# Patient Record
Sex: Female | Born: 1940
Health system: Southern US, Community
[De-identification: ages and names within clinical notes are randomized; demographics above are authoritative.]

## PROBLEM LIST (undated history)

## (undated) DIAGNOSIS — K219 Gastro-esophageal reflux disease without esophagitis: Secondary | ICD-10-CM

## (undated) DIAGNOSIS — E785 Hyperlipidemia, unspecified: Secondary | ICD-10-CM

## (undated) DIAGNOSIS — F32A Depression, unspecified: Secondary | ICD-10-CM

## (undated) DIAGNOSIS — D649 Anemia, unspecified: Secondary | ICD-10-CM

## (undated) DIAGNOSIS — M810 Age-related osteoporosis without current pathological fracture: Secondary | ICD-10-CM

## (undated) DIAGNOSIS — I10 Essential (primary) hypertension: Secondary | ICD-10-CM

## (undated) DIAGNOSIS — D689 Coagulation defect, unspecified: Secondary | ICD-10-CM

## (undated) DIAGNOSIS — R195 Other fecal abnormalities: Secondary | ICD-10-CM

## (undated) DIAGNOSIS — H269 Unspecified cataract: Secondary | ICD-10-CM

## (undated) DIAGNOSIS — I739 Peripheral vascular disease, unspecified: Secondary | ICD-10-CM

## (undated) DIAGNOSIS — F17200 Nicotine dependence, unspecified, uncomplicated: Secondary | ICD-10-CM

## (undated) DIAGNOSIS — F329 Major depressive disorder, single episode, unspecified: Secondary | ICD-10-CM

## (undated) HISTORY — DX: Other fecal abnormalities: R19.5

## (undated) HISTORY — DX: Depression, unspecified: F32.A

## (undated) HISTORY — DX: Anemia, unspecified: D64.9

## (undated) HISTORY — DX: Nicotine dependence, unspecified, uncomplicated: F17.200

## (undated) HISTORY — DX: Major depressive disorder, single episode, unspecified: F32.9

## (undated) HISTORY — DX: Coagulation defect, unspecified: D68.9

## (undated) HISTORY — DX: Gastro-esophageal reflux disease without esophagitis: K21.9

## (undated) HISTORY — DX: Peripheral vascular disease, unspecified: I73.9

## (undated) HISTORY — DX: Essential (primary) hypertension: I10

## (undated) HISTORY — DX: Age-related osteoporosis without current pathological fracture: M81.0

## (undated) HISTORY — DX: Hyperlipidemia, unspecified: E78.5

## (undated) HISTORY — DX: Unspecified cataract: H26.9

---
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
## 1977-04-24 HISTORY — PX: TUBAL LIGATION: SHX77

## 2009-04-04 ENCOUNTER — Emergency Department (HOSPITAL_COMMUNITY): Admission: EM | Admit: 2009-04-04 | Discharge: 2009-04-04 | Payer: Self-pay | Admitting: Emergency Medicine

## 2009-04-04 LAB — CONVERTED CEMR LAB
BUN: 12 mg/dL
Chloride: 96 meq/L
Glucose, Bld: 107 mg/dL
Potassium: 3.1 meq/L
Sodium: 133 meq/L

## 2009-04-14 ENCOUNTER — Ambulatory Visit: Payer: Self-pay | Admitting: Internal Medicine

## 2009-04-14 DIAGNOSIS — Z9189 Other specified personal risk factors, not elsewhere classified: Secondary | ICD-10-CM | POA: Insufficient documentation

## 2009-04-14 DIAGNOSIS — I10 Essential (primary) hypertension: Secondary | ICD-10-CM | POA: Insufficient documentation

## 2009-04-14 DIAGNOSIS — D649 Anemia, unspecified: Secondary | ICD-10-CM | POA: Insufficient documentation

## 2009-04-14 DIAGNOSIS — F17200 Nicotine dependence, unspecified, uncomplicated: Secondary | ICD-10-CM

## 2009-05-03 ENCOUNTER — Ambulatory Visit: Payer: Self-pay | Admitting: Internal Medicine

## 2009-05-03 DIAGNOSIS — E785 Hyperlipidemia, unspecified: Secondary | ICD-10-CM | POA: Insufficient documentation

## 2009-05-03 LAB — CONVERTED CEMR LAB
BUN: 8 mg/dL (ref 6–23)
CO2: 30 meq/L (ref 19–32)
Calcium: 9.1 mg/dL (ref 8.4–10.5)
Creatinine, Ser: 0.7 mg/dL (ref 0.4–1.2)
Direct LDL: 170.8 mg/dL
GFR calc non Af Amer: 87.75 mL/min (ref >60)
Glucose, Bld: 104 mg/dL — ABNORMAL HIGH (ref 70–99)
HDL: 53.9 mg/dL (ref >39.00)
Sodium: 136 meq/L (ref 135–145)
TSH: 1.43 microintl units/mL (ref 0.35–5.50)

## 2009-08-09 ENCOUNTER — Ambulatory Visit: Payer: Self-pay | Admitting: Internal Medicine

## 2009-08-09 LAB — CONVERTED CEMR LAB
ALT: 15 units/L (ref 0–35)
Albumin: 3.9 g/dL (ref 3.5–5.2)
Basophils Relative: 0.8 % (ref 0.0–3.0)
Bilirubin, Direct: 0.1 mg/dL (ref 0.0–0.3)
Cholesterol: 326 mg/dL — ABNORMAL HIGH (ref 0–200)
Eosinophils Absolute: 0.1 10*3/uL (ref 0.0–0.7)
HDL: 62.4 mg/dL (ref >39.00)
Hemoglobin: 12.9 g/dL (ref 12.0–15.0)
Lymphocytes Relative: 26.2 % (ref 12.0–46.0)
MCHC: 34.7 g/dL (ref 30.0–36.0)
Monocytes Relative: 6.2 % (ref 3.0–12.0)
Neutro Abs: 6.2 10*3/uL (ref 1.4–7.7)
Neutrophils Relative %: 66 % (ref 43.0–77.0)
RBC: 4.29 M/uL (ref 3.87–5.11)
Total Protein: 8.6 g/dL — ABNORMAL HIGH (ref 6.0–8.3)
Triglycerides: 107 mg/dL (ref 0.0–149.0)
VLDL: 21.4 mg/dL (ref 0.0–40.0)
WBC: 9.4 10*3/uL (ref 4.5–10.5)

## 2009-08-16 ENCOUNTER — Telehealth: Payer: Self-pay | Admitting: Internal Medicine

## 2009-11-08 ENCOUNTER — Ambulatory Visit: Payer: Self-pay | Admitting: Internal Medicine

## 2009-11-08 LAB — CONVERTED CEMR LAB
AST: 22 units/L (ref 0–37)
Albumin: 4 g/dL (ref 3.5–5.2)
Alkaline Phosphatase: 48 units/L (ref 39–117)
Cholesterol: 174 mg/dL (ref 0–200)
HDL: 59.6 mg/dL (ref >39.00)
LDL Cholesterol: 100 mg/dL — ABNORMAL HIGH (ref 0–99)
Total Protein: 7.7 g/dL (ref 6.0–8.3)
Triglycerides: 72 mg/dL (ref 0.0–149.0)

## 2009-12-13 ENCOUNTER — Telehealth: Payer: Self-pay | Admitting: Internal Medicine

## 2010-02-07 ENCOUNTER — Ambulatory Visit: Payer: Self-pay | Admitting: Internal Medicine

## 2010-02-07 LAB — CONVERTED CEMR LAB
Cholesterol: 189 mg/dL (ref 0–200)
Creatinine, Ser: 0.9 mg/dL (ref 0.4–1.2)
HDL: 73.7 mg/dL (ref >39.00)

## 2010-02-11 ENCOUNTER — Encounter: Payer: Self-pay | Admitting: Internal Medicine

## 2010-02-11 ENCOUNTER — Telehealth: Payer: Self-pay | Admitting: Internal Medicine

## 2010-03-01 ENCOUNTER — Ambulatory Visit: Payer: Self-pay

## 2010-03-01 ENCOUNTER — Encounter: Payer: Self-pay | Admitting: Internal Medicine

## 2010-03-08 ENCOUNTER — Ambulatory Visit: Payer: Self-pay | Admitting: Cardiovascular Disease

## 2010-03-08 ENCOUNTER — Encounter: Payer: Self-pay | Admitting: Cardiovascular Disease

## 2010-05-10 ENCOUNTER — Ambulatory Visit
Admission: RE | Admit: 2010-05-10 | Discharge: 2010-05-10 | Payer: Self-pay | Source: Home / Self Care | Attending: Internal Medicine | Admitting: Internal Medicine

## 2010-05-24 NOTE — Assessment & Plan Note (Signed)
Summary: pvd/pt had lea on 03-01-10/sl   Visit Type:  new pt visit Primary [facility] Will:  Newt Lukes MD   History of Present Illness: 70 yo WF with history of tobacco abuse, HTN and hyperlipidemia who is here today for evaluation of PAD. She was seen by Dr. Felicity Coyer and c/o pain in both legs with walking. The pain is described as a cramping in the calf muscles and goes away quickly with rest. She can walk for several blocks before her legs begin to ache. She has no rest pain or  ulcerations on her feet.   Current Medications (verified): 1)  Amlodipine Besylate 5 Mg Tabs (Amlodipine Besylate) .Marland Kitchen.. 1 By Mouth Once Daily 2)  Aspirin 81 Mg Tabs (Aspirin) .Marland Kitchen.. 1 By Mouth Once Daily 3)  Lipitor 20 Mg Tabs (Atorvastatin Calcium) .... Take 1 By Mouth Once Daily  Allergies (verified): No Known Drug Allergies  Past History:  Past Medical History: Reviewed history from 02/07/2010 and no changes required. 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         Bilateral leg pain with ambulation.   Vital Signs:  Patient profile:   70 year old female Height:      66 inches Weight:      96.12 pounds BMI:     15.57 Pulse rate:   93 / minute Pulse rhythm:   irregular BP sitting:   154 / 80  (left arm) Cuff size:   large  Vitals Entered By: Danielle Rankin, CMA (March 08, 2010 3:49 PM)  Physical Exam  General:  General: Well developed, well nourished, NAD HEENT: OP clear, mucus membranes moist SKIN: warm, dry Neuro: No focal deficits Musculoskeletal: Muscle strength 5/5 all ext Psychiatric: Mood and affect normal Neck: No JVD, no carotid bruits, no thyromegaly, no lymphadenopathy. Lungs:Clear bilaterally, no wheezes, rhonci, crackles CV: RRR no murmurs, gallops rubs Abdomen: soft, NT, ND, BS present Extremities: No edema, pulses trace right DP/PT, non-palpable left DP/PT.    EKG  Procedure date:  03/08/2010  Findings:      NSR, rate 90 bpm. Non-specific ST changes.   Arterial Doppler  Procedure date:  03/01/2010  Findings:      Right ABI 0.76. Right CFA, SFA and popliteal waveforms brisk and biphasic. There is moderate disease in the right mid SFA. PTA and peroneal  waveforms brisk and biphasic.   Left ABI 0.60. Left SFA occluded near ostium and reconstitutes distally via collaterals. Left PTA and peroneal waveforms damped and monophasic.   Impression & Recommendations:  Problem # 1:  PVD (ICD-443.9) Her non-invasive studies suggest chronic total occlusion of the left SFA with a long segment of total  occlusion, reconstitution above the knee. There is moderate disease in the right SFA. Her ABI are moderately reduced. I have discussed medical management with the patient and have offered a diagnostic angiogram. She will consder this. I have explained to the patient the options including PTA with stents versus atherectomy vs conservative  therapy. Will start Pletal 50 mg by mouth two times a day and continue ASA. She will call us after the new year to discuss arranging the procedure.   Other Orders: EKG w/ Interpretation (93000)  Patient Instructions: 1)  Your physician recommends that you schedule a follow-up appointment in: 6 months 2)  Your physician has recommended you make the following change in your medication: START PLETAL 50mg  by mouth two times a day. Prescriptions: PLETAL 50 MG TABS (CILOSTAZOL) take one tablet by mouth two times a day.  #60 x 8   Entered by:   Whitney Maeola Sarah RN   Authorized by:   Verne Carrow, MD   Signed by:   Ellender Hose RN on 03/08/2010   Method used:   Electronically to        [facility]. [phone]* (retail)       [address]       [place], [place]  [address]       Ph: [phone]       Fax: [phone]   RxID:   [id number]

## 2010-05-24 NOTE — Assessment & Plan Note (Signed)
Summary: 3 MOS F/U // CD   Vital Signs:  Patient profile:   70 year old female Height:      66 inches (167.64 cm) Weight:      96.12 pounds (43.69 kg) O2 Sat:      97 % on Room air Temp:     97.6 degrees F (36.44 degrees C) oral Pulse rate:   68 / minute BP sitting:   152 / 98  (left arm) Cuff size:   regular  Vitals Entered By: Orlan Leavens (August 09, 2009 11:01 AM)  O2 Flow:  Room air CC: 3 month follow-up Is Patient Diabetic? No Pain Assessment Patient in pain? no        Primary Care Provider:  Newt Lukes MD  CC:  3 month follow-up.  History of Present Illness: here for 3 month followup -  1) HTN - reports compliance with ongoing medical treatment and no changes in medication dose or frequency. denies adverse side effects related to current therapy.   2) dyslipidemia - never been told of this dx prior to this year - feels diet is already low fat, heart healthy- taking fish oil as rec.   3) smoking- understands need to quit and is working on same. does not feel meds or self-help classese would help, attributes cost of smoking as motivating factor in desire to quit    Clinical Review Panels:  Prevention   Last Mammogram:  normal (04/24/1978)   Last Pap Smear:  normal (04/24/1989)  Lipid Management   Cholesterol:  246 (05/03/2009)   HDL (good cholesterol):  53.90 (05/03/2009)  Complete Metabolic Panel   Glucose:  104 (05/03/2009)   Sodium:  136 (05/03/2009)   Potassium:  3.5 (05/03/2009)   Chloride:  95 (05/03/2009)   CO2:  30 (05/03/2009)   BUN:  8 (05/03/2009)   Creatinine:  0.7 (05/03/2009)   Calcium:  9.1 (05/03/2009)   Current Medications (verified): 1)  Lisinopril 20 Mg Tabs (Lisinopril) .Marland Kitchen.. 1 By Mouth Once Daily 2)  Amlodipine Besylate 5 Mg Tabs (Amlodipine Besylate) .Marland Kitchen.. 1 By Mouth Once Daily 3)  Toprol Xl 50 Mg Xr24h-Tab (Metoprolol Succinate) .Marland Kitchen.. 1 By Mouth Once Daily 4)  Fish Oil 1000 Mg Caps (Omega-3 Fatty Acids) .Marland Kitchen.. 1 By Mouth  Two Times A Day 5)  Aspirin 81 Mg Tabs (Aspirin) .Marland Kitchen.. 1 By Mouth Once Daily  Allergies (verified): No Known Drug Allergies  Past History:  Past Medical History: Anemia-NOS Hypertension dyslipidemia  Review of Systems  The patient denies weight loss, chest pain, syncope, dyspnea on exertion, peripheral edema, headaches, hemoptysis, and abdominal pain.    Physical Exam  General:  thin, alert, well-developed, well-nourished, and cooperative to examination.    Lungs:  normal respiratory effort, no intercostal retractions or use of accessory muscles; normal breath sounds bilaterally - no crackles and no wheezes.    Heart:  normal rate, regular rhythm, no murmur, and no rub. BLE without edema. Neurologic:  alert & oriented X3 and cranial nerves II-XII symetrically intact.  strength normal in all extremities, sensation intact to light touch, and gait normal. speech fluent without dysarthria or aphasia; follows commands with good comprehension.  Psych:  Oriented X3, memory intact for recent and remote, normally interactive, good eye contact, not anxious appearing, not depressed appearing, and not agitated.      Impression & Recommendations:  Problem # 1:  HYPERTENSION (ICD-401.9)  subop control - even home BP reviewed: SBP140-160s add diuretic to current regimen - new  e-rx for combo pill with ACEI Her updated medication list for this problem includes:    Lisinopril-hydrochlorothiazide 20-25 Mg Tabs (Lisinopril-hydrochlorothiazide) .Marland Kitchen... 1 by mouth once daily    Amlodipine Besylate 5 Mg Tabs (Amlodipine besylate) .Marland Kitchen... 1 by mouth once daily    Toprol Xl 50 Mg Xr24h-tab (Metoprolol succinate) .Marland Kitchen... 1 by mouth once daily  BP today: 152/98 Prior BP: 158/98 (05/03/2009)  Labs Reviewed: K+: 3.5 (05/03/2009) Creat: : 0.7 (05/03/2009)   Chol: 246 (05/03/2009)   HDL: 53.90 (05/03/2009)   TG: 85.0 (05/03/2009)  Orders: Prescription Created Electronically [phone]) Tobacco use cessation  intermediate 3-10 minutes ([address])  Problem # 2:  DYSLIPIDEMIA (ICD-272.4) labs reviewed -  given multitude of risk factos, feel pt would likely benefit from statin tx - check again now to compare to "pre omega 3" levels and add statin of still lev - same explained to pt who understands and agrees Orders: TLB-Lipid Panel (80061-LIPID) TLB-Hepatic/Liver Function Pnl (80076-HEPATIC) Tobacco use cessation intermediate 3-10 minutes (09811)    HDL:53.90 (05/03/2009)  Chol:246 (05/03/2009)  Trig:85.0 (05/03/2009)  Problem # 3:  SMOKER (ICD-305.1) 5 minutes today spent on patient education regarding the unhealthy effects of continued tobacco abuse and encouragment of cessation including medical options available to help patient to quit smoking.  Orders: TLB-Hepatic/Liver Function Pnl (80076-HEPATIC) Tobacco use cessation intermediate 3-10 minutes (91478)  Problem # 4:  ANEMIA-NOS (ICD-285.9) hx same - check CBC now, esp with smoking Orders: TLB-CBC Platelet - w/Differential (85025-CBCD) Tobacco use cessation intermediate 3-10 minutes (29562)  TSH: 1.43 (05/03/2009)  Complete Medication List: 1)  Lisinopril-hydrochlorothiazide 20-25 Mg Tabs (Lisinopril-hydrochlorothiazide) .Marland Kitchen.. 1 by mouth once daily 2)  Amlodipine Besylate 5 Mg Tabs (Amlodipine besylate) .Marland Kitchen.. 1 by mouth once daily 3)  Toprol Xl 50 Mg Xr24h-tab (Metoprolol succinate) .Marland Kitchen.. 1 by mouth once daily 4)  Fish Oil 1000 Mg Caps (Omega-3 fatty acids) .Marland Kitchen.. 1 by mouth two times a day 5)  Aspirin 81 Mg Tabs (Aspirin) .Marland Kitchen.. 1 by mouth once daily  Patient Instructions: 1)  it was good to see you today.  2)  test(s) ordered today - your results will be posted on the phone tree for review in 48-72 hours from the time of test completion; call [phone] and enter your 9 digit MRN (listed above on this page, just below your name); if any changes need to be made or there are abnormal results, you will be contacted directly.  3)  If  cholesterol still high, will consider adding cholesterol medication -  4)  will add diuretic to your blood pressure regimen for improved blood control - your prescriptions have been electronically submitted to your pharmacy. Please take as directed. Contact our office if you believe you're having problems with the medication(s).  5)  Please schedule a follow-up appointment in 3-4 months to recheck labs and blood pressure, sooner if problems.  6)  keep working on giving up cigarettes like you are doing Prescriptions: TOPROL XL 50 MG XR24H-TAB (METOPROLOL SUCCINATE) 1 by mouth once daily  #30 x 3   Entered and Authorized by:   Newt Lukes MD   Signed by:   Newt Lukes MD on 08/09/2009   Method used:   Electronically to        [facility]. [phone]* (retail)       [address]       [place], [place]  [address]  Ph: [phone]       Fax: [phone]   RxID:   [id number] AMLODIPINE BESYLATE 5 MG TABS (AMLODIPINE BESYLATE) 1 by mouth once daily  #30 x 3   Entered and Authorized by:   Newt Lukes MD   Signed by:   Newt Lukes MD on 08/09/2009   Method used:   Electronically to        [facility]. [phone]* (retail)       [address]       [place], [place]  [address]       Ph: [phone]       Fax: [phone]   RxID:   [id number] LISINOPRIL-HYDROCHLOROTHIAZIDE 20-25 MG TABS (LISINOPRIL-HYDROCHLOROTHIAZIDE) 1 by mouth once daily  #30 x 3   Entered and Authorized by:   Newt Lukes MD   Signed by:   Newt Lukes MD on 08/09/2009   Method used:   Electronically to        [facility]. [phone]* (retail)       [address]       [place], [place]  [address]       Ph: [phone]       Fax: [phone]   RxID:   [id number]

## 2010-05-24 NOTE — Assessment & Plan Note (Signed)
Summary: 3-4 MTH FU  STC   Vital Signs:  Patient profile:   70 year old female Height:      66 inches (167.64 cm) Weight:      98.4 pounds (44.73 kg) BMI:     15.94 O2 Sat:      98 % on Room air Temp:     97.8 degrees F (36.56 degrees C) oral Pulse rate:   118 / minute BP sitting:   132 / 90  (left arm) Cuff size:   regular  Vitals Entered By: Orlan Leavens RMA (February 07, 2010 10:47 AM)  O2 Flow:  Room air CC: 3 month follow-up Is Patient Diabetic? No Pain Assessment Patient in pain? no      Comments pt need clarification on meds. she states only been taking the lipitor & lisinopril. (Per phone note 12/13/09 was told to hold metorolo, & lisonpril)   Primary Care Provider:  Newt Lukes MD  CC:  3 month follow-up.  History of Present Illness: here for 3 month followup -  1) HTN - reports compliance with ongoing medical treatment and no changes in medication dose or frequency since last phone call (see EMR - holding lisiniproil/hct and toprol). denies adverse side effects related to current therapy.  no cp, no ha, no edema  2) dyslipidemia - reports compliance with ongoing medical treatment - statin began 07/2009 and no changes in medication dose or frequency. denies adverse side effects related to current therapy. - feels diet is already low fat, heart healthy- taking fish oil as rec.   3) smoking- understands need to quit and is working on same. does not feel meds or self-help classese would help, attributes cost of smoking as motivating factor in desire to quit  c/o claudication in both legs, "only when i walk fast" pain is described as "ache" - relieved with cessation of walking/rest ?attributes to lipitor    Clinical Review Panels:  Lipid Management   Cholesterol:  174 (11/08/2009)   LDL (bad choesterol):  100 (11/08/2009)   HDL (good cholesterol):  59.60 (11/08/2009)  CBC   WBC:  9.4 (08/09/2009)   RBC:  4.29 (08/09/2009)   Hgb:  12.9 (08/09/2009)  Hct:  37.1 (08/09/2009)   Platelets:  331.0 (08/09/2009)   MCV  86.4 (08/09/2009)   MCHC  34.7 (08/09/2009)   RDW  16.3 (08/09/2009)   PMN:  66.0 (08/09/2009)   Lymphs:  26.2 (08/09/2009)   Monos:  6.2 (08/09/2009)   Eosinophils:  0.8 (08/09/2009)   Basophil:  0.8 (08/09/2009)  Complete Metabolic Panel   Glucose:  104 (05/03/2009)   Sodium:  136 (05/03/2009)   Potassium:  3.5 (05/03/2009)   Chloride:  95 (05/03/2009)   CO2:  30 (05/03/2009)   BUN:  8 (05/03/2009)   Creatinine:  0.7 (05/03/2009)   Albumin:  4.0 (11/08/2009)   Total Protein:  7.7 (11/08/2009)   Calcium:  9.1 (05/03/2009)   Total Bili:  0.2 (11/08/2009)   Alk Phos:  48 (11/08/2009)   SGPT (ALT):  13 (11/08/2009)   SGOT (AST):  22 (11/08/2009)   Current Medications (verified): 1)  Lisinopril-Hydrochlorothiazide 20-25 Mg Tabs (Lisinopril-Hydrochlorothiazide) .Marland Kitchen.. 1 By Mouth Once Daily 2)  Amlodipine Besylate 5 Mg Tabs (Amlodipine Besylate) .Marland Kitchen.. 1 By Mouth Once Daily 3)  Toprol Xl 50 Mg Xr24h-Tab (Metoprolol Succinate) .Marland Kitchen.. 1 By Mouth Once Daily 4)  Fish Oil 1000 Mg Caps (Omega-3 Fatty Acids) .Marland Kitchen.. 1 By Mouth Two Times A Day 5)  Aspirin 81  Mg Tabs (Aspirin) .Marland Kitchen.. 1 By Mouth Once Daily 6)  Lipitor 20 Mg Tabs (Atorvastatin Calcium) .... 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         c/o claudication with exertion, bilateral legs  Physical Exam  General:  thin, alert, well-developed, well-nourished, and cooperative to examination.    Lungs:  normal respiratory effort, no intercostal retractions or use of accessory muscles; normal breath sounds bilaterally - no  crackles and no wheezes.    Heart:  normal rate, regular rhythm, no murmur, and no rub. BLE without edema.   Impression & Recommendations:  Problem # 1:  CLAUDICATION (ICD-443.9) multiple risks factors for PAD - check ABI now encouraged to cont BP and chol med and cont smoking cessation efforts! Orders: LE Arterial Doppler/ABI (Le arterial doppler) Tobacco use cessation intermediate 3-10 minutes ([address])  Problem # 2:  HYPERTENSION (ICD-401.9)  intol of Bbloc - fatigue and feels BP too low on combo tx - cont amlodipine and monitor The following medications were removed from the medication list:    Lisinopril-hydrochlorothiazide 20-25 Mg Tabs (Lisinopril-hydrochlorothiazide) .Marland Kitchen... 1 by mouth once daily    Toprol Xl 50 Mg Xr24h-tab (Metoprolol succinate) .Marland Kitchen... 1 by mouth once daily Her updated medication list for this problem includes:    Amlodipine Besylate 5 Mg Tabs (Amlodipine besylate) .Marland Kitchen... 1 by mouth once daily  Orders: LE Arterial Doppler/ABI (Le arterial doppler) TLB-Creatinine, Blood (82565-CREA)  BP today: 132/90 Prior BP: 150/82 (11/08/2009)  Labs Reviewed: K+: 3.5 (05/03/2009) Creat: : 0.7 (05/03/2009)   Chol: 174 (11/08/2009)   HDL: 59.60 (11/08/2009)   LDL: 100 (11/08/2009)   TG: 72.0 (11/08/2009)  Problem # 3:  DYSLIPIDEMIA (ICD-272.4)  Her updated medication list for this problem includes:    Lipitor 20 Mg Tabs (Atorvastatin calcium) .Marland Kitchen... Take 1 by mouth once daily  Orders: LE Arterial Doppler/ABI (Le arterial doppler) TLB-Lipid Panel (80061-LIPID)  begun on meds 08/16/09 - tol lipitor 20mg  once daily - recheck labs now - adjust if needed -   Labs Reviewed: SGOT: 21 (08/09/2009)   SGPT: 15 (08/09/2009)   HDL:62.40 (08/09/2009), 53.90 (05/03/2009)  Chol:326 (08/09/2009), 246 (05/03/2009)  Trig:107.0 (08/09/2009), 85.0 (05/03/2009)  Problem # 4:  SMOKER (ICD-305.1)  Orders: LE Arterial Doppler/ABI (Le arterial doppler) Tobacco use cessation  intermediate 3-10 minutes ([address]406)  5 minutes today spent on patient education regarding the unhealthy effects of continued tobacco abuse and encouragment of cessation including medical options available to help patient to quit smoking.   Complete Medication List: 1)  Amlodipine Besylate 5 Mg Tabs (Amlodipine besylate) .Marland Kitchen.. 1 by mouth once daily 2)  Fish Oil 1000 Mg Caps (Omega-3 fatty acids) .Marland Kitchen.. 1 by mouth two times a day 3)  Aspirin 81 Mg Tabs (Aspirin) .Marland Kitchen.. 1 by mouth once daily 4)  Lipitor 20 Mg Tabs (Atorvastatin calcium) .... Take 1 by mouth once daily  Patient Instructions: 1)  it was good to see you today.  2)  test(s) ordered today - your results will be called to you in 48-72 hours re: any changes need to be made or there are abnormal results, you will be contacted directly.  3)  we'll make referral  for ABI to look at blood flow in your legs. Our office will contact you regarding this appointment once made.  4)  Please schedule a follow-up appointment in 3 months to recheck blood pressure, call sooner if problems.  5)  keep working on giving up cigarettes like you are doing! Prescriptions: AMLODIPINE BESYLATE 5 MG TABS (AMLODIPINE BESYLATE) 1 by mouth once daily  #30 x 6   Entered and Authorized by:   Newt Lukes MD   Signed by:   Newt Lukes MD on 02/07/2010   Method used:   Electronically to        [facility]. [phone]* (retail)       [address]       [place], [place]  [address]       Ph: [phone]       Fax: [phone]   RxID:   [id number]    Orders Added: 1)  LE Arterial Doppler/ABI [Le arterial doppler] 2)  Est. Patient Level IV [44010] 3)  TLB-Lipid Panel [80061-LIPID] 4)  TLB-Creatinine, Blood [82565-CREA] 5)  Tobacco use cessation intermediate 3-10 minutes [99406]

## 2010-05-24 NOTE — Progress Notes (Signed)
Summary: LE study ?  Phone Note Call from Patient Call back at [facility] Phone [phone]   Summary of Call: pt request call back re: appt on tues.  She states she had oral surgery on Wednesday. She states she is in pain and on pain meds but she feels real spaced out and is unsure if she can keep the appt on Tues for  LE study.  She wants to know if Dr. Felicity Coyer thinks it can wait? Initial call taken by: Lanier Prude, [facility]),  February 11, 2010 2:46 PM  Follow-up for Phone Call        we can reschedule ABI for following week if needed - just coordinate with LeB vasc lab on this - thanks Follow-up by: Newt Lukes MD,  February 11, 2010 5:17 PM  Additional Follow-up for Phone Call Additional follow up Details #1::        pt informed and I gave her number to Cardiology to R/S appt. Additional Follow-up by: Lanier Prude, [facility]),  February 11, 2010 5:37 PM

## 2010-05-24 NOTE — Progress Notes (Signed)
Summary: Med Se?  Phone Note Call from Patient Call back at Home Phone [phone]   Caller: Patient Summary of Call: pt called stating that she has been experiencing lightheadedness since starting new medications. please advise Initial call taken by: Margaret Pyle, CMA,  August 16, 2009 1:20 PM  Follow-up for Phone Call        lipitor does not usually cause this SE, but pt may cut pill in half for 1 week then resume whole pill to see if this helps her symptoms - thanks Follow-up by: Newt Lukes MD,  August 16, 2009 2:08 PM  Additional Follow-up for Phone Call Additional follow up Details #1::        pt informed, will call back in one week Additional Follow-up by: Margaret Pyle, CMA,  August 16, 2009 3:56 PM

## 2010-05-24 NOTE — Assessment & Plan Note (Signed)
Summary: 2 - 3 wk f/u #/cd   Vital Signs:  Patient profile:   70 year old female Height:      66 inches (167.64 cm) Weight:      92.0 pounds (41.82 kg) O2 Sat:      97 % on Room air Temp:     97.0 degrees F (36.11 degrees C) oral Pulse rate:   80 / minute BP sitting:   158 / 98  (left arm) Cuff size:   regular  Vitals Entered By: Orlan Leavens (May 03, 2009 10:58 AM)  O2 Flow:  Room air CC: 3 week f/u Is Patient Diabetic? No Pain Assessment Patient in pain? no        Primary Care Provider:  Newt Lukes MD  CC:  3 week f/u.  History of Present Illness: HTN f/u - reports compliance with ongoing medical treatment and no changes in medication dose or frequency. denies adverse side effects related to current therapy.   Current Medications (verified): 1)  Lisinopril 10 Mg Tabs (Lisinopril) .... Take 1 By Mouth Qd 2)  Amlodipine Besylate 5 Mg Tabs (Amlodipine Besylate) .Marland Kitchen.. 1 By Mouth Once Daily 3)  Toprol Xl 50 Mg Xr24h-Tab (Metoprolol Succinate) .Marland Kitchen.. 1 By Mouth Once Daily  Allergies (verified): No Known Drug Allergies  Past History:  Past Medical History: Last updated: 04/14/2009 Anemia-NOS Hypertension  Physical Exam  General:  thin, alert, well-developed, well-nourished, and cooperative to examination.    Lungs:  normal respiratory effort, no intercostal retractions or use of accessory muscles; normal breath sounds bilaterally - no crackles and no wheezes.    Heart:  normal rate, regular rhythm, no murmur, and no rub. BLE without edema.   Impression & Recommendations:  Problem # 1:  HYPERTENSION (ICD-401.9) improved but not yet at goal - inc ACEI dose (keep other same d/t risk edema or fatigue) check labs - recheck 3mos Her updated medication list for this problem includes:    Lisinopril 20 Mg Tabs (Lisinopril) .Marland Kitchen... 1 by mouth once daily    Amlodipine Besylate 5 Mg Tabs (Amlodipine besylate) .Marland Kitchen... 1 by mouth once daily    Toprol Xl 50 Mg  Xr24h-tab (Metoprolol succinate) .Marland Kitchen... 1 by mouth once daily  Orders: TLB-BMP (Basic Metabolic Panel-BMET) (80048-METABOL) TLB-Lipid Panel (80061-LIPID) TLB-TSH (Thyroid Stimulating Hormone) (16109-UEA) Prescription Created Electronically [phone])  BP today: 158/98 Prior BP: 190/130 (04/14/2009)  Labs Reviewed: K+: 3.1 (04/04/2009) Creat: : 0.77 (04/04/2009)     Complete Medication List: 1)  Lisinopril 20 Mg Tabs (Lisinopril) .Marland Kitchen.. 1 by mouth once daily 2)  Amlodipine Besylate 5 Mg Tabs (Amlodipine besylate) .Marland Kitchen.. 1 by mouth once daily 3)  Toprol Xl 50 Mg Xr24h-tab (Metoprolol succinate) .Marland Kitchen.. 1 by mouth once daily  Patient Instructions: 1)  blood pressure is improved but not yet at goal - 2)  will increase your lisinopril dose to 20mg  once daily - other medications to remain the same - 3)  your new prescription has been electronically submitted to your pharmacy. Please take as directed. Contact our office if you believe you're having problems with the medication(s).  4)  test(s) ordered today - your results will be posted on the phone tree for review in 48-72 hours from the time of test completion; call [phone] and enter your 9 digit MRN (listed above on this page, just below your name); if any changes need to be made or there are abnormal results, you will be contacted directly.  5)  Please schedule a follow-up appointment  in 3months, sooner if problems.  Prescriptions: LISINOPRIL 20 MG TABS (LISINOPRIL) 1 by mouth once daily  #30 x 3   Entered and Authorized by:   Newt Lukes MD   Signed by:   Newt Lukes MD on 05/03/2009   Method used:   Electronically to        [facility]. [phone]* (retail)       [address]       [place], [place]  [address]       Ph: [phone]       Fax: [phone]   RxID:   [id number]

## 2010-05-24 NOTE — Miscellaneous (Signed)
Summary: Orders Update  Clinical Lists Changes  Orders: Added new Test order of Arterial Duplex Lower Extremity (Arterial Duplex Low) - Signed 

## 2010-05-24 NOTE — Assessment & Plan Note (Signed)
Summary: 3 mos f/u // # / cd   Vital Signs:  Patient profile:   70 year old female Height:      66 inches (167.64 cm) Weight:      96.0 pounds (43.64 kg) O2 Sat:      92 % on Room air Temp:     98.6 degrees F (37.00 degrees C) oral Pulse rate:   82 / minute BP sitting:   150 / 82  (left arm) Cuff size:   regular  Vitals Entered By: Orlan Leavens (November 08, 2009 10:54 AM)  O2 Flow:  Room air CC: 3 month follow-up Is Patient Diabetic? No Pain Assessment Patient in pain? no      Comments Need refills on meds. also pt states she only takes 1/2 lipitor   Primary Care Provider:  Newt Lukes MD  CC:  3 month follow-up.  History of Present Illness: here for 3 month followup -  1) HTN - reports compliance with ongoing medical treatment and no changes in medication dose or frequency. denies adverse side effects related to current therapy.  no cp, no ha, no edema  2) dyslipidemia - reports compliance with ongoing medical treatment began 07/2009 and no changes in medication dose or frequency. denies adverse side effects related to current therapy. - feels diet is already low fat, heart healthy- taking fish oil as rec.   3) smoking- understands need to quit and is working on same. does not feel meds or self-help classese would help, attributes cost of smoking as motivating factor in desire to quit    Allergies: No Known Drug Allergies  Past History:  Past Medical History: Anemia-NOS Hypertension dyslipidemia  Review of Systems  The patient denies fever, weight loss, muscle weakness, and difficulty walking.    Physical Exam  General:  thin, alert, well-developed, well-nourished, and cooperative to examination.    Lungs:  normal respiratory effort, no intercostal retractions or use of accessory muscles; normal breath sounds bilaterally - no crackles and no wheezes.    Heart:  normal rate, regular rhythm, no murmur, and no rub. BLE without edema. Msk:  No deformity or  scoliosis noted of thoracic or lumbar spine.     Impression & Recommendations:  Problem # 1:  DYSLIPIDEMIA (ICD-272.4)  begun on meds last OV 3 months ago 08/16/09 - tol lipitor 20mg  once daily - recheck labs now - adjust if needed - ?inc dose vs change to crestor -- await lab results Her updated medication list for this problem includes:    Lipitor 40 Mg Tabs (Atorvastatin calcium) .Marland Kitchen... 1/2 by mouth at bedtime  Orders: TLB-Lipid Panel (80061-LIPID)  Labs Reviewed: SGOT: 21 (08/09/2009)   SGPT: 15 (08/09/2009)   HDL:62.40 (08/09/2009), 53.90 (05/03/2009)  Chol:326 (08/09/2009), 246 (05/03/2009)  Trig:107.0 (08/09/2009), 85.0 (05/03/2009)  Problem # 2:  HYPERTENSION (ICD-401.9) Assessment: Unchanged  Her updated medication list for this problem includes:    Lisinopril-hydrochlorothiazide 20-25 Mg Tabs (Lisinopril-hydrochlorothiazide) .Marland Kitchen... 1 by mouth once daily    Amlodipine Besylate 5 Mg Tabs (Amlodipine besylate) .Marland Kitchen... 1 by mouth once daily    Toprol Xl 50 Mg Xr24h-tab (Metoprolol succinate) .Marland Kitchen... 1 by mouth once daily  BP today: 150/82 Prior BP: 152/98 (08/09/2009)  Labs Reviewed: K+: 3.5 (05/03/2009) Creat: : 0.7 (05/03/2009)   Chol: 326 (08/09/2009)   HDL: 62.40 (08/09/2009)   TG: 107.0 (08/09/2009)  Problem # 3:  SMOKER (ICD-305.1)  Complete Medication List: 1)  Lisinopril-hydrochlorothiazide 20-25 Mg Tabs (Lisinopril-hydrochlorothiazide) .Marland Kitchen.. 1 by mouth  once daily 2)  Amlodipine Besylate 5 Mg Tabs (Amlodipine besylate) .Marland Kitchen.. 1 by mouth once daily 3)  Toprol Xl 50 Mg Xr24h-tab (Metoprolol succinate) .Marland Kitchen.. 1 by mouth once daily 4)  Fish Oil 1000 Mg Caps (Omega-3 fatty acids) .Marland Kitchen.. 1 by mouth two times a day 5)  Aspirin 81 Mg Tabs (Aspirin) .Marland Kitchen.. 1 by mouth once daily 6)  Lipitor 40 Mg Tabs (Atorvastatin calcium) .... 1/2 by mouth at bedtime  Other Orders: TLB-Hepatic/Liver Function Pnl (80076-HEPATIC)  Patient Instructions: 1)  it was good to see you today.  2)   test(s) ordered today - your results will be called to you in 48-72 hours re: any changes need to be made or there are abnormal results, you will be contacted directly.  3)  If cholesterol still high, will consider changing or increasing dose of cholesterol medication -   4)  Please schedule a follow-up appointment in 3-4 months to recheck labs and blood pressure, sooner if problems.  5)  keep working on giving up cigarettes like you are doing

## 2010-05-24 NOTE — Progress Notes (Signed)
Summary: BP?  Phone Note Call from Patient Call back at Home Phone [phone]   Caller: Patient Summary of Call: Pt called stating that she experienced low BP Friday evening 12/10/09 (80/40) that caused her to feel dizzy. Pt states that she stopped taking Lisinopril, Metoprolol and Amlodipine. Pt says that since she stopped taking medications she feels much better, dizziness has resolved but her BP this morning was elevated at 145/90. Pt is requesting MD advisement on which medicine may have caused symptoms and which one should she re-start, please advise. Initial call taken by: Margaret Pyle, CMA,  December 13, 2009 9:08 AM  Follow-up for Phone Call        rec resume amlodipine, hold metoprolol as Bbloc more likely to cause her symptoms; also hold lisinopril for now - call if SBP>130s - thanks Follow-up by: Newt Lukes MD,  December 13, 2009 12:05 PM  Additional Follow-up for Phone Call Additional follow up Details #1::        Pt informed Additional Follow-up by: Margaret Pyle, CMA,  December 13, 2009 1:34 PM

## 2010-05-26 NOTE — Assessment & Plan Note (Signed)
Summary: 3 MONTH FOLLOW UP-LB   Vital Signs:  Patient profile:   70 year old female Height:      66 inches (167.64 cm) Weight:      96.8 pounds (44.00 kg) O2 Sat:      98 % on Room air Temp:     97.3 degrees F (36.28 degrees C) oral Pulse rate:   107 / minute BP sitting:   148 / 92  (left arm) Cuff size:   regular  Vitals Entered By: Orlan Leavens RMA (May 10, 2010 11:00 AM)  O2 Flow:  Room air CC: 3 month follow-up Is Patient Diabetic? No Pain Assessment Patient in pain? no        Primary Care Provider:  Newt Lukes MD  CC:  3 month follow-up.  History of Present Illness: here for 3 month followup -  1) HTN - reports compliance with ongoing medical treatment and no changes in medication dose or frequency since last phone call (see EMR - holding lisiniproil/hct and toprol). denies adverse side effects related to current therapy.  no cp, no ha, no edema  2) dyslipidemia - reports compliance with ongoing medical treatment - statin began 07/2009 and no changes in medication dose or frequency. denies adverse side effects related to current therapy. - feels diet is already low fat, heart healthy- taking fish oil as rec.   3) smoking- understands need to quit and is working on same. does not feel meds or self-help classese would help, attributes cost of smoking as motivating factor in desire to quit - down to 4 cig/d  4) PAD/PVD - abn ABI 02/2010 - seen by periph cards who rec intervention but pt wanted to try med tx 1st - on pletal; much improved but continued claudication in both legs "only when i walk fast" -pain relieved with cessation of walking/rest  Clinical Review Panels:  Lipid Management   Cholesterol:  189 (02/07/2010)   LDL (bad choesterol):  97 (02/07/2010)   HDL (good cholesterol):  73.70 (02/07/2010)  CBC   WBC:  9.4 (08/09/2009)   RBC:  4.29 (08/09/2009)   Hgb:  12.9 (08/09/2009)   Hct:  37.1 (08/09/2009)   Platelets:  331.0 (08/09/2009)   MCV   86.4 (08/09/2009)   MCHC  34.7 (08/09/2009)   RDW  16.3 (08/09/2009)   PMN:  66.0 (08/09/2009)   Lymphs:  26.2 (08/09/2009)   Monos:  6.2 (08/09/2009)   Eosinophils:  0.8 (08/09/2009)   Basophil:  0.8 (08/09/2009)  Complete Metabolic Panel   Glucose:  104 (05/03/2009)   Sodium:  136 (05/03/2009)   Potassium:  3.5 (05/03/2009)   Chloride:  95 (05/03/2009)   CO2:  30 (05/03/2009)   BUN:  8 (05/03/2009)   Creatinine:  0.9 (02/07/2010)   Albumin:  4.0 (11/08/2009)   Total Protein:  7.7 (11/08/2009)   Calcium:  9.1 (05/03/2009)   Total Bili:  0.2 (11/08/2009)   Alk Phos:  48 (11/08/2009)   SGPT (ALT):  13 (11/08/2009)   SGOT (AST):  22 (11/08/2009)   Current Medications (verified): 1)  Amlodipine Besylate 5 Mg Tabs (Amlodipine Besylate) .Marland Kitchen.. 1 By Mouth Once Daily 2)  Aspirin 81 Mg Tabs (Aspirin) .Marland Kitchen.. 1 By Mouth Once Daily 3)  Lipitor 20 Mg Tabs (Atorvastatin Calcium) .... Take 1 By Mouth Once Daily 4)  Pletal 50 Mg Tabs (Cilostazol) .... Take One Tablet By Mouth Two Times A Day.  Allergies (verified): No Known Drug Allergies  Past History:  Past Medical  History: Anemia-NOS   Hypertension dyslipidemia PVD/PAD - ABI 02/2010: R 0.76, L 0.60  MD roster: card - mcalhany  Review of Systems  The patient denies weight loss, chest pain, headaches, and abdominal pain.    Physical Exam  General:  thin, alert, well-developed, well-nourished, and cooperative to examination.    Lungs:  normal respiratory effort, no intercostal retractions or use of accessory muscles; normal breath sounds bilaterally - no crackles and no wheezes.    Heart:  normal rate, regular rhythm, no murmur, and no rub. BLE without edema. Psych:  Oriented X3, memory intact for recent and remote, normally interactive, good eye contact, not anxious appearing, not depressed appearing, and not agitated.      Impression & Recommendations:  Problem # 1:  PVD (ICD-443.9) seen by mcalhany 02/2010 for same - ABI  03/01/10 reviewed - considering intervention if needed but improved pain symptoms on pletal  cont same and recheck ABI in 08/2009, f/u cards as planned work on risk factor modification  Problem # 2:  DYSLIPIDEMIA (ICD-272.4) intol of 40mg  due to myalgia in past cont 20mg  lipitor for now as tol well Her updated medication list for this problem includes:    Lipitor 20 Mg Tabs (Atorvastatin calcium) .Marland Kitchen... Take 1 by mouth once daily  Labs Reviewed: SGOT: 22 (11/08/2009)   SGPT: 13 (11/08/2009)   HDL:73.70 (02/07/2010), 59.60 (11/08/2009)  LDL:97 (02/07/2010), 100 (16/01/9603)  Chol:189 (02/07/2010), 174 (11/08/2009)  Trig:91.0 (02/07/2010), 72.0 (11/08/2009)  Problem # 3:  HYPERTENSION (ICD-401.9)  add ARB to current meds - prior Bbloc caused fatigue Her updated medication list for this problem includes:    Amlodipine Besylate 5 Mg Tabs (Amlodipine besylate) .Marland Kitchen... 1 by mouth once daily    Losartan Potassium 50 Mg Tabs (Losartan potassium) .Marland Kitchen... 1 by mouth once daily  BP today: 148/92 Prior BP: 154/80 (03/08/2010)  Labs Reviewed: K+: 3.5 (05/03/2009) Creat: : 0.9 (02/07/2010)   Chol: 189 (02/07/2010)   HDL: 73.70 (02/07/2010)   LDL: 97 (02/07/2010)   TG: 91.0 (02/07/2010)  Orders: Prescription Created Electronically [phone])  Problem # 4:  SMOKER (ICD-305.1) 5 minutes today spent on patient education regarding the unhealthy effects of continued tobacco abuse and encouragment of cessation including medical options available to help patient to quit smoking.   Complete Medication List: 1)  Amlodipine Besylate 5 Mg Tabs (Amlodipine besylate) .Marland Kitchen.. 1 by mouth once daily 2)  Aspirin 81 Mg Tabs (Aspirin) .Marland Kitchen.. 1 by mouth once daily 3)  Lipitor 20 Mg Tabs (Atorvastatin calcium) .... Take 1 by mouth once daily 4)  Pletal 50 Mg Tabs (Cilostazol) .... Take one tablet by mouth two times a day. 5)  Losartan Potassium 50 Mg Tabs (Losartan potassium) .Marland Kitchen.. 1 by mouth once daily  Patient  Instructions: 1)  it was good to see you today.  2)  start losartan in additon to amlodipine for blood pressure - your prescription has been electronically submitted to your pharmacy. Please take as directed. Contact our office if you believe you're having problems with the medication(s).  3)  Plan followup ABI in 6 months to look at blood flow in your legs. Schedule with Dr. Clifton James as discussed 4)  Please schedule a follow-up appointment in 3 months to recheck blood pressure, call sooner if problems. will check cholesterol next visit 5)  keep working on giving up cigarettes like you are doing! Good job Prescriptions: LOSARTAN POTASSIUM 50 MG TABS (LOSARTAN POTASSIUM) 1 by mouth once daily  #30 x 6  Entered and Authorized by:   Newt Lukes MD   Signed by:   Newt Lukes MD on 05/10/2010   Method used:   Electronically to        [facility]. [phone]* (retail)       [address]       [place], [place]  [address]       Ph: [phone]       Fax: [phone]   RxID:   [id number]    Orders Added: 1)  Prescription Created Electronically [[id number]] 2)  Est. Patient Level IV [44010]

## 2010-07-13 ENCOUNTER — Encounter: Payer: Self-pay | Admitting: *Deleted

## 2010-07-26 LAB — BASIC METABOLIC PANEL
Calcium: 9.1 mg/dL (ref 8.4–10.5)
GFR calc Af Amer: >60 mL/min (ref >60)
GFR calc non Af Amer: >60 mL/min (ref >60)
Glucose, Bld: 107 mg/dL — ABNORMAL HIGH (ref 70–99)
Potassium: 3.1 mEq/L — ABNORMAL LOW (ref 3.5–5.1)
Sodium: 133 mEq/L — ABNORMAL LOW (ref 135–145)

## 2010-08-09 ENCOUNTER — Ambulatory Visit: Payer: Self-pay | Admitting: Internal Medicine

## 2010-08-15 ENCOUNTER — Telehealth: Payer: Self-pay

## 2010-08-15 ENCOUNTER — Ambulatory Visit (INDEPENDENT_AMBULATORY_CARE_PROVIDER_SITE_OTHER): Payer: Medicare Other | Admitting: Internal Medicine

## 2010-08-15 ENCOUNTER — Encounter: Payer: Self-pay | Admitting: Internal Medicine

## 2010-08-15 ENCOUNTER — Other Ambulatory Visit (INDEPENDENT_AMBULATORY_CARE_PROVIDER_SITE_OTHER): Payer: Medicare Other

## 2010-08-15 DIAGNOSIS — R000 Tachycardia, unspecified: Secondary | ICD-10-CM

## 2010-08-15 DIAGNOSIS — I10 Essential (primary) hypertension: Secondary | ICD-10-CM

## 2010-08-15 DIAGNOSIS — E785 Hyperlipidemia, unspecified: Secondary | ICD-10-CM

## 2010-08-15 DIAGNOSIS — I739 Peripheral vascular disease, unspecified: Secondary | ICD-10-CM

## 2010-08-15 LAB — CBC WITH DIFFERENTIAL/PLATELET
Basophils Absolute: 0.1 10*3/uL (ref 0.0–0.1)
Basophils Relative: 1.2 % (ref 0.0–3.0)
Eosinophils Absolute: 0.1 10*3/uL (ref 0.0–0.7)
Eosinophils Relative: 0.7 % (ref 0.0–5.0)
HCT: 36.7 % (ref 36.0–46.0)
Hemoglobin: 12.5 g/dL (ref 12.0–15.0)
MCV: 89 fl (ref 78.0–100.0)
Monocytes Relative: 7.7 % (ref 3.0–12.0)
Platelets: 300 10*3/uL (ref 150.0–400.0)
RBC: 4.12 Mil/uL (ref 3.87–5.11)
WBC: 8.3 10*3/uL (ref 4.5–10.5)

## 2010-08-15 LAB — TSH: TSH: 1.84 u[IU]/mL (ref 0.35–5.50)

## 2010-08-15 MED ORDER — HYDROCHLOROTHIAZIDE 12.5 MG PO TABS: 12.5000 mg | ORAL_TABLET | Freq: Every day | ORAL | Status: DC

## 2010-08-15 NOTE — Assessment & Plan Note (Signed)
Seen by mcalhany 02/2010 for same -  ABI 03/01/10 reviewed - pt considering intervention if needed but improved pain symptoms on pletal  cont same and recheck ABI in 08/2009 and cards as planned Continue to work on risk factor modification

## 2010-08-15 NOTE — Assessment & Plan Note (Signed)
On statin since 07/2009 - The current medical regimen is effective;  continue present plan and medications.

## 2010-08-15 NOTE — Assessment & Plan Note (Signed)
Home log reviewed - largely uncontrolled - Intolerant of ACEI due to cough, intolerant of lopressor due to fatigue Add HCTZ to current medications and titrate as needed

## 2010-08-15 NOTE — Telephone Encounter (Signed)
Please call patient - normal lab results. No medication changes recommended. Thanks.   Lab Results  Component Value Date   WBC 8.3 08/15/2010   HGB 12.5 08/15/2010   HCT 36.7 08/15/2010   MCV 89.0 08/15/2010   PLT 300.0 08/15/2010   Lab Results  Component Value Date   TSH 1.84 08/15/2010   Also agree with rx'd HCTZ as tab so pt could take 1/2 tab if needed - to return drug to pharmacy as instructed by triage - thanks

## 2010-08-15 NOTE — Progress Notes (Signed)
  Subjective:    Patient ID: Jennifer [name], female    DOB: 1940/08/06, 70 y.o.   MRN: [id number]  HPI Here for follow up - reviewed chronic medical issues:  HTN - reports compliance with ongoing medical treatment and no changes in medication dose or frequency since last phone call (see EMR - holding lisinipril/hct and toprol). denies adverse side effects related to current therapy.  no cp, no ha, no edema  dyslipidemia - reports compliance with ongoing medical treatment - statin began 07/2009 and no changes in medication dose or frequency. denies adverse side effects related to current therapy. - feels diet is already low fat, heart healthy- taking fish oil as rec.   smoking- understands need to quit and is working on same. does not feel meds or self-help classese would help, attributes cost of smoking as motivating factor in desire to quit - down to 4 cig/d  PAD/PVD - abn ABI 02/2010 - seen by periph cards who rec intervention but pt wanted to try med tx 1st - on pletal; much improved but continued claudication in both legs "only when i walk fast" -pain relieved with cessation of walking/rest  Past Medical History  Diagnosis Date  . SMOKER   . Peripheral vascular disease, unspecified   . ANEMIA-NOS   . DYSLIPIDEMIA   . HYPERTENSION    Review of Systems  Constitutional: Negative for unexpected weight change.  Respiratory: Negative for shortness of breath.   Cardiovascular: Negative for chest pain.  Neurological: Negative for headaches.       Objective:   Physical Exam  Constitutional: She is oriented to person, place, and time. She appears well-developed and well-nourished. No distress.       Thin  Neck: Normal range of motion. Neck supple. No thyromegaly present.  Cardiovascular: Normal rate, regular rhythm and normal heart sounds.   Pulmonary/Chest: Effort normal and breath sounds normal. No respiratory distress. She has no wheezes.  Neurological: She is alert and oriented to  person, place, and time. No cranial nerve deficit.  Psychiatric: She has a normal mood and affect. Her behavior is normal.      Lab Results  Component Value Date   WBC 9.4 08/09/2009   HGB 12.9 08/09/2009   HCT 37.1 08/09/2009   PLT 331.0 08/09/2009   CHOL 189 02/07/2010   TRIG 91.0 02/07/2010   HDL 73.70 02/07/2010   LDLDIRECT 232.1 08/09/2009   ALT 13 11/08/2009   AST 22 11/08/2009   NA 136 05/03/2009   K 3.5 05/03/2009   CL 95* 05/03/2009   CREATININE 0.9 02/07/2010   BUN 8 05/03/2009   CO2 30 05/03/2009   TSH 1.43 05/03/2009     Assessment & Plan:  See problem list. Medications and labs reviewed today.

## 2010-08-15 NOTE — Telephone Encounter (Signed)
Pt called stating that she was Rx'd 1/2 tab of

## 2010-08-15 NOTE — Telephone Encounter (Signed)
Notified pt with results.Marland KitchenMarland Kitchen4/23/12@6 :36pm/LMB

## 2010-08-15 NOTE — Patient Instructions (Signed)
It was good to see you today. Medications reviewed, will make the following changes at this time. Add HCTZ for blood pressure as directed to current medications Your prescription(s) have been submitted to your pharmacy. Please take as directed and contact our office if you believe you are having problem(s) with the medication(s). Test(s) ordered today. Your results will be called to you after review (48-72hours after test completion). If any changes need to be made, you will be notified at that time. Please schedule followup in 3-4 months for blood pressure and medication review, call sooner if problems. Continue to work on giving up those last cigarettes!!!

## 2010-08-15 NOTE — Assessment & Plan Note (Signed)
Check labs now rule out anemia or thyroid issues - intolerant of Bbloc due to fatigue

## 2010-08-29 ENCOUNTER — Other Ambulatory Visit: Payer: Self-pay | Admitting: Internal Medicine

## 2010-09-04 ENCOUNTER — Other Ambulatory Visit: Payer: Self-pay | Admitting: Internal Medicine

## 2010-09-16 ENCOUNTER — Other Ambulatory Visit: Payer: Self-pay | Admitting: Internal Medicine

## 2010-09-16 DIAGNOSIS — I739 Peripheral vascular disease, unspecified: Secondary | ICD-10-CM

## 2010-09-20 ENCOUNTER — Encounter (INDEPENDENT_AMBULATORY_CARE_PROVIDER_SITE_OTHER): Payer: Medicare Other | Admitting: *Deleted

## 2010-09-20 ENCOUNTER — Other Ambulatory Visit: Payer: Self-pay | Admitting: *Deleted

## 2010-09-20 DIAGNOSIS — I739 Peripheral vascular disease, unspecified: Secondary | ICD-10-CM

## 2010-09-23 ENCOUNTER — Encounter: Payer: Self-pay | Admitting: Internal Medicine

## 2010-10-07 ENCOUNTER — Encounter: Payer: Self-pay | Admitting: Cardiovascular Disease

## 2010-10-07 ENCOUNTER — Ambulatory Visit (INDEPENDENT_AMBULATORY_CARE_PROVIDER_SITE_OTHER): Payer: Medicare Other | Admitting: Cardiovascular Disease

## 2010-10-07 VITALS — BP 155/90 | HR 93 | Ht 66.0 in | Wt 100.0 lb

## 2010-10-07 DIAGNOSIS — I739 Peripheral vascular disease, unspecified: Secondary | ICD-10-CM

## 2010-10-07 DIAGNOSIS — F17200 Nicotine dependence, unspecified, uncomplicated: Secondary | ICD-10-CM

## 2010-10-07 NOTE — Assessment & Plan Note (Signed)
Stable. Continue ASA and Pletal. Continue walking. Complete smoking cessation encouraged. She wishes to stop.

## 2010-10-07 NOTE — Assessment & Plan Note (Signed)
Pt wishes to stop smoking.

## 2010-10-07 NOTE — Progress Notes (Signed)
History of Present Illness :70 yo WF with history of PAD,  tobacco abuse, HTN and hyperlipidemia who is here today for PV follow up. I saw her as a new pt in November 2011 for evaluation of PAD. She had c/o pain in both legs with walking at that time. The pain was described as a cramping in the calf muscles and goes away quickly with rest. She had no rest pain or  ulcerations on her feet. Non-invasive studies showed chronic total occlusion of the left SFA with a long segment of total occlusion, reconstitution above the knee. There is moderate disease in the right SFA. Her ABI are moderately reduced at 0.76 on the right and 0.60 on the left. I recommended that we pursue a diagnostic angiogram if she wished but she refused. Most recent non-invasive studies May 29,2012 with right ABI 0.89 and left ABI 0.65. She has been feeling great. Her legs are feeling much better. She has pain in both legs when she walks a long ways. No rest pain or ulcerations. No chest pain or SOB.    Past Medical History  Diagnosis Date  . SMOKER   . Peripheral vascular disease, unspecified   . ANEMIA-NOS   . DYSLIPIDEMIA   . HYPERTENSION     Past Surgical History  Procedure Date  . Tubal ligation 1979  . Tonsillectomy 1946    Current Outpatient Prescriptions  Medication Sig Dispense Refill  . amLODipine (NORVASC) 5 MG tablet TAKE 1 TABLET BY MOUTH EVERY DAY  30 tablet  5  . aspirin 81 MG tablet Take 81 mg by mouth daily.        Marland Kitchen atorvastatin (LIPITOR) 20 MG tablet TAKE 1 TABLET BY MOUTH DAILY  30 tablet  4  . cilostazol (PLETAL) 50 MG tablet Take 50 mg by mouth 2 (two) times daily.        . hydrochlorothiazide (HYDRODIURIL) 12.5 MG tablet Take 1 tablet (12.5 mg total) by mouth daily.  30 tablet  11  . losartan (COZAAR) 50 MG tablet Take 50 mg by mouth daily.          No Known Allergies  History   Social History  . Marital Status: Single    Spouse Name: N/A    Number of Children: N/A  . Years of Education:  N/A   Occupational History  . Not on file.   Social History Main Topics  . Smoking status: Current Some Day Smoker  . Smokeless tobacco: Not on file   Comment: Single, lives alone. Retired Pensions consultant. Enjoys Agricultural consultant work.  . Alcohol Use: Yes     Social  . Drug Use: No  . Sexually Active: Not on file   Other Topics Concern  . Not on file   Social History Narrative  . No narrative on file    Family History  Problem Relation Age of Onset  . Breast cancer Mother   . Hypertension Brother   . Lung cancer Other   . Hypertension Other     Review of Systems:  As stated in the HPI and otherwise negative.   BP 155/90  Pulse 93  Ht 5\' 6"  (1.676 m)  Wt 100 lb (45.36 kg)  BMI 16.14 kg/m2  Physical Examination: General: Well developed, well nourished, NAD HEENT: OP clear, mucus membranes moist SKIN: warm, dry. No rashes. Neuro: No focal deficits Musculoskeletal: Muscle strength 5/5 all ext Psychiatric: Mood and affect normal Neck: No JVD, no carotid bruits, no thyromegaly, no lymphadenopathy.  Lungs:Clear bilaterally, no wheezes, rhonci, crackles Cardiovascular: Regular rate and rhythm. No murmurs, gallops or rubs. Abdomen:Soft. Bowel sounds present. Non-tender.  Extremities: No lower extremity edema. Pulses are 1 + in the  Right  DP/PT and non-palpable in the left DP/PT.   ABI 09/20/10: Right ABI 0.89, Left ABI 0.65.

## 2010-11-16 ENCOUNTER — Encounter: Payer: Self-pay | Admitting: Internal Medicine

## 2010-11-16 ENCOUNTER — Ambulatory Visit (INDEPENDENT_AMBULATORY_CARE_PROVIDER_SITE_OTHER): Payer: Medicare Other | Admitting: Internal Medicine

## 2010-11-16 DIAGNOSIS — I739 Peripheral vascular disease, unspecified: Secondary | ICD-10-CM

## 2010-11-16 DIAGNOSIS — E785 Hyperlipidemia, unspecified: Secondary | ICD-10-CM

## 2010-11-16 DIAGNOSIS — I10 Essential (primary) hypertension: Secondary | ICD-10-CM

## 2010-11-16 MED ORDER — LOSARTAN POTASSIUM-HCTZ 50-12.5 MG PO TABS: 1.0000 | ORAL_TABLET | Freq: Every day | ORAL | Status: DC

## 2010-11-16 NOTE — Assessment & Plan Note (Signed)
Home log reviewed - largely uncontrolled - Intolerant of ACEI due to cough, intolerant of lopressor due to fatigue Added HCTZ 07/2010 to other medications - will continue titratation as needed - also change to ARB-diuretic combo  BP Readings from Last 3 Encounters:  11/16/10 172/98  10/07/10 155/90  08/15/10 162/100

## 2010-11-16 NOTE — Assessment & Plan Note (Signed)
On statin since 07/2009 -  The current medical regimen is effective;  continue present plan and medications.

## 2010-11-16 NOTE — Assessment & Plan Note (Signed)
Seen by mcalhany 02/2010 for same -  ABI 03/01/10 reviewed - improved on 08/2009 ABI on pletal, less smoking and statin - improved pain symptoms on same Continue to work on risk factor modification and follow up cards as planned

## 2010-11-16 NOTE — Patient Instructions (Addendum)
It was good to see you today. Medications reviewed, will make the following changes at this time. Combine HCTZ with losartan for blood pressure as directed (slight increase in diuretic dose) Your prescription(s) have been submitted to your pharmacy. Please take as directed and contact our office if you believe you are having problem(s) with the medication(s). Please schedule followup in 4 months for blood pressure and medication review, call sooner if problems. Continue to work on giving up those last cigarettes!!!

## 2010-11-17 ENCOUNTER — Encounter: Payer: Self-pay | Admitting: Internal Medicine

## 2010-11-17 NOTE — Progress Notes (Signed)
  Subjective:    Patient ID: Jennifer [name], female    DOB: 12-28-1940, 70 y.o.   MRN: [id number]  HPI  Here for follow up - reviewed chronic medical issues:  HTN - intol of Bblc due to fatigue and ACEI due to cough; reports compliance with ongoing medical treatment and no changes in medication dose or frequency. denies adverse side effects related to current therapy.  no chest pain, no headache, no edema  dyslipidemia - statin began 07/2009 - reports compliance with ongoing medical treatment - no changes in medication dose or frequency. denies adverse side effects related to current therapy. - feels diet is already low fat, heart healthy- taking fish oil as rec.   smoking- understands need to quit and is working on same. does not feel meds or self-help classes would help, attributes cost of smoking as motivating factor in desire to quit - down to 4 cig/d  PAD/PVD - abn ABI 02/2010, improved on f/u ABI 08/2010 - seen by periph cards who initially rec intervention but bega med tx 1st at pt pref - on pletal + ASA; symptoms much improved, less claudication in legs: "only hurts if i walk fast" -pain relieved with cessation of walking/rest  Past Medical History  Diagnosis Date  . SMOKER   . Peripheral vascular disease, unspecified dx 02/2010 ABI  . ANEMIA-NOS   . DYSLIPIDEMIA   . HYPERTENSION    Review of Systems  Constitutional: Negative for unexpected weight change.  Respiratory: Negative for shortness of breath.   Cardiovascular: Negative for chest pain.  Neurological: Negative for headaches.       Objective:   Physical Exam  Constitutional: She is oriented to person, place, and time. She appears well-developed and well-nourished. No distress.       Thin  Neck: Normal range of motion. Neck supple. No thyromegaly present.  Cardiovascular: Normal rate, regular rhythm and normal heart sounds.   Pulmonary/Chest: Effort normal and breath sounds normal. No respiratory distress. She has no  wheezes.  Neurological: She is alert and oriented to person, place, and time. No cranial nerve deficit.  Psychiatric: She has a normal mood and affect. Her behavior is normal.      Lab Results  Component Value Date   WBC 8.3 08/15/2010   HGB 12.5 08/15/2010   HCT 36.7 08/15/2010   PLT 300.0 08/15/2010   CHOL 189 02/07/2010   TRIG 91.0 02/07/2010   HDL 73.70 02/07/2010   LDLDIRECT 232.1 08/09/2009   ALT 13 11/08/2009   AST 22 11/08/2009   NA 136 05/03/2009   K 3.5 05/03/2009   CL 95* 05/03/2009   CREATININE 0.9 02/07/2010   BUN 8 05/03/2009   CO2 30 05/03/2009   TSH 1.84 08/15/2010     Assessment & Plan:  See problem list. Medications and labs reviewed today.

## 2010-12-05 ENCOUNTER — Other Ambulatory Visit: Payer: Self-pay | Admitting: Cardiovascular Disease

## 2010-12-07 MED ORDER — CILOSTAZOL 50 MG PO TABS: 50.0000 mg | ORAL_TABLET | Freq: Two times a day (BID) | ORAL | Status: DC

## 2011-01-27 ENCOUNTER — Other Ambulatory Visit: Payer: Self-pay | Admitting: Internal Medicine

## 2011-03-08 ENCOUNTER — Other Ambulatory Visit: Payer: Self-pay | Admitting: Internal Medicine

## 2011-03-08 ENCOUNTER — Encounter: Payer: Self-pay | Admitting: Internal Medicine

## 2011-03-08 ENCOUNTER — Ambulatory Visit (INDEPENDENT_AMBULATORY_CARE_PROVIDER_SITE_OTHER): Payer: Medicare Other | Admitting: Internal Medicine

## 2011-03-08 ENCOUNTER — Other Ambulatory Visit (INDEPENDENT_AMBULATORY_CARE_PROVIDER_SITE_OTHER): Payer: Medicare Other

## 2011-03-08 VITALS — BP 142/80 | HR 95 | Temp 98.6°F | Ht 63.0 in | Wt 100.4 lb

## 2011-03-08 DIAGNOSIS — E785 Hyperlipidemia, unspecified: Secondary | ICD-10-CM

## 2011-03-08 DIAGNOSIS — I10 Essential (primary) hypertension: Secondary | ICD-10-CM

## 2011-03-08 DIAGNOSIS — I739 Peripheral vascular disease, unspecified: Secondary | ICD-10-CM

## 2011-03-08 DIAGNOSIS — Z13820 Encounter for screening for osteoporosis: Secondary | ICD-10-CM

## 2011-03-08 DIAGNOSIS — F17200 Nicotine dependence, unspecified, uncomplicated: Secondary | ICD-10-CM

## 2011-03-08 DIAGNOSIS — Z23 Encounter for immunization: Secondary | ICD-10-CM

## 2011-03-08 LAB — LIPID PANEL
Cholesterol: 188 mg/dL (ref 0–200)
HDL: 75.8 mg/dL (ref >39.00)
LDL Cholesterol: 99 mg/dL (ref 0–99)
Total CHOL/HDL Ratio: 2
Triglycerides: 68 mg/dL (ref 0.0–149.0)
VLDL: 13.6 mg/dL (ref 0.0–40.0)

## 2011-03-08 NOTE — Assessment & Plan Note (Signed)
Home log reviewed - improved - Intolerant of ACEI due to cough, intolerant of lopressor due to fatigue Added HCTZ 07/2010 to other medications - continue titratation as needed - also change to ARB-diuretic combo  BP Readings from Last 3 Encounters:  03/08/11 142/80  11/16/10 172/98  10/07/10 155/90

## 2011-03-08 NOTE — Assessment & Plan Note (Signed)
On statin since 07/2009 - The current medical regimen is effective;  continue present plan and medications.  

## 2011-03-08 NOTE — Assessment & Plan Note (Signed)
Seen by mcalhany 02/2010 and 6/2055for same -  ABIs reviewed - improved on 08/2009 ABI test on pletal + ASA, less smoking and statin - improved pain symptoms on same Continue to work on risk factor modification and follow up cards as planned

## 2011-03-08 NOTE — Progress Notes (Signed)
  Subjective:    Patient ID: Jennifer [name], female    DOB: [birth year], 70 y.o.   MRN: [id number]  HPI  Here for follow up - reviewed chronic medical issues:  HTN - intolerant of beta-blocker due to fatigue and ACEI due to cough; reports compliance with ongoing medical treatment and no changes in medication dose or frequency. denies adverse side effects related to current therapy.  no chest pain, no headache, no edema  dyslipidemia - statin began 07/2009 - reports compliance with ongoing medical treatment - no changes in medication dose or frequency. denies adverse side effects related to current therapy.   smoking- understands need to quit and is working on same. does not feel meds or self-help classes would help, attributes cost of smoking as motivating factor in desire to quit - down to 4 cig/d  PAD/PVD - abn ABI 02/2010, improved on f/u ABI 08/2010 - follows with periph cards who initially recommended intervention but pt opted for medical management only - on pletal + ASA; symptoms much improved, less claudication in legs: "only hurts if i walk fast" -pain relieved with cessation of walking/rest  Past Medical History  Diagnosis Date  . SMOKER   . Peripheral vascular disease, unspecified dx 02/2010 ABI  . ANEMIA-NOS   . DYSLIPIDEMIA   . HYPERTENSION    Review of Systems  Constitutional: Negative for unexpected weight change.  Respiratory: Negative for shortness of breath.   Cardiovascular: Negative for chest pain.  Neurological: Negative for headaches.       Objective:   Physical Exam  BP 142/80  Pulse 95  Temp(Src) 98.6 F (37 C) (Oral)  Ht 5\' 3"  (1.6 m)  Wt 100 lb 6.4 oz (45.541 kg)  BMI 17.79 kg/m2  SpO2 95% Wt Readings from Last 3 Encounters:  03/08/11 100 lb 6.4 oz (45.541 kg)  11/16/10 101 lb 12.8 oz (46.176 kg)  10/07/10 100 lb (45.36 kg)   Constitutional: She is thin, smells heavily of tobacco - but appears well-developed and well-nourished. No distress.  Neck:  Normal range of motion. Neck supple. No JVD present. No thyromegaly present.  Cardiovascular: Normal rate, regular rhythm and normal heart sounds.  No murmur heard. No BLE edema. Pulmonary/Chest: Effort normal and breath sounds normal. No respiratory distress. She has no wheezes. Psychiatric: She has a normal mood and affect. Her behavior is normal. Judgment and thought content normal.       Lab Results  Component Value Date   WBC 8.3 08/15/2010   HGB 12.5 08/15/2010   HCT 36.7 08/15/2010   PLT 300.0 08/15/2010   CHOL 189 02/07/2010   TRIG 91.0 02/07/2010   HDL 73.70 02/07/2010   LDLDIRECT 232.1 08/09/2009   ALT 13 11/08/2009   AST 22 11/08/2009   NA 136 05/03/2009   K 3.5 05/03/2009   CL 95* 05/03/2009   CREATININE 0.9 02/07/2010   BUN 8 05/03/2009   CO2 30 05/03/2009   TSH 1.84 08/15/2010     Assessment & Plan:  See problem list. Medications and labs reviewed today.

## 2011-03-08 NOTE — Assessment & Plan Note (Signed)
Patient again reminded of importance of cessation and encouragement to consider same

## 2011-03-08 NOTE — Patient Instructions (Addendum)
It was good to see you today. Medications reviewed, no changes at this time. Test(s) ordered today. Your results will be called to you after review (48-72hours after test completion). If any changes need to be made, you will be notified at that time. Flu shot done today we'll make referral for bone density screening . Our office will contact you regarding appointment(s) once made. Please schedule followup in 6 months for blood pressure and medication review, call sooner if problems. Continue to work on giving up those last cigarettes!!!

## 2011-03-21 ENCOUNTER — Other Ambulatory Visit: Payer: Self-pay

## 2011-03-21 MED ORDER — HYDROCORTISONE ACE-PRAMOXINE 1-1 % RE FOAM: 1.0000 | Freq: Two times a day (BID) | RECTAL | Status: DC

## 2011-03-21 NOTE — Telephone Encounter (Signed)
Okay 

## 2011-03-21 NOTE — Telephone Encounter (Signed)
Pt called c/o hemorrhoid flare. Pt is requesting Rx to treat, please advise.

## 2011-03-21 NOTE — Telephone Encounter (Signed)
Pt advised of Rx/pharmacy 

## 2011-03-22 ENCOUNTER — Other Ambulatory Visit: Payer: Self-pay

## 2011-03-22 MED ORDER — LIDOCAINE-HYDROCORTISONE ACE 3-0.5 % RE CREA: 1.0000 | TOPICAL_CREAM | Freq: Two times a day (BID) | RECTAL | Status: DC

## 2011-03-22 NOTE — Telephone Encounter (Signed)
Pt advised or Rx/pharmacy 

## 2011-03-22 NOTE — Telephone Encounter (Signed)
Change Proctofoam to [facility] which has lidocaine pain relief in addition to steroid for healing -erx done

## 2011-03-22 NOTE — Telephone Encounter (Signed)
Pt called stating she is improved with medication for Hemorrhoids but she is still experiencing pain. Pt is requesting Rx for hemorrhoidal pain, please advise.

## 2011-03-23 ENCOUNTER — Other Ambulatory Visit: Payer: Self-pay | Admitting: *Deleted

## 2011-03-23 MED ORDER — LIDOCAINE HCL 2 % EX GEL: CUTANEOUS | Status: DC

## 2011-03-23 NOTE — Telephone Encounter (Signed)
Notified pt md change to lidocaine jelly,,,,03/23/11@1 :56pm/LMB

## 2011-03-23 NOTE — Telephone Encounter (Signed)
Cream that was rx insurance will not cover cost $ 298, pt wanting md to change to something else....03/23/11@11 :02am/LMB

## 2011-03-23 NOTE — Telephone Encounter (Signed)
Lidocaine jelly -use in addition to first prescription for hemorrhoid foam

## 2011-03-24 ENCOUNTER — Telehealth: Payer: Self-pay

## 2011-03-24 MED ORDER — LIDOCAINE-HYDROCORTISONE ACE 3-0.5 % RE CREA: 1.0000 | TOPICAL_CREAM | Freq: Two times a day (BID) | RECTAL | Status: DC

## 2011-03-24 MED ORDER — TRAMADOL HCL 50 MG PO TABS: 50.0000 mg | ORAL_TABLET | Freq: Four times a day (QID) | ORAL | Status: DC | PRN

## 2011-03-24 NOTE — Telephone Encounter (Signed)
Pt advised of Rxs

## 2011-03-24 NOTE — Telephone Encounter (Signed)
Ok to d/c lidocaine, infavor of anamantle HC, and limited ultram prn, still needs OV however

## 2011-03-24 NOTE — Telephone Encounter (Signed)
Pt called stating she is still experiencing an increased amount of pain in reference to hemorrhoids (most recent Rx is not helping). Pt is requesting advisement from MD, OV necessary?

## 2011-03-24 NOTE — Telephone Encounter (Signed)
Pt advised and OV schedule but pt is requesting something for pain. She has tried OTC medications with no alleviation in pain, please advise.

## 2011-03-24 NOTE — Telephone Encounter (Signed)
Yes - OV please - may have fissure or other problem - thanks

## 2011-03-27 ENCOUNTER — Ambulatory Visit (INDEPENDENT_AMBULATORY_CARE_PROVIDER_SITE_OTHER): Payer: Medicare Other | Admitting: Internal Medicine

## 2011-03-27 ENCOUNTER — Ambulatory Visit (INDEPENDENT_AMBULATORY_CARE_PROVIDER_SITE_OTHER): Payer: Medicare Other | Admitting: General Surgery

## 2011-03-27 ENCOUNTER — Encounter (INDEPENDENT_AMBULATORY_CARE_PROVIDER_SITE_OTHER): Payer: Self-pay | Admitting: General Surgery

## 2011-03-27 ENCOUNTER — Encounter: Payer: Self-pay | Admitting: Internal Medicine

## 2011-03-27 VITALS — BP 144/90 | HR 70 | Temp 97.4°F | Resp 16 | Ht 64.0 in | Wt 97.8 lb

## 2011-03-27 DIAGNOSIS — K6289 Other specified diseases of anus and rectum: Secondary | ICD-10-CM

## 2011-03-27 DIAGNOSIS — K649 Unspecified hemorrhoids: Secondary | ICD-10-CM

## 2011-03-27 DIAGNOSIS — K648 Other hemorrhoids: Secondary | ICD-10-CM

## 2011-03-27 DIAGNOSIS — K645 Perianal venous thrombosis: Secondary | ICD-10-CM

## 2011-03-27 DIAGNOSIS — K623 Rectal prolapse: Secondary | ICD-10-CM

## 2011-03-27 NOTE — Progress Notes (Signed)
  Subjective:    Patient ID: Jennifer [name], female    DOB: 01-17-1941, 70 y.o.   MRN: [id number]  HPI  Here for rectal pain, question hemorrhoids vs other Has been treated with topical pramoxine, lidocaine and hydrocortisone> some improvement Onset of symptoms 2 weeks ago Precipitated by spell of diarrhea> bowel movement followed by "something falling out" of bottom Has been unable to reduce swelling or mass with self manipulation History of prior hemorrhoids but has always been able to self reduce Denies bleeding but continued severe pain despite topical treatments  Past Medical History  Diagnosis Date  . SMOKER   . Peripheral vascular disease, unspecified dx 02/2010 ABI  . ANEMIA-NOS   . DYSLIPIDEMIA   . HYPERTENSION     Review of Systems  Gastrointestinal: Positive for blood in stool and rectal pain. Negative for abdominal pain and constipation.  Genitourinary: Negative for urgency, hematuria, vaginal bleeding and pelvic pain.       Objective:   Physical Exam BP 142/62  Pulse 82  Temp(Src) 98.5 F (36.9 C) (Oral)  SpO2 96% Gen: NAD -sister at side Abdomen: Soft, no mass, positive bowel sounds Rectal: Cauliflowered circumferential prolapsed rectal mass without bleeding. Rectal exam with extreme pain but no appreciable fissures or internal hemorrhoids. Unable to reduce and motion caused pain with manipulation       Assessment & Plan:  Rectal pain with question prolapse rectum versus hemorrhoids - need for urgent evaluation reviewed given duration greater than 2 weeks and extreme pain. I spoke with GI provider [place] PA who recommended general surgery consultation. Then spoke with general surgeon Dr. Derrell Lolling regarding same. Patient to be seen today by on call office provider Dr. Carolynne Edouard - arrangements and referral made for same today. Patient to continue topical creams for pain control until further evaluation and diagnosis is clarified by general surgery  Time spent with  pt/family today 30 minutes, greater than 50% time spent counseling patient on rectal pain, possible hemorrhoids versus prolapse and medication review.

## 2011-03-27 NOTE — Patient Instructions (Signed)
It was good to see you today. We're working on arrangements to be seen by general surgery in the next 24 hours - you will be called with this information after it is arranged Until that time, continue pain control and cream treatment as discussed

## 2011-03-27 NOTE — Patient Instructions (Signed)
Bedrest Liquid diet Alternate tucks pads and ice packs Colace stool softener twice a day Pain meds if needed Warm tub soaks couple times a day

## 2011-03-30 ENCOUNTER — Telehealth: Payer: Self-pay | Admitting: *Deleted

## 2011-03-30 MED ORDER — TRAMADOL HCL 50 MG PO TABS: 50.0000 mg | ORAL_TABLET | Freq: Four times a day (QID) | ORAL | Status: AC | PRN

## 2011-03-30 MED ORDER — LIDOCAINE-HYDROCORTISONE ACE 3-0.5 % RE CREA: 1.0000 | TOPICAL_CREAM | Freq: Two times a day (BID) | RECTAL | Status: DC

## 2011-03-30 NOTE — Telephone Encounter (Signed)
Notified pt refills sent to pharmacy...03/30/11@2 :36pm/LMB

## 2011-03-30 NOTE — Telephone Encounter (Signed)
Yes. Okay to refill as requested. Thanks

## 2011-03-30 NOTE — Telephone Encounter (Signed)
Pt is requesting refill of Tramadol and Lidocaine-Hydrocortisone cream-please advise if ok to refill.

## 2011-03-31 NOTE — Progress Notes (Signed)
Subjective:     Patient ID: Jennifer [name], female   DOB: Jul 06, 1940, 70 y.o.   MRN: [id number]  HPI We are asked to see the patient in consultation by Dr. Felicity Coyer to evaluate her for hemorrhoids. The patient is a 70 year old white female who recently had a stomach virus. She had a lot of nausea vomiting and diarrhea associated with it. At that time she developed a large hemorrhoid that was stuck out. She could not reduce it. She has had a lot of pain associated with it. She denies any significant amount of bleeding with it.  Review of Systems  Constitutional: Negative.   HENT: Negative.   Eyes: Negative.   Respiratory: Negative.   Cardiovascular: Negative.   Gastrointestinal: Positive for rectal pain.  Genitourinary: Negative.   Musculoskeletal: Negative.   Skin: Negative.   Neurological: Negative.   Hematological: Negative.   Psychiatric/Behavioral: Negative.        Objective:   Physical Exam  Constitutional: She is oriented to person, place, and time. She appears well-developed and well-nourished.  HENT:  Head: Normocephalic and atraumatic.  Eyes: Conjunctivae and EOM are normal. Pupils are equal, round, and reactive to light.  Neck: Normal range of motion. Neck supple.  Cardiovascular: Normal rate, regular rhythm and normal heart sounds.   Pulmonary/Chest: Effort normal and breath sounds normal.  Abdominal: Soft. Bowel sounds are normal.  Genitourinary:       The patient has a large internal and external severely inflamed hemorrhoids circumferentially. There does not appear to be any tissue necrosis.  Musculoskeletal: Normal range of motion.  Neurological: She is alert and oriented to person, place, and time.  Skin: Skin is warm and dry.  Psychiatric: She has a normal mood and affect. Her behavior is normal.       Assessment:     Large severely inflamed hemorrhoid tissue circumferentially around the rectum    Plan:     At this point my recommendation would be for  bowel rest and alternating with ice packs and Tucks pads to the affected area. I think trying to excise any portion of this will only increase the amount of inflammation and swelling. We will plan to see her back in a week to check her progress.

## 2011-04-03 ENCOUNTER — Encounter (INDEPENDENT_AMBULATORY_CARE_PROVIDER_SITE_OTHER): Payer: Self-pay | Admitting: General Surgery

## 2011-04-03 ENCOUNTER — Ambulatory Visit (INDEPENDENT_AMBULATORY_CARE_PROVIDER_SITE_OTHER): Payer: Medicare Other | Admitting: General Surgery

## 2011-04-03 VITALS — BP 160/96 | HR 60 | Temp 97.8°F | Resp 16 | Ht 64.0 in | Wt 95.0 lb

## 2011-04-03 DIAGNOSIS — K645 Perianal venous thrombosis: Secondary | ICD-10-CM

## 2011-04-03 MED ORDER — HYDROCORTISONE ACE-PRAMOXINE 2.5-1 % RE CREA: TOPICAL_CREAM | Freq: Four times a day (QID) | RECTAL | Status: AC

## 2011-04-03 NOTE — Patient Instructions (Signed)
Try the analpram to hemorrhoid 4 times daily Warm tub soaks Bedrest Alternate ice packs and tucks pads

## 2011-04-04 NOTE — Progress Notes (Signed)
Subjective:     Patient ID: Jennifer [name], female   DOB: Feb 10, 1941, 70 y.o.   MRN: [id number]  HPI The patient is a 70 year old white female who we saw last week with severe inflammation of hemorrhoid tissue circumferentially around the rectum. We have had her on bedrest and ice packs and Tucks pads and she has improved. She is having less pain. Review of Systems     Objective:   Physical Exam On exam she still has circumferential large hemorrhoidal disease but the skin is appearing softer and more normal. Some of the inflammation is definitely coming out of the hemorrhoids. No areas of necrosis.    Assessment:     Severe inflammation of hemorrhoids circumferentially    Plan:     At this point I would like her to continue with bed rest and ice packs and Tucks peds as much as she came in for another few days. I think these hemorrhoids will continue to shrink down. My guess is that in a couple weeks will be able to see what she is truly left with and at that point we can decide about a possible formal hemorrhoidectomy to remove some of the problematic tissue. we will plan to see her back in about 2 weeks

## 2011-04-28 ENCOUNTER — Ambulatory Visit (INDEPENDENT_AMBULATORY_CARE_PROVIDER_SITE_OTHER): Payer: Medicare Other | Admitting: General Surgery

## 2011-04-28 ENCOUNTER — Encounter (INDEPENDENT_AMBULATORY_CARE_PROVIDER_SITE_OTHER): Payer: Self-pay | Admitting: General Surgery

## 2011-04-28 DIAGNOSIS — K645 Perianal venous thrombosis: Secondary | ICD-10-CM

## 2011-04-28 NOTE — Progress Notes (Signed)
Subjective:     Patient ID: Jennifer [name], female   DOB: 27-Aug-1940, 71 y.o.   MRN: [id number]  HPI The patient is a 71 year old white female who has had large internal and external hemorrhoids. She has been managing this conservatively at home and has been improving. She is having significantly less discomfort in her rectum. She is now having a bleeding anymore.  Review of Systems  Constitutional: Positive for unexpected weight change.  HENT: Negative.   Eyes: Negative.   Respiratory: Negative.   Cardiovascular: Negative.   Gastrointestinal: Negative.   Genitourinary: Negative.   Musculoskeletal: Negative.   Skin: Negative.   Neurological: Negative.   Hematological: Negative.   Psychiatric/Behavioral: Negative.        Objective:   Physical Exam  Constitutional: She is oriented to person, place, and time. She appears well-developed and well-nourished.  HENT:  Head: Normocephalic and atraumatic.  Eyes: Conjunctivae and EOM are normal. Pupils are equal, round, and reactive to light.  Neck: Normal range of motion. Neck supple.  Cardiovascular: Normal rate, regular rhythm and normal heart sounds.   Pulmonary/Chest: Effort normal and breath sounds normal.  Abdominal: Soft. Bowel sounds are normal.  Genitourinary:       Large circumferential internal and external hemorrhoids that are present but significantly smaller than they were just a couple weeks ago  Musculoskeletal: Normal range of motion.  Neurological: She is alert and oriented to person, place, and time.  Skin: Skin is warm and dry.  Psychiatric: She has a normal mood and affect. Her behavior is normal.       Assessment:     Large internal and external hemorrhoids    Plan:     At this point she has had some shrinkage of the tissue and the tissue looks a lot healthier. I think she would be a good candidate at this point for an old-fashioned hemorrhoidectomy. I have discussed with her in detail the risks and  benefits the operation the digits as well as some of the technical aspects and she understands and wishes to proceed.

## 2011-05-01 ENCOUNTER — Telehealth: Payer: Self-pay | Admitting: Cardiovascular Disease

## 2011-05-01 ENCOUNTER — Other Ambulatory Visit (INDEPENDENT_AMBULATORY_CARE_PROVIDER_SITE_OTHER): Payer: Self-pay | Admitting: General Surgery

## 2011-05-01 NOTE — Telephone Encounter (Signed)
Spoke with pt. She is scheduled for hemorrhoidectomy on May 18, 2011. She is asking if OK to stop ASA and Cilostazol for week prior to procedure.  Will forward to Dr. Clifton James for review.

## 2011-05-01 NOTE — Telephone Encounter (Signed)
Ok to hold asa and Pletal for her procedure. Thanks,chris

## 2011-05-01 NOTE — Telephone Encounter (Incomplete)
.  Accutane is discussed fully with the patient. It is a very effective drug to treat acne vulgaris but has many significant side effects. Chief among these are teratogenesis, hepatic injury, dyslipidemia and severe drying of the mucous membranes. All of these issues have been discussed in details. Monthly blood tests to monitor lipids and liver functions will be necessary. Expect painful dryness and/or fissuring around the lips, eyes, and other moist areas of the body. Balms may be protective. Contact lens may be too painful to wear temporarily while on this drug. Episodes of significant depression have been reported, including suicidal ideation and attempts in rare cases. It may also cause pseudotumor cerebri and hyperostosis. The patient will report any such changes in mood, depressive symptoms or suicidal thoughts, headaches, joint or bone pains.  Female patients MUST use two simultaneous methods of family planning. Accutane is Category X for pregnancy, meaning it will cause fetal teratogenic malformations, and pregnancy MUST be avoided while on this drug.  The dose is 0.5-1 mg per kg in two divided doses for 15-20 weeks.  After discussion of these important issues, she indicates complete understanding of all of the above, and {is/are:315283::"does"} wish to proceed with accutane therapy.

## 2011-05-01 NOTE — Telephone Encounter (Signed)
New Msg: pt calling wanting to discuss with MD if pt medication cilostazol is a blood thinner. Pt is having a procedure and needs to know if pt takes blood thinner. If so pt needs to discuss with MD about pt not taking medication for one week prior to surgery. Please return pt call to discuss further.

## 2011-05-01 NOTE — Telephone Encounter (Signed)
STAFF MESSAGE RECEIVED RE MS Santino AND CALL IN FOR TRAMADOL/ IT WAS NOT RECEIVED THROUGH [facility]/ I CALLED [facility] [address] PER ORDER OF DR. Karie Schwalbe. CORNETT FOR TRAMADOL #30 1 Q 6HRS PRN PAIN. [phone]/ PT AWARE/GY

## 2011-05-01 NOTE — Telephone Encounter (Signed)
Pt notified. She is aware to resume after surgery when OK with surgeon.

## 2011-05-01 NOTE — Telephone Encounter (Signed)
RX FOR TRAMADOL 50MG  #30 CALLED TO [facility] [address] PER V.O. DR. T. CORNETT/GY/PT AWARE

## 2011-05-05 ENCOUNTER — Encounter (HOSPITAL_COMMUNITY): Payer: Self-pay

## 2011-05-09 ENCOUNTER — Ambulatory Visit (INDEPENDENT_AMBULATORY_CARE_PROVIDER_SITE_OTHER)
Admission: RE | Admit: 2011-05-09 | Discharge: 2011-05-09 | Disposition: A | Discharge status: Home / Self Care | Payer: Medicare Other | Source: Ambulatory Visit | Attending: Internal Medicine | Admitting: Internal Medicine

## 2011-05-09 DIAGNOSIS — Z13820 Encounter for screening for osteoporosis: Secondary | ICD-10-CM

## 2011-05-11 ENCOUNTER — Encounter (HOSPITAL_COMMUNITY)
Admission: RE | Admit: 2011-05-11 | Discharge: 2011-05-11 | Disposition: A | Discharge status: Home / Self Care | Payer: Medicare Other | Source: Ambulatory Visit | Attending: General Surgery | Admitting: General Surgery

## 2011-05-11 ENCOUNTER — Ambulatory Visit (HOSPITAL_COMMUNITY)
Admission: RE | Admit: 2011-05-11 | Discharge: 2011-05-11 | Disposition: A | Discharge status: Home / Self Care | Payer: Medicare Other | Source: Ambulatory Visit | Attending: General Surgery | Admitting: General Surgery

## 2011-05-11 ENCOUNTER — Other Ambulatory Visit: Payer: Self-pay

## 2011-05-11 ENCOUNTER — Encounter (HOSPITAL_COMMUNITY): Payer: Self-pay

## 2011-05-11 DIAGNOSIS — R9431 Abnormal electrocardiogram [ECG] [EKG]: Secondary | ICD-10-CM | POA: Insufficient documentation

## 2011-05-11 DIAGNOSIS — I10 Essential (primary) hypertension: Secondary | ICD-10-CM | POA: Insufficient documentation

## 2011-05-11 DIAGNOSIS — Z01810 Encounter for preprocedural cardiovascular examination: Secondary | ICD-10-CM | POA: Insufficient documentation

## 2011-05-11 DIAGNOSIS — M4120 Other idiopathic scoliosis, site unspecified: Secondary | ICD-10-CM | POA: Insufficient documentation

## 2011-05-11 DIAGNOSIS — Z01818 Encounter for other preprocedural examination: Secondary | ICD-10-CM | POA: Insufficient documentation

## 2011-05-11 DIAGNOSIS — Z01812 Encounter for preprocedural laboratory examination: Secondary | ICD-10-CM | POA: Insufficient documentation

## 2011-05-11 LAB — BASIC METABOLIC PANEL
CO2: 28 mEq/L (ref 19–32)
Calcium: 9.1 mg/dL (ref 8.4–10.5)
GFR calc non Af Amer: 86 mL/min — ABNORMAL LOW (ref >90)
Glucose, Bld: 86 mg/dL (ref 70–99)
Potassium: 2.5 mEq/L — CL (ref 3.5–5.1)
Sodium: 136 mEq/L (ref 135–145)

## 2011-05-11 LAB — CBC
Hemoglobin: 12.1 g/dL (ref 12.0–15.0)
MCH: 29.2 pg (ref 26.0–34.0)
Platelets: 357 10*3/uL (ref 150–400)
RBC: 4.15 MIL/uL (ref 3.87–5.11)

## 2011-05-11 LAB — SURGICAL PCR SCREEN
MRSA, PCR: NEGATIVE
Staphylococcus aureus: NEGATIVE

## 2011-05-11 IMAGING — CR DG CHEST 2V
3 series · 3 of 3 positions shown · non-contrast
Comparison: None.

CLINICAL DATA: Hypertension.  Preoperative films.

CHEST - 2 VIEW

[w chest pa]
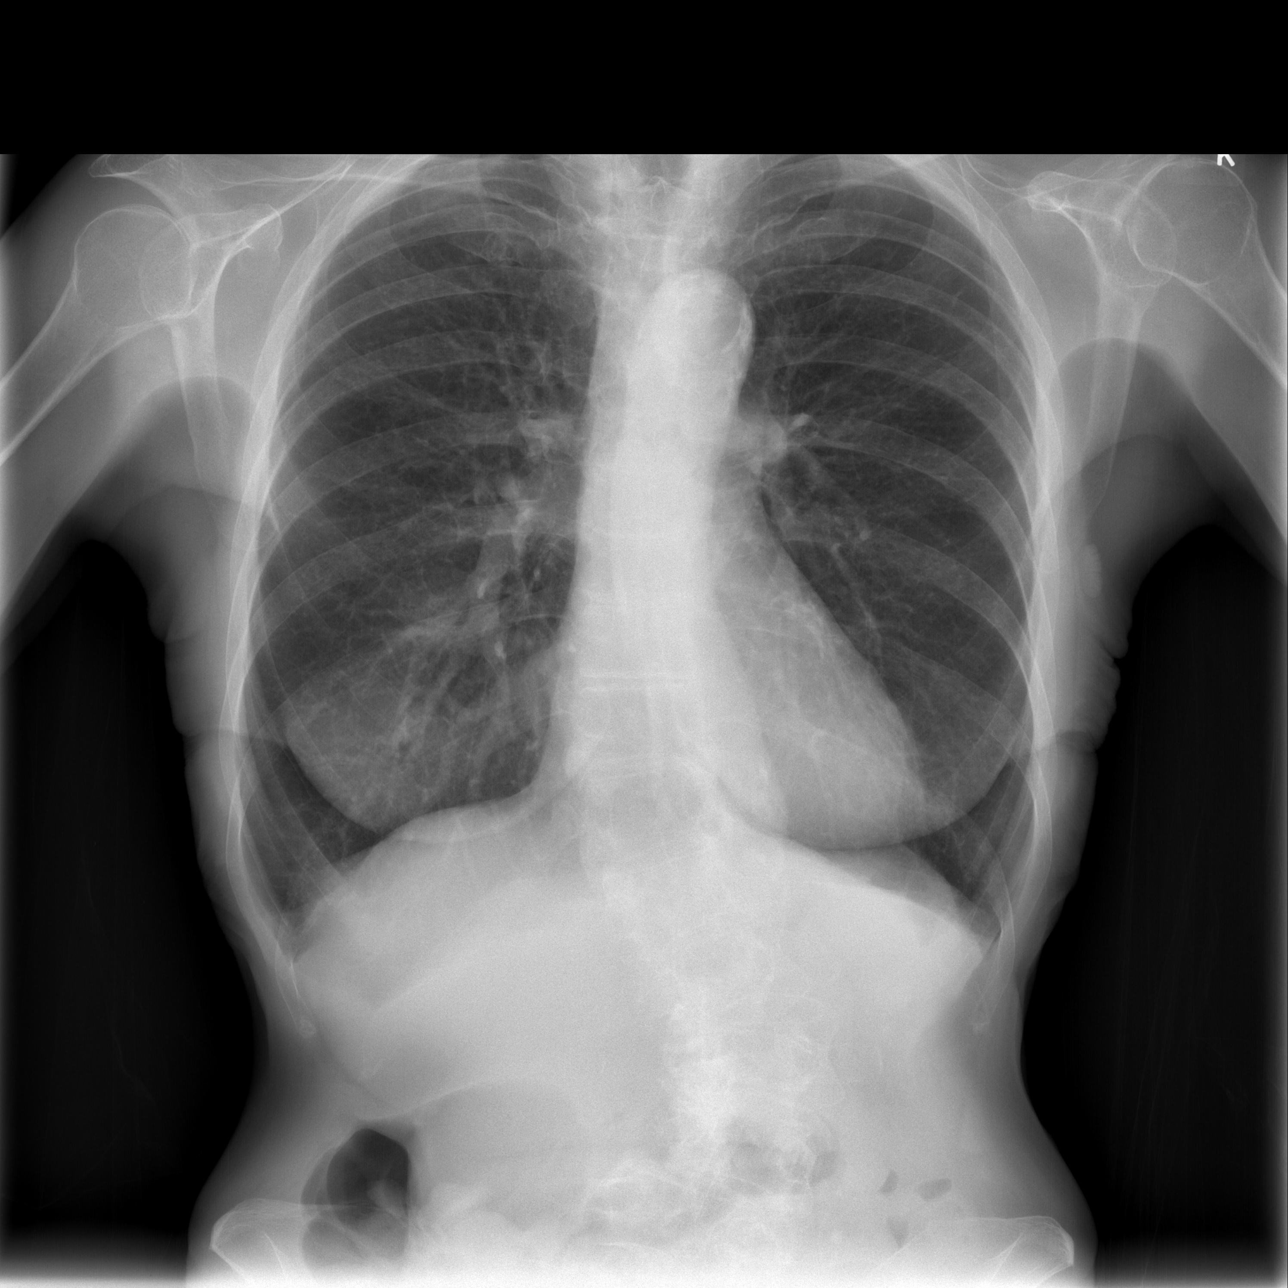

[w chest lat (1 of 2)]
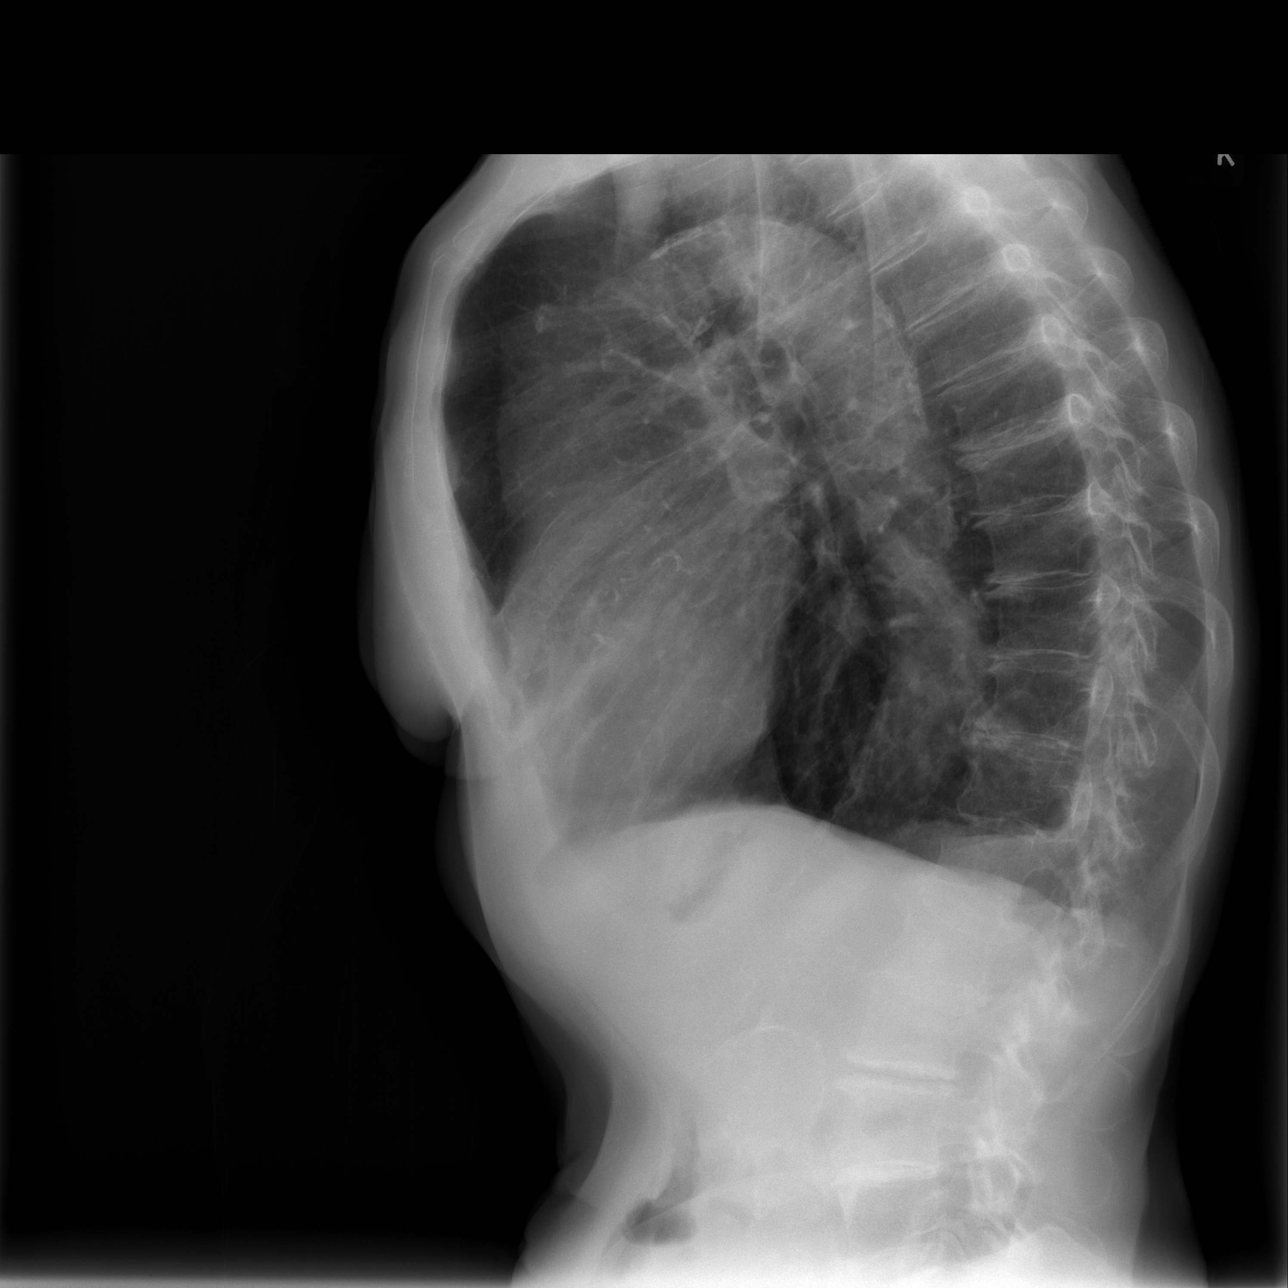

[w chest lat (2 of 2)]
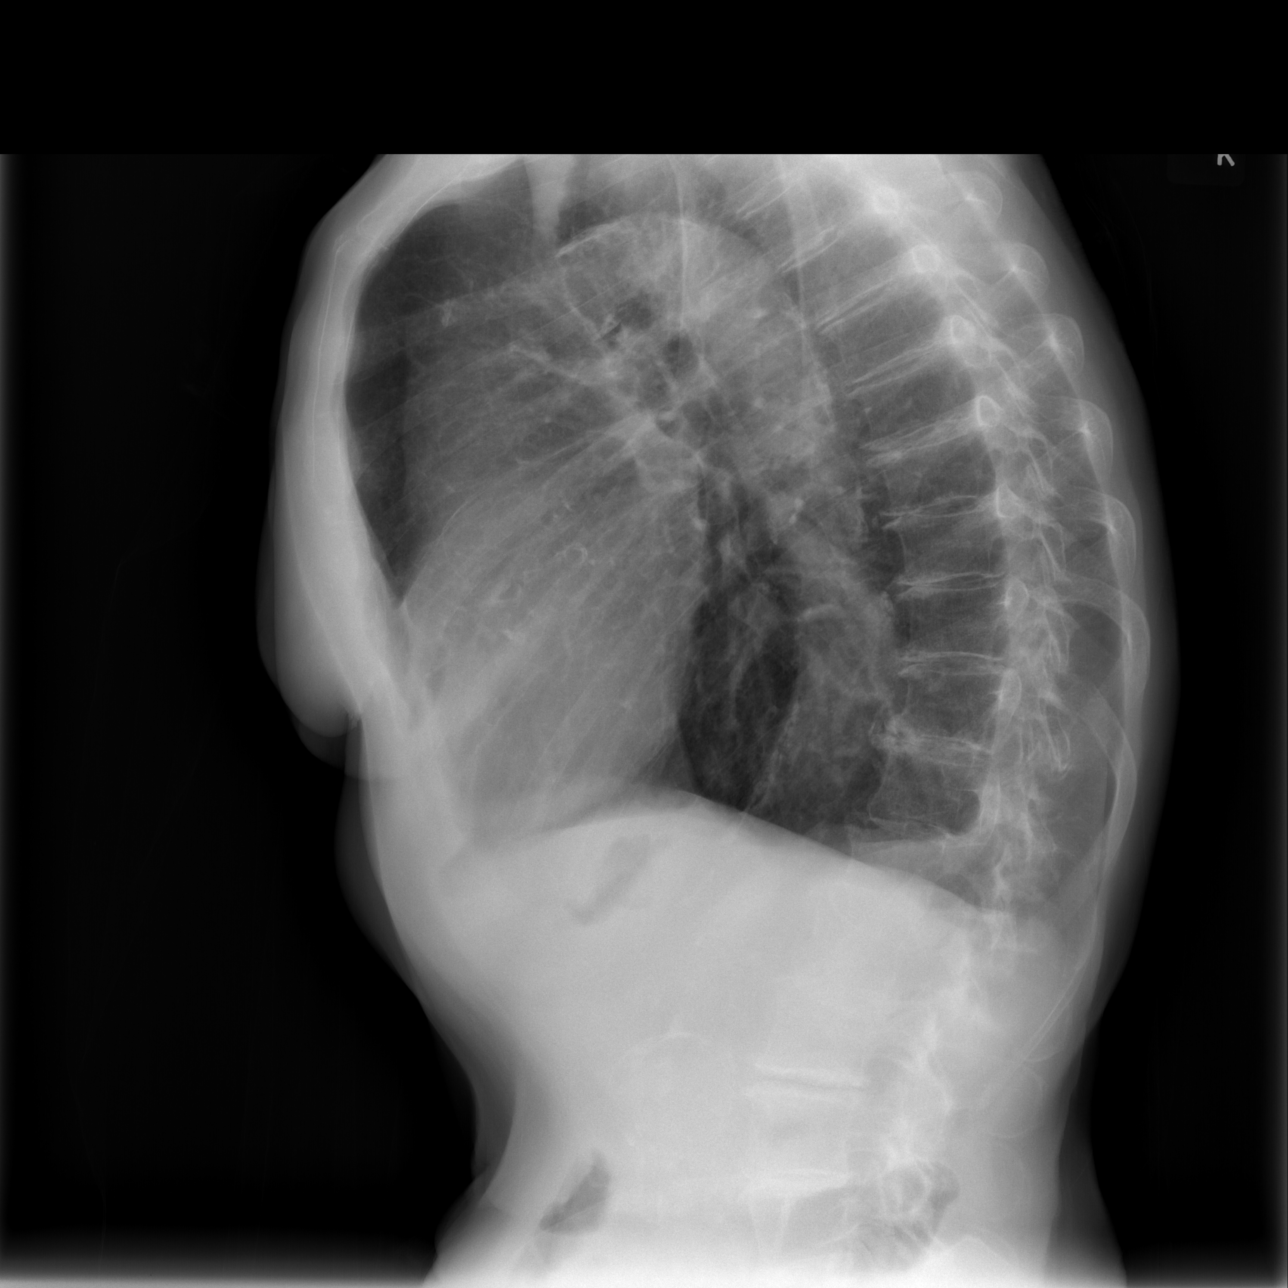

[3 of 3 positions shown; findings below may reference images not displayed]

FINDINGS: The chest is markedly hyperexpanded.  Lungs are clear.
No pneumothorax or effusion.  Severe convex left lumbar scoliosis
is noted.
IMPRESSION: 1.  Marked pulmonary hyperexpansion consistent with emphysema.  No
acute finding.
2.  Severe convex left lumbar scoliosis.

## 2011-05-11 NOTE — Pre-Procedure Instructions (Signed)
[phone]- paged Dr Gerrit Friends- MD ON CALL re critical lab result- potassium- notified by phone at 1720 per L Rusell Meneely RN

## 2011-05-11 NOTE — Patient Instructions (Addendum)
20 Jennifer [name]  05/11/2011   Your procedure is scheduled on:  Thursday 05/18/2011 at 1030 am  Report to [facility] at 0800 AM.  Call this number if you have problems the morning of surgery: [phone]   Remember:FOLLOW RECTAL PREP ORDERS FROM DR. TOTH -CLEAR LIQUID DIET AFTER 500PM 05/17/2011                                STOP ANY ASPIRIN, MOTRIN, ADVIL, IBUPROFEN OR ANY OTHER MEDICATIONS THAT DR. Billey Chang OFFICE INSTRUCTED YOU TO STOP 5 DAYS BEFORE SURGERY!   Do not eat food:After Midnight.  May have clear liquids:until Midnight .  Clear liquids include soda, tea, black coffee, apple or grape juice, broth.  Take these medicines the morning of surgery with A SIP OF WATER: NONE   Do not wear jewelry, make-up or nail polish.  Do not wear lotions, powders, or perfumes.   Do not shave 48 hours prior to surgery.(WOMEN ONLY-SHAVING LEGS)  Do not bring valuables to the hospital.  Contacts, dentures or bridgework may not be worn into surgery.  Leave suitcase in the car. After surgery it may be brought to your room.  For patients admitted to the hospital, checkout time is 11:00 AM the day of discharge.   Patients discharged the day of surgery will not be allowed to drive home.  Name and phone number of your driver: Norval Morton, [phone]  Special Instructions: CHG Shower Use Special Wash: 1/2 bottle night before surgery and 1/2 bottle morning of surgery.   Please read over the following fact sheets that you were given: MRSA Information

## 2011-05-12 NOTE — Pre-Procedure Instructions (Signed)
05-11-11- Dr. Gerrit Friends called  to say review of Potassium 2.5, he is aware per Lennie Odor.-noted by W. Gerard Bonus,RN 05-12-11

## 2011-05-15 ENCOUNTER — Telehealth: Payer: Self-pay

## 2011-05-15 MED ORDER — FLEET ENEMA 7-19 GM/118ML RE ENEM
1.0000 | ENEMA | Freq: Once | RECTAL | Status: DC
  Filled 2011-05-15: qty 1

## 2011-05-15 NOTE — Telephone Encounter (Signed)
Pt called requesting a call back from MD to discuss upcoming surgery.

## 2011-05-17 ENCOUNTER — Encounter: Payer: Self-pay | Admitting: Internal Medicine

## 2011-05-17 ENCOUNTER — Other Ambulatory Visit: Payer: Self-pay | Admitting: Internal Medicine

## 2011-05-17 DIAGNOSIS — M810 Age-related osteoporosis without current pathological fracture: Secondary | ICD-10-CM | POA: Insufficient documentation

## 2011-05-17 MED ORDER — CALCIUM CARBONATE-VITAMIN D 500-200 MG-UNIT PO TABS: 2.0000 | ORAL_TABLET | Freq: Two times a day (BID) | ORAL | Status: DC

## 2011-05-17 MED ORDER — ALENDRONATE SODIUM 70 MG PO TABS: 70.0000 mg | ORAL_TABLET | ORAL | Status: DC

## 2011-05-17 NOTE — Telephone Encounter (Signed)
Offer OV please so i may spend adequate time with her on any concerns, may also need preop clearance depending on type of surgery? - thanks

## 2011-05-17 NOTE — Telephone Encounter (Signed)
Pt is concerned about her potassium levels, which were "extremely low" per Dr Larae Grooms. Pt wanted to ask VAL is she thought they may cancel her surgery because of this. Pt advised to discuss impact of labs with surgeon. Pt agreed.

## 2011-05-18 ENCOUNTER — Encounter (HOSPITAL_COMMUNITY)
Admission: RE | Disposition: A | Discharge status: Home / Self Care | Payer: Self-pay | Source: Ambulatory Visit | Attending: General Surgery

## 2011-05-18 ENCOUNTER — Encounter (HOSPITAL_COMMUNITY): Payer: Self-pay | Admitting: Anesthesiology

## 2011-05-18 ENCOUNTER — Ambulatory Visit (HOSPITAL_COMMUNITY): Payer: Medicare Other | Admitting: Anesthesiology

## 2011-05-18 ENCOUNTER — Encounter (HOSPITAL_COMMUNITY): Payer: Self-pay | Admitting: *Deleted

## 2011-05-18 ENCOUNTER — Ambulatory Visit (HOSPITAL_COMMUNITY)
Admission: RE | Admit: 2011-05-18 | Discharge: 2011-05-18 | Disposition: A | Discharge status: Home / Self Care | Payer: Medicare Other | Source: Ambulatory Visit | Attending: General Surgery | Admitting: General Surgery

## 2011-05-18 ENCOUNTER — Other Ambulatory Visit (INDEPENDENT_AMBULATORY_CARE_PROVIDER_SITE_OTHER): Payer: Self-pay | Admitting: General Surgery

## 2011-05-18 DIAGNOSIS — R9431 Abnormal electrocardiogram [ECG] [EKG]: Secondary | ICD-10-CM | POA: Insufficient documentation

## 2011-05-18 DIAGNOSIS — K644 Residual hemorrhoidal skin tags: Secondary | ICD-10-CM | POA: Insufficient documentation

## 2011-05-18 DIAGNOSIS — K645 Perianal venous thrombosis: Secondary | ICD-10-CM

## 2011-05-18 DIAGNOSIS — K648 Other hemorrhoids: Secondary | ICD-10-CM | POA: Insufficient documentation

## 2011-05-18 HISTORY — PX: HEMORRHOID SURGERY: SHX153

## 2011-05-18 LAB — POTASSIUM: Potassium: 3.9 mEq/L (ref 3.5–5.1)

## 2011-05-18 SURGERY — HEMORRHOIDECTOMY
Anesthesia: General | Wound class: Contaminated

## 2011-05-18 MED ORDER — ONDANSETRON HCL 4 MG/2ML IJ SOLN
INTRAMUSCULAR | Status: DC | PRN
  Administered 2011-05-18: 4 mg via INTRAVENOUS

## 2011-05-18 MED ORDER — DIBUCAINE 1 % EX OINT: TOPICAL_OINTMENT | Freq: Three times a day (TID) | CUTANEOUS | Status: AC | PRN

## 2011-05-18 MED ORDER — DIBUCAINE 1 % RE OINT
TOPICAL_OINTMENT | RECTAL | Status: AC
  Filled 2011-05-18: qty 28

## 2011-05-18 MED ORDER — DEXTROSE 5 % IV SOLN
1.0000 g | INTRAVENOUS | Status: AC
  Administered 2011-05-18: 1 g via INTRAVENOUS

## 2011-05-18 MED ORDER — PROPOFOL 10 MG/ML IV BOLUS
INTRAVENOUS | Status: DC | PRN
  Administered 2011-05-18: 160 mg via INTRAVENOUS

## 2011-05-18 MED ORDER — BUPIVACAINE-EPINEPHRINE 0.25% -1:200000 IJ SOLN
INTRAMUSCULAR | Status: AC
  Filled 2011-05-18: qty 1

## 2011-05-18 MED ORDER — GLYCOPYRROLATE 0.2 MG/ML IJ SOLN
INTRAMUSCULAR | Status: DC | PRN
  Administered 2011-05-18: .3 mg via INTRAVENOUS

## 2011-05-18 MED ORDER — ACETAMINOPHEN 10 MG/ML IV SOLN
INTRAVENOUS | Status: AC
  Filled 2011-05-18: qty 100

## 2011-05-18 MED ORDER — BUPIVACAINE-EPINEPHRINE PF 0.25-1:200000 % IJ SOLN
INTRAMUSCULAR | Status: AC
  Filled 2011-05-18: qty 30

## 2011-05-18 MED ORDER — BUPIVACAINE-EPINEPHRINE 0.25% -1:200000 IJ SOLN
INTRAMUSCULAR | Status: DC | PRN
  Administered 2011-05-18: 9 mL

## 2011-05-18 MED ORDER — LACTATED RINGERS IV SOLN
INTRAVENOUS | Status: DC
  Administered 2011-05-18: 1000 mL via INTRAVENOUS

## 2011-05-18 MED ORDER — HYALURONIDASE OVINE 200 UNIT/ML IJ SOLN
INTRAMUSCULAR | Status: DC | PRN
  Administered 2011-05-18: 200 [IU] via SUBCUTANEOUS

## 2011-05-18 MED ORDER — BUPIVACAINE-EPINEPHRINE 0.5% -1:200000 IJ SOLN
INTRAMUSCULAR | Status: AC
  Filled 2011-05-18: qty 1

## 2011-05-18 MED ORDER — NEOSTIGMINE METHYLSULFATE 1 MG/ML IJ SOLN
INTRAMUSCULAR | Status: DC | PRN
  Administered 2011-05-18: 2.5 mg via INTRAVENOUS

## 2011-05-18 MED ORDER — ACETAMINOPHEN 10 MG/ML IV SOLN
INTRAVENOUS | Status: DC | PRN
  Administered 2011-05-18: 1000 mg via INTRAVENOUS

## 2011-05-18 MED ORDER — CEFOXITIN SODIUM-DEXTROSE 1-4 GM-% IV SOLR (PREMIX)
INTRAVENOUS | Status: AC
  Filled 2011-05-18: qty 50

## 2011-05-18 MED ORDER — SUCCINYLCHOLINE CHLORIDE 20 MG/ML IJ SOLN
INTRAMUSCULAR | Status: DC | PRN
  Administered 2011-05-18: 100 mg via INTRAVENOUS

## 2011-05-18 MED ORDER — DIBUCAINE 1 % RE OINT
TOPICAL_OINTMENT | RECTAL | Status: DC | PRN
  Administered 2011-05-18: 1 via RECTAL

## 2011-05-18 MED ORDER — ROCURONIUM BROMIDE 100 MG/10ML IV SOLN
INTRAVENOUS | Status: DC | PRN
  Administered 2011-05-18: 20 mg via INTRAVENOUS

## 2011-05-18 MED ORDER — MIDAZOLAM HCL 2 MG/2ML IJ SOLN
INTRAMUSCULAR | Status: AC
  Filled 2011-05-18: qty 2

## 2011-05-18 MED ORDER — HYDROCODONE-ACETAMINOPHEN 10-325 MG PO TABS: 1.0000 | ORAL_TABLET | Freq: Four times a day (QID) | ORAL | Status: DC | PRN

## 2011-05-18 MED ORDER — DOCUSATE SODIUM 100 MG PO CAPS: 100.0000 mg | ORAL_CAPSULE | Freq: Two times a day (BID) | ORAL | Status: AC

## 2011-05-18 MED ORDER — HYALURONIDASE OVINE 200 UNIT/ML IJ SOLN
INTRAMUSCULAR | Status: AC
  Filled 2011-05-18: qty 1.2

## 2011-05-18 MED ORDER — FENTANYL CITRATE 0.05 MG/ML IJ SOLN
INTRAMUSCULAR | Status: AC
  Filled 2011-05-18: qty 2

## 2011-05-18 MED ORDER — 0.9 % SODIUM CHLORIDE (POUR BTL) OPTIME
TOPICAL | Status: DC | PRN
  Administered 2011-05-18: 1000 mL

## 2011-05-18 MED ORDER — BUPIVACAINE LIPOSOME 1.3 % IJ SUSP
20.0000 mL | Freq: Once | INTRAMUSCULAR | Status: AC
  Administered 2011-05-18: 10 mL
  Filled 2011-05-18: qty 20

## 2011-05-18 MED ORDER — LIDOCAINE HCL (CARDIAC) 20 MG/ML IV SOLN
INTRAVENOUS | Status: DC | PRN
  Administered 2011-05-18: 50 mg via INTRAVENOUS

## 2011-05-18 MED ORDER — FENTANYL CITRATE 0.05 MG/ML IJ SOLN
INTRAMUSCULAR | Status: DC | PRN
  Administered 2011-05-18 (×2): 50 ug via INTRAVENOUS

## 2011-05-18 SURGICAL SUPPLY — 35 items
BLADE HEX COATED 2.75 (ELECTRODE) ×2 IMPLANT
BLADE SURG 15 STRL LF DISP TIS (BLADE) ×1 IMPLANT
BLADE SURG 15 STRL SS (BLADE) ×1
CANISTER SUCTION 2500CC (MISCELLANEOUS) ×2 IMPLANT
CLOTH BEACON ORANGE TIMEOUT ST (SAFETY) ×2 IMPLANT
COVER SURGICAL LIGHT HANDLE (MISCELLANEOUS) ×2 IMPLANT
DECANTER SPIKE VIAL GLASS SM (MISCELLANEOUS) ×2 IMPLANT
DRAPE LAPAROTOMY T 102X78X121 (DRAPES) ×2 IMPLANT
DRAPE LG THREE QUARTER DISP (DRAPES) IMPLANT
DRSG PAD ABDOMINAL 8X10 ST (GAUZE/BANDAGES/DRESSINGS) IMPLANT
ELECT REM PT RETURN 9FT ADLT (ELECTROSURGICAL) ×2
ELECTRODE REM PT RTRN 9FT ADLT (ELECTROSURGICAL) ×1 IMPLANT
GAUZE SPONGE 4X4 16PLY XRAY LF (GAUZE/BANDAGES/DRESSINGS) ×2 IMPLANT
GAUZE VASELINE 3X9 (GAUZE/BANDAGES/DRESSINGS) IMPLANT
GLOVE BIO SURGEON STRL SZ7.5 (GLOVE) ×2 IMPLANT
GLOVE BIOGEL PI IND STRL 7.0 (GLOVE) IMPLANT
GLOVE BIOGEL PI INDICATOR 7.0 (GLOVE)
GOWN PREVENTION PLUS XLARGE (GOWN DISPOSABLE) ×2 IMPLANT
GOWN STRL NON-REIN LRG LVL3 (GOWN DISPOSABLE) ×2 IMPLANT
GOWN STRL REIN XL XLG (GOWN DISPOSABLE) ×2 IMPLANT
KIT BASIN OR (CUSTOM PROCEDURE TRAY) ×2 IMPLANT
LUBRICANT JELLY K Y 4OZ (MISCELLANEOUS) ×2 IMPLANT
NDL SAFETY ECLIPSE 18X1.5 (NEEDLE) IMPLANT
NEEDLE HYPO 18GX1.5 SHARP (NEEDLE)
NEEDLE HYPO 25X1 1.5 SAFETY (NEEDLE) ×2 IMPLANT
NS IRRIG 1000ML POUR BTL (IV SOLUTION) ×2 IMPLANT
PACK LITHOTOMY IV (CUSTOM PROCEDURE TRAY) IMPLANT
PENCIL BUTTON HOLSTER BLD 10FT (ELECTRODE) ×2 IMPLANT
SPONGE GAUZE 4X4 12PLY (GAUZE/BANDAGES/DRESSINGS) IMPLANT
SPONGE SURGIFOAM ABS GEL 12-7 (HEMOSTASIS) ×2 IMPLANT
SUT CHROMIC 2 0 SH (SUTURE) ×4 IMPLANT
SUT CHROMIC 3 0 SH 27 (SUTURE) IMPLANT
SYR CONTROL 10ML LL (SYRINGE) ×2 IMPLANT
TOWEL OR 17X26 10 PK STRL BLUE (TOWEL DISPOSABLE) ×2 IMPLANT
YANKAUER SUCT BULB TIP 10FT TU (MISCELLANEOUS) ×2 IMPLANT

## 2011-05-18 NOTE — Op Note (Signed)
05/18/2011  1:10 PM  PATIENT:  Jennifer [name]  71 y.o. female  PRE-OPERATIVE DIAGNOSIS:  hemorrhoids  POST-OPERATIVE DIAGNOSIS:  hemorrhoids  PROCEDURE:  Procedure(s): HEMORRHOIDECTOMY  SURGEON:  Surgeon(s): Caleen Essex III, MD  PHYSICIAN ASSISTANT:   ASSISTANTS: none   ANESTHESIA:   general  EBL:  Total I/O In: 1000 [I.V.:1000] Out: -   BLOOD ADMINISTERED:none  DRAINS: none   LOCAL MEDICATIONS USED:  MARCAINE 10CC  SPECIMEN:  Excision  DISPOSITION OF SPECIMEN:  PATHOLOGY  COUNTS:  YES  TOURNIQUET:  * No tourniquets in log *  DICTATION: .Dragon Dictation After informed consent was obtained the patient was brought to the operating room and left in the supine position on the stretcher. After adequate induction of general anesthesia the patient was then moved into a prone position on the operating room table mull pressure points were padded. She was placed in a jackknife type position. Her buttocks were retracted laterally with tape. Her perirectal area was then prepped with Betadine and draped in usual sterile manner. The patient had very large circumferential internal and external hemorrhoids. The hemorrhoids were injected with 1 cc of Wydase and 9 cc of Marcaine with epinephrine. The tissue was massaged gently for several minutes. Next a bullet-type retractor was placed in the rectum the rectum was examined circumferentially. We chose the 3 largest columns to remove. Each of these hemorrhoid columns were elevated and scored near their base with a 15 blade knife. A hemostat was placed across the base of the hemorrhoid. The hemorrhoid was then excised sharply with Metzenbaum scissors on top of the hemostat. Care was taken to stay away from the sphincter muscles. The hemorrhoids that were removed were labeled as right posterior, right anterior, and left lateral. Each incision was then closed with a running 2-0 chromic stitch. The last few millimeters of each incision were left  open for drainage purposes. Hemostasis was achieved using the Bovie electrocautery at the open areas. Each incision was reinforced with one or 2 interrupted 2-0 chromic stitches. Once this was accomplished the rectum appeared much improved. The patient still had some hemorrhoid tissue left behind but she understood that this would be the case. At this point, 2 small pieces of Gelfoam coated with dibucaine ointment and placed in the rectum. The outside of the rectum was also coated with dibucaine ointmentand sterile dressings were applied. The patient tolerated the procedure well. At the end of the case on needle sponge and instrument counts are correct. The patient was then awakened and taken to recovery in stable condition.   PLAN OF CARE: Discharge to home after PACU  PATIENT DISPOSITION:  PACU - hemodynamically stable.   Delay start of Pharmacological VTE agent (>24hrs) due to surgical blood loss or risk of bleeding:  {YES/NO/NOT APPLICABLE:20182

## 2011-05-18 NOTE — Transfer of Care (Signed)
Immediate Anesthesia Transfer of Care Note  Patient: Jennifer [name]  Procedure(s) Performed:  HEMORRHOIDECTOMY - hemorrhoidectomy, three columns, internal and external  Patient Location: PACU  Anesthesia Type: General  Level of Consciousness: awake, alert , oriented and patient cooperative  Airway & Oxygen Therapy: Patient Spontanous Breathing and Patient connected to face mask oxygen  Post-op Assessment: Report given to PACU RN, Post -op Vital signs reviewed and stable and Patient moving all extremities X 4  Post vital signs: Reviewed and stable  Complications: No apparent anesthesia complications

## 2011-05-18 NOTE — Preoperative (Signed)
Beta Blockers   Reason not to administer Beta Blockers:Not Applicable 

## 2011-05-18 NOTE — H&P (View-Only) (Signed)
Subjective:     Patient ID: Jennifer [name], female   DOB: 04/01/1941, 71 y.o.   MRN: [id number]  HPI The patient is a 71-year-old white female who has had large internal and external hemorrhoids. She has been managing this conservatively at home and has been improving. She is having significantly less discomfort in her rectum. She is now having a bleeding anymore.  Review of Systems  Constitutional: Positive for unexpected weight change.  HENT: Negative.   Eyes: Negative.   Respiratory: Negative.   Cardiovascular: Negative.   Gastrointestinal: Negative.   Genitourinary: Negative.   Musculoskeletal: Negative.   Skin: Negative.   Neurological: Negative.   Hematological: Negative.   Psychiatric/Behavioral: Negative.        Objective:   Physical Exam  Constitutional: She is oriented to person, place, and time. She appears well-developed and well-nourished.  HENT:  Head: Normocephalic and atraumatic.  Eyes: Conjunctivae and EOM are normal. Pupils are equal, round, and reactive to light.  Neck: Normal range of motion. Neck supple.  Cardiovascular: Normal rate, regular rhythm and normal heart sounds.   Pulmonary/Chest: Effort normal and breath sounds normal.  Abdominal: Soft. Bowel sounds are normal.  Genitourinary:       Large circumferential internal and external hemorrhoids that are present but significantly smaller than they were just a couple weeks ago  Musculoskeletal: Normal range of motion.  Neurological: She is alert and oriented to person, place, and time.  Skin: Skin is warm and dry.  Psychiatric: She has a normal mood and affect. Her behavior is normal.       Assessment:     Large internal and external hemorrhoids    Plan:     At this point she has had some shrinkage of the tissue and the tissue looks a lot healthier. I think she would be a good candidate at this point for an old-fashioned hemorrhoidectomy. I have discussed with her in detail the risks and  benefits the operation the digits as well as some of the technical aspects and she understands and wishes to proceed.      

## 2011-05-18 NOTE — Anesthesia Postprocedure Evaluation (Signed)
Anesthesia Post Note  Patient: Jennifer [name]  Procedure(s) Performed:  HEMORRHOIDECTOMY - hemorrhoidectomy, three columns, internal and external  Anesthesia type: General  Patient location: PACU  Post pain: Pain level controlled  Post assessment: Post-op Vital signs reviewed  Last Vitals:  Filed Vitals:   05/18/11 1315  BP: 159/88  Pulse: 78  Temp:   Resp: 18    Post vital signs: Reviewed  Level of consciousness: sedated  Complications: No apparent anesthesia complications

## 2011-05-18 NOTE — Interval H&P Note (Signed)
History and Physical Interval Note:  05/18/2011 10:22 AM  Jennifer [name]  has presented today for surgery, with the diagnosis of hemorrhoids  The various methods of treatment have been discussed with the patient and family. After consideration of risks, benefits and other options for treatment, the patient has consented to  Procedure(s): HEMORRHOIDECTOMY as a surgical intervention .  The patients' history has been reviewed, patient examined, no change in status, stable for surgery.  I have reviewed the patients' chart and labs.  Questions were answered to the patient's satisfaction.     TOTH III,Jeffie Widdowson S

## 2011-05-18 NOTE — Anesthesia Preprocedure Evaluation (Signed)
Anesthesia Evaluation  Patient identified by MRN, date of birth, ID band Patient awake    Reviewed: Allergy & Precautions, H&P , NPO status , Patient's Chart, lab work & pertinent test results  History of Anesthesia Complications Negative for: history of anesthetic complications  Airway Mallampati: I  Neck ROM: Full    Dental  (+) Edentulous Upper, Edentulous Lower and Dental Advisory Given   Pulmonary neg pulmonary ROS, COPDCurrent Smoker (50plus pak years),  clear to auscultation  Pulmonary exam normal       Cardiovascular hypertension, neg cardio ROS Regular Normal Peripheral vascular disease with poor circulation per patient   Neuro/Psych Negative Neurological ROS  Negative Psych ROS   GI/Hepatic negative GI ROS, Neg liver ROS,   Endo/Other  Negative Endocrine ROS  Renal/GU negative Renal ROS  Genitourinary negative   Musculoskeletal negative musculoskeletal ROS (+)   Abdominal   Peds negative pediatric ROS (+)  Hematology negative hematology ROS (+)   Anesthesia Other Findings   Reproductive/Obstetrics negative OB ROS                           Anesthesia Physical Anesthesia Plan  ASA: III  Anesthesia Plan: General   Post-op Pain Management:    Induction: Intravenous  Airway Management Planned: Oral ETT  Additional Equipment:   Intra-op Plan:   Post-operative Plan: Extubation in OR  Informed Consent: I have reviewed the patients History and Physical, chart, labs and discussed the procedure including the risks, benefits and alternatives for the proposed anesthesia with the patient or authorized representative who has indicated his/her understanding and acceptance.   Dental advisory given  Plan Discussed with: CRNA  Anesthesia Plan Comments:         Anesthesia Quick Evaluation

## 2011-05-19 ENCOUNTER — Telehealth (INDEPENDENT_AMBULATORY_CARE_PROVIDER_SITE_OTHER): Payer: Self-pay

## 2011-05-19 NOTE — Telephone Encounter (Signed)
Pt called wanting to know when she should start her pletal and aspirin back. Pt states she has very little bleeding and feels well. I reviewed this with Dr Carolynne Edouard via phone. Per his order pt advised she can start her meds again today. Pt to call if she has any concerns.

## 2011-05-22 ENCOUNTER — Telehealth (INDEPENDENT_AMBULATORY_CARE_PROVIDER_SITE_OTHER): Payer: Self-pay | Admitting: General Surgery

## 2011-05-22 ENCOUNTER — Encounter (HOSPITAL_COMMUNITY): Payer: Self-pay | Admitting: General Surgery

## 2011-05-22 NOTE — Telephone Encounter (Signed)
APPOINTMENT MADE WITH DR. TOTH ON 06-12-11/GY

## 2011-05-25 ENCOUNTER — Telehealth (INDEPENDENT_AMBULATORY_CARE_PROVIDER_SITE_OTHER): Payer: Self-pay

## 2011-05-25 NOTE — Telephone Encounter (Signed)
Pt one week po hemorrhoidectomy.  She is passing gas, but has not had a bowel movement.  I recommended she try Miralax bid with stool softeners, drink plenty of fluids, but not to push to try and have a BM.  She understood and will comply.

## 2011-06-08 ENCOUNTER — Encounter (INDEPENDENT_AMBULATORY_CARE_PROVIDER_SITE_OTHER): Payer: Medicare Other | Admitting: General Surgery

## 2011-06-12 ENCOUNTER — Ambulatory Visit (INDEPENDENT_AMBULATORY_CARE_PROVIDER_SITE_OTHER): Payer: Medicare Other | Admitting: General Surgery

## 2011-06-12 VITALS — BP 150/90 | HR 80 | Temp 98.4°F | Resp 14 | Ht 64.0 in | Wt 94.0 lb

## 2011-06-12 DIAGNOSIS — K645 Perianal venous thrombosis: Secondary | ICD-10-CM

## 2011-06-13 ENCOUNTER — Encounter (INDEPENDENT_AMBULATORY_CARE_PROVIDER_SITE_OTHER): Payer: Self-pay | Admitting: General Surgery

## 2011-06-13 NOTE — Progress Notes (Signed)
Subjective:     Patient ID: VANIYAH LANSKY, female   DOB: [birth year], 71 y.o.   MRN: [id number]  HPI The patient is a 71 year old white female who is almost a month out from a 3 column hemorrhoidectomy. She is going to the point now where she is able to have bowel movements without much discomfort. She has not noticed any blood in her stool. Her bowels are soft and moving regularly. She is feeling much better now.  Review of Systems     Objective:   Physical Exam On exam her perirectal skin is healthy. She still has moderate to large size circumferential hemorrhoid tissue but it is much smaller than it was originally. Her wounds appear to heal nicely. She has good rectal tone.    Assessment:     One month status post hemorrhoidectomy.    Plan:     She will continue to keep the area clean and dry. She will try to avoid periods of constipation. We will plan to see her back in another month or 2 to check her progress

## 2011-07-04 ENCOUNTER — Other Ambulatory Visit: Payer: Self-pay | Admitting: Internal Medicine

## 2011-07-10 ENCOUNTER — Ambulatory Visit (INDEPENDENT_AMBULATORY_CARE_PROVIDER_SITE_OTHER): Payer: Medicare Other | Admitting: General Surgery

## 2011-07-10 ENCOUNTER — Encounter (INDEPENDENT_AMBULATORY_CARE_PROVIDER_SITE_OTHER): Payer: Self-pay | Admitting: General Surgery

## 2011-07-10 ENCOUNTER — Other Ambulatory Visit: Payer: Self-pay | Admitting: *Deleted

## 2011-07-10 ENCOUNTER — Encounter (INDEPENDENT_AMBULATORY_CARE_PROVIDER_SITE_OTHER): Payer: Medicare Other | Admitting: General Surgery

## 2011-07-10 VITALS — BP 152/96 | HR 70 | Temp 97.8°F | Resp 18 | Ht 64.0 in | Wt 93.4 lb

## 2011-07-10 DIAGNOSIS — K645 Perianal venous thrombosis: Secondary | ICD-10-CM

## 2011-07-10 MED ORDER — CILOSTAZOL 50 MG PO TABS: 50.0000 mg | ORAL_TABLET | Freq: Two times a day (BID) | ORAL | Status: DC

## 2011-07-10 NOTE — Progress Notes (Signed)
Subjective:     Patient ID: Jennifer [name], female   DOB: September 30, 1940, 71 y.o.   MRN: [id number]  HPI The patient is a 71 year old white female who is about 2 months out from a 3 column hemorrhoidectomy. Since her last visit she has had a few episodes of crampy lower abdominal pain. This has resolved after passing gas and having some loose stools. She thinks she may be taking too much MiraLax and stool softener. Otherwise though her rectum feels good and is making slow improvement. She denies any bleeding with her bowel movements.  Review of Systems     Objective:   Physical Exam On exam her perirectal skin looks good. She still has circumferential external and probably some internal hemorrhoidal disease but it is significantly smaller than it was originally. She has no rectal pain and good rectal tone.    Assessment:     2 months status post 3 column hemorrhoidectomy    Plan:     Overall she seems very pleased with the results of her surgery. She still has some residual hemorrhoidal disease but she is now pretty much asymptomatic. She will continue to titrate her MiraLax so that her stools are soft but not too loose. She will plan to call us and she has any further problems with her rectum

## 2011-07-10 NOTE — Patient Instructions (Signed)
Titrate miralax to desired effect

## 2011-08-02 ENCOUNTER — Telehealth: Payer: Self-pay | Admitting: Cardiovascular Disease

## 2011-08-02 DIAGNOSIS — I739 Peripheral vascular disease, unspecified: Secondary | ICD-10-CM

## 2011-08-02 NOTE — Telephone Encounter (Signed)
Spoke with pt. She has pain in right leg that started several days ago. Pain starts in lower back and goes down back of right leg. Pain gets worse as day goes on and is extremely bad at night and is keeping her from sleeping.  Pain does not worsen when walking and seems different from pain she was having when she saw Dr. Clifton James. Pletal has helped that pain.  She had last ABI's in May 2012 and is due for year follow up with Dr. Clifton James in June.  Had hemorrhoidectomy recently and had slow recovery but states she is doing better now. Recently started doing stretching exercises and she thinks pain started after that.  She has been taking Advil with little relief and is asking if she needs something stronger to help with leg pain.  Will review with Dr. Clifton James

## 2011-08-02 NOTE — Telephone Encounter (Signed)
Spoke with pt and gave her information from Dr. Clifton James. She will come in for Lower ext. Arterial dopplers on August 07, 2011 at 3:00

## 2011-08-02 NOTE — Telephone Encounter (Signed)
Please return call to patient at [phone]  Patient c/o of pain in right leg over the last several days, starts in the morning and gets better through the day.  Pleatal meds given by Dr. Clifton James seem to help this problem. Please return call to patient to discuss.

## 2011-08-02 NOTE — Telephone Encounter (Signed)
Can we arrange for her to have LE dopplers this week or next? Continue Tylenol for pain. THis may be referred neuro pain from back. I will review dopplers and call her. Thanks, cdm

## 2011-08-07 ENCOUNTER — Encounter (INDEPENDENT_AMBULATORY_CARE_PROVIDER_SITE_OTHER): Payer: Medicare Other

## 2011-08-07 DIAGNOSIS — I739 Peripheral vascular disease, unspecified: Secondary | ICD-10-CM

## 2011-09-11 ENCOUNTER — Ambulatory Visit (INDEPENDENT_AMBULATORY_CARE_PROVIDER_SITE_OTHER): Payer: Medicare Other | Admitting: Internal Medicine

## 2011-09-11 ENCOUNTER — Encounter: Payer: Self-pay | Admitting: Internal Medicine

## 2011-09-11 VITALS — BP 152/100 | HR 87 | Temp 98.0°F | Ht 64.0 in | Wt 95.0 lb

## 2011-09-11 DIAGNOSIS — M810 Age-related osteoporosis without current pathological fracture: Secondary | ICD-10-CM

## 2011-09-11 DIAGNOSIS — I10 Essential (primary) hypertension: Secondary | ICD-10-CM

## 2011-09-11 DIAGNOSIS — E785 Hyperlipidemia, unspecified: Secondary | ICD-10-CM

## 2011-09-11 MED ORDER — LOSARTAN POTASSIUM-HCTZ 50-12.5 MG PO TABS: 1.0000 | ORAL_TABLET | Freq: Two times a day (BID) | ORAL | Status: DC

## 2011-09-11 NOTE — Patient Instructions (Signed)
It was good to see you today. Medications reviewed, increase losartan-hctz to 1tab 2x/day - continue other medications as ongoing Ok to try fosamax again - if you are unable to tolerate this, we will consider alternate therapies such as Prolia Please schedule followup in 3 months for blood pressure and medication review, call sooner if problems. Continue to work on giving up those last cigarettes!!!

## 2011-09-11 NOTE — Assessment & Plan Note (Signed)
On statin since 07/2009 - The current medical regimen is effective;  continue present plan and medications.  

## 2011-09-11 NOTE — Progress Notes (Signed)
  Subjective:    Patient ID: Jennifer [name], female    DOB: 1941/01/11, 71 y.o.   MRN: [id number]  HPI  Here for follow up - reviewed chronic medical issues:  hypertension - intolerant of beta-blocker due to fatigue and ACEI due to cough; reports compliance with ongoing medical treatment and no changes in medication dose or frequency. denies adverse side effects related to current therapy.  no chest pain, no headache, no edema  dyslipidemia - statin began 07/2009 - reports compliance with ongoing medical treatment - no changes in medication dose or frequency. denies adverse side effects related to current therapy.   smoking- understands need to quit and is working on same. does not feel meds or self-help classes would help, attributes cost of smoking as motivating factor in desire to quit -  PAD/PVD - abn ABI 02/2010, improved on f/u ABI 08/2010 - follows with periph cards who initially recommended intervention but pt opted for medical management only - on pletal + ASA; symptoms much improved, less claudication in legs: "only hurts if i walk fast" -pain relieved with cessation of walking/rest  Past Medical History  Diagnosis Date  . SMOKER   . Peripheral vascular disease, unspecified dx 02/2010 ABI  . ANEMIA-NOS   . DYSLIPIDEMIA   . HYPERTENSION   . Hemorrhoids   . Osteoporosis, senile    Review of Systems  Constitutional: Negative for fatigue and unexpected weight change.  Respiratory: Negative for cough and shortness of breath.   Cardiovascular: Negative for chest pain and leg swelling.  Neurological: Negative for headaches.       Objective:   Physical Exam BP 152/100  Pulse 87  Temp(Src) 98 F (36.7 C) (Oral)  Ht 5\' 4"  (1.626 m)  Wt 95 lb (43.092 kg)  BMI 16.31 kg/m2  SpO2 95% Wt Readings from Last 3 Encounters:  09/11/11 95 lb (43.092 kg)  07/10/11 93 lb 6.4 oz (42.366 kg)  06/12/11 94 lb (42.638 kg)   Constitutional: She is thin, smells heavily of tobacco - but  appears well-developed and well-nourished. No distress.  Neck: Normal range of motion. Neck supple. No JVD present. No thyromegaly present.  Cardiovascular: Normal rate, regular rhythm and normal heart sounds.  No murmur heard. No BLE edema. Pulmonary/Chest: Effort normal and breath sounds normal. No respiratory distress. She has no wheezes. Psychiatric: She has a normal mood and affect. Her behavior is normal. Judgment and thought content normal.   Lab Results  Component Value Date   WBC 8.9 05/11/2011   HGB 12.1 05/11/2011   HCT 35.8* 05/11/2011   PLT 357 05/11/2011   GLUCOSE 86 05/11/2011   CHOL 188 03/08/2011   TRIG 68.0 03/08/2011   HDL 75.80 03/08/2011   LDLDIRECT 232.1 08/09/2009   LDLCALC 99 03/08/2011   ALT 13 11/08/2009   AST 22 11/08/2009   NA 136 05/11/2011   K 3.9 05/18/2011   CL 97 05/11/2011   CREATININE 0.67 05/11/2011   BUN 11 05/11/2011   CO2 28 05/11/2011   TSH 1.84 08/15/2010        Assessment & Plan:  See problem list. Medications and labs reviewed today.

## 2011-09-11 NOTE — Assessment & Plan Note (Signed)
Dx 04/2011 - trial fosamax caused nausea so pt self d/c'd same Will consider alternative therapy such as Prolia - education on same provided today Again encouraged tobacco cessation and WB execises

## 2011-09-11 NOTE — Assessment & Plan Note (Signed)
Home log reviewed - improved - Intolerant of ACEI due to cough, intolerant of lopressor due to fatigue Added HCTZ 07/2010 to other medications - continue titratation as needed - also changed to ARB-diuretic combo 02/2011 Titrate to max dose now  BP Readings from Last 3 Encounters:  09/11/11 152/100  07/10/11 152/96  06/12/11 150/90

## 2011-09-21 ENCOUNTER — Telehealth: Payer: Self-pay | Admitting: Internal Medicine

## 2011-09-21 DIAGNOSIS — I10 Essential (primary) hypertension: Secondary | ICD-10-CM

## 2011-09-21 NOTE — Telephone Encounter (Signed)
Pt confirmed Losartan 50-12.5 1 PO BID. Pt advised to decrease to 1 PO QD, continue monitoring BP and call back PRN.

## 2011-09-21 NOTE — Telephone Encounter (Signed)
Please clarify with pt: she is rx'd losartan hct (50/12.5) 1 tab BID -- not 2 tab BID!  If taking only 1 tab BID as rx'd AND having low BP as per CAN report - pt should resume 1 tab daily as before -   thanks

## 2011-09-21 NOTE — Telephone Encounter (Signed)
Caller: Jennifer [name]/Patient; PCP: Rene Paci; Call regarding Dose of Losartin HCTZ 50mg /12mg  Increased From 1. Tab PO QD To 2 Tabs PO BID. on 09/12/11 and she has been feeling some weakness since 09/15/11. Today BP 97/71 this morning and 106/79 @ 1300-09/21/11. SHE NORMALLY RUNS 120/80. NO OTHER CHANGES IN MEDS.  No other sx. Triage and Care advice per Weakness Protocool and advised to see Provider within 24 hours for Sx beginning after starting or changing dose of RX drug. SHE IS REQUESTING CALL BACK AFTER CHECKING WITH DR. Felicity Coyer TO SEE IF MORE TIME NEEDED TO ADJUST TO MED CHANGE OR IF DOSE NEEDS TO DECREASED.  CB#: [phone]

## 2011-09-21 NOTE — Telephone Encounter (Signed)
Addended by: Anselm Jungling on: 09/21/2011 05:13 PM   Modules accepted: Orders

## 2011-10-10 ENCOUNTER — Ambulatory Visit (INDEPENDENT_AMBULATORY_CARE_PROVIDER_SITE_OTHER): Payer: Medicare Other | Admitting: Cardiovascular Disease

## 2011-10-10 ENCOUNTER — Encounter: Payer: Self-pay | Admitting: Cardiovascular Disease

## 2011-10-10 VITALS — BP 170/90 | HR 76 | Ht 64.0 in | Wt 97.0 lb

## 2011-10-10 DIAGNOSIS — F17200 Nicotine dependence, unspecified, uncomplicated: Secondary | ICD-10-CM

## 2011-10-10 DIAGNOSIS — R0989 Other specified symptoms and signs involving the circulatory and respiratory systems: Secondary | ICD-10-CM | POA: Insufficient documentation

## 2011-10-10 DIAGNOSIS — I739 Peripheral vascular disease, unspecified: Secondary | ICD-10-CM

## 2011-10-10 MED ORDER — BUPROPION HCL ER (SR) 150 MG PO TB12: 150.0000 mg | ORAL_TABLET | Freq: Two times a day (BID) | ORAL | Status: DC

## 2011-10-10 NOTE — Assessment & Plan Note (Signed)
Stable. No rest pain or ulcerations. She does get claudication at 300 yards. Will continue ASA and Pletal. Repeat ABI one year.

## 2011-10-10 NOTE — Patient Instructions (Addendum)
Your physician wants you to follow-up in: 12 months.  You will receive a reminder letter in the mail two months in advance. If you don't receive a letter, please call our office to schedule the follow-up appointment.  Your physician has requested that you have a carotid duplex. This test is an ultrasound of the carotid arteries in your neck. It looks at blood flow through these arteries that supply the brain with blood. Allow one hour for this exam. There are no restrictions or special instructions.   Your physician has requested that you have an ankle brachial index (ABI). During this test an ultrasound and blood pressure cuff are used to evaluate the arteries that supply the arms and legs with blood. Allow thirty minutes for this exam. There are no restrictions or special instructions. To be done in 12 months.   Your physician has recommended you make the following change in your medication: Start Welbutrin SR 150 mg. Take once daily for 3 days and then increase to one tablet twice daily

## 2011-10-10 NOTE — Assessment & Plan Note (Signed)
Will try Wellbutrin for smoking cessation. She does not wish to try Chantix.

## 2011-10-10 NOTE — Assessment & Plan Note (Signed)
Will check carotid artery dopplers.  

## 2011-10-10 NOTE — Progress Notes (Signed)
History of Present Illness: 71 yo WF with history of PAD, tobacco abuse, HTN and hyperlipidemia who is here today for PV follow up. I saw her as a new pt in November 2011 for evaluation of PAD and most recently in June 2012 for follow up. Initial non-invasive studies showed chronic total occlusion of the left SFA with a long segment of total occlusion, reconstitution above the knee. There is moderate disease in the right SFA. Her ABI are moderately reduced at 0.76 on the right and 0.60 on the left. I recommended that we pursue a diagnostic angiogram if she wished but she refused. Most recent non-invasive studies April 2013 with right ABI 0.75 and left ABI 0.53.   She has been feeling great. She has pain in both legs when she walks a long ways. No rest pain or ulcerations. No chest pain or SOB. She is still smoking.    Primary Care Physician: Felicity Coyer  Past Medical History  Diagnosis Date  . SMOKER   . Peripheral vascular disease, unspecified dx 02/2010 ABI  . ANEMIA-NOS   . DYSLIPIDEMIA   . HYPERTENSION   . Hemorrhoids   . Osteoporosis, senile     Past Surgical History  Procedure Date  . Tubal ligation 1979  . Tonsillectomy 1946  . Hemorrhoid surgery 05/18/2011    Procedure: HEMORRHOIDECTOMY;  Surgeon: Robyne Askew, MD;  Location: [facility];  Service: General;  Laterality: N/A;  hemorrhoidectomy, three columns, internal and external    Current Outpatient Prescriptions  Medication Sig Dispense Refill  . amLODipine (NORVASC) 5 MG tablet Take 5 mg by mouth at bedtime.       Marland Kitchen aspirin 81 MG tablet Take 81 mg by mouth daily before breakfast.       . atorvastatin (LIPITOR) 20 MG tablet TAKE 1 TABLET BY MOUTH DAILY  30 tablet  5  . calcium-vitamin D (OSCAL WITH D) 500-200 MG-UNIT per tablet Take 2 tablets by mouth daily.      . cilostazol (PLETAL) 50 MG tablet Take 1 tablet (50 mg total) by mouth 2 (two) times daily.  60 tablet  5  . HYDROcodone-acetaminophen (NORCO) 10-325 MG per tablet  Take 1 tablet by mouth every 6 (six) hours as needed for pain.  40 tablet  2  . losartan-hydrochlorothiazide (HYZAAR) 50-12.5 MG per tablet Take 1 tablet by mouth daily.  30 tablet  5   No current facility-administered medications for this visit.   Facility-Administered Medications Ordered in Other Visits  Medication Dose Route Frequency Provider Last Rate Last Dose  . sodium phosphate (FLEET) 7-19 GM/118ML enema 1 enema  1 enema Rectal Once Caleen Essex III, MD        No Known Allergies  History   Social History  . Marital Status: Single    Spouse Name: N/A    Number of Children: N/A  . Years of Education: N/A   Occupational History  . Not on file.   Social History Main Topics  . Smoking status: Current Some Day Smoker -- 0.5 packs/day for 50 years    Types: Cigarettes  . Smokeless tobacco: Never Used   Comment: Single, lives alone. Retired Pensions consultant. Enjoys Agricultural consultant work.  . Alcohol Use: Yes     Social  . Drug Use: No  . Sexually Active: Not on file   Other Topics Concern  . Not on file   Social History Narrative  . No narrative on file    Family History  Problem Relation Age of Onset  . Breast cancer Mother   . Cancer Mother     breast  . Hypertension Brother   . Lung cancer Other   . Hypertension Other   . Cancer Father     lung    Review of Systems:  As stated in the HPI and otherwise negative.   BP 170/90  Pulse 76  Ht 5\' 4"  (1.626 m)  Wt 97 lb (43.999 kg)  BMI 16.65 kg/m2  Physical Examination: General: Well developed, well nourished, NAD HEENT: OP clear, mucus membranes moist SKIN: warm, dry. No rashes. Neuro: No focal deficits Musculoskeletal: Muscle strength 5/5 all ext Psychiatric: Mood and affect normal Neck: No JVD, right carotid bruit, no thyromegaly, no lymphadenopathy. Lungs:Clear bilaterally, no wheezes, rhonci, crackles Cardiovascular: Regular rate and rhythm. No murmurs, gallops or rubs. Abdomen:Soft. Bowel sounds  present. Non-tender.  Extremities: No lower extremity edema. Pulses are 1+ in the right and non-palpalbe in the left DP/PT.

## 2011-12-01 ENCOUNTER — Encounter (INDEPENDENT_AMBULATORY_CARE_PROVIDER_SITE_OTHER): Payer: Medicare Other

## 2011-12-01 DIAGNOSIS — R0989 Other specified symptoms and signs involving the circulatory and respiratory systems: Secondary | ICD-10-CM

## 2011-12-01 DIAGNOSIS — I6529 Occlusion and stenosis of unspecified carotid artery: Secondary | ICD-10-CM

## 2011-12-12 ENCOUNTER — Telehealth: Payer: Self-pay | Admitting: *Deleted

## 2011-12-12 NOTE — Telephone Encounter (Signed)
Vita Erm, MD       Sent: Caleen Essex December 08, 2011  5:19 PM      Message     Bilateral carotid plaque. Can we let her know. 6 month f/u. cdm     Above copied from message from Dr. Clifton James.  I called pt and gave her this information.

## 2011-12-18 ENCOUNTER — Ambulatory Visit: Payer: Medicare Other | Admitting: Internal Medicine

## 2012-01-01 ENCOUNTER — Encounter: Payer: Self-pay | Admitting: Internal Medicine

## 2012-01-01 ENCOUNTER — Ambulatory Visit (INDEPENDENT_AMBULATORY_CARE_PROVIDER_SITE_OTHER): Payer: Medicare Other | Admitting: Internal Medicine

## 2012-01-01 ENCOUNTER — Other Ambulatory Visit (INDEPENDENT_AMBULATORY_CARE_PROVIDER_SITE_OTHER): Payer: Medicare Other

## 2012-01-01 VITALS — BP 130/80 | HR 106 | Temp 98.0°F | Ht 64.0 in | Wt 96.8 lb

## 2012-01-01 DIAGNOSIS — I739 Peripheral vascular disease, unspecified: Secondary | ICD-10-CM

## 2012-01-01 DIAGNOSIS — E785 Hyperlipidemia, unspecified: Secondary | ICD-10-CM

## 2012-01-01 DIAGNOSIS — Z79899 Other long term (current) drug therapy: Secondary | ICD-10-CM

## 2012-01-01 DIAGNOSIS — Z23 Encounter for immunization: Secondary | ICD-10-CM

## 2012-01-01 DIAGNOSIS — I10 Essential (primary) hypertension: Secondary | ICD-10-CM

## 2012-01-01 LAB — LIPID PANEL
HDL: 85 mg/dL (ref >39.00)
Triglycerides: 58 mg/dL (ref 0.0–149.0)
VLDL: 11.6 mg/dL (ref 0.0–40.0)

## 2012-01-01 LAB — HEPATIC FUNCTION PANEL
ALT: 20 U/L (ref 0–35)
AST: 27 U/L (ref 0–37)
Albumin: 4.2 g/dL (ref 3.5–5.2)
Total Bilirubin: 0.3 mg/dL (ref 0.3–1.2)
Total Protein: 7.6 g/dL (ref 6.0–8.3)

## 2012-01-01 MED ORDER — ATORVASTATIN CALCIUM 20 MG PO TABS: 20.0000 mg | ORAL_TABLET | Freq: Every day | ORAL | Status: DC

## 2012-01-01 NOTE — Assessment & Plan Note (Signed)
On statin since 07/2009 - The current medical regimen is effective;  continue present plan and medications.  

## 2012-01-01 NOTE — Patient Instructions (Addendum)
It was good to see you today. We have reviewed your prior records including labs and tests today Test(s) ordered today. Your results will be called to you after review (48-72hours after test completion). If any changes need to be made, you will be notified at that time. Medications reviewed, no changes at this time. Please schedule followup in 6 months, call sooner if problems. Smoking Cessation This document explains the best ways for you to quit smoking and new treatments to help. It lists new medicines that can double or triple your chances of quitting and quitting for good. It also considers ways to avoid relapses and concerns you may have about quitting, including weight gain. NICOTINE: A POWERFUL ADDICTION If you have tried to quit smoking, you know how hard it can be. It is hard because nicotine is a very addictive drug. For some people, it can be as addictive as heroin or cocaine. Usually, people make 2 or 3 tries, or more, before finally being able to quit. Each time you try to quit, you can learn about what helps and what hurts. Quitting takes hard work and a lot of effort, but you can quit smoking. QUITTING SMOKING IS ONE OF THE MOST IMPORTANT THINGS YOU WILL EVER DO.  You will live longer, feel better, and live better.   The impact on your body of quitting smoking is felt almost immediately:   Within 20 minutes, blood pressure decreases. Pulse returns to its normal level.   After 8 hours, carbon monoxide levels in the blood return to normal. Oxygen level increases.   After 24 hours, chance of heart attack starts to decrease. Breath, hair, and body stop smelling like smoke.   After 48 hours, damaged nerve endings begin to recover. Sense of taste and smell improve.   After 72 hours, the body is virtually free of nicotine. Bronchial tubes relax and breathing becomes easier.   After 2 to 12 weeks, lungs can hold more air. Exercise becomes easier and circulation improves.   Quitting  will reduce your risk of having a heart attack, stroke, cancer, or lung disease:   After 1 year, the risk of coronary heart disease is cut in half.   After 5 years, the risk of stroke falls to the same as a nonsmoker.   After 10 years, the risk of lung cancer is cut in half and the risk of other cancers decreases significantly.   After 15 years, the risk of coronary heart disease drops, usually to the level of a nonsmoker.   If you are pregnant, quitting smoking will improve your chances of having a healthy baby.   The people you live with, especially your children, will be healthier.   You will have extra money to spend on things other than cigarettes.  FIVE KEYS TO QUITTING Studies have shown that these 5 steps will help you quit smoking and quit for good. You have the best chances of quitting if you use them together: 1. Get ready.  2. Get support and encouragement.  3. Learn new skills and behaviors.  4. Get medicine to reduce your nicotine addiction and use it correctly.  5. Be prepared for relapse or difficult situations. Be determined to continue trying to quit, even if you do not succeed at first.  1. GET READY  Set a quit date.   Change your environment.   Get rid of ALL cigarettes, ashtrays, matches, and lighters in your home, car, and place of work.   Do not  let people smoke in your home.   Review your past attempts to quit. Think about what worked and what did not.   Once you quit, do not smoke. NOT EVEN A PUFF!  2. GET SUPPORT AND ENCOURAGEMENT Studies have shown that you have a better chance of being successful if you have help. You can get support in many ways.  Tell your family, friends, and coworkers that you are going to quit and need their support. Ask them not to smoke around you.   Talk to your caregivers (doctor, dentist, nurse, pharmacist, psychologist, and/or smoking counselor).   Get individual, group, or telephone counseling and support. The more  counseling you have, the better your chances are of quitting. Programs are available at [employer] and health centers. Call your local health department for information about programs in your area.   Spiritual beliefs and practices may help some smokers quit.   Quit meters are Photographer that keep track of quit statistics, such as amount of "quit-time," cigarettes not smoked, and money saved.   Many smokers find one or more of the many self-help books available useful in helping them quit and stay off tobacco.  3. LEARN NEW SKILLS AND BEHAVIORS  Try to distract yourself from urges to smoke. Talk to someone, go for a walk, or occupy your time with a task.   When you first try to quit, change your routine. Take a different route to work. Drink tea instead of coffee. Eat breakfast in a different place.   Do something to reduce your stress. Take a hot bath, exercise, or read a book.   Plan something enjoyable to do every day. Reward yourself for not smoking.   Explore interactive web-based programs that specialize in helping you quit.  4. GET MEDICINE AND USE IT CORRECTLY Medicines can help you stop smoking and decrease the urge to smoke. Combining medicine with the above behavioral methods and support can quadruple your chances of successfully quitting smoking. The [place] Food and Drug Administration (FDA) has approved 7 medicines to help you quit smoking. These medicines fall into 3 categories.  Nicotine replacement therapy (delivers nicotine to your body without the negative effects and risks of smoking):   Nicotine gum: Available over-the-counter.   Nicotine lozenges: Available over-the-counter.   Nicotine inhaler: Available by prescription.   Nicotine nasal spray: Available by prescription.   Nicotine skin patches (transdermal): Available by prescription and over-the-counter.   Antidepressant medicine (helps people abstain from smoking, but  how this works is unknown):   Bupropion sustained-release (SR) tablets: Available by prescription.   Nicotinic receptor partial agonist (simulates the effect of nicotine in your brain):   Varenicline tartrate tablets: Available by prescription.   Ask your caregiver for advice about which medicines to use and how to use them. Carefully read the information on the package.   Everyone who is trying to quit may benefit from using a medicine. If you are pregnant or trying to become pregnant, nursing an infant, you are under age 90, or you smoke fewer than 10 cigarettes per day, talk to your caregiver before taking any nicotine replacement medicines.   You should stop using a nicotine replacement product and call your caregiver if you experience nausea, dizziness, weakness, vomiting, fast or irregular heartbeat, mouth problems with the lozenge or gum, or redness or swelling of the skin around the patch that does not go away.   Do not use any other product containing nicotine  while using a nicotine replacement product.   Talk to your caregiver before using these products if you have diabetes, heart disease, asthma, stomach ulcers, you had a recent heart attack, you have high blood pressure that is not controlled with medicine, a history of irregular heartbeat, or you have been prescribed medicine to help you quit smoking.  5. BE PREPARED FOR RELAPSE OR DIFFICULT SITUATIONS  Most relapses occur within the first 3 months after quitting. Do not be discouraged if you start smoking again. Remember, most people try several times before they finally quit.   You may have symptoms of withdrawal because your body is used to nicotine. You may crave cigarettes, be irritable, feel very hungry, cough often, get headaches, or have difficulty concentrating.   The withdrawal symptoms are only temporary. They are strongest when you first quit, but they will go away within 10 to 14 days.  Here are some difficult  situations to watch for:  Alcohol. Avoid drinking alcohol. Drinking lowers your chances of successfully quitting.   Caffeine. Try to reduce the amount of caffeine you consume. It also lowers your chances of successfully quitting.   Other smokers. Being around smoking can make you want to smoke. Avoid smokers.   Weight gain. Many smokers will gain weight when they quit, usually less than 10 pounds. Eat a healthy diet and stay active. Do not let weight gain distract you from your main goal, quitting smoking. Some medicines that help you quit smoking may also help delay weight gain. You can always lose the weight gained after you quit.   Bad mood or depression. There are a lot of ways to improve your mood other than smoking.  If you are having problems with any of these situations, talk to your caregiver. SPECIAL SITUATIONS AND CONDITIONS Studies suggest that everyone can quit smoking. Your situation or condition can give you a special reason to quit.  Pregnant women/new mothers: By quitting, you protect your baby's health and your own.   Hospitalized patients: By quitting, you reduce health problems and help healing.   Heart attack patients: By quitting, you reduce your risk of a second heart attack.   Lung, head, and neck cancer patients: By quitting, you reduce your chance of a second cancer.   Parents of children and adolescents: By quitting, you protect your children from illnesses caused by secondhand smoke.  QUESTIONS TO THINK ABOUT Think about the following questions before you try to stop smoking. You may want to talk about your answers with your caregiver.  Why do you want to quit?   If you tried to quit in the past, what helped and what did not?   What will be the most difficult situations for you after you quit? How will you plan to handle them?   Who can help you through the tough times? Your family? Friends? Caregiver?   What pleasures do you get from smoking? What ways  can you still get pleasure if you quit?  Here are some questions to ask your caregiver:  How can you help me to be successful at quitting?   What medicine do you think would be best for me and how should I take it?   What should I do if I need more help?   What is smoking withdrawal like? How can I get information on withdrawal?  Quitting takes hard work and a lot of effort, but you can quit smoking. FOR MORE INFORMATION   Smokefree.gov (http://www.davis-sullivan.com/) provides free,  accurate, evidence-based information and professional assistance to help support the immediate and long-term needs of people trying to quit smoking. Document Released: 04/04/2001 Document Revised: 03/30/2011 Document Reviewed: 01/25/2009 [facility] Patient Information 2012 [place], [place].

## 2012-01-01 NOTE — Progress Notes (Signed)
  Subjective:    Patient ID: Jennifer [name], female    DOB: 13-Sep-1940, 71 y.o.   MRN: [id number]  HPI  Here for follow up - reviewed chronic medical issues:  hypertension - intolerant of beta-blocker due to fatigue and ACEI due to cough; reports compliance with ongoing medical treatment and no changes in medication dose or frequency. denies adverse side effects related to current therapy.  no chest pain, no headache, no edema  dyslipidemia - statin began 07/2009 - reports compliance with ongoing medical treatment - no changes in medication dose or frequency. denies adverse side effects related to current therapy.   smoking- understands need to quit and is working on same. does not feel meds or self-help classes would help, attributes cost of smoking as motivating factor in desire to quit - tried wellbutrin 09/2011 ineffective  PAD/PVD - abn ABI 02/2010, improved on f/u ABI 08/2010 - follows with periph cards who initially recommended intervention but pt opted for medical management only - on pletal + ASA; symptoms much improved, less claudication in legs: "only hurts if i walk fast" -pain relieved with cessation of walking/rest  Past Medical History  Diagnosis Date  . SMOKER   . Peripheral vascular disease, unspecified dx 02/2010 ABI  . ANEMIA-NOS   . DYSLIPIDEMIA   . HYPERTENSION   . Hemorrhoids   . Osteoporosis, senile    Review of Systems  Constitutional: Negative for fatigue and unexpected weight change.  Respiratory: Negative for cough and shortness of breath.   Cardiovascular: Negative for chest pain and leg swelling.  Neurological: Negative for headaches.       Objective:   Physical Exam BP 130/80  Pulse 106  Temp 98 F (36.7 C) (Oral)  Ht 5\' 4"  (1.626 m)  Wt 96 lb 12.8 oz (43.908 kg)  BMI 16.62 kg/m2  SpO2 98% Wt Readings from Last 3 Encounters:  01/01/12 96 lb 12.8 oz (43.908 kg)  10/10/11 97 lb (43.999 kg)  09/11/11 95 lb (43.092 kg)   Constitutional: She is  thin, smells heavily of tobacco - but appears well-developed and well-nourished. No distress.  Neck: Normal range of motion. Neck supple. No JVD present. No thyromegaly present.  Cardiovascular: Normal rate, regular rhythm and normal heart sounds.  No murmur heard. No BLE edema. Pulmonary/Chest: Effort normal and breath sounds normal. No respiratory distress. She has no wheezes. Psychiatric: She has a normal mood and affect. Her behavior is normal. Judgment and thought content normal.   Lab Results  Component Value Date   WBC 8.9 05/11/2011   HGB 12.1 05/11/2011   HCT 35.8* 05/11/2011   PLT 357 05/11/2011   GLUCOSE 86 05/11/2011   CHOL 188 03/08/2011   TRIG 68.0 03/08/2011   HDL 75.80 03/08/2011   LDLDIRECT 232.1 08/09/2009   LDLCALC 99 03/08/2011   ALT 13 11/08/2009   AST 22 11/08/2009   NA 136 05/11/2011   K 3.9 05/18/2011   CL 97 05/11/2011   CREATININE 0.67 05/11/2011   BUN 11 05/11/2011   CO2 28 05/11/2011   TSH 1.84 08/15/2010        Assessment & Plan:  See problem list. Medications and labs reviewed today.

## 2012-01-01 NOTE — Assessment & Plan Note (Signed)
Home log reviewed - improved - Intolerant of ACEI due to cough, intolerant of lopressor due to fatigue Added HCTZ 07/2010 to other medications - changed to ARB-diuretic combo 02/2011 and titrated to max 10/2011  BP Readings from Last 3 Encounters:  01/01/12 130/80  10/10/11 170/90  09/11/11 152/100

## 2012-01-01 NOTE — Assessment & Plan Note (Signed)
Seen by mcalhany 02/2010 for same -  ABIs reviewed - improved on 08/2010 ABI test - stable 07/2011 ABI Carotid ultrasound with 60-79% ICAS on left on pletal + ASA, less smoking and statin - improved pain symptoms on same Continue to work on risk factor modification and follow up cards as planned

## 2012-01-03 ENCOUNTER — Other Ambulatory Visit: Payer: Self-pay

## 2012-01-03 MED ORDER — CILOSTAZOL 50 MG PO TABS: 50.0000 mg | ORAL_TABLET | Freq: Two times a day (BID) | ORAL | Status: DC

## 2012-02-07 ENCOUNTER — Other Ambulatory Visit: Payer: Self-pay

## 2012-02-07 MED ORDER — AMLODIPINE BESYLATE 5 MG PO TABS: 5.0000 mg | ORAL_TABLET | Freq: Every day | ORAL | Status: DC

## 2012-05-08 ENCOUNTER — Other Ambulatory Visit: Payer: Self-pay | Admitting: *Deleted

## 2012-05-08 MED ORDER — AMLODIPINE BESYLATE 5 MG PO TABS: 5.0000 mg | ORAL_TABLET | Freq: Every day | ORAL | Status: DC

## 2012-06-05 ENCOUNTER — Other Ambulatory Visit: Payer: Self-pay | Admitting: *Deleted

## 2012-06-05 MED ORDER — ATORVASTATIN CALCIUM 20 MG PO TABS: 20.0000 mg | ORAL_TABLET | Freq: Every day | ORAL | Status: DC

## 2012-06-30 ENCOUNTER — Other Ambulatory Visit: Payer: Self-pay | Admitting: Internal Medicine

## 2012-07-01 ENCOUNTER — Encounter: Payer: Self-pay | Admitting: Internal Medicine

## 2012-07-01 ENCOUNTER — Ambulatory Visit (INDEPENDENT_AMBULATORY_CARE_PROVIDER_SITE_OTHER): Payer: Medicare Other | Admitting: Internal Medicine

## 2012-07-01 VITALS — BP 148/90 | HR 108 | Temp 98.0°F | Wt 95.1 lb

## 2012-07-01 DIAGNOSIS — I10 Essential (primary) hypertension: Secondary | ICD-10-CM

## 2012-07-01 DIAGNOSIS — I739 Peripheral vascular disease, unspecified: Secondary | ICD-10-CM

## 2012-07-01 DIAGNOSIS — E785 Hyperlipidemia, unspecified: Secondary | ICD-10-CM

## 2012-07-01 DIAGNOSIS — F411 Generalized anxiety disorder: Secondary | ICD-10-CM | POA: Insufficient documentation

## 2012-07-01 MED ORDER — PAROXETINE HCL 10 MG PO TABS: 10.0000 mg | ORAL_TABLET | ORAL | Status: DC

## 2012-07-01 MED ORDER — ALPRAZOLAM 0.25 MG PO TABS: 0.2500 mg | ORAL_TABLET | Freq: Three times a day (TID) | ORAL | Status: DC | PRN

## 2012-07-01 NOTE — Assessment & Plan Note (Signed)
Home log reviewed - improved - Intolerant of ACEI due to cough, intolerant of lopressor due to fatigue Added HCTZ 07/2010 to other medications - changed to ARB-diuretic combo 02/2011 and titrated to max 10/2011  BP Readings from Last 3 Encounters:  07/01/12 148/90  01/01/12 130/80  10/10/11 170/90

## 2012-07-01 NOTE — Patient Instructions (Signed)
It was good to see you today. We have reviewed your prior records including labs and tests today Low-dose generic Paxil once daily and Xanax as needed for anxiety symptoms -Your prescription(s) have been submitted to your pharmacy. Please take as directed and contact our office if you believe you are having problem(s) with the medication(s). Other medications reviewed and updated, no other changes Followup with Dr. Sanjuana Kava this summer as planned Please schedule followup in 6 months, call sooner if problems. Let me know if you're interested in Chantix to help you quit smoking  Smoking Cessation This document explains the best ways for you to quit smoking and new treatments to help. It lists new medicines that can double or triple your chances of quitting and quitting for good. It also considers ways to avoid relapses and concerns you may have about quitting, including weight gain. NICOTINE: A POWERFUL ADDICTION If you have tried to quit smoking, you know how hard it can be. It is hard because nicotine is a very addictive drug. For some people, it can be as addictive as heroin or cocaine. Usually, people make 2 or 3 tries, or more, before finally being able to quit. Each time you try to quit, you can learn about what helps and what hurts. Quitting takes hard work and a lot of effort, but you can quit smoking. QUITTING SMOKING IS ONE OF THE MOST IMPORTANT THINGS YOU WILL EVER DO.  You will live longer, feel better, and live better.   The impact on your body of quitting smoking is felt almost immediately:   Within 20 minutes, blood pressure decreases. Pulse returns to its normal level.   After 8 hours, carbon monoxide levels in the blood return to normal. Oxygen level increases.   After 24 hours, chance of heart attack starts to decrease. Breath, hair, and body stop smelling like smoke.   After 48 hours, damaged nerve endings begin to recover. Sense of taste and smell improve.   After 72  hours, the body is virtually free of nicotine. Bronchial tubes relax and breathing becomes easier.   After 2 to 12 weeks, lungs can hold more air. Exercise becomes easier and circulation improves.   Quitting will reduce your risk of having a heart attack, stroke, cancer, or lung disease:   After 1 year, the risk of coronary heart disease is cut in half.   After 5 years, the risk of stroke falls to the same as a nonsmoker.   After 10 years, the risk of lung cancer is cut in half and the risk of other cancers decreases significantly.   After 15 years, the risk of coronary heart disease drops, usually to the level of a nonsmoker.   If you are pregnant, quitting smoking will improve your chances of having a healthy baby.   The people you live with, especially your children, will be healthier.   You will have extra money to spend on things other than cigarettes.  FIVE KEYS TO QUITTING Studies have shown that these 5 steps will help you quit smoking and quit for good. You have the best chances of quitting if you use them together: 1. Get ready.  2. Get support and encouragement.  3. Learn new skills and behaviors.  4. Get medicine to reduce your nicotine addiction and use it correctly.  5. Be prepared for relapse or difficult situations. Be determined to continue trying to quit, even if you do not succeed at first.  1. GET READY  Set  a quit date.   Change your environment.   Get rid of ALL cigarettes, ashtrays, matches, and lighters in your home, car, and place of work.   Do not let people smoke in your home.   Review your past attempts to quit. Think about what worked and what did not.   Once you quit, do not smoke. NOT EVEN A PUFF!  2. GET SUPPORT AND ENCOURAGEMENT Studies have shown that you have a better chance of being successful if you have help. You can get support in many ways.  Tell your family, friends, and coworkers that you are going to quit and need their support. Ask  them not to smoke around you.   Talk to your caregivers (doctor, dentist, nurse, pharmacist, psychologist, and/or smoking counselor).   Get individual, group, or telephone counseling and support. The more counseling you have, the better your chances are of quitting. Programs are available at [employer] and health centers. Call your local health department for information about programs in your area.   Spiritual beliefs and practices may help some smokers quit.   Quit meters are Photographer that keep track of quit statistics, such as amount of "quit-time," cigarettes not smoked, and money saved.   Many smokers find one or more of the many self-help books available useful in helping them quit and stay off tobacco.  3. LEARN NEW SKILLS AND BEHAVIORS  Try to distract yourself from urges to smoke. Talk to someone, go for a walk, or occupy your time with a task.   When you first try to quit, change your routine. Take a different route to work. Drink tea instead of coffee. Eat breakfast in a different place.   Do something to reduce your stress. Take a hot bath, exercise, or read a book.   Plan something enjoyable to do every day. Reward yourself for not smoking.   Explore interactive web-based programs that specialize in helping you quit.  4. GET MEDICINE AND USE IT CORRECTLY Medicines can help you stop smoking and decrease the urge to smoke. Combining medicine with the above behavioral methods and support can quadruple your chances of successfully quitting smoking. The U.[place]. Food and Drug Administration (FDA) has approved 7 medicines to help you quit smoking. These medicines fall into 3 categories.  Nicotine replacement therapy (delivers nicotine to your body without the negative effects and risks of smoking):   Nicotine gum: Available over-the-counter.   Nicotine lozenges: Available over-the-counter.   Nicotine inhaler: Available by prescription.    Nicotine nasal spray: Available by prescription.   Nicotine skin patches (transdermal): Available by prescription and over-the-counter.   Antidepressant medicine (helps people abstain from smoking, but how this works is unknown):   Bupropion sustained-release (SR) tablets: Available by prescription.   Nicotinic receptor partial agonist (simulates the effect of nicotine in your brain):   Varenicline tartrate tablets: Available by prescription.   Ask your caregiver for advice about which medicines to use and how to use them. Carefully read the information on the package.   Everyone who is trying to quit may benefit from using a medicine. If you are pregnant or trying to become pregnant, nursing an infant, you are under age 81, or you smoke fewer than 10 cigarettes per day, talk to your caregiver before taking any nicotine replacement medicines.   You should stop using a nicotine replacement product and call your caregiver if you experience nausea, dizziness, weakness, vomiting, fast or irregular heartbeat, mouth  problems with the lozenge or gum, or redness or swelling of the skin around the patch that does not go away.   Do not use any other product containing nicotine while using a nicotine replacement product.   Talk to your caregiver before using these products if you have diabetes, heart disease, asthma, stomach ulcers, you had a recent heart attack, you have high blood pressure that is not controlled with medicine, a history of irregular heartbeat, or you have been prescribed medicine to help you quit smoking.  5. BE PREPARED FOR RELAPSE OR DIFFICULT SITUATIONS  Most relapses occur within the first 3 months after quitting. Do not be discouraged if you start smoking again. Remember, most people try several times before they finally quit.   You may have symptoms of withdrawal because your body is used to nicotine. You may crave cigarettes, be irritable, feel very hungry, cough often,  get headaches, or have difficulty concentrating.   The withdrawal symptoms are only temporary. They are strongest when you first quit, but they will go away within 10 to 14 days.  Here are some difficult situations to watch for:  Alcohol. Avoid drinking alcohol. Drinking lowers your chances of successfully quitting.   Caffeine. Try to reduce the amount of caffeine you consume. It also lowers your chances of successfully quitting.   Other smokers. Being around smoking can make you want to smoke. Avoid smokers.   Weight gain. Many smokers will gain weight when they quit, usually less than 10 pounds. Eat a healthy diet and stay active. Do not let weight gain distract you from your main goal, quitting smoking. Some medicines that help you quit smoking may also help delay weight gain. You can always lose the weight gained after you quit.   Bad mood or depression. There are a lot of ways to improve your mood other than smoking.  If you are having problems with any of these situations, talk to your caregiver. SPECIAL SITUATIONS AND CONDITIONS Studies suggest that everyone can quit smoking. Your situation or condition can give you a special reason to quit.  Pregnant women/new mothers: By quitting, you protect your baby's health and your own.   Hospitalized patients: By quitting, you reduce health problems and help healing.   Heart attack patients: By quitting, you reduce your risk of a second heart attack.   Lung, head, and neck cancer patients: By quitting, you reduce your chance of a second cancer.   Parents of children and adolescents: By quitting, you protect your children from illnesses caused by secondhand smoke.  QUESTIONS TO THINK ABOUT Think about the following questions before you try to stop smoking. You may want to talk about your answers with your caregiver.  Why do you want to quit?   If you tried to quit in the past, what helped and what did not?   What will be the most  difficult situations for you after you quit? How will you plan to handle them?   Who can help you through the tough times? Your family? Friends? Caregiver?   What pleasures do you get from smoking? What ways can you still get pleasure if you quit?  Here are some questions to ask your caregiver:  How can you help me to be successful at quitting?   What medicine do you think would be best for me and how should I take it?   What should I do if I need more help?   What is smoking withdrawal like?  How can I get information on withdrawal?  Quitting takes hard work and a lot of effort, but you can quit smoking. FOR MORE INFORMATION   Smokefree.gov (http://www.davis-sullivan.com/) provides free, accurate, evidence-based information and professional assistance to help support the immediate and long-term needs of people trying to quit smoking. Document Released: 04/04/2001 Document Revised: 03/30/2011 Document Reviewed: 01/25/2009 [facility] Patient Information 2012 [place], [place].

## 2012-07-01 NOTE — Progress Notes (Signed)
  Subjective:    Patient ID: Jennifer [name], female    DOB: [birth year], 72 y.o.   MRN: [id number]  HPI  Here for follow up - reviewed chronic medical issues:  hypertension - intolerant of beta-blocker due to fatigue and ACEI due to cough; reports compliance with ongoing medical treatment and no changes in medication dose or frequency. denies adverse side effects related to current therapy.  no chest pain, no headache, no edema  dyslipidemia - statin began 07/2009 - reports compliance with ongoing medical treatment - no changes in medication dose or frequency. denies adverse side effects related to current therapy.   smoking- understands need to quit and is working on same. does not feel meds or self-help classes would help, attributes cost of smoking as motivating factor in desire to quit - intermittently using wellbutrin 09/2011 - not interested in Chantix  PAD/PVD - abn ABI 02/2010, improved on f/u ABI 08/2010 - follows with periph cards who initially recommended intervention but pt opted for medical management only - on pletal + ASA; symptoms much improved, less claudication in legs: "only hurts if i walk fast" -pain relieved with cessation of walking/rest  Past Medical History  Diagnosis Date  . SMOKER   . Peripheral vascular disease, unspecified dx 02/2010 ABI  . ANEMIA-NOS   . DYSLIPIDEMIA   . HYPERTENSION   . Hemorrhoids   . Osteoporosis, senile    Review of Systems  Constitutional: Negative for fatigue and unexpected weight change.  Respiratory: Negative for cough and shortness of breath.   Cardiovascular: Negative for chest pain and leg swelling.  Neurological: Negative for headaches.  Psychiatric/Behavioral: Positive for sleep disturbance and dysphoric mood. Negative for suicidal ideas. The patient is nervous/anxious.        Objective:   Physical Exam BP 148/90  Pulse 108  Temp(Src) 98 F (36.7 C) (Oral)  Wt 95 lb 1.9 oz (43.146 kg)  BMI 16.32 kg/m2  SpO2 97% Wt  Readings from Last 3 Encounters:  07/01/12 95 lb 1.9 oz (43.146 kg)  01/01/12 96 lb 12.8 oz (43.908 kg)  10/10/11 97 lb (43.999 kg)   Constitutional: She is thin, smells heavily of tobacco - but appears well-developed and well-nourished. No distress.  Neck: Normal range of motion. Neck supple. No JVD present. No thyromegaly present.  Cardiovascular: Normal rate, regular rhythm and normal heart sounds.  No murmur heard. No BLE edema. Pulmonary/Chest: Effort normal and breath sounds normal. No respiratory distress. She has no wheezes. Psychiatric: She has a normal mood and affect. Her behavior is normal. Judgment and thought content normal.   Lab Results  Component Value Date   WBC 8.9 05/11/2011   HGB 12.1 05/11/2011   HCT 35.8* 05/11/2011   PLT 357 05/11/2011   GLUCOSE 86 05/11/2011   CHOL 186 01/01/2012   TRIG 58.0 01/01/2012   HDL 85.00 01/01/2012   LDLDIRECT 232.1 08/09/2009   LDLCALC 89 01/01/2012   ALT 20 01/01/2012   AST 27 01/01/2012   NA 136 05/11/2011   K 3.9 05/18/2011   CL 97 05/11/2011   CREATININE 0.67 05/11/2011   BUN 11 05/11/2011   CO2 28 05/11/2011   TSH 1.84 08/15/2010        Assessment & Plan:  See problem list. Medications and labs reviewed today.

## 2012-07-01 NOTE — Assessment & Plan Note (Signed)
On statin since 07/2009 - unable to tolerate higher dose atorva due to myalgias The current medical regimen is effective;  continue present plan and medications. 

## 2012-07-01 NOTE — Assessment & Plan Note (Signed)
Seen by mcalhany 02/2010 for same -  ABIs reviewed - improved on 08/2010 ABI, - stable 07/2011 ABI Due for ABI recheck 09/2012 Carotid ultrasound with 60-79% ICAS on left on pletal + ASA, less smoking and statin - improved pain symptoms on same Continue to work on risk factor modification and follow up cards as planned

## 2012-07-01 NOTE — Assessment & Plan Note (Signed)
Chronic symptoms, exacerbated by family illness February 25, 2012 and death of her pet 26-Apr-2012 Will stop bupropion as ineffective for smoking cessation at this time Start low-dose paroxetine and use when necessary alprazolam for anxiety symptoms - erx done we reviewed potential risk/benefit and possible side effects - pt understands and agrees to same  Verified no SI/HI

## 2012-07-09 ENCOUNTER — Other Ambulatory Visit: Payer: Self-pay | Admitting: *Deleted

## 2012-07-09 MED ORDER — CILOSTAZOL 50 MG PO TABS: 50.0000 mg | ORAL_TABLET | Freq: Two times a day (BID) | ORAL | Status: DC

## 2012-07-10 ENCOUNTER — Other Ambulatory Visit: Payer: Self-pay | Admitting: *Deleted

## 2012-07-10 DIAGNOSIS — I10 Essential (primary) hypertension: Secondary | ICD-10-CM

## 2012-07-10 MED ORDER — LOSARTAN POTASSIUM-HCTZ 50-12.5 MG PO TABS: 1.0000 | ORAL_TABLET | Freq: Every day | ORAL | Status: DC

## 2012-07-11 ENCOUNTER — Encounter (INDEPENDENT_AMBULATORY_CARE_PROVIDER_SITE_OTHER): Payer: Medicare Other

## 2012-07-11 DIAGNOSIS — I6529 Occlusion and stenosis of unspecified carotid artery: Secondary | ICD-10-CM

## 2012-07-18 ENCOUNTER — Telehealth: Payer: Self-pay | Admitting: Cardiovascular Disease

## 2012-07-18 NOTE — Telephone Encounter (Signed)
Spoke with pt and reviewed carotid doppler results with her. She is aware they will need to be repeated in 6 months.

## 2012-07-18 NOTE — Telephone Encounter (Signed)
New Problem:    Patient called in returning your call.  Please will call back later.

## 2012-07-26 ENCOUNTER — Telehealth: Payer: Self-pay | Admitting: Internal Medicine

## 2012-07-26 NOTE — Telephone Encounter (Signed)
Patient Information:  Caller Name: Maureena  Phone: [phone]  Patient: Jennifer [name], Jennifer [name]  Gender: Female  DOB: Sep 03, 1940  Age: 72 Years  PCP: Rene Paci (Adults only)  Office Follow Up:  Does the office need to follow up with this patient?: No  Instructions For The Office: N/A   Symptoms  Reason For Call & Symptoms: pt states that she has poison ivy on arms between wrist and elbow  Reviewed Health History In EMR: Yes  Reviewed Medications In EMR: Yes  Reviewed Allergies In EMR: Yes  Reviewed Surgeries / Procedures: Yes  Date of Onset of Symptoms: 07/23/2012  Treatments Tried: calamine lotion  Treatments Tried Worked: No  Guideline(s) Used:  Poison Ivy - [facility] or [facility]  Disposition Per Guideline:   Home Care  Reason For Disposition Reached:   Poison [place], [place], or [place] with no complications  Advice Given:  Hydrocortisone Cream for Itching:   Apply 1% hydrocortisone cream 4 times a day to reduce itching. Use it for 5 days.  Apply Cold to the Area:  Soak the involved area in cool water for 20 minutes or massage it with an ice cube as often as necessary to reduce itching and oozing.  Oral Antihistamine Medication for Itching:   Antihistamines may cause sleepiness. Do not drink, drive, or operate dangerous machinery while taking antihistamines.  Call Back If:  It looks infected  You become worse.  Patient Will Follow Care Advice:  YES

## 2012-12-02 ENCOUNTER — Encounter: Payer: Self-pay | Admitting: Cardiovascular Disease

## 2012-12-02 ENCOUNTER — Ambulatory Visit (INDEPENDENT_AMBULATORY_CARE_PROVIDER_SITE_OTHER): Payer: Medicare Other | Admitting: Cardiovascular Disease

## 2012-12-02 VITALS — BP 160/91 | HR 81 | Ht 64.0 in | Wt 100.1 lb

## 2012-12-02 DIAGNOSIS — Z720 Tobacco use: Secondary | ICD-10-CM

## 2012-12-02 DIAGNOSIS — I739 Peripheral vascular disease, unspecified: Secondary | ICD-10-CM

## 2012-12-02 DIAGNOSIS — I779 Disorder of arteries and arterioles, unspecified: Secondary | ICD-10-CM

## 2012-12-02 DIAGNOSIS — F17200 Nicotine dependence, unspecified, uncomplicated: Secondary | ICD-10-CM

## 2012-12-02 NOTE — Patient Instructions (Addendum)
Your physician wants you to follow-up in:  12 months. You will receive a reminder letter in the mail two months in advance. If you don't receive a letter, please call our office to schedule the follow-up appointment.  Your physician has requested that you have an ankle brachial index (ABI). During this test an ultrasound and blood pressure cuff are used to evaluate the arteries that supply the arms and legs with blood. Allow thirty minutes for this exam. There are no restrictions or special instructions.   Your physician has requested that you have a carotid duplex. This test is an ultrasound of the carotid arteries in your neck. It looks at blood flow through these arteries that supply the brain with blood. Allow one hour for this exam. There are no restrictions or special instructions. To be done in September 2014

## 2012-12-02 NOTE — Progress Notes (Signed)
History of Present Illness: 72 yo WF with history of PAD, tobacco abuse, HTN and hyperlipidemia who is here today for PV follow up. I saw her as a new pt in November 2011 for evaluation of PAD and most recently in June 2013 for follow up. Initial non-invasive studies showed chronic total occlusion of the left SFA with a long segment of total occlusion, reconstitution above the knee. There is moderate disease in the right SFA. Her initial ABI in 2012 was moderately reduced at 0.76 on the right and 0.60 on the left. I recommended that we pursue a diagnostic angiogram if she wished but she refused. Most recent non-invasive studies April 2013 with right ABI 0.75 and left ABI 0.53. Carotid artery dopplers 60-79% LICA, 0-39% RICA stenosis March 2014.  She has been feeling great. She has pain in both legs when she walks a long ways. No rest pain or ulcerations. No chest pain or SOB. She is still smoking but cutting back.   Primary Care Physician: Felicity Coyer  Last Lipid Profile:Lipid Panel     Component Value Date/Time   CHOL 186 01/01/2012 1625   TRIG 58.0 01/01/2012 1625   HDL 85.00 01/01/2012 1625   CHOLHDL 2 01/01/2012 1625   VLDL 11.6 01/01/2012 1625   LDLCALC 89 01/01/2012 1625     Past Medical History  Diagnosis Date  . SMOKER   . Peripheral vascular disease, unspecified dx 02/2010 ABI  . ANEMIA-NOS   . DYSLIPIDEMIA   . HYPERTENSION   . Hemorrhoids   . Osteoporosis, senile     Past Surgical History  Procedure Laterality Date  . Tubal ligation  1979  . Tonsillectomy  1946  . Hemorrhoid surgery  05/18/2011    Procedure: HEMORRHOIDECTOMY;  Surgeon: Robyne Askew, MD;  Location: [facility];  Service: General;  Laterality: N/A;  hemorrhoidectomy, three columns, internal and external    Current Outpatient Prescriptions  Medication Sig Dispense Refill  . amLODipine (NORVASC) 5 MG tablet Take 1 tablet (5 mg total) by mouth at bedtime.  30 tablet  9  . aspirin 81 MG tablet Take 81 mg by mouth daily  before breakfast.       . atorvastatin (LIPITOR) 20 MG tablet TAKE 1 TABLET BY MOUTH DAILY  30 tablet  4  . calcium-vitamin D (OSCAL WITH D) 500-200 MG-UNIT per tablet Take 2 tablets by mouth daily.      . cilostazol (PLETAL) 50 MG tablet Take 1 tablet (50 mg total) by mouth 2 (two) times daily.  60 tablet  5  . losartan-hydrochlorothiazide (HYZAAR) 50-12.5 MG per tablet Take 1 tablet by mouth daily.  30 tablet  5  . PARoxetine (PAXIL) 10 MG tablet Take 1 tablet (10 mg total) by mouth every morning.  30 tablet  5   No current facility-administered medications for this visit.    No Known Allergies  History   Social History  . Marital Status: Single    Spouse Name: N/A    Number of Children: N/A  . Years of Education: N/A   Occupational History  . Not on file.   Social History Main Topics  . Smoking status: Current Some Day Smoker -- 0.50 packs/day for 50 years    Types: Cigarettes  . Smokeless tobacco: Never Used     Comment: Single, lives alone. Retired Pensions consultant. Enjoys Agricultural consultant work.  . Alcohol Use: Yes     Comment: Social  . Drug Use: No  . Sexually Active: Not  on file   Other Topics Concern  . Not on file   Social History Narrative  . No narrative on file    Family History  Problem Relation Age of Onset  . Breast cancer Mother   . Cancer Mother     breast  . Hypertension Brother   . Lung cancer Other   . Hypertension Other   . Cancer Father     lung    Review of Systems:  As stated in the HPI and otherwise negative.   BP 160/91  Pulse 81  Ht 5\' 4"  (1.626 m)  Wt 100 lb 1.9 oz (45.414 kg)  BMI 17.18 kg/m2  Physical Examination: General: Well developed, well nourished, NAD HEENT: OP clear, mucus membranes moist SKIN: warm, dry. No rashes. Neuro: No focal deficits Musculoskeletal: Muscle strength 5/5 all ext Psychiatric: Mood and affect normal Neck: No JVD, no carotid bruits, no thyromegaly, no lymphadenopathy. Lungs:Clear bilaterally, no  wheezes, rhonci, crackles Cardiovascular: Regular rate and rhythm. No murmurs, gallops or rubs. Abdomen:Soft. Bowel sounds present. Non-tender.  Extremities: No lower extremity edema. Pulses are 2 + in the right DP/PT and not palpable DP/PT.  EKG: NSR, rate 81 bpm. LVH. Non-specific ST and T wave abnormalities.   Assessment and Plan:   1. PAD:  Stable. No rest pain or ulcerations. She does get claudication at 300 yards. Will continue ASA and Pletal. Repeat ABI now.   2. Carotid artery disease: 60-79% LICA, 0-39% RICA stenosis by dopplers March 2014. Repeat September 2014.   3. Tobacco abuse: She is trying to stop.

## 2012-12-26 ENCOUNTER — Other Ambulatory Visit: Payer: Self-pay | Admitting: *Deleted

## 2012-12-26 MED ORDER — PAROXETINE HCL 10 MG PO TABS: 10.0000 mg | ORAL_TABLET | ORAL | Status: DC

## 2012-12-30 ENCOUNTER — Encounter (INDEPENDENT_AMBULATORY_CARE_PROVIDER_SITE_OTHER): Payer: Medicare Other

## 2012-12-30 DIAGNOSIS — I739 Peripheral vascular disease, unspecified: Secondary | ICD-10-CM

## 2013-01-01 ENCOUNTER — Ambulatory Visit (INDEPENDENT_AMBULATORY_CARE_PROVIDER_SITE_OTHER): Payer: Medicare Other | Admitting: Internal Medicine

## 2013-01-01 ENCOUNTER — Telehealth: Payer: Self-pay | Admitting: Cardiovascular Disease

## 2013-01-01 ENCOUNTER — Encounter: Payer: Self-pay | Admitting: Internal Medicine

## 2013-01-01 ENCOUNTER — Other Ambulatory Visit (INDEPENDENT_AMBULATORY_CARE_PROVIDER_SITE_OTHER): Payer: Medicare Other

## 2013-01-01 VITALS — BP 128/82 | HR 82 | Temp 98.0°F | Wt 99.2 lb

## 2013-01-01 DIAGNOSIS — E785 Hyperlipidemia, unspecified: Secondary | ICD-10-CM

## 2013-01-01 DIAGNOSIS — R5381 Other malaise: Secondary | ICD-10-CM

## 2013-01-01 DIAGNOSIS — Z23 Encounter for immunization: Secondary | ICD-10-CM

## 2013-01-01 DIAGNOSIS — F411 Generalized anxiety disorder: Secondary | ICD-10-CM

## 2013-01-01 DIAGNOSIS — I10 Essential (primary) hypertension: Secondary | ICD-10-CM

## 2013-01-01 LAB — CBC WITH DIFFERENTIAL/PLATELET
Eosinophils Absolute: 0.1 10*3/uL (ref 0.0–0.7)
HCT: 36.4 % (ref 36.0–46.0)
Lymphs Abs: 2.5 10*3/uL (ref 0.7–4.0)
MCHC: 34.4 g/dL (ref 30.0–36.0)
MCV: 88.3 fl (ref 78.0–100.0)
Monocytes Absolute: 0.8 10*3/uL (ref 0.1–1.0)
Neutrophils Relative %: 58.7 % (ref 43.0–77.0)
Platelets: 322 10*3/uL (ref 150.0–400.0)
RDW: 13.9 % (ref 11.5–14.6)

## 2013-01-01 LAB — HEPATIC FUNCTION PANEL
Alkaline Phosphatase: 44 U/L (ref 39–117)
Bilirubin, Direct: 0 mg/dL (ref 0.0–0.3)
Total Bilirubin: 0.4 mg/dL (ref 0.3–1.2)
Total Protein: 7.7 g/dL (ref 6.0–8.3)

## 2013-01-01 LAB — BASIC METABOLIC PANEL
BUN: 15 mg/dL (ref 6–23)
CO2: 30 mEq/L (ref 19–32)
Chloride: 92 mEq/L — ABNORMAL LOW (ref 96–112)
Creatinine, Ser: 0.6 mg/dL (ref 0.4–1.2)
Glucose, Bld: 88 mg/dL (ref 70–99)
Potassium: 4 mEq/L (ref 3.5–5.1)

## 2013-01-01 LAB — LIPID PANEL
Cholesterol: 196 mg/dL (ref 0–200)
LDL Cholesterol: 108 mg/dL — ABNORMAL HIGH (ref 0–99)

## 2013-01-01 MED ORDER — ALPRAZOLAM 0.25 MG PO TABS: 0.2500 mg | ORAL_TABLET | Freq: Every evening | ORAL | Status: DC | PRN

## 2013-01-01 NOTE — Progress Notes (Signed)
  Subjective:    Patient ID: Jennifer [name], female    DOB: 1941/02/20, 72 y.o.   MRN: [id number]  HPI Here for follow up - reviewed chronic medical issues:  hypertension - intolerant of beta-blocker due to fatigue and ACEI due to cough; reports compliance with ongoing medical treatment and no changes in medication dose or frequency. denies adverse side effects related to current therapy.  no chest pain, no headache, no edema  dyslipidemia - statin began 07/2009 - reports compliance with ongoing medical treatment - no changes in medication dose or frequency. denies adverse side effects related to current therapy.   smoking- understands need to quit and is working on same. does not feel meds or self-help classes would help, attributes cost of smoking as motivating factor in desire to quit - intermittently using wellbutrin 09/2011 - not interested in Chantix  PAD/PVD - abn ABI 02/2010, improved on f/u ABI 08/2010 - follows with periph cards who initially recommended intervention but pt opted for medical management only - on pletal + ASA; symptoms much improved, less claudication in legs: "only hurts if i walk fast" -pain relieved with cessation of walking/rest  Past Medical History  Diagnosis Date  . SMOKER   . Peripheral vascular disease, unspecified dx 02/2010 ABI  . ANEMIA-NOS   . DYSLIPIDEMIA   . HYPERTENSION   . Hemorrhoids   . Osteoporosis, senile    Review of Systems  Constitutional: Positive for fatigue. Negative for unexpected weight change.  Respiratory: Negative for cough and shortness of breath.   Cardiovascular: Negative for chest pain and leg swelling.  Neurological: Negative for headaches.  Psychiatric/Behavioral: Positive for sleep disturbance. Negative for suicidal ideas and dysphoric mood. The patient is nervous/anxious (occ).        Objective:   Physical ExamBP 128/82  Pulse 82  Temp(Src) 98 F (36.7 C) (Oral)  Wt 99 lb 3.2 oz (44.997 kg)  BMI 17.02 kg/m2  SpO2  96% Wt Readings from Last 3 Encounters:  01/01/13 99 lb 3.2 oz (44.997 kg)  12/02/12 100 lb 1.9 oz (45.414 kg)  07/01/12 95 lb 1.9 oz (43.146 kg)   Constitutional: She is thin, smells of tobacco - but appears well-developed and well-nourished. No distress.  Neck: Normal range of motion. Neck supple. No JVD present. No thyromegaly present.  Cardiovascular: Normal rate, regular rhythm and normal heart sounds.  No murmur heard. No BLE edema. Pulmonary/Chest: Effort normal and breath sounds normal. No respiratory distress. She has no wheezes. Psychiatric: She has a normal mood and affect. Her behavior is normal. Judgment and thought content normal.   Lab Results  Component Value Date   WBC 8.9 05/11/2011   HGB 12.1 05/11/2011   HCT 35.8* 05/11/2011   PLT 357 05/11/2011   GLUCOSE 86 05/11/2011   CHOL 186 01/01/2012   TRIG 58.0 01/01/2012   HDL 85.00 01/01/2012   LDLDIRECT 232.1 08/09/2009   LDLCALC 89 01/01/2012   ALT 20 01/01/2012   AST 27 01/01/2012   NA 136 05/11/2011   K 3.9 05/18/2011   CL 97 05/11/2011   CREATININE 0.67 05/11/2011   BUN 11 05/11/2011   CO2 28 05/11/2011   TSH 1.84 08/15/2010        Assessment & Plan:  See problem list. Medications and labs reviewed today.  Fatigue - nonspecific symptoms/exam - check screening labs

## 2013-01-01 NOTE — Assessment & Plan Note (Signed)
Chronic symptoms, exacerbated by family illness 02/18/2012 and death of her pet 04-19-2012 Started low-dose paroxetine 06/2012 - improved Ok for prn alprazolam for anxiety symptoms - The current medical regimen is effective;  continue present plan and medications.  Verified no SI/HI

## 2013-01-01 NOTE — Assessment & Plan Note (Signed)
On statin since 07/2009 - unable to tolerate higher dose atorva due to myalgias The current medical regimen is effective;  continue present plan and medications. 

## 2013-01-01 NOTE — Telephone Encounter (Signed)
Left message on pt's voicemail of stable results. Left message to call back if questions.

## 2013-01-01 NOTE — Patient Instructions (Signed)
It was good to see you today. We have reviewed your prior records including labs and tests today Your annual flu shot was given and/or updated today. Test(s) ordered today. Your results will be released to MyChart (or called to you) after review, usually within 72hours after test completion. If any changes need to be made, you will be notified at that same time. Medications reviewed and updated, no changes recommended at this time. Please schedule followup in 6 months, call sooner if problems.

## 2013-01-01 NOTE — Telephone Encounter (Signed)
Follow up   Pt returning a call believes it was results from imaging

## 2013-01-01 NOTE — Assessment & Plan Note (Signed)
Home log reviewed - improved - Intolerant of ACEI due to cough, intolerant of lopressor due to fatigue Added HCTZ 07/2010 to other medications - changed to ARB-diuretic combo 02/2011 and titrated to max 10/2011  BP Readings from Last 3 Encounters:  01/01/13 128/82  12/02/12 160/91  07/01/12 148/90

## 2013-01-06 ENCOUNTER — Encounter (INDEPENDENT_AMBULATORY_CARE_PROVIDER_SITE_OTHER): Payer: Medicare Other

## 2013-01-06 DIAGNOSIS — I6529 Occlusion and stenosis of unspecified carotid artery: Secondary | ICD-10-CM

## 2013-02-17 LAB — FECAL OCCULT BLOOD, GUAIAC: Fecal Occult Blood: NEGATIVE

## 2013-02-27 ENCOUNTER — Other Ambulatory Visit: Payer: Self-pay | Admitting: Internal Medicine

## 2013-03-11 ENCOUNTER — Encounter: Payer: Self-pay | Admitting: Internal Medicine

## 2013-03-27 ENCOUNTER — Other Ambulatory Visit: Payer: Self-pay | Admitting: Internal Medicine

## 2013-04-02 ENCOUNTER — Encounter: Payer: Self-pay | Admitting: Internal Medicine

## 2013-05-06 ENCOUNTER — Other Ambulatory Visit: Payer: Self-pay | Admitting: Internal Medicine

## 2013-05-26 ENCOUNTER — Other Ambulatory Visit: Payer: Self-pay | Admitting: Internal Medicine

## 2013-06-16 ENCOUNTER — Telehealth: Payer: Self-pay | Admitting: *Deleted

## 2013-06-16 NOTE — Telephone Encounter (Signed)
Patient phoned stating that over the weekend, (hx of htn), her bp dropped dramatically and she "passed out" for a few seconds.  Attempted to return patient's phone call, no answer, left voicemail message to call back.  CB# [phone]

## 2013-06-16 NOTE — Telephone Encounter (Signed)
Patient stated that after 2 glasses of wine, her syncope episode had a bp of 78/50, but 30 minutes later, 120 SBP; n/v.  States no further sxs since. States she's calling to let PCP know about this episode at PCP's recommendation.  Please advise if further instruction needed for patient.  She has an appt scheduled with PCP 07/02/13.  CB# [phone]

## 2013-06-17 NOTE — Telephone Encounter (Signed)
Event noted - agree with OV as scheduled - and advise no wine at this time

## 2013-06-17 NOTE — Telephone Encounter (Signed)
Phoned patient and relayed MD's recommendation.  She verbalized understanding & appreciation.

## 2013-06-19 ENCOUNTER — Other Ambulatory Visit: Payer: Self-pay | Admitting: Internal Medicine

## 2013-06-30 ENCOUNTER — Other Ambulatory Visit: Payer: Self-pay | Admitting: Cardiovascular Disease

## 2013-07-02 ENCOUNTER — Ambulatory Visit: Payer: Medicare Other | Admitting: Internal Medicine

## 2013-07-09 ENCOUNTER — Ambulatory Visit: Payer: Medicare Other | Admitting: Internal Medicine

## 2013-07-21 ENCOUNTER — Encounter: Payer: Self-pay | Admitting: Internal Medicine

## 2013-07-21 ENCOUNTER — Ambulatory Visit (INDEPENDENT_AMBULATORY_CARE_PROVIDER_SITE_OTHER): Payer: Medicare Other | Admitting: Internal Medicine

## 2013-07-21 VITALS — BP 142/82 | HR 84 | Temp 98.2°F | Wt 93.4 lb

## 2013-07-21 DIAGNOSIS — I10 Essential (primary) hypertension: Secondary | ICD-10-CM

## 2013-07-21 DIAGNOSIS — F411 Generalized anxiety disorder: Secondary | ICD-10-CM

## 2013-07-21 DIAGNOSIS — Z23 Encounter for immunization: Secondary | ICD-10-CM

## 2013-07-21 DIAGNOSIS — I739 Peripheral vascular disease, unspecified: Secondary | ICD-10-CM

## 2013-07-21 DIAGNOSIS — Z1211 Encounter for screening for malignant neoplasm of colon: Secondary | ICD-10-CM

## 2013-07-21 MED ORDER — ALPRAZOLAM 0.25 MG PO TABS: 0.2500 mg | ORAL_TABLET | Freq: Every evening | ORAL | Status: DC | PRN

## 2013-07-21 MED ORDER — PAROXETINE HCL 10 MG PO TABS: 10.0000 mg | ORAL_TABLET | Freq: Every day | ORAL | Status: DC

## 2013-07-21 NOTE — Assessment & Plan Note (Addendum)
Intolerant of ACEI due to cough intolerant of lopressor due to fatigue Added HCTZ 07/2010 to other medications - changed to ARB-diuretic combo 02/2011 and titrated to max 10/2011 Current regimen is effective.  Continue current plan.  BP Readings from Last 3 Encounters:  07/21/13 142/82  01/01/13 128/82  12/02/12 160/91

## 2013-07-21 NOTE — Assessment & Plan Note (Addendum)
Chronic symptoms, exacerbated by family illness 01/2012 and death of her pet 03/2012 - has new dog since late 2014 Started low-dose paroxetine 06/2012 - improved Ok for prn alprazolam for anxiety symptoms - Continue same - refills today The current medical regimen is effective;  continue present plan and medications.  Verified no SI/H 

## 2013-07-21 NOTE — Progress Notes (Signed)
Subjective:    Patient ID: Jennifer BackersSusan L Nessel, female    DOB: 02/09/1941, 73 y.o.   MRN: [id number]  HPI  Patient here today for follow up.  Chronic medical issues reviewed.   hypertension - intolerant of beta-blocker due to fatigue and ACEI due to cough; reports compliance with ongoing medical treatment and no changes in medication dose or frequency. denies adverse side effects related to current therapy. no chest pain, no headache, no edema   dyslipidemia - statin began 07/2009 - reports compliance with ongoing medical treatment - no changes in medication dose or frequency. denies adverse side effects related to current therapy.   smoking- understands need to quit and is working on same. does not feel meds or self-help classes would help, attributes cost of smoking as motivating factor in desire to quit - intermittently using wellbutrin 09/2011 - not interested in Chantix   PAD/PVD - abn ABI 02/2010, improved on f/u ABI 08/2010 - follows with periph cards who initially recommended intervention but pt opted for medical management only - on pletal + ASA; symptoms much improved, less claudication in legs: "only hurts if i walk fast" -pain relieved with cessation of walking/rest  Anxiety - chronic symptoms - on Paroxetine and Xanax prn  Syncope - one episode while at party end of February 2015 - was warm in room, had drank 2 glasses of wine without eating.  Prodrome of flushing, BP 70's immediately following episode with return to normal within 1 hour.  No further episodes or pre-syncope.   Past Medical History  Diagnosis Date  . SMOKER   . Peripheral vascular disease, unspecified dx 02/2010 ABI  . ANEMIA-NOS   . DYSLIPIDEMIA   . HYPERTENSION   . Hemorrhoids   . Osteoporosis, senile      Review of Systems  Constitutional: Negative for fever, chills, activity change and appetite change.  Respiratory: Negative for cough, chest tightness, shortness of breath and wheezing.   Cardiovascular:  Negative for chest pain, palpitations and leg swelling.  Gastrointestinal: Negative for nausea, vomiting, diarrhea and constipation.  Musculoskeletal: Negative for arthralgias and myalgias.  Skin: Negative for rash and wound.  Neurological: Negative for dizziness, weakness, light-headedness and headaches.  Psychiatric/Behavioral: Negative for suicidal ideas and sleep disturbance. The patient is not nervous/anxious.        Objective:   Physical Exam  Vitals reviewed. Constitutional: She is oriented to person, place, and time. She appears well-developed and well-nourished. No distress.  Neck: Normal range of motion. Neck supple. No thyromegaly present.  Cardiovascular: Normal rate, regular rhythm and normal heart sounds.   No murmur heard. Pulmonary/Chest: Effort normal and breath sounds normal. No respiratory distress. She has no wheezes. She has no rales.  Abdominal: Soft. Bowel sounds are normal. She exhibits no distension. There is no tenderness. There is no rebound.  Musculoskeletal: Normal range of motion. She exhibits no edema and no tenderness.  Lymphadenopathy:    She has no cervical adenopathy.  Neurological: She is alert and oriented to person, place, and time.  Skin: Skin is warm and dry. No rash noted. She is not diaphoretic.  Psychiatric: She has a normal mood and affect. Her behavior is normal. Judgment and thought content normal.    Wt Readings from Last 3 Encounters:  07/21/13 93 lb 6.4 oz (42.366 kg)  01/01/13 99 lb 3.2 oz (44.997 kg)  12/02/12 100 lb 1.9 oz (45.414 kg)   BP Readings from Last 3 Encounters:  07/21/13 142/82  01/01/13 128/82  12/02/12 160/91   Lab Results  Component Value Date   WBC 8.4 01/01/2013   HGB 12.5 01/01/2013   HCT 36.4 01/01/2013   PLT 322.0 01/01/2013   GLUCOSE 88 01/01/2013   CHOL 196 01/01/2013   TRIG 59.0 01/01/2013   HDL 76.20 01/01/2013   LDLDIRECT 232.1 08/09/2009   LDLCALC 108* 01/01/2013   ALT 20 01/01/2013   AST 26 01/01/2013     NA 129* 01/01/2013   K 4.0 01/01/2013   CL 92* 01/01/2013   CREATININE 0.6 01/01/2013   BUN 15 01/01/2013   CO2 30 01/01/2013   TSH 1.68 01/01/2013       Assessment & Plan:   Syncope - likely vasovagal.  Patient aware of triggers to avoid, has had no further episodes. Advised to call with recurrence   Problem List Items Addressed This Visit   Anxiety - Primary     Chronic symptoms, exacerbated by family illness 2012/02/25 and death of her pet April 26, 2012 - has new dog since late 2014 Started low-dose paroxetine 06/2012 - improved Ok for prn alprazolam for anxiety symptoms - Continue same - refills today The current medical regimen is effective;  continue present plan and medications.  Verified no SI/H    Relevant Medications      PARoxetine (PAXIL) tablet      ALPRAZolam (XANAX) tablet   HYPERTENSION      Intolerant of ACEI due to cough intolerant of lopressor due to fatigue Added HCTZ 07/2010 to other medications - changed to ARB-diuretic combo 02/2011 and titrated to max 10/2011 Current regimen is effective.  Continue current plan.  BP Readings from Last 3 Encounters:  07/21/13 142/82  01/01/13 128/82  12/02/12 160/91      PAD (peripheral artery disease)     Seen by Clifton James 02/2010 for same -  ABIs reviewed - improved on 08/2010 ABI, - stable 07/2011 and 09/2012 ABI - follow annually and prn per cards Carotid ultrasound with 60-79% ICAS on left on pletal + ASA, less smoking and statin - improved pain symptoms on same and with increased walking Continue to work on risk factor modification and follow up cards as planned     Other Visit Diagnoses   Special screening for malignant neoplasms, colon          Time spent with pt/ today 25 minutes, greater than 50% time spent counseling patient on anxiety/depression, PAD, HTN and medication review. Also review of prior records

## 2013-07-21 NOTE — Progress Notes (Signed)
Pre visit review using our clinic review tool, if applicable. No additional management support is needed unless otherwise documented below in the visit note. 

## 2013-07-21 NOTE — Patient Instructions (Signed)
It was good to see you today.  We have reviewed your prior records including labs and tests today  Updated tetanus and pneumonia vaccines today as discussed  Medications reviewed and updated, no changes recommended at this time. Refill on medication(s) as discussed today.  We'll have you screened for colon cancer with [facility] (stool DNA testing) - this packet will be sent to your home by the testing company. Complete instructions as in the box, and we will contact you with the results once available.  Please schedule followup in 6 months for annual exam and labs; call sooner if problems.

## 2013-07-21 NOTE — Assessment & Plan Note (Addendum)
Seen by mcalhany 02/2010 for same -  ABIs reviewed - improved on 08/2010 ABI, - stable 07/2011 and 09/2012 ABI - follow annually and prn per cards Carotid ultrasound with 60-79% ICAS on left on pletal + ASA, less smoking and statin - improved pain symptoms on same and with increased walking Continue to work on risk factor modification and follow up cards as planned

## 2013-07-22 ENCOUNTER — Telehealth: Payer: Self-pay | Admitting: Internal Medicine

## 2013-07-22 NOTE — Telephone Encounter (Signed)
Relevant patient education assigned to patient using Emmi. ° °

## 2013-07-28 ENCOUNTER — Other Ambulatory Visit (HOSPITAL_COMMUNITY): Payer: Self-pay | Admitting: Cardiology

## 2013-07-28 DIAGNOSIS — I6529 Occlusion and stenosis of unspecified carotid artery: Secondary | ICD-10-CM

## 2013-08-06 ENCOUNTER — Ambulatory Visit (HOSPITAL_COMMUNITY): Payer: Medicare Other | Attending: Internal Medicine | Admitting: Cardiology

## 2013-08-06 DIAGNOSIS — I10 Essential (primary) hypertension: Secondary | ICD-10-CM | POA: Insufficient documentation

## 2013-08-06 DIAGNOSIS — F17200 Nicotine dependence, unspecified, uncomplicated: Secondary | ICD-10-CM | POA: Insufficient documentation

## 2013-08-06 DIAGNOSIS — R0989 Other specified symptoms and signs involving the circulatory and respiratory systems: Secondary | ICD-10-CM | POA: Insufficient documentation

## 2013-08-06 DIAGNOSIS — I6529 Occlusion and stenosis of unspecified carotid artery: Secondary | ICD-10-CM | POA: Insufficient documentation

## 2013-08-06 DIAGNOSIS — E785 Hyperlipidemia, unspecified: Secondary | ICD-10-CM | POA: Insufficient documentation

## 2013-08-06 DIAGNOSIS — I658 Occlusion and stenosis of other precerebral arteries: Secondary | ICD-10-CM | POA: Insufficient documentation

## 2013-08-06 DIAGNOSIS — R55 Syncope and collapse: Secondary | ICD-10-CM

## 2013-08-06 DIAGNOSIS — I739 Peripheral vascular disease, unspecified: Secondary | ICD-10-CM | POA: Insufficient documentation

## 2013-08-06 NOTE — Progress Notes (Signed)
Carotid duplex complete 

## 2013-08-10 LAB — HM COLONOSCOPY: HM COLON: NEGATIVE

## 2013-08-19 ENCOUNTER — Encounter: Payer: Self-pay | Admitting: Internal Medicine

## 2013-08-20 ENCOUNTER — Telehealth: Payer: Self-pay | Admitting: *Deleted

## 2013-08-20 NOTE — Telephone Encounter (Signed)
MD received results back from cologuard. Results was negative. Notified pt with results. Already been abstracted...Raechel Chute/lmb

## 2013-09-26 ENCOUNTER — Other Ambulatory Visit: Payer: Self-pay | Admitting: Internal Medicine

## 2013-10-29 ENCOUNTER — Other Ambulatory Visit: Payer: Self-pay | Admitting: Cardiovascular Disease

## 2013-11-28 ENCOUNTER — Other Ambulatory Visit: Payer: Self-pay | Admitting: Cardiovascular Disease

## 2013-12-11 ENCOUNTER — Ambulatory Visit (INDEPENDENT_AMBULATORY_CARE_PROVIDER_SITE_OTHER): Payer: Medicare Other | Admitting: Cardiovascular Disease

## 2013-12-11 ENCOUNTER — Encounter: Payer: Self-pay | Admitting: Cardiovascular Disease

## 2013-12-11 VITALS — BP 126/78 | HR 76 | Ht 64.0 in | Wt 94.8 lb

## 2013-12-11 DIAGNOSIS — Z720 Tobacco use: Secondary | ICD-10-CM

## 2013-12-11 DIAGNOSIS — I739 Peripheral vascular disease, unspecified: Secondary | ICD-10-CM

## 2013-12-11 DIAGNOSIS — I779 Disorder of arteries and arterioles, unspecified: Secondary | ICD-10-CM

## 2013-12-11 DIAGNOSIS — F17200 Nicotine dependence, unspecified, uncomplicated: Secondary | ICD-10-CM

## 2013-12-11 NOTE — Progress Notes (Signed)
History of Present Illness: 73 yo WF with history of PAD, tobacco abuse, HTN and hyperlipidemia who is here today for PV follow up. I saw her as a new pt in November 2011 for evaluation of PAD. Initial non-invasive studies showed chronic total occlusion of the left SFA with a long segment of total occlusion, reconstitution above the knee. There is moderate disease in the right SFA. Her initial ABI in 2012 was moderately reduced at 0.76 on the right and 0.60 on the left. I recommended that we pursue a diagnostic angiogram if she wished but she refused. Most recent non-invasive studies September 2014 with right ABI 0.88 and left ABI 0.72. Carotid artery dopplers 2/13/084/15/15 60-79% LICA, 0-39% RICA stenosis.   She has been feeling great. No leg pain with walking. No rest pain or ulcerations. No chest pain or SOB. She is still smoking but has cut back to 7 cigarettes  per day.   Primary Care Physician: Felicity CoyerLeschber  Last Lipid Profile:Lipid Panel     Component Value Date/Time   CHOL 196 01/01/2013 1523   TRIG 59.0 01/01/2013 1523   HDL 76.20 01/01/2013 1523   CHOLHDL 3 01/01/2013 1523   VLDL 11.8 01/01/2013 1523   LDLCALC 108* 01/01/2013 1523     Past Medical History  Diagnosis Date  . SMOKER   . Peripheral vascular disease, unspecified dx 02/2010 ABI  . ANEMIA-NOS   . DYSLIPIDEMIA   . HYPERTENSION   . Hemorrhoids   . Osteoporosis, senile     Past Surgical History  Procedure Laterality Date  . Tubal ligation  1979  . Tonsillectomy  1946  . Hemorrhoid surgery  05/18/2011    Procedure: HEMORRHOIDECTOMY;  Surgeon: Robyne AskewPaul S Toth III, MD;  Location: [facility];  Service: General;  Laterality: N/A;  hemorrhoidectomy, three columns, internal and external    Current Outpatient Prescriptions  Medication Sig Dispense Refill  . ALPRAZolam (XANAX) 0.25 MG tablet Take 1 tablet (0.25 mg total) by mouth at bedtime as needed for anxiety.  30 tablet  5  . amLODipine (NORVASC) 5 MG tablet TAKE 1 TABLET BY MOUTH  EVERY NIGHT AT BEDTIME  90 tablet  1  . aspirin 81 MG tablet Take 81 mg by mouth daily before breakfast.       . atorvastatin (LIPITOR) 20 MG tablet TAKE 1 TABLET BY MOUTH EVERY DAY  30 tablet  11  . calcium-vitamin D (OSCAL WITH D) 500-200 MG-UNIT per tablet Take 2 tablets by mouth daily.      . cilostazol (PLETAL) 50 MG tablet TAKE 1 TABLET BY MOUTH TWICE DAILY  60 tablet  0  . losartan-hydrochlorothiazide (HYZAAR) 50-12.5 MG per tablet TAKE 1 TABLET BY MOUTH EVERY DAY  30 tablet  11  . PARoxetine (PAXIL) 10 MG tablet Take 1 tablet (10 mg total) by mouth daily.  90 tablet  3   No current facility-administered medications for this visit.    No Known Allergies  History   Social History  . Marital Status: Single    Spouse Name: N/A    Number of Children: N/A  . Years of Education: N/A   Occupational History  . Not on file.   Social History Main Topics  . Smoking status: Current Some Day Smoker -- 0.50 packs/day for 50 years    Types: Cigarettes  . Smokeless tobacco: Never Used     Comment: Single, lives alone. 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  . Alcohol Use: Yes  Comment: Social  . Drug Use: No  . Sexual Activity: Not on file   Other Topics Concern  . Not on file   Social History Narrative  . No narrative on file    Family History  Problem Relation Age of Onset  . Breast cancer Mother   . Cancer Mother     breast  . Hypertension Brother   . Lung cancer Other   . Hypertension Other   . Cancer Father     lung    Review of Systems:  As stated in the HPI and otherwise negative.   BP 126/78  Pulse 76  Ht 5\' 4"  (1.626 m)  Wt 94 lb 12.8 oz (43.001 kg)  BMI 16.26 kg/m2  Physical Examination: General: Well developed, well nourished, NAD HEENT: OP clear, mucus membranes moist SKIN: warm, dry. No rashes. Neuro: No focal deficits Musculoskeletal: Muscle strength 5/5 all ext Psychiatric: Mood and affect normal Neck: No JVD, no carotid bruits,  no thyromegaly, no lymphadenopathy. Lungs:Clear bilaterally, no wheezes, rhonci, crackles Cardiovascular: Regular rate and rhythm. No murmurs, gallops or rubs. Abdomen:Soft. Bowel sounds present. Non-tender.  Extremities: No lower extremity edema. Pulses are 1+ in the right DP/PT and not palpable DP/PT.  EKG: NSR, rate 76 bpm. LVH. Non-specific ST and T wave abnormalities.   Assessment and Plan:   1. PAD:  Stable. No rest pain or ulcerations. She does get claudication at 300 yards. Will continue ASA and Pletal. Repeat ABI September 2015.   2. Carotid artery disease: 60-79% LICA, 0-39% RICA stenosis by dopplers April 2015. Repeat October 2015.   3. Tobacco abuse: She is trying to stop.

## 2013-12-11 NOTE — Patient Instructions (Signed)
Your physician wants you to follow-up in:  12 months. You will receive a reminder letter in the mail two months in advance. If you don't receive a letter, please call our office to schedule the follow-up appointment.  Your physician has requested that you have an ankle brachial index (ABI). During this test an ultrasound and blood pressure cuff are used to evaluate the arteries that supply the arms and legs with blood. Allow thirty minutes for this exam. There are no restrictions or special instructions. To be done after February 05, 2014   Your physician has requested that you have a carotid duplex. This test is an ultrasound of the carotid arteries in your neck. It looks at blood flow through these arteries that supply the brain with blood. Allow one hour for this exam. There are no restrictions or special instructions. To be done after February 05, 2014

## 2013-12-31 ENCOUNTER — Other Ambulatory Visit: Payer: Self-pay

## 2013-12-31 MED ORDER — CILOSTAZOL 50 MG PO TABS: ORAL_TABLET | ORAL | Status: DC

## 2014-01-13 ENCOUNTER — Ambulatory Visit (INDEPENDENT_AMBULATORY_CARE_PROVIDER_SITE_OTHER): Payer: Medicare Other

## 2014-01-13 DIAGNOSIS — Z23 Encounter for immunization: Secondary | ICD-10-CM

## 2014-01-21 ENCOUNTER — Ambulatory Visit: Payer: Medicare Other | Admitting: Internal Medicine

## 2014-01-29 ENCOUNTER — Telehealth: Payer: Self-pay | Admitting: Internal Medicine

## 2014-01-29 NOTE — Telephone Encounter (Signed)
Patient would like to know the name of an opthamologist,  please advise

## 2014-01-30 ENCOUNTER — Other Ambulatory Visit: Payer: Self-pay | Admitting: Internal Medicine

## 2014-01-30 DIAGNOSIS — Z0100 Encounter for examination of eyes and vision without abnormal findings: Secondary | ICD-10-CM

## 2014-01-30 NOTE — Telephone Encounter (Signed)
[facility] to [facility] refer if needed

## 2014-02-09 ENCOUNTER — Ambulatory Visit (HOSPITAL_COMMUNITY): Payer: Medicare Other | Attending: Cardiology | Admitting: Cardiology

## 2014-02-09 ENCOUNTER — Ambulatory Visit (HOSPITAL_BASED_OUTPATIENT_CLINIC_OR_DEPARTMENT_OTHER): Payer: Medicare Other | Admitting: Cardiology

## 2014-02-09 DIAGNOSIS — I779 Disorder of arteries and arterioles, unspecified: Secondary | ICD-10-CM

## 2014-02-09 DIAGNOSIS — Z720 Tobacco use: Secondary | ICD-10-CM | POA: Diagnosis not present

## 2014-02-09 DIAGNOSIS — I10 Essential (primary) hypertension: Secondary | ICD-10-CM | POA: Insufficient documentation

## 2014-02-09 DIAGNOSIS — I739 Peripheral vascular disease, unspecified: Secondary | ICD-10-CM

## 2014-02-09 DIAGNOSIS — E785 Hyperlipidemia, unspecified: Secondary | ICD-10-CM | POA: Insufficient documentation

## 2014-02-09 DIAGNOSIS — I6523 Occlusion and stenosis of bilateral carotid arteries: Secondary | ICD-10-CM

## 2014-02-09 DIAGNOSIS — R0989 Other specified symptoms and signs involving the circulatory and respiratory systems: Secondary | ICD-10-CM

## 2014-02-09 NOTE — Progress Notes (Signed)
ABI performed  

## 2014-02-09 NOTE — Progress Notes (Signed)
Carotid duplex performed 

## 2014-02-12 ENCOUNTER — Telehealth: Payer: Self-pay | Admitting: Cardiovascular Disease

## 2014-02-12 NOTE — Telephone Encounter (Signed)
Spoke with pt and reviewed ABI and carotid doppler results with her.

## 2014-02-12 NOTE — Telephone Encounter (Signed)
New message ° ° ° ° °Want test results °

## 2014-02-23 ENCOUNTER — Other Ambulatory Visit: Payer: Self-pay | Admitting: Internal Medicine

## 2014-03-12 HISTORY — PX: CATARACT EXTRACTION W/ INTRAOCULAR LENS IMPLANT: SHX1309

## 2014-03-15 ENCOUNTER — Other Ambulatory Visit: Payer: Self-pay | Admitting: Internal Medicine

## 2014-03-23 ENCOUNTER — Other Ambulatory Visit: Payer: Self-pay | Admitting: Internal Medicine

## 2014-03-29 ENCOUNTER — Other Ambulatory Visit: Payer: Self-pay | Admitting: Cardiovascular Disease

## 2014-03-30 ENCOUNTER — Encounter: Payer: Self-pay | Admitting: Internal Medicine

## 2014-03-30 ENCOUNTER — Ambulatory Visit (INDEPENDENT_AMBULATORY_CARE_PROVIDER_SITE_OTHER): Payer: Medicare Other | Admitting: Internal Medicine

## 2014-03-30 ENCOUNTER — Other Ambulatory Visit (INDEPENDENT_AMBULATORY_CARE_PROVIDER_SITE_OTHER): Payer: Medicare Other

## 2014-03-30 VITALS — BP 122/62 | HR 79 | Temp 97.4°F | Ht 64.0 in | Wt 91.8 lb

## 2014-03-30 DIAGNOSIS — M8580 Other specified disorders of bone density and structure, unspecified site: Secondary | ICD-10-CM

## 2014-03-30 DIAGNOSIS — I10 Essential (primary) hypertension: Secondary | ICD-10-CM

## 2014-03-30 DIAGNOSIS — F411 Generalized anxiety disorder: Secondary | ICD-10-CM

## 2014-03-30 DIAGNOSIS — E785 Hyperlipidemia, unspecified: Secondary | ICD-10-CM

## 2014-03-30 DIAGNOSIS — Z23 Encounter for immunization: Secondary | ICD-10-CM

## 2014-03-30 DIAGNOSIS — Z0000 Encounter for general adult medical examination without abnormal findings: Secondary | ICD-10-CM

## 2014-03-30 LAB — URINALYSIS, ROUTINE W REFLEX MICROSCOPIC
BILIRUBIN URINE: NEGATIVE
KETONES UR: NEGATIVE
Nitrite: POSITIVE — AB
PH: 6.5 (ref 5.0–8.0)
Specific Gravity, Urine: 1.01 (ref 1.000–1.030)
Urine Glucose: NEGATIVE
Urobilinogen, UA: 0.2 (ref 0.0–1.0)

## 2014-03-30 LAB — BASIC METABOLIC PANEL
BUN: 20 mg/dL (ref 6–23)
CO2: 28 mEq/L (ref 19–32)
Calcium: 9.6 mg/dL (ref 8.4–10.5)
Chloride: 98 mEq/L (ref 96–112)
Creatinine, Ser: 0.8 mg/dL (ref 0.4–1.2)
GFR: 79.93 mL/min (ref >60.00)
GLUCOSE: 104 mg/dL — AB (ref 70–99)
Potassium: 4.1 mEq/L (ref 3.5–5.1)
Sodium: 136 mEq/L (ref 135–145)

## 2014-03-30 LAB — LIPID PANEL
CHOLESTEROL: 195 mg/dL (ref 0–200)
HDL: 83.7 mg/dL (ref >39.00)
LDL CALC: 97 mg/dL (ref 0–99)
NonHDL: 111.3
Total CHOL/HDL Ratio: 2
Triglycerides: 71 mg/dL (ref 0.0–149.0)
VLDL: 14.2 mg/dL (ref 0.0–40.0)

## 2014-03-30 LAB — CBC WITH DIFFERENTIAL/PLATELET
Basophils Absolute: 0.1 10*3/uL (ref 0.0–0.1)
Basophils Relative: 0.6 % (ref 0.0–3.0)
Eosinophils Absolute: 0.1 10*3/uL (ref 0.0–0.7)
Eosinophils Relative: 0.7 % (ref 0.0–5.0)
HCT: 41.1 % (ref 36.0–46.0)
Hemoglobin: 13.8 g/dL (ref 12.0–15.0)
LYMPHS PCT: 20.1 % (ref 12.0–46.0)
Lymphs Abs: 2.1 10*3/uL (ref 0.7–4.0)
MCHC: 33.6 g/dL (ref 30.0–36.0)
MCV: 90.3 fl (ref 78.0–100.0)
MONOS PCT: 7.8 % (ref 3.0–12.0)
Monocytes Absolute: 0.8 10*3/uL (ref 0.1–1.0)
NEUTROS PCT: 70.8 % (ref 43.0–77.0)
Neutro Abs: 7.5 10*3/uL (ref 1.4–7.7)
PLATELETS: 340 10*3/uL (ref 150.0–400.0)
RBC: 4.55 Mil/uL (ref 3.87–5.11)
RDW: 14.3 % (ref 11.5–15.5)
WBC: 10.6 10*3/uL — ABNORMAL HIGH (ref 4.0–10.5)

## 2014-03-30 LAB — TSH: TSH: 3.02 u[IU]/mL (ref 0.35–4.50)

## 2014-03-30 LAB — HEPATIC FUNCTION PANEL
ALK PHOS: 50 U/L (ref 39–117)
ALT: 22 U/L (ref 0–35)
AST: 31 U/L (ref 0–37)
Albumin: 4.3 g/dL (ref 3.5–5.2)
BILIRUBIN TOTAL: 0.6 mg/dL (ref 0.2–1.2)
Bilirubin, Direct: 0.1 mg/dL (ref 0.0–0.3)
Total Protein: 8.4 g/dL — ABNORMAL HIGH (ref 6.0–8.3)

## 2014-03-30 MED ORDER — CILOSTAZOL 50 MG PO TABS: 50.0000 mg | ORAL_TABLET | Freq: Two times a day (BID) | ORAL | Status: DC

## 2014-03-30 MED ORDER — ATORVASTATIN CALCIUM 20 MG PO TABS: 20.0000 mg | ORAL_TABLET | Freq: Every day | ORAL | Status: DC

## 2014-03-30 MED ORDER — AMLODIPINE BESYLATE 5 MG PO TABS: 5.0000 mg | ORAL_TABLET | Freq: Every day | ORAL | Status: DC

## 2014-03-30 MED ORDER — LOSARTAN POTASSIUM-HCTZ 50-12.5 MG PO TABS: 1.0000 | ORAL_TABLET | Freq: Every day | ORAL | Status: DC

## 2014-03-30 NOTE — Patient Instructions (Addendum)
It was good to see you today.  We have reviewed your prior records including labs and tests today  Health Maintenance reviewed - Prevnar 13 updated today and bone density scan scheduled. All other recommended immunizations and age-appropriate screenings are up-to-date.  Test(s) ordered today. Your results will be released to [place] (or called to you) after review, usually within 72hours after test completion. If any changes need to be made, you will be notified at that same time.  Medications reviewed and updated, no changes recommended at this time. Refill on medication(s) as discussed today.  Please schedule followup in 6 months for annual exam and labs, call sooner if problems.  Continue to think about giving up cigarettes! Use nicotine gum, nicotine patches or electronic cigarettes. If you're interested in medication to help you quit, please call  - let me know how I can help!  Health Maintenance Adopting a healthy lifestyle and getting preventive care can go a long way to promote health and wellness. Talk with your health care provider about what schedule of regular examinations is right for you. This is a good chance for you to check in with your provider about disease prevention and staying healthy. In between checkups, there are plenty of things you can do on your own. Experts have done a lot of research about which lifestyle changes and preventive measures are most likely to keep you healthy. Ask your health care provider for more information. WEIGHT AND DIET  Eat a healthy diet  Be sure to include plenty of vegetables, fruits, low-fat dairy products, and lean protein.  Do not eat a lot of foods high in solid fats, added sugars, or salt.  Get regular exercise. This is one of the most important things you can do for your health.  Most adults should exercise for at least 150 minutes each week. The exercise should increase your heart rate and make you sweat (moderate-intensity  exercise).  Most adults should also do strengthening exercises at least twice a week. This is in addition to the moderate-intensity exercise.  Maintain a healthy weight  Body mass index (BMI) is a measurement that can be used to identify possible weight problems. It estimates body fat based on height and weight. Your health care provider can help determine your BMI and help you achieve or maintain a healthy weight.  For females 23 years of age and older:   A BMI below 18.5 is considered underweight.  A BMI of 18.5 to 24.9 is normal.  A BMI of 25 to 29.9 is considered overweight.  A BMI of 30 and above is considered obese.  Watch levels of cholesterol and blood lipids  You should start having your blood tested for lipids and cholesterol at 75 years of age, then have this test every 5 years.  You may need to have your cholesterol levels checked more often if:  Your lipid or cholesterol levels are high.  You are older than 75 years of age.  You are at high risk for heart disease.  CANCER SCREENING   Lung Cancer  Lung cancer screening is recommended for adults 33-34 years old who are at high risk for lung cancer because of a history of smoking.  A yearly low-dose CT scan of the lungs is recommended for people who:  Currently smoke.  Have quit within the past 15 years.  Have at least a 30-pack-year history of smoking. A pack year is smoking an average of one pack of cigarettes a day for  1 year.  Yearly screening should continue until it has been 15 years since you quit.  Yearly screening should stop if you develop a health problem that would prevent you from having lung cancer treatment.  Breast Cancer  Practice breast self-awareness. This means understanding how your breasts normally appear and feel.  It also means doing regular breast self-exams. Let your health care provider know about any changes, no matter how small.  If you are in your 20s or 30s, you should  have a clinical breast exam (CBE) by a health care provider every 1-3 years as part of a regular health exam.  If you are 46 or older, have a CBE every year. Also consider having a breast X-ray (mammogram) every year.  If you have a family history of breast cancer, talk to your health care provider about genetic screening.  If you are at high risk for breast cancer, talk to your health care provider about having an MRI and a mammogram every year.  Breast cancer gene (BRCA) assessment is recommended for women who have family members with BRCA-related cancers. BRCA-related cancers include:  Breast.  Ovarian.  Tubal.  Peritoneal cancers.  Results of the assessment will determine the need for genetic counseling and BRCA1 and BRCA2 testing. Cervical Cancer Routine pelvic examinations to screen for cervical cancer are no longer recommended for nonpregnant women who are considered low risk for cancer of the pelvic organs (ovaries, uterus, and vagina) and who do not have symptoms. A pelvic examination may be necessary if you have symptoms including those associated with pelvic infections. Ask your health care provider if a screening pelvic exam is right for you.   The Pap test is the screening test for cervical cancer for women who are considered at risk.  If you had a hysterectomy for a problem that was not cancer or a condition that could lead to cancer, then you no longer need Pap tests.  If you are older than 65 years, and you have had normal Pap tests for the past 10 years, you no longer need to have Pap tests.  If you have had past treatment for cervical cancer or a condition that could lead to cancer, you need Pap tests and screening for cancer for at least 20 years after your treatment.  If you no longer get a Pap test, assess your risk factors if they change (such as having a new sexual partner). This can affect whether you should start being screened again.  Some women have medical  problems that increase their chance of getting cervical cancer. If this is the case for you, your health care provider may recommend more frequent screening and Pap tests.  The human papillomavirus (HPV) test is another test that may be used for cervical cancer screening. The HPV test looks for the virus that can cause cell changes in the cervix. The cells collected during the Pap test can be tested for HPV.  The HPV test can be used to screen women 40 years of age and older. Getting tested for HPV can extend the interval between normal Pap tests from three to five years.  An HPV test also should be used to screen women of any age who have unclear Pap test results.  After 75 years of age, women should have HPV testing as often as Pap tests.  Colorectal Cancer  This type of cancer can be detected and often prevented.  Routine colorectal cancer screening usually begins at 75 years of  age and continues through 75 years of age.  Your health care provider may recommend screening at an earlier age if you have risk factors for colon cancer.  Your health care provider may also recommend using home test kits to check for hidden blood in the stool.  A small camera at the end of a tube can be used to examine your colon directly (sigmoidoscopy or colonoscopy). This is done to check for the earliest forms of colorectal cancer.  Routine screening usually begins at age 6.  Direct examination of the colon should be repeated every 5-10 years through 75 years of age. However, you may need to be screened more often if early forms of precancerous polyps or small growths are found. Skin Cancer  Check your skin from head to toe regularly.  Tell your health care provider about any new moles or changes in moles, especially if there is a change in a mole's shape or color.  Also tell your health care provider if you have a mole that is larger than the size of a pencil eraser.  Always use sunscreen. Apply  sunscreen liberally and repeatedly throughout the day.  Protect yourself by wearing long sleeves, pants, a wide-brimmed hat, and sunglasses whenever you are outside. HEART DISEASE, DIABETES, AND HIGH BLOOD PRESSURE   Have your blood pressure checked at least every 1-2 years. High blood pressure causes heart disease and increases the risk of stroke.  If you are between 71 years and 68 years old, ask your health care provider if you should take aspirin to prevent strokes.  Have regular diabetes screenings. This involves taking a blood sample to check your fasting blood sugar level.  If you are at a normal weight and have a low risk for diabetes, have this test once every three years after 75 years of age.  If you are overweight and have a high risk for diabetes, consider being tested at a younger age or more often. PREVENTING INFECTION  Hepatitis B  If you have a higher risk for hepatitis B, you should be screened for this virus. You are considered at high risk for hepatitis B if:  You were born in a country where hepatitis B is common. Ask your health care provider which countries are considered high risk.  Your parents were born in a high-risk country, and you have not been immunized against hepatitis B (hepatitis B vaccine).  You have HIV or AIDS.  You use needles to inject street drugs.  You live with someone who has hepatitis B.  You have had sex with someone who has hepatitis B.  You get hemodialysis treatment.  You take certain medicines for conditions, including cancer, organ transplantation, and autoimmune conditions. Hepatitis C  Blood testing is recommended for:  Everyone born from [birth year] through 1967.  Anyone with known risk factors for hepatitis C. Sexually transmitted infections (STIs)  You should be screened for sexually transmitted infections (STIs) including gonorrhea and chlamydia if:  You are sexually active and are younger than 75 years of age.  You are  older than 75 years of age and your health care provider tells you that you are at risk for this type of infection.  Your sexual activity has changed since you were last screened and you are at an increased risk for chlamydia or gonorrhea. Ask your health care provider if you are at risk.  If you do not have HIV, but are at risk, it may be recommended that you take a  prescription medicine daily to prevent HIV infection. This is called pre-exposure prophylaxis (PrEP). You are considered at risk if:  You are sexually active and do not regularly use condoms or know the HIV status of your partner(s).  You take drugs by injection.  You are sexually active with a partner who has HIV. Talk with your health care provider about whether you are at high risk of being infected with HIV. If you choose to begin PrEP, you should first be tested for HIV. You should then be tested every 3 months for as long as you are taking PrEP.  PREGNANCY   If you are premenopausal and you may become pregnant, ask your health care provider about preconception counseling.  If you may become pregnant, take 400 to 800 micrograms (mcg) of folic acid every day.  If you want to prevent pregnancy, talk to your health care provider about birth control (contraception). OSTEOPOROSIS AND MENOPAUSE   Osteoporosis is a disease in which the bones lose minerals and strength with aging. This can result in serious bone fractures. Your risk for osteoporosis can be identified using a bone density scan.  If you are 42 years of age or older, or if you are at risk for osteoporosis and fractures, ask your health care provider if you should be screened.  Ask your health care provider whether you should take a calcium or vitamin D supplement to lower your risk for osteoporosis.  Menopause may have certain physical symptoms and risks.  Hormone replacement therapy may reduce some of these symptoms and risks. Talk to your health care provider  about whether hormone replacement therapy is right for you.  HOME CARE INSTRUCTIONS   Schedule regular health, dental, and eye exams.  Stay current with your immunizations.   Do not use any tobacco products including cigarettes, chewing tobacco, or electronic cigarettes.  If you are pregnant, do not drink alcohol.  If you are breastfeeding, limit how much and how often you drink alcohol.  Limit alcohol intake to no more than 1 drink per day for nonpregnant women. One drink equals 12 ounces of beer, 5 ounces of wine, or 1 ounces of hard liquor.  Do not use street drugs.  Do not share needles.  Ask your health care provider for help if you need support or information about quitting drugs.  Tell your health care provider if you often feel depressed.  Tell your health care provider if you have ever been abused or do not feel safe at home. Document Released: 10/24/2010 Document Revised: 08/25/2013 Document Reviewed: 03/12/2013 [facility] Patient Information 2015 [place], [place]. This information is not intended to replace advice given to you by your health care provider. Make sure you discuss any questions you have with your health care provider.

## 2014-03-30 NOTE — Assessment & Plan Note (Signed)
On statin since 07/2009 - unable to tolerate higher dose atorva due to myalgias The current medical regimen is effective;  continue present plan and medications.

## 2014-03-30 NOTE — Assessment & Plan Note (Signed)
Chronic symptoms, exacerbated by family illness 01/2012 and death of her pet 03/2012 - has new dog since late 2014 Started low-dose paroxetine 06/2012 - improved Ok for prn alprazolam for anxiety symptoms - Continue same - refills today The current medical regimen is effective;  continue present plan and medications.  Verified no SI/H

## 2014-03-30 NOTE — Progress Notes (Signed)
Pre visit review using our clinic review tool, if applicable. No additional management support is needed unless otherwise documented below in the visit note. 

## 2014-03-30 NOTE — Progress Notes (Signed)
Subjective:    Patient ID: Jennifer BackersSusan L Lisanti, female    DOB: 23-Mar-1941, 73 y.o.   MRN: [id number]  HPI   Here for medicare AWV (wellness)  Diet: heart healthy Physical activity: sedentary Depression/mood screen: negative Hearing: intact to whispered voice Visual acuity: grossly normal, performs annual eye exam  ADLs: capable Fall risk: none Home safety: good Cognitive evaluation: intact to orientation, naming, recall and repetition EOL planning: adv directives, full code/ I agree  I have personally reviewed and have noted 1. The patient's medical and social history 2. Their use of alcohol, tobacco or illicit drugs 3. Their current medications and supplements 4. The patient's functional ability including ADL's, fall risks, home safety risks and hearing or visual impairment. 5. Diet and physical activities 6. Evidence for depression or mood disorders  Also reviewed chronic medical issues and interval medical events  Past Medical History  Diagnosis Date  . SMOKER   . Peripheral vascular disease, unspecified dx 02/2010 ABI  . ANEMIA-NOS   . DYSLIPIDEMIA   . HYPERTENSION   . Hemorrhoids   . Osteoporosis, senile    Family History  Problem Relation Age of Onset  . Breast cancer Mother   . Cancer Mother     breast  . Hypertension Brother   . Lung cancer Other   . Hypertension Other   . Cancer Father     lung   History  Substance Use Topics  . Smoking status: Current Some Day Smoker -- 0.50 packs/day for 50 years    Types: Cigarettes  . Smokeless tobacco: Never Used  . Alcohol Use: 0.0 oz/week    0 Not specified per week     Comment: Social    Review of Systems  Constitutional: Negative for fatigue and unexpected weight change.  Respiratory: Negative for cough, shortness of breath and wheezing.   Cardiovascular: Negative for chest pain, palpitations and leg swelling.  Gastrointestinal: Negative for nausea, abdominal pain and diarrhea.  Neurological: Negative  for dizziness, weakness, light-headedness and headaches.  Psychiatric/Behavioral: Negative for dysphoric mood. The patient is not nervous/anxious.   All other systems reviewed and are negative.      Objective:   Physical Exam  BP 122/62 mmHg  Pulse 79  Temp(Src) 97.4 F (36.3 C) (Oral)  Ht 5\' 4"  (1.626 m)  Wt 91 lb 12 oz (41.618 kg)  BMI 15.74 kg/m2  SpO2 98% Wt Readings from Last 3 Encounters:  03/30/14 91 lb 12 oz (41.618 kg)  12/11/13 94 lb 12.8 oz (43.001 kg)  07/21/13 93 lb 6.4 oz (42.366 kg)   Constitutional: She is kyphotic, thin, and appears well-developed and well-nourished. No distress.  Neck: Normal range of motion. Neck supple. No JVD present. No thyromegaly present.  Cardiovascular: Normal rate, regular rhythm and normal heart sounds.  No murmur heard. No BLE edema. Pulmonary/Chest: Effort normal and breath sounds normal. No respiratory distress. She has no wheezes.  Psychiatric: She has a normal mood and affect. Her behavior is normal. Judgment and thought content normal.   Lab Results  Component Value Date   WBC 8.4 01/01/2013   HGB 12.5 01/01/2013   HCT 36.4 01/01/2013   PLT 322.0 01/01/2013   GLUCOSE 88 01/01/2013   CHOL 196 01/01/2013   TRIG 59.0 01/01/2013   HDL 76.20 01/01/2013   LDLDIRECT 232.1 08/09/2009   LDLCALC 108* 01/01/2013   ALT 20 01/01/2013   AST 26 01/01/2013   NA 129* 01/01/2013   K 4.0 01/01/2013   CL  92* 01/01/2013   CREATININE 0.6 01/01/2013   BUN 15 01/01/2013   CO2 30 01/01/2013   TSH 1.68 01/01/2013    Dg Chest 2 View  05/11/2011   *RADIOLOGY REPORT*  Clinical Data: Hypertension.  Preoperative films.  CHEST - 2 VIEW  Comparison: None.  Findings: The chest is markedly hyperexpanded.  Lungs are clear. No pneumothorax or effusion.  Severe convex left lumbar scoliosis is noted.  IMPRESSION:  1.  Marked pulmonary hyperexpansion consistent with emphysema.  No acute finding. 2.  Severe convex left lumbar scoliosis.  Original Report  Authenticated By: Bernadene BellHOMAS L. Maricela Curet'ALESSIO, M.D.      Assessment & Plan:   AWV/z00.00 - Today patient counseled on age appropriate routine health concerns for screening and prevention, each reviewed and up to date or declined. Immunizations reviewed and up to date or declined. Labs ordered and reviewed. Risk factors for depression reviewed and negative. Hearing function and visual acuity are intact. ADLs screened and addressed as needed. Functional ability and level of safety reviewed and appropriate. Education, counseling and referrals performed based on assessed risks today. Patient provided with a copy of personalized plan for preventive services.  Problem List Items Addressed This Visit    Anxiety state    Chronic symptoms, exacerbated by family illness 01/2012 and death of her pet 03/2012 - has new dog since late 2014 Started low-dose paroxetine 06/2012 - improved Ok for prn alprazolam for anxiety symptoms - Continue same - refills today The current medical regimen is effective;  continue present plan and medications.  Verified no SI/H    Relevant Orders      CBC with Differential      Hepatic function panel      TSH   Dyslipidemia    On statin since 07/2009 - unable to tolerate higher dose atorva due to myalgias The current medical regimen is effective;  continue present plan and medications.    Relevant Medications      atorvastatin (LIPITOR) tablet   Other Relevant Orders      Lipid panel   Essential hypertension    Intolerant of ACEI due to cough intolerant of lopressor due to fatigue Added HCTZ 07/2010 to other medications - changed to ARB-diuretic combo 02/2011 and titrated to max 10/2011 Current regimen is effective.  Continue current plan.  BP Readings from Last 3 Encounters:  03/30/14 122/62  12/11/13 126/78  07/21/13 142/82      Relevant Medications      amLODIpine (NORVASC) tablet      atorvastatin (LIPITOR) tablet      losartan-hydrochlorothiazide (HYZAAR) 50-12.5  MG per tablet   Other Relevant Orders      Basic metabolic panel      Hepatic function panel      Lipid panel      Urinalysis, Routine w reflex microscopic    Other Visit Diagnoses    Routine general medical examination at a health care facility    -  Primary    Osteopenia        Relevant Orders       DG Bone Density       CBC with Differential       Hepatic function panel

## 2014-03-30 NOTE — Assessment & Plan Note (Signed)
Intolerant of ACEI due to cough intolerant of lopressor due to fatigue Added HCTZ 07/2010 to other medications - changed to ARB-diuretic combo 02/2011 and titrated to max 10/2011 Current regimen is effective.  Continue current plan.  BP Readings from Last 3 Encounters:  03/30/14 122/62  12/11/13 126/78  07/21/13 142/82

## 2014-03-31 ENCOUNTER — Telehealth: Payer: Self-pay

## 2014-03-31 MED ORDER — CIPROFLOXACIN HCL 250 MG PO TABS: 250.0000 mg | ORAL_TABLET | Freq: Two times a day (BID) | ORAL | Status: DC

## 2014-03-31 NOTE — Telephone Encounter (Signed)
Pt informed of results and erx for abx sent to pharmacy.

## 2014-04-06 ENCOUNTER — Ambulatory Visit (INDEPENDENT_AMBULATORY_CARE_PROVIDER_SITE_OTHER)
Admission: RE | Admit: 2014-04-06 | Discharge: 2014-04-06 | Disposition: A | Discharge status: Home / Self Care | Payer: Medicare Other | Source: Ambulatory Visit | Attending: Internal Medicine | Admitting: Internal Medicine

## 2014-04-06 DIAGNOSIS — M810 Age-related osteoporosis without current pathological fracture: Secondary | ICD-10-CM

## 2014-04-06 DIAGNOSIS — M8580 Other specified disorders of bone density and structure, unspecified site: Secondary | ICD-10-CM

## 2014-08-31 ENCOUNTER — Other Ambulatory Visit: Payer: Self-pay | Admitting: Cardiovascular Disease

## 2014-08-31 DIAGNOSIS — I6523 Occlusion and stenosis of bilateral carotid arteries: Secondary | ICD-10-CM

## 2014-09-03 ENCOUNTER — Other Ambulatory Visit: Payer: Self-pay | Admitting: Internal Medicine

## 2014-09-07 ENCOUNTER — Ambulatory Visit (HOSPITAL_COMMUNITY): Payer: Medicare Other | Attending: Cardiovascular Disease

## 2014-09-07 DIAGNOSIS — R0989 Other specified symptoms and signs involving the circulatory and respiratory systems: Secondary | ICD-10-CM | POA: Insufficient documentation

## 2014-09-07 DIAGNOSIS — I10 Essential (primary) hypertension: Secondary | ICD-10-CM | POA: Insufficient documentation

## 2014-09-07 DIAGNOSIS — I739 Peripheral vascular disease, unspecified: Secondary | ICD-10-CM | POA: Diagnosis not present

## 2014-09-07 DIAGNOSIS — E785 Hyperlipidemia, unspecified: Secondary | ICD-10-CM | POA: Diagnosis not present

## 2014-09-07 DIAGNOSIS — I6523 Occlusion and stenosis of bilateral carotid arteries: Secondary | ICD-10-CM | POA: Diagnosis not present

## 2014-09-07 DIAGNOSIS — Z720 Tobacco use: Secondary | ICD-10-CM | POA: Insufficient documentation

## 2014-09-21 ENCOUNTER — Other Ambulatory Visit: Payer: Self-pay | Admitting: Internal Medicine

## 2014-10-12 ENCOUNTER — Other Ambulatory Visit: Payer: Self-pay | Admitting: Internal Medicine

## 2014-10-19 ENCOUNTER — Ambulatory Visit (INDEPENDENT_AMBULATORY_CARE_PROVIDER_SITE_OTHER): Payer: Medicare Other | Admitting: Internal Medicine

## 2014-10-19 ENCOUNTER — Encounter: Payer: Self-pay | Admitting: Internal Medicine

## 2014-10-19 VITALS — BP 120/70 | HR 76 | Temp 97.4°F | Ht 64.0 in | Wt 92.5 lb

## 2014-10-19 DIAGNOSIS — R0989 Other specified symptoms and signs involving the circulatory and respiratory systems: Secondary | ICD-10-CM

## 2014-10-19 DIAGNOSIS — M810 Age-related osteoporosis without current pathological fracture: Secondary | ICD-10-CM | POA: Diagnosis not present

## 2014-10-19 DIAGNOSIS — I10 Essential (primary) hypertension: Secondary | ICD-10-CM | POA: Diagnosis not present

## 2014-10-19 MED ORDER — CILOSTAZOL 50 MG PO TABS: 50.0000 mg | ORAL_TABLET | Freq: Two times a day (BID) | ORAL | Status: DC

## 2014-10-19 MED ORDER — LOSARTAN POTASSIUM-HCTZ 50-12.5 MG PO TABS: 1.0000 | ORAL_TABLET | Freq: Every day | ORAL | Status: DC

## 2014-10-19 MED ORDER — PAROXETINE HCL 10 MG PO TABS: 10.0000 mg | ORAL_TABLET | Freq: Every day | ORAL | Status: DC

## 2014-10-19 MED ORDER — ATORVASTATIN CALCIUM 20 MG PO TABS: 20.0000 mg | ORAL_TABLET | Freq: Every day | ORAL | Status: DC

## 2014-10-19 MED ORDER — AMLODIPINE BESYLATE 5 MG PO TABS: 5.0000 mg | ORAL_TABLET | Freq: Every day | ORAL | Status: DC

## 2014-10-19 NOTE — Progress Notes (Signed)
Pre visit review using our clinic review tool, if applicable. No additional management support is needed unless otherwise documented below in the visit note. 

## 2014-10-19 NOTE — Assessment & Plan Note (Signed)
Seen by mcalhany 02/2010 for same -  ABIs reviewed - improved on 08/2010 ABI, - stable 07/2011, 09/2012 09/2013 and 09/2014 ABI - follow annually and prn per cards Carotid ultrasound with 60-79% ICAS on left - stable 09/2014 on pletal + ASA, less smoking and statin - improved pain symptoms on same and with increased walking Continue to work on risk factor modification and follow up cards as planned

## 2014-10-19 NOTE — Assessment & Plan Note (Signed)
Dx 04/2011 - trial fosamax caused nausea so pt self d/c'd same Progression on 03/2014 DEXA, but declines tx other than Ca+D Will consider alternative therapy such as Prolia - education on same provided today Again encouraged tobacco cessation and WB execises

## 2014-10-19 NOTE — Patient Instructions (Signed)
It was good to see you today.  We have reviewed your prior records including labs and tests today  Medications reviewed and updated, no changes recommended at this time. Refill on medication(s) as discussed today.  Please schedule followup in 6 months for annual exam and labs, call sooner if problems.

## 2014-10-19 NOTE — Assessment & Plan Note (Signed)
Intolerant of ACEI due to cough intolerant of lopressor due to fatigue Added HCTZ 07/2010 to other medications - changed to ARB-diuretic combo 02/2011 and titrated to max 10/2011 Current regimen is effective.  Continue current plan.  BP Readings from Last 3 Encounters:  10/19/14 120/70  03/30/14 122/62  12/11/13 126/78

## 2014-10-19 NOTE — Progress Notes (Signed)
Subjective:    Patient ID: Jennifer BackersSusan L [name], female    DOB: 1940-11-16, 74 y.o.   MRN: [id number]  HPI  Patient here for follow up - reviewed chronic issues and interval events  Past Medical History  Diagnosis Date  . SMOKER   . Peripheral vascular disease, unspecified dx 02/2010 ABI  . ANEMIA-NOS   . DYSLIPIDEMIA   . HYPERTENSION   . Hemorrhoids   . Osteoporosis, senile     DEXA 03/2014: -3.3 R fem    Review of Systems  Constitutional: Negative for fatigue and unexpected weight change.  Respiratory: Negative for cough and shortness of breath.   Cardiovascular: Negative for chest pain and leg swelling.  Musculoskeletal: Negative for back pain, arthralgias and gait problem.       Objective:    Physical Exam  Constitutional: She appears well-developed and well-nourished. No distress.  Cardiovascular: Normal rate, regular rhythm and normal heart sounds.   No murmur heard. Pulmonary/Chest: Effort normal and breath sounds normal. No respiratory distress.  Musculoskeletal: She exhibits no edema.    BP 120/70 mmHg  Pulse 76  Temp(Src) 97.4 F (36.3 C) (Oral)  Ht 5\' 4"  (1.626 m)  Wt 92 lb 8 oz (41.958 kg)  BMI 15.87 kg/m2  SpO2 97% Wt Readings from Last 3 Encounters:  10/19/14 92 lb 8 oz (41.958 kg)  03/30/14 91 lb 12 oz (41.618 kg)  12/11/13 94 lb 12.8 oz (43.001 kg)     Lab Results  Component Value Date   WBC 10.6* 03/30/2014   HGB 13.8 03/30/2014   HCT 41.1 03/30/2014   PLT 340.0 03/30/2014   GLUCOSE 104* 03/30/2014   CHOL 195 03/30/2014   TRIG 71.0 03/30/2014   HDL 83.70 03/30/2014   LDLDIRECT 232.1 08/09/2009   LDLCALC 97 03/30/2014   ALT 22 03/30/2014   AST 31 03/30/2014   NA 136 03/30/2014   K 4.1 03/30/2014   CL 98 03/30/2014   CREATININE 0.8 03/30/2014   BUN 20 03/30/2014   CO2 28 03/30/2014   TSH 3.02 03/30/2014    Dg Bone Density  04/09/2014   Date of study: 04/06/2014 Exam: DUAL X-RAY ABSORPTIOMETRY (DXA) FOR BONE MINERAL DENSITY  (BMD) Instrument: [employer] Requesting Provider: Dr. Felicity CoyerLeschber Indication: f/u for osteoporosis Comparison: 05/09/2011 Clinical data: Pt is a 74 y.o. female without h/o fractures. On vitamin D  and calcium  Results:  Lumbar spine L1-L4 (L3) Femoral neck (FN)  T-score - 0.5 RFN: - 3.3 LFN: - 3.1  Change in BMD from previous DXA test (*) + 3.1%* - 6.6%*  (*) statistically significant  Assessment: Patient has OSTEOPOROSIS according to the [facility] classification  for osteoporosis (see below). Fracture risk: high Comments: the technical quality of the study is good, however, the spine  is scoliotic and arthritic. Calcium accumulation in arthritic sites can  confound the results of the bone density scan. L3 had to be excluded from  analysis. Evaluation for secondary causes should be considered if clinically  indicated.  Recommend optimizing calcium (1200 mg/day) and vitamin D (800 IU/day).  Treatment is indicated. Followup: Repeat BMD is appropriate after 2 years or after 1-2 years if  starting treatment.  WHO criteria for diagnosis of osteoporosis in postmenopausal women and in  men 76 y/o or older:  - normal: T-score -1.0 to + 1.0 - osteopenia/low bone density: T-score between -2.5 and -1.0 - osteoporosis: T-score below -2.5 - severe osteoporosis: T-score below -2.5 with history of fragility  fracture Note:  although not part of the WHO classification, the presence of a  fragility fracture, regardless of the T-score, should be considered  diagnostic of osteoporosis, provided other causes for the fracture have  been excluded.  Treatment: The National Osteoporosis Foundation recommends that treatment  be considered in postmenopausal women and men age 55 or older with: 1. Hip or vertebral (clinical or morphometric) fracture 2. T-score of - 2.5 or lower at the spine or hip 3. 10-year fracture probability by FRAX of at least 20% for a major  osteoporotic fracture and 3% for a hip fracture  Carlus Pavlov, MD [place]  Endocrinology       Assessment & Plan:   Problem List Items Addressed This Visit    Carotid bruit    Seen by [facility] 02/2010 for same -  ABIs reviewed - improved on 08/2010 ABI, - stable 07/2011, 09/2012 09/2013 and 09/2014 ABI - follow annually and prn per cards Carotid ultrasound with 60-79% ICAS on left - stable 09/2014 on pletal + ASA, less smoking and statin - improved pain symptoms on same and with increased walking Continue to work on risk factor modification and follow up cards as planned      Essential hypertension    Intolerant of ACEI due to cough intolerant of lopressor due to fatigue Added HCTZ 07/2010 to other medications - changed to ARB-diuretic combo 02/2011 and titrated to max 10/2011 Current regimen is effective.  Continue current plan.  BP Readings from Last 3 Encounters:  10/19/14 120/70  03/30/14 122/62  12/11/13 126/78        Relevant Medications   amLODipine (NORVASC) 5 MG tablet   atorvastatin (LIPITOR) 20 MG tablet   losartan-hydrochlorothiazide (HYZAAR) 50-12.5 MG per tablet   Osteoporosis, senile - Primary    Dx 04/2011 - trial fosamax caused nausea so pt self d/c'd same Progression on 03/2014 DEXA, but declines tx other than Ca+D Will consider alternative therapy such as Prolia - education on same provided today Again encouraged tobacco cessation and WB execises        Time spent with pt today 25 minutes, greater than 50% time spent counseling patient on PAD, hypertension, osteoporosis and medication review. Also review of prior records and educated on need for smoking cessation   Rene Paci, MD

## 2014-12-14 ENCOUNTER — Ambulatory Visit (INDEPENDENT_AMBULATORY_CARE_PROVIDER_SITE_OTHER): Payer: Medicare Other | Admitting: Cardiovascular Disease

## 2014-12-14 ENCOUNTER — Encounter: Payer: Self-pay | Admitting: Cardiovascular Disease

## 2014-12-14 VITALS — BP 118/78 | HR 71 | Ht 64.0 in | Wt 94.4 lb

## 2014-12-14 DIAGNOSIS — I6523 Occlusion and stenosis of bilateral carotid arteries: Secondary | ICD-10-CM | POA: Diagnosis not present

## 2014-12-14 DIAGNOSIS — Z720 Tobacco use: Secondary | ICD-10-CM

## 2014-12-14 DIAGNOSIS — I739 Peripheral vascular disease, unspecified: Secondary | ICD-10-CM | POA: Diagnosis not present

## 2014-12-14 NOTE — Patient Instructions (Signed)
Medication Instructions:  Your physician recommends that you continue on your current medications as directed. Please refer to the Current Medication list given to you today.   Labwork: none  Testing/Procedures: Your physician has requested that you have a carotid duplex. This test is an ultrasound of the carotid arteries in your neck. It looks at blood flow through these arteries that supply the brain with blood. Allow one hour for this exam. There are no restrictions or special instructions. To be done in May 2017  Your physician has requested that you have an ankle brachial index (ABI). During this test an ultrasound and blood pressure cuff are used to evaluate the arteries that supply the arms and legs with blood. Allow thirty minutes for this exam. There are no restrictions or special instructions. To be done in May 2017    Follow-Up:  Your physician wants you to follow-up in: 12 months.  You will receive a reminder letter in the mail two months in advance. If you don't receive a letter, please call our office to schedule the follow-up appointment.

## 2014-12-14 NOTE — Progress Notes (Signed)
Chief Complaint  Patient presents with  . Follow-up    History of Present Illness: 74 yo WF with history of PAD, tobacco abuse, HTN and hyperlipidemia who is here today for PV follow up. I saw her as a new pt in November 2011 for evaluation of PAD. Initial non-invasive studies showed chronic total occlusion of the left SFA with a long segment of total occlusion, reconstitution above the knee. There is moderate disease in the right SFA. Her initial ABI in 2012 was moderately reduced at 0.76 on the right and 0.60 on the left. I recommended that we pursue a diagnostic angiogram if she wished but she refused. Most recent non-invasive studies October 2015 with right ABI 0.89 and left ABI 0.79. Carotid artery dopplers 09/07/14 with 60-79% LICA, 0-39% RICA stenosis.   She has been feeling great. No leg pain with walking. No rest pain or ulcerations. No chest pain or SOB. She is still smoking 7 cigarettes  per day.   Primary Care Physician: Felicity Coyer  Last Lipid Profile:Lipid Panel     Component Value Date/Time   CHOL 195 03/30/2014 1005   TRIG 71.0 03/30/2014 1005   HDL 83.70 03/30/2014 1005   CHOLHDL 2 03/30/2014 1005   VLDL 14.2 03/30/2014 1005   LDLCALC 97 03/30/2014 1005     Past Medical History  Diagnosis Date  . SMOKER   . Peripheral vascular disease, unspecified dx 02/2010 ABI  . ANEMIA-NOS   . DYSLIPIDEMIA   . HYPERTENSION   . Hemorrhoids   . Osteoporosis, senile     DEXA 03/2014: -3.3 R fem    Past Surgical History  Procedure Laterality Date  . Tubal ligation  1979  . Tonsillectomy  1946  . Hemorrhoid surgery  05/18/2011    Procedure: HEMORRHOIDECTOMY;  Surgeon: Robyne Askew, MD;  Location: [facility];  Service: General;  Laterality: N/A;  hemorrhoidectomy, three columns, internal and external  . Cataract extraction w/ intraocular lens implant Right 03/12/14    groat    Current Outpatient Prescriptions  Medication Sig Dispense Refill  . ALPRAZolam (XANAX) 0.25 MG  tablet Take 1 tablet (0.25 mg total) by mouth at bedtime as needed for anxiety. 30 tablet 5  . amLODipine (NORVASC) 5 MG tablet Take 1 tablet (5 mg total) by mouth at bedtime. 90 tablet 3  . aspirin 81 MG tablet Take 81 mg by mouth daily before breakfast.     . atorvastatin (LIPITOR) 20 MG tablet Take 1 tablet (20 mg total) by mouth daily. 90 tablet 3  . calcium-vitamin D (OSCAL WITH D) 500-200 MG-UNIT per tablet Take 2 tablets by mouth daily.    . cilostazol (PLETAL) 50 MG tablet Take 1 tablet (50 mg total) by mouth 2 (two) times daily. 180 tablet 3  . losartan-hydrochlorothiazide (HYZAAR) 50-12.5 MG per tablet Take 1 tablet by mouth daily. 90 tablet 3  . PARoxetine (PAXIL) 10 MG tablet Take 1 tablet (10 mg total) by mouth daily. 90 tablet 3   No current facility-administered medications for this visit.    No Known Allergies  Social History   Social History  . Marital Status: Single    Spouse Name: N/A  . Number of Children: N/A  . Years of Education: N/A   Occupational History  . Not on file.   Social History Main Topics  . Smoking status: Current Some Day Smoker -- 0.50 packs/day for 50 years    Types: Cigarettes  . Smokeless tobacco: Never Used  .  Alcohol Use: 0.0 oz/week    0 Standard drinks or equivalent per week     Comment: Social  . Drug Use: No  . Sexual Activity: Not on file   Other Topics Concern  . Not on file   Social History Narrative   Single, lives alone. Retired Pensions consultant. Enjoys Agricultural consultant work.    Family History  Problem Relation Age of Onset  . Breast cancer Mother   . Hypertension Brother   . Lung cancer Father   . Hypertension Other     Review of Systems:  As stated in the HPI and otherwise negative.   BP 118/78 mmHg  Pulse 71  Ht  (1.626 m)  Wt 94 lb 6.4 oz (42.82 kg)  BMI 16.20 kg/m2  SpO2 97%  Physical Examination: General: Well developed, well nourished, NAD HEENT: OP clear, mucus membranes moist SKIN: warm, dry. No  rashes. Neuro: No focal deficits Musculoskeletal: Muscle strength 5/5 all ext Psychiatric: Mood and affect normal Neck: No JVD, no carotid bruits, no thyromegaly, no lymphadenopathy. Lungs:Clear bilaterally, no wheezes, rhonci, crackles Cardiovascular: Regular rate and rhythm. No murmurs, gallops or rubs. Abdomen:Soft. Bowel sounds present. Non-tender.  Extremities: No lower extremity edema. Pulses are 1+ in the right DP/PT and not palpable DP/PT.  EKG:  EKG is ordered today. The ekg ordered today demonstrates NSR, rate 71 bpm. Non-specific ST and T wave abnormality.   Recent Labs: 03/30/2014: ALT 22; BUN 20; Creatinine, Ser 0.8; Hemoglobin 13.8; Platelets 340.0; Potassium 4.1; Sodium 136; TSH 3.02   Lipid Panel    Component Value Date/Time   CHOL 195 03/30/2014 1005   TRIG 71.0 03/30/2014 1005   HDL 83.70 03/30/2014 1005   CHOLHDL 2 03/30/2014 1005   VLDL 14.2 03/30/2014 1005   LDLCALC 97 03/30/2014 1005   LDLDIRECT 232.1 08/09/2009 1125     Wt Readings from Last 3 Encounters:  12/14/14 94 lb 6.4 oz (42.82 kg)  10/19/14 92 lb 8 oz (41.958 kg)  03/30/14 91 lb 12 oz (41.618 kg)     Other studies Reviewed: Additional studies/ records that were reviewed today include: . Review of the above records demonstrates:    Assessment and Plan:   1. PAD:  Stable. No rest pain or ulcerations. She does get claudication at 300 yards. Will continue ASA and Pletal. Repeat ABI May 2017.   2. Carotid artery disease: 60-79% LICA, 0-39% RICA stenosis by dopplers May 2016. Repeat May 2017.    3. Tobacco abuse: She is trying to stop. I have advised her to stop. Five minutes on smoking counseling.   Current medicines are reviewed at length with the patient today.  The patient does not have concerns regarding medicines.  The following changes have been made:  no change  Labs/ tests ordered today include:   Orders Placed This Encounter  Procedures  . EKG 12-Lead    Disposition:   FU  with me in 12  months  Signed, Verne Carrow, MD 12/15/2014 8:53 AM    [facility] [address] [place], [place], [place]  [address] Phone: [phone]; Fax: [phone]

## 2015-01-07 ENCOUNTER — Ambulatory Visit (INDEPENDENT_AMBULATORY_CARE_PROVIDER_SITE_OTHER): Payer: Medicare Other

## 2015-01-07 DIAGNOSIS — Z23 Encounter for immunization: Secondary | ICD-10-CM | POA: Diagnosis not present

## 2015-01-21 DIAGNOSIS — H26493 Other secondary cataract, bilateral: Secondary | ICD-10-CM | POA: Diagnosis not present

## 2015-02-23 ENCOUNTER — Telehealth: Payer: Self-pay | Admitting: Internal Medicine

## 2015-02-23 NOTE — Telephone Encounter (Signed)
Patient is going to establish care with [facility] for her next visit after her appointment with Felicity CoyerLeschber in December.

## 2015-04-21 ENCOUNTER — Other Ambulatory Visit (INDEPENDENT_AMBULATORY_CARE_PROVIDER_SITE_OTHER): Payer: Medicare Other

## 2015-04-21 ENCOUNTER — Encounter: Payer: Self-pay | Admitting: Internal Medicine

## 2015-04-21 ENCOUNTER — Ambulatory Visit (INDEPENDENT_AMBULATORY_CARE_PROVIDER_SITE_OTHER): Payer: Medicare Other | Admitting: Internal Medicine

## 2015-04-21 VITALS — BP 118/78 | HR 77 | Temp 97.3°F | Ht 64.0 in | Wt 93.5 lb

## 2015-04-21 DIAGNOSIS — R300 Dysuria: Secondary | ICD-10-CM | POA: Diagnosis not present

## 2015-04-21 DIAGNOSIS — F17200 Nicotine dependence, unspecified, uncomplicated: Secondary | ICD-10-CM

## 2015-04-21 DIAGNOSIS — F411 Generalized anxiety disorder: Secondary | ICD-10-CM

## 2015-04-21 DIAGNOSIS — R636 Underweight: Secondary | ICD-10-CM

## 2015-04-21 DIAGNOSIS — M810 Age-related osteoporosis without current pathological fracture: Secondary | ICD-10-CM

## 2015-04-21 DIAGNOSIS — I739 Peripheral vascular disease, unspecified: Secondary | ICD-10-CM | POA: Diagnosis not present

## 2015-04-21 DIAGNOSIS — E785 Hyperlipidemia, unspecified: Secondary | ICD-10-CM | POA: Diagnosis not present

## 2015-04-21 DIAGNOSIS — Z0000 Encounter for general adult medical examination without abnormal findings: Secondary | ICD-10-CM | POA: Diagnosis not present

## 2015-04-21 DIAGNOSIS — I10 Essential (primary) hypertension: Secondary | ICD-10-CM

## 2015-04-21 LAB — BASIC METABOLIC PANEL
BUN: 16 mg/dL (ref 6–23)
CHLORIDE: 92 meq/L — AB (ref 96–112)
CO2: 28 meq/L (ref 19–32)
Calcium: 10 mg/dL (ref 8.4–10.5)
Creatinine, Ser: 0.72 mg/dL (ref 0.40–1.20)
GFR: 83.55 mL/min (ref >60.00)
GLUCOSE: 112 mg/dL — AB (ref 70–99)
POTASSIUM: 3.1 meq/L — AB (ref 3.5–5.1)
Sodium: 131 mEq/L — ABNORMAL LOW (ref 135–145)

## 2015-04-21 LAB — URINALYSIS, ROUTINE W REFLEX MICROSCOPIC
BILIRUBIN URINE: NEGATIVE
Ketones, ur: NEGATIVE
Nitrite: NEGATIVE
PH: 6.5 (ref 5.0–8.0)
Total Protein, Urine: NEGATIVE
Urine Glucose: NEGATIVE
Urobilinogen, UA: 0.2 (ref 0.0–1.0)

## 2015-04-21 LAB — HEPATIC FUNCTION PANEL
ALK PHOS: 48 U/L (ref 39–117)
ALT: 25 U/L (ref 0–35)
AST: 32 U/L (ref 0–37)
Albumin: 4.3 g/dL (ref 3.5–5.2)
BILIRUBIN DIRECT: 0.1 mg/dL (ref 0.0–0.3)
BILIRUBIN TOTAL: 0.4 mg/dL (ref 0.2–1.2)
Total Protein: 8.3 g/dL (ref 6.0–8.3)

## 2015-04-21 LAB — CBC WITH DIFFERENTIAL/PLATELET
BASOS PCT: 0.5 % (ref 0.0–3.0)
Basophils Absolute: 0.1 10*3/uL (ref 0.0–0.1)
EOS PCT: 0.7 % (ref 0.0–5.0)
Eosinophils Absolute: 0.1 10*3/uL (ref 0.0–0.7)
HCT: 40.6 % (ref 36.0–46.0)
Hemoglobin: 13.6 g/dL (ref 12.0–15.0)
LYMPHS ABS: 2.2 10*3/uL (ref 0.7–4.0)
Lymphocytes Relative: 20.3 % (ref 12.0–46.0)
MCHC: 33.6 g/dL (ref 30.0–36.0)
MCV: 91.3 fl (ref 78.0–100.0)
MONO ABS: 0.8 10*3/uL (ref 0.1–1.0)
Monocytes Relative: 7.4 % (ref 3.0–12.0)
NEUTROS ABS: 7.7 10*3/uL (ref 1.4–7.7)
NEUTROS PCT: 71.1 % (ref 43.0–77.0)
Platelets: 370 10*3/uL (ref 150.0–400.0)
RBC: 4.44 Mil/uL (ref 3.87–5.11)
RDW: 14.3 % (ref 11.5–15.5)
WBC: 10.8 10*3/uL — ABNORMAL HIGH (ref 4.0–10.5)

## 2015-04-21 LAB — LIPID PANEL
CHOL/HDL RATIO: 3
Cholesterol: 210 mg/dL — ABNORMAL HIGH (ref 0–200)
HDL: 83.1 mg/dL (ref >39.00)
LDL CALC: 108 mg/dL — AB (ref 0–99)
NonHDL: 126.99
Triglycerides: 94 mg/dL (ref 0.0–149.0)
VLDL: 18.8 mg/dL (ref 0.0–40.0)

## 2015-04-21 LAB — TSH: TSH: 2.46 u[IU]/mL (ref 0.35–4.50)

## 2015-04-21 LAB — VITAMIN D 25 HYDROXY (VIT D DEFICIENCY, FRACTURES): VITD: 36.52 ng/mL (ref 30.00–100.00)

## 2015-04-21 MED ORDER — LOSARTAN POTASSIUM-HCTZ 50-12.5 MG PO TABS: 1.0000 | ORAL_TABLET | Freq: Every day | ORAL | Status: DC

## 2015-04-21 MED ORDER — AMLODIPINE BESYLATE 5 MG PO TABS: 5.0000 mg | ORAL_TABLET | Freq: Every day | ORAL | Status: DC

## 2015-04-21 MED ORDER — ATORVASTATIN CALCIUM 20 MG PO TABS: 20.0000 mg | ORAL_TABLET | Freq: Every day | ORAL | Status: DC

## 2015-04-21 MED ORDER — ALPRAZOLAM 0.25 MG PO TABS: 0.2500 mg | ORAL_TABLET | Freq: Every evening | ORAL | Status: DC | PRN

## 2015-04-21 MED ORDER — CILOSTAZOL 50 MG PO TABS: 50.0000 mg | ORAL_TABLET | Freq: Two times a day (BID) | ORAL | Status: DC

## 2015-04-21 MED ORDER — PAROXETINE HCL 10 MG PO TABS: 10.0000 mg | ORAL_TABLET | Freq: Every day | ORAL | Status: DC

## 2015-04-21 NOTE — Assessment & Plan Note (Signed)
On statin since 07/2009 - unable to tolerate higher dose atorva due to myalgias The current medical regimen is effective;  continue present plan and medications. 

## 2015-04-21 NOTE — Progress Notes (Signed)
Subjective:    Patient ID: Jennifer [name], female    DOB: 16-Mar-1941, 74 y.o.   MRN: [id number]  HPI   Here for medicare wellness  Diet: heart healthy  Physical activity: sedentary Depression/mood screen: negative Hearing: intact to whispered voice Visual acuity: grossly normal, performs annual eye exam  ADLs: capable Fall risk: none Home safety: good Cognitive evaluation: intact to orientation, naming, recall and repetition EOL planning: adv directives, full code/ I agree  I have personally reviewed and have noted 1. The patient's medical and social history 2. Their use of alcohol, tobacco or illicit drugs 3. Their current medications and supplements 4. The patient's functional ability including ADL's, fall risks, home safety risks and hearing or visual impairment. 5. Diet and physical activities 6. Evidence for depression or mood disorders  Also reviewed chronic medical conditions, interval events and current concerns  Past Medical History  Diagnosis Date  . SMOKER   . Peripheral vascular disease, unspecified (HCC) dx 02/2010 ABI  . ANEMIA-NOS   . DYSLIPIDEMIA   . HYPERTENSION   . Hemorrhoids   . Osteoporosis, senile     DEXA 03/2014: -3.3 R fem   Family History  Problem Relation Age of Onset  . Breast cancer Mother   . Hypertension Brother   . Lung cancer Father   . Hypertension Other    Social History  Substance Use Topics  . Smoking status: Current Some Day Smoker -- 0.50 packs/day for 50 years    Types: Cigarettes  . Smokeless tobacco: Never Used  . Alcohol Use: 0.0 oz/week    0 Standard drinks or equivalent per week     Comment: Social    Review of Systems  Constitutional: Negative for fatigue and unexpected weight change.  Respiratory: Negative for cough, shortness of breath and wheezing.   Cardiovascular: Negative for chest pain, palpitations and leg swelling.  Gastrointestinal: Negative for nausea, abdominal pain and diarrhea.    Neurological: Negative for dizziness, weakness, light-headedness and headaches.  Psychiatric/Behavioral: Negative for dysphoric mood. The patient is not nervous/anxious.   All other systems reviewed and are negative.   Patient Care Team: Newt Lukes, MD as PCP - General Kathleene Hazel, MD as Consulting Physician (Cardiology) Ernesto Rutherford, MD (Ophthalmology)     Objective:    Physical Exam  Constitutional: She is oriented to person, place, and time. She appears well-developed and well-nourished. No distress.  underweight  HENT:  Head: Normocephalic and atraumatic.  Right Ear: External ear normal.  Left Ear: External ear normal.  Nose: Nose normal.  Mouth/Throat: Oropharynx is clear and moist. No oropharyngeal exudate.  Eyes: EOM are normal. Pupils are equal, round, and reactive to light. Right eye exhibits no discharge. Left eye exhibits no discharge. No scleral icterus.  Neck: Normal range of motion. Neck supple. No JVD present. No tracheal deviation present. No thyromegaly present.  Cardiovascular: Normal rate, regular rhythm, normal heart sounds and intact distal pulses.  Exam reveals no friction rub.   No murmur heard. Pulmonary/Chest: Effort normal and breath sounds normal. No respiratory distress. She has no wheezes. She has no rales. She exhibits no tenderness.  Abdominal: Soft. Bowel sounds are normal. She exhibits no distension and no mass. There is no tenderness. There is no rebound and no guarding.  Genitourinary:  Defer to gyn  Musculoskeletal: Normal range of motion.  No gross deformities  Lymphadenopathy:    She has no cervical adenopathy.  Neurological: She is alert and oriented to person,  place, and time. She has normal reflexes. No cranial nerve deficit.  Skin: Skin is warm and dry. No rash noted. She is not diaphoretic. No erythema.  Psychiatric: She has a normal mood and affect. Her behavior is normal. Judgment and thought content normal.   Nursing note and vitals reviewed.   BP 118/78 mmHg  Pulse 77  Temp(Src) 97.3 F (36.3 C) (Oral)  Ht  (1.626 m)  Wt 93 lb 8 oz (42.411 kg)  BMI 16.04 kg/m2  SpO2 97% Wt Readings from Last 3 Encounters:  04/21/15 93 lb 8 oz (42.411 kg)  12/14/14 94 lb 6.4 oz (42.82 kg)  10/19/14 92 lb 8 oz (41.958 kg)    Lab Results  Component Value Date   WBC 10.6* 03/30/2014   HGB 13.8 03/30/2014   HCT 41.1 03/30/2014   PLT 340.0 03/30/2014   GLUCOSE 104* 03/30/2014   CHOL 195 03/30/2014   TRIG 71.0 03/30/2014   HDL 83.70 03/30/2014   LDLDIRECT 232.1 08/09/2009   LDLCALC 97 03/30/2014   ALT 22 03/30/2014   AST 31 03/30/2014   NA 136 03/30/2014   K 4.1 03/30/2014   CL 98 03/30/2014   CREATININE 0.8 03/30/2014   BUN 20 03/30/2014   CO2 28 03/30/2014   TSH 3.02 03/30/2014    Dg Bone Density  04/09/2014  Date of study: 04/06/2014 Exam: DUAL X-RAY ABSORPTIOMETRY (DXA) FOR BONE MINERAL DENSITY (BMD) Instrument: [employer] Requesting Provider: Dr. Felicity Coyer Indication: f/u for osteoporosis Comparison: 05/09/2011 Clinical data: Pt is a 74 y.o. female without h/o fractures. On vitamin D and calcium Results:  Lumbar spine L1-L4 (L3) Femoral neck (FN) T-score - 0.5 RFN: - 3.3 LFN: - 3.1 Change in BMD from previous DXA test (*) + 3.1%* - 6.6%* (*) statistically significant Assessment: Patient has OSTEOPOROSIS according to the [facility] classification for osteoporosis (see below). Fracture risk: high Comments: the technical quality of the study is good, however, the spine is scoliotic and arthritic. Calcium accumulation in arthritic sites can confound the results of the bone density scan. L3 had to be excluded from analysis. Evaluation for secondary causes should be considered if clinically indicated. Recommend optimizing calcium (1200 mg/day) and vitamin D (800 IU/day). Treatment is indicated. Followup: Repeat BMD is appropriate after 2 years or after 1-2 years if starting treatment. WHO  criteria for diagnosis of osteoporosis in postmenopausal women and in men 69 y/o or older: - normal: T-score -1.0 to + 1.0 - osteopenia/low bone density: T-score between -2.5 and -1.0 - osteoporosis: T-score below -2.5 - severe osteoporosis: T-score below -2.5 with history of fragility fracture Note: although not part of the WHO classification, the presence of a fragility fracture, regardless of the T-score, should be considered diagnostic of osteoporosis, provided other causes for the fracture have been excluded. Treatment: The National Osteoporosis Foundation recommends that treatment be considered in postmenopausal women and men age 30 or older with: 1. Hip or vertebral (clinical or morphometric) fracture 2. T-score of - 2.5 or lower at the spine or hip 3. 10-year fracture probability by FRAX of at least 20% for a major osteoporotic fracture and 3% for a hip fracture Carlus Pavlov, MD [place] [facility]       Assessment & Plan:   AWV/z00.00 - Today patient counseled on age appropriate routine health concerns for screening and prevention, each reviewed and up to date or declined. Immunizations reviewed and up to date or declined. Labs ordered and reviewed. Risk factors for depression reviewed and negative.  Hearing function and visual acuity are intact. ADLs screened and addressed as needed. Functional ability and level of safety reviewed and appropriate. Education, counseling and referrals performed based on assessed risks today. Patient provided with a copy of personalized plan for preventive services.  ? Dysuria. Note asymptomatic bacteriuria December 2015. We'll recheck urinalysis again today and treat if abnormal  Underweight. No significant weight changes. Encouraged to continue efforts at smoking cessation and increased caloric intake for goal BMI greater than 21  Problem List Items Addressed This Visit    Anxiety state    Chronic symptoms, exacerbated by family illness 01/2012 and death  of her pet 03/2012 - has new dog since late 2014 Started low-dose paroxetine 06/2012 - improved symptoms with same also prn alprazolam for anxiety symptoms - ok to continue same - refills today The current medical regimen is effective;  continue present plan and medications.       Relevant Medications   ALPRAZolam (XANAX) 0.25 MG tablet   PARoxetine (PAXIL) 10 MG tablet   Other Relevant Orders   Hepatic function panel   TSH   Dyslipidemia    On statin since 07/2009 - unable to tolerate higher dose atorva due to myalgias The current medical regimen is effective;  continue present plan and medications.      Relevant Medications   atorvastatin (LIPITOR) 20 MG tablet   Other Relevant Orders   Lipid panel   Osteoporosis, senile    Dx 04/2011 - trial fosamax caused nausea so pt self d/c'd same Progression on 03/2014 DEXA reviewed, but declines tx other than Ca+D Encouraged to consider alternative therapy such as Prolia - education on same provided again today Again, also encouraged tobacco cessation and WB execises      Relevant Orders   Hepatic function panel   TSH   VITAMIN D 25 Hydroxy (Vit-D Deficiency, Fractures)   PAD (peripheral artery disease) (HCC)    Seen initially by Dr. Clifton JamesMcAlhany 02/2010 for same -  ABIs reviewed - improved on 08/2010 ABI, - stable ABI each year - follows annually and prn with cards, next US 08/2015 Carotid ultrasound with 60-79% ICAS on left, next due 08/2015 on pletal + ASA, less smoking and statin -  Reports improved pain symptoms on current treatment, even with increased walking Continue to work on risk factor modification and follow up cards as planned      Relevant Medications   losartan-hydrochlorothiazide (HYZAAR) 50-12.5 MG tablet   atorvastatin (LIPITOR) 20 MG tablet   amLODipine (NORVASC) 5 MG tablet   Other Relevant Orders   Basic metabolic panel   CBC with Differential/Platelet   Hepatic function panel   Lipid panel   SMOKER    5 minutes  today spent counseling patient on unhealthy effects of continued tobacco abuse and encouragement of cessation including medical options available to help the patient quit smoking. Also discussed potential CT screening for lung cancer given qualifying number of pack years. Patient declines need for same today, but will reconsider downstream as needed       Other Visit Diagnoses    Routine general medical examination at a health care facility    -  Primary    Dysuria        Relevant Orders    Urinalysis, Routine w reflex microscopic (not [facility])        Rene PaciValerie Leschber, MD

## 2015-04-21 NOTE — Assessment & Plan Note (Signed)
Chronic symptoms, exacerbated by family illness 01/2012 and death of her pet 03/2012 - has new dog since late 2014 Started low-dose paroxetine 06/2012 - improved symptoms with same also prn alprazolam for anxiety symptoms - ok to continue same - refills today The current medical regimen is effective;  continue present plan and medications.

## 2015-04-21 NOTE — Patient Instructions (Addendum)
It was good to see you today.  We have reviewed your prior records including labs and tests today  Health Maintenance reviewed - all recommended immunizations and age-appropriate screenings are up-to-date.  Test(s) ordered today. Your results will be released to MyChart (or called to you) after review, usually within 72hours after test completion. If any changes need to be made, you will be notified at that same time.  Medications reviewed and updated, no changes recommended at this time.  Please schedule followup in 6 months for semiannual exam and labs, call sooner if problems.  Health Maintenance, Female Adopting a healthy lifestyle and getting preventive care can go a long way to promote health and wellness. Talk with your health care provider about what schedule of regular examinations is right for you. This is a good chance for you to check in with your provider about disease prevention and staying healthy. In between checkups, there are plenty of things you can do on your own. Experts have done a lot of research about which lifestyle changes and preventive measures are most likely to keep you healthy. Ask your health care provider for more information. WEIGHT AND DIET  Eat a healthy diet  Be sure to include plenty of vegetables, fruits, low-fat dairy products, and lean protein.  Do not eat a lot of foods high in solid fats, added sugars, or salt.  Get regular exercise. This is one of the most important things you can do for your health.  Most adults should exercise for at least 150 minutes each week. The exercise should increase your heart rate and make you sweat (moderate-intensity exercise).  Most adults should also do strengthening exercises at least twice a week. This is in addition to the moderate-intensity exercise.  Maintain a healthy weight  Body mass index (BMI) is a measurement that can be used to identify possible weight problems. It estimates body fat based on height  and weight. Your health care provider can help determine your BMI and help you achieve or maintain a healthy weight.  For females 20 years of age and older:   A BMI below 18.5 is considered underweight.  A BMI of 18.5 to 24.9 is normal.  A BMI of 25 to 29.9 is considered overweight.  A BMI of 30 and above is considered obese.  Watch levels of cholesterol and blood lipids  You should start having your blood tested for lipids and cholesterol at 76 years of age, then have this test every 5 years.  You may need to have your cholesterol levels checked more often if:  Your lipid or cholesterol levels are high.  You are older than 76 years of age.  You are at high risk for heart disease.  CANCER SCREENING   Lung Cancer  Lung cancer screening is recommended for adults 55-80 years old who are at high risk for lung cancer because of a history of smoking.  A yearly low-dose CT scan of the lungs is recommended for people who:  Currently smoke.  Have quit within the past 15 years.  Have at least a 30-pack-year history of smoking. A pack year is smoking an average of one pack of cigarettes a day for 1 year.  Yearly screening should continue until it has been 15 years since you quit.  Yearly screening should stop if you develop a health problem that would prevent you from having lung cancer treatment.  Breast Cancer  Practice breast self-awareness. This means understanding how your breasts normally appear   and feel.  It also means doing regular breast self-exams. Let your health care provider know about any changes, no matter how small.  If you are in your 20s or 30s, you should have a clinical breast exam (CBE) by a health care provider every 1-3 years as part of a regular health exam.  If you are 72 or older, have a CBE every year. Also consider having a breast X-ray (mammogram) every year.  If you have a family history of breast cancer, talk to your health care provider about  genetic screening.  If you are at high risk for breast cancer, talk to your health care provider about having an MRI and a mammogram every year.  Breast cancer gene (BRCA) assessment is recommended for women who have family members with BRCA-related cancers. BRCA-related cancers include:  Breast.  Ovarian.  Tubal.  Peritoneal cancers.  Results of the assessment will determine the need for genetic counseling and BRCA1 and BRCA2 testing. Cervical Cancer Your health care provider may recommend that you be screened regularly for cancer of the pelvic organs (ovaries, uterus, and vagina). This screening involves a pelvic examination, including checking for microscopic changes to the surface of your cervix (Pap test). You may be encouraged to have this screening done every 3 years, beginning at age 68.  For women ages 23-65, health care providers may recommend pelvic exams and Pap testing every 3 years, or they may recommend the Pap and pelvic exam, combined with testing for human papilloma virus (HPV), every 5 years. Some types of HPV increase your risk of cervical cancer. Testing for HPV may also be done on women of any age with unclear Pap test results.  Other health care providers may not recommend any screening for nonpregnant women who are considered low risk for pelvic cancer and who do not have symptoms. Ask your health care provider if a screening pelvic exam is right for you.  If you have had past treatment for cervical cancer or a condition that could lead to cancer, you need Pap tests and screening for cancer for at least 20 years after your treatment. If Pap tests have been discontinued, your risk factors (such as having a new sexual partner) need to be reassessed to determine if screening should resume. Some women have medical problems that increase the chance of getting cervical cancer. In these cases, your health care provider may recommend more frequent screening and Pap  tests. Colorectal Cancer  This type of cancer can be detected and often prevented.  Routine colorectal cancer screening usually begins at 76 years of age and continues through 76 years of age.  Your health care provider may recommend screening at an earlier age if you have risk factors for colon cancer.  Your health care provider may also recommend using home test kits to check for hidden blood in the stool.  A small camera at the end of a tube can be used to examine your colon directly (sigmoidoscopy or colonoscopy). This is done to check for the earliest forms of colorectal cancer.  Routine screening usually begins at age 52.  Direct examination of the colon should be repeated every 5-10 years through 76 years of age. However, you may need to be screened more often if early forms of precancerous polyps or small growths are found. Skin Cancer  Check your skin from head to toe regularly.  Tell your health care provider about any new moles or changes in moles, especially if there is a  change in a mole's shape or color.  Also tell your health care provider if you have a mole that is larger than the size of a pencil eraser.  Always use sunscreen. Apply sunscreen liberally and repeatedly throughout the day.  Protect yourself by wearing long sleeves, pants, a wide-brimmed hat, and sunglasses whenever you are outside. HEART DISEASE, DIABETES, AND HIGH BLOOD PRESSURE   High blood pressure causes heart disease and increases the risk of stroke. High blood pressure is more likely to develop in:  People who have blood pressure in the high end of the normal range (130-139/85-89 mm Hg).  People who are overweight or obese.  People who are African American.  If you are 60-60 years of age, have your blood pressure checked every 3-5 years. If you are 66 years of age or older, have your blood pressure checked every year. You should have your blood pressure measured twice--once when you are at a  hospital or clinic, and once when you are not at a hospital or clinic. Record the average of the two measurements. To check your blood pressure when you are not at a hospital or clinic, you can use:  An automated blood pressure machine at a pharmacy.  A home blood pressure monitor.  If you are between 38 years and 36 years old, ask your health care provider if you should take aspirin to prevent strokes.  Have regular diabetes screenings. This involves taking a blood sample to check your fasting blood sugar level.  If you are at a normal weight and have a low risk for diabetes, have this test once every three years after 76 years of age.  If you are overweight and have a high risk for diabetes, consider being tested at a younger age or more often. PREVENTING INFECTION  Hepatitis B  If you have a higher risk for hepatitis B, you should be screened for this virus. You are considered at high risk for hepatitis B if:  You were born in a country where hepatitis B is common. Ask your health care provider which countries are considered high risk.  Your parents were born in a high-risk country, and you have not been immunized against hepatitis B (hepatitis B vaccine).  You have HIV or AIDS.  You use needles to inject street drugs.  You live with someone who has hepatitis B.  You have had sex with someone who has hepatitis B.  You get hemodialysis treatment.  You take certain medicines for conditions, including cancer, organ transplantation, and autoimmune conditions. Hepatitis C  Blood testing is recommended for:  Everyone born from [birth year] through 1967.  Anyone with known risk factors for hepatitis C. Sexually transmitted infections (STIs)  You should be screened for sexually transmitted infections (STIs) including gonorrhea and chlamydia if:  You are sexually active and are younger than 76 years of age.  You are older than 76 years of age and your health care provider tells you  that you are at risk for this type of infection.  Your sexual activity has changed since you were last screened and you are at an increased risk for chlamydia or gonorrhea. Ask your health care provider if you are at risk.  If you do not have HIV, but are at risk, it may be recommended that you take a prescription medicine daily to prevent HIV infection. This is called pre-exposure prophylaxis (PrEP). You are considered at risk if:  You are sexually active and do not regularly use  condoms or know the HIV status of your partner(s).  You take drugs by injection.  You are sexually active with a partner who has HIV. Talk with your health care provider about whether you are at high risk of being infected with HIV. If you choose to begin PrEP, you should first be tested for HIV. You should then be tested every 3 months for as long as you are taking PrEP.  PREGNANCY   If you are premenopausal and you may become pregnant, ask your health care provider about preconception counseling.  If you may become pregnant, take 400 to 800 micrograms (mcg) of folic acid every day.  If you want to prevent pregnancy, talk to your health care provider about birth control (contraception). OSTEOPOROSIS AND MENOPAUSE   Osteoporosis is a disease in which the bones lose minerals and strength with aging. This can result in serious bone fractures. Your risk for osteoporosis can be identified using a bone density scan.  If you are 38 years of age or older, or if you are at risk for osteoporosis and fractures, ask your health care provider if you should be screened.  Ask your health care provider whether you should take a calcium or vitamin D supplement to lower your risk for osteoporosis.  Menopause may have certain physical symptoms and risks.  Hormone replacement therapy may reduce some of these symptoms and risks. Talk to your health care provider about whether hormone replacement therapy is right for you.  HOME  CARE INSTRUCTIONS   Schedule regular health, dental, and eye exams.  Stay current with your immunizations.   Do not use any tobacco products including cigarettes, chewing tobacco, or electronic cigarettes.  If you are pregnant, do not drink alcohol.  If you are breastfeeding, limit how much and how often you drink alcohol.  Limit alcohol intake to no more than 1 drink per day for nonpregnant women. One drink equals 12 ounces of beer, 5 ounces of wine, or 1 ounces of hard liquor.  Do not use street drugs.  Do not share needles.  Ask your health care provider for help if you need support or information about quitting drugs.  Tell your health care provider if you often feel depressed.  Tell your health care provider if you have ever been abused or do not feel safe at home.   This information is not intended to replace advice given to you by your health care provider. Make sure you discuss any questions you have with your health care provider.   Document Released: 10/24/2010 Document Revised: 05/01/2014 Document Reviewed: 03/12/2013 Elsevier Interactive Patient Education [employer].

## 2015-04-21 NOTE — Progress Notes (Signed)
Pre visit review using our clinic review tool, if applicable. No additional management support is needed unless otherwise documented below in the visit note. 

## 2015-04-21 NOTE — Assessment & Plan Note (Signed)
Seen initially by Dr. Clifton JamesMcAlhany 02/2010 for same -  ABIs reviewed - improved on 08/2010 ABI, - stable ABI each year - follows annually and prn with cards, next US 08/2015 Carotid ultrasound with 60-79% ICAS on left, next due 08/2015 on pletal + ASA, less smoking and statin -  Reports improved pain symptoms on current treatment, even with increased walking Continue to work on risk factor modification and follow up cards as planned

## 2015-04-21 NOTE — Assessment & Plan Note (Signed)
5 minutes today spent counseling patient on unhealthy effects of continued tobacco abuse and encouragement of cessation including medical options available to help the patient quit smoking. Also discussed potential CT screening for lung cancer given qualifying number of pack years. Patient declines need for same today, but will reconsider downstream as needed

## 2015-04-21 NOTE — Assessment & Plan Note (Signed)
Dx 04/2011 - trial fosamax caused nausea so pt self d/c'd same Progression on 03/2014 DEXA reviewed, but declines tx other than Ca+D Encouraged to consider alternative therapy such as Prolia - education on same provided again today Again, also encouraged tobacco cessation and WB execises

## 2015-09-01 ENCOUNTER — Other Ambulatory Visit: Payer: Self-pay | Admitting: Cardiovascular Disease

## 2015-09-01 DIAGNOSIS — I739 Peripheral vascular disease, unspecified: Secondary | ICD-10-CM

## 2015-09-14 ENCOUNTER — Ambulatory Visit (HOSPITAL_COMMUNITY)
Admission: RE | Admit: 2015-09-14 | Discharge: 2015-09-14 | Disposition: A | Discharge status: Home / Self Care | Payer: Medicare Other | Source: Ambulatory Visit | Attending: Cardiology | Admitting: Cardiology

## 2015-09-14 ENCOUNTER — Ambulatory Visit (HOSPITAL_COMMUNITY)
Admission: RE | Admit: 2015-09-14 | Discharge: 2015-09-14 | Disposition: A | Discharge status: Home / Self Care | Payer: Medicare Other | Source: Ambulatory Visit | Attending: Cardiovascular Disease | Admitting: Cardiovascular Disease

## 2015-09-14 DIAGNOSIS — I6523 Occlusion and stenosis of bilateral carotid arteries: Secondary | ICD-10-CM | POA: Insufficient documentation

## 2015-09-14 DIAGNOSIS — R938 Abnormal findings on diagnostic imaging of other specified body structures: Secondary | ICD-10-CM | POA: Diagnosis not present

## 2015-09-14 DIAGNOSIS — I10 Essential (primary) hypertension: Secondary | ICD-10-CM | POA: Insufficient documentation

## 2015-09-14 DIAGNOSIS — I739 Peripheral vascular disease, unspecified: Secondary | ICD-10-CM | POA: Diagnosis not present

## 2015-09-14 DIAGNOSIS — Z720 Tobacco use: Secondary | ICD-10-CM | POA: Insufficient documentation

## 2015-09-14 DIAGNOSIS — I779 Disorder of arteries and arterioles, unspecified: Secondary | ICD-10-CM | POA: Diagnosis present

## 2015-09-14 DIAGNOSIS — E785 Hyperlipidemia, unspecified: Secondary | ICD-10-CM | POA: Insufficient documentation

## 2015-10-20 ENCOUNTER — Ambulatory Visit (INDEPENDENT_AMBULATORY_CARE_PROVIDER_SITE_OTHER): Payer: Medicare Other | Admitting: Internal Medicine

## 2015-10-20 ENCOUNTER — Encounter: Payer: Self-pay | Admitting: Internal Medicine

## 2015-10-20 VITALS — BP 110/60 | HR 69 | Temp 97.9°F | Ht 64.0 in | Wt 95.0 lb

## 2015-10-20 DIAGNOSIS — E785 Hyperlipidemia, unspecified: Secondary | ICD-10-CM | POA: Diagnosis not present

## 2015-10-20 DIAGNOSIS — I10 Essential (primary) hypertension: Secondary | ICD-10-CM

## 2015-10-20 DIAGNOSIS — I739 Peripheral vascular disease, unspecified: Secondary | ICD-10-CM

## 2015-10-20 DIAGNOSIS — M810 Age-related osteoporosis without current pathological fracture: Secondary | ICD-10-CM

## 2015-10-20 DIAGNOSIS — F411 Generalized anxiety disorder: Secondary | ICD-10-CM

## 2015-10-20 DIAGNOSIS — I779 Disorder of arteries and arterioles, unspecified: Secondary | ICD-10-CM

## 2015-10-20 MED ORDER — PAROXETINE HCL 10 MG PO TABS: 10.0000 mg | ORAL_TABLET | Freq: Every day | ORAL | Status: DC

## 2015-10-20 MED ORDER — ATORVASTATIN CALCIUM 20 MG PO TABS: 20.0000 mg | ORAL_TABLET | Freq: Every day | ORAL | Status: DC

## 2015-10-20 MED ORDER — AMLODIPINE BESYLATE 5 MG PO TABS: 5.0000 mg | ORAL_TABLET | Freq: Every day | ORAL | Status: DC

## 2015-10-20 MED ORDER — LOSARTAN POTASSIUM-HCTZ 50-12.5 MG PO TABS
1.0000 | ORAL_TABLET | Freq: Every day | ORAL | Status: DC
Start: 2015-10-20 — End: 2016-10-11

## 2015-10-20 NOTE — Assessment & Plan Note (Addendum)
dexa due 03/2016 Taking calcium and vitamin d Walking the dog daily Tried fosamax - caused nasuea Will discuss prolia after next dexa

## 2015-10-20 NOTE — Progress Notes (Signed)
Pre visit review using our clinic review tool, if applicable. No additional management support is needed unless otherwise documented below in the visit note. 

## 2015-10-20 NOTE — Progress Notes (Signed)
Subjective:    Patient ID: Jennifer BackersSusan L [name], female    DOB: Nov 19, 1940, 75 y.o.   MRN: [id number]  HPI She is here to establish with a new pcp.   She is here for follow up.  Hypertension: She is taking her medication daily. She is compliant with a low sodium diet.  She denies chest pain, palpitations, edema, shortness of breath and regular headaches. She is exercising regularly - walks dog.  She does monitor her blood pressure at home - well controlled.    Hyperlipidemia: She is taking her medication daily. She is compliant with a low fat/cholesterol diet. She is exercising regularly. She denies myalgias.   Osteoporosis:  She is taking calcium and vitamin d daily.  She walks her dog regularly. She is smoking again and is working on quitting.  Anxiety: She is taking her medication daily as prescribed. She takes the xanax only as needed.  She denies any side effects from the medication. She feels her anxiety is well controlled and she is happy with her current dose of medication.    Medications and allergies reviewed with patient and updated if appropriate.  Patient Active Problem List   Diagnosis Date Noted  . Anxiety state   . PAD (peripheral artery disease) (HCC) 10/10/2011  . Carotid bruit 10/10/2011  . Osteoporosis, senile 05/17/2011  . Tachycardia 08/15/2010  . Dyslipidemia 05/03/2009  . SMOKER 04/14/2009  . Essential hypertension 04/14/2009    Current Outpatient Prescriptions on File Prior to Visit  Medication Sig Dispense Refill  . ALPRAZolam (XANAX) 0.25 MG tablet Take 1 tablet (0.25 mg total) by mouth at bedtime as needed for anxiety. 30 tablet 5  . aspirin 81 MG tablet Take 81 mg by mouth daily before breakfast.     . calcium-vitamin D (OSCAL WITH D) 500-200 MG-UNIT per tablet Take 2 tablets by mouth daily.    . cilostazol (PLETAL) 50 MG tablet Take 1 tablet (50 mg total) by mouth 2 (two) times daily. 180 tablet 3   No current facility-administered medications on  file prior to visit.    Past Medical History  Diagnosis Date  . SMOKER   . Peripheral vascular disease, unspecified (HCC) dx 02/2010 ABI  . ANEMIA-NOS   . DYSLIPIDEMIA   . HYPERTENSION   . Hemorrhoids   . Osteoporosis, senile     DEXA 03/2014: -3.3 R fem    Past Surgical History  Procedure Laterality Date  . Tubal ligation  1979  . Tonsillectomy  1946  . Hemorrhoid surgery  05/18/2011    Procedure: HEMORRHOIDECTOMY;  Surgeon: Robyne AskewPaul S Toth III, MD;  Location: [facility];  Service: General;  Laterality: N/A;  hemorrhoidectomy, three columns, internal and external  . Cataract extraction w/ intraocular lens implant Right 03/12/14    groat    Social History   Social History  . Marital Status: Single    Spouse Name: N/A  . Number of Children: N/A  . Years of Education: N/A   Social History Main Topics  . Smoking status: Current Some Day Smoker -- 0.50 packs/day for 50 years    Types: Cigarettes  . Smokeless tobacco: Never Used  . Alcohol Use: 0.0 oz/week    0 Standard drinks or equivalent per week     Comment: Social  . Drug Use: No  . Sexual Activity: Not Asked   Other Topics Concern  . None   Social History Narrative   Single, lives alone. 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  Family History  Problem Relation Age of Onset  . Breast cancer Mother   . Hypertension Brother   . Lung cancer Father   . Hypertension Other     Review of Systems  Constitutional: Negative for fever.  Respiratory: Negative for cough, shortness of breath and wheezing.   Cardiovascular: Negative for chest pain, palpitations and leg swelling.  Gastrointestinal: Negative for nausea and abdominal pain.       No gerd  Neurological: Negative for dizziness, light-headedness and headaches.       Objective:   Filed Vitals:   10/20/15 1410  BP: 110/60  Pulse: 69  Temp: 97.9 F (36.6 C)   Filed Weights   10/20/15 1410  Weight: 95 lb (43.092 kg)   Body mass index is 16.3  kg/(m^2).   Physical Exam Constitutional: Appears well-developed and well-nourished. No distress.  Neck: Neck supple. No tracheal deviation present. No thyromegaly present.  No carotid bruit. No cervical adenopathy.   Cardiovascular: Normal rate, regular rhythm and normal heart sounds.   No murmur heard.  No edema Pulmonary/Chest: Effort normal and breath sounds normal. No respiratory distress. No wheezes.       Assessment & Plan:   See Problem List for Assessment and Plan of chronic medical problems.  F/u in 6 months for CPE

## 2015-10-20 NOTE — Assessment & Plan Note (Addendum)
Some leg pain when she first starts walking in the morning Takes pletal daily

## 2015-10-20 NOTE — Assessment & Plan Note (Signed)
BP well controlled Current regimen effective and well tolerated Continue current medications at current doses  

## 2015-10-20 NOTE — Assessment & Plan Note (Signed)
Well controlled lipids Continue current dose of lipitor

## 2015-10-20 NOTE — Assessment & Plan Note (Signed)
Controlled, stable Continue current dose of medication Takes xanax only as needed

## 2015-10-20 NOTE — Patient Instructions (Addendum)
  All other Health Maintenance issues reviewed.   All recommended immunizations and age-appropriate screenings are up-to-date or discussed.  No immunizations administered today.   Medications reviewed and updated.  No changes recommended at this time.  Your prescription(s) have been submitted to your pharmacy. Please take as directed and contact our office if you believe you are having problem(s) with the medication(s).   Please followup in 6 months for a physical   

## 2015-11-11 ENCOUNTER — Telehealth: Payer: Self-pay | Admitting: Emergency Medicine

## 2015-11-11 NOTE — Telephone Encounter (Signed)
Pt called and needs a refill on ALPRAZolam (XANAX) 0.25 MG tablet. [facility] [address] Please follow up thanks.

## 2015-11-11 NOTE — Telephone Encounter (Signed)
Ok to fill 

## 2015-11-11 NOTE — Telephone Encounter (Signed)
Last filled by Felicity CoyerLeschber, last OV 10/20/15, okay to refill?

## 2015-11-12 MED ORDER — ALPRAZOLAM 0.25 MG PO TABS: 0.2500 mg | ORAL_TABLET | Freq: Every evening | ORAL | Status: DC | PRN

## 2015-11-12 NOTE — Telephone Encounter (Signed)
Patient called again about this medication. Can you send it for her. Please and thank you.

## 2015-11-12 NOTE — Telephone Encounter (Signed)
RX faxed to [facility] 

## 2015-11-12 NOTE — Addendum Note (Signed)
Addended by: Zenovia JordanMITCHELL, Taryn Nave B on: 11/12/2015 04:17 PM   Modules accepted: Orders

## 2015-12-15 ENCOUNTER — Other Ambulatory Visit: Payer: Self-pay | Admitting: Internal Medicine

## 2015-12-22 ENCOUNTER — Ambulatory Visit (INDEPENDENT_AMBULATORY_CARE_PROVIDER_SITE_OTHER): Payer: Medicare Other | Admitting: Cardiovascular Disease

## 2015-12-22 VITALS — BP 130/80 | HR 72 | Ht 64.0 in | Wt 97.4 lb

## 2015-12-22 DIAGNOSIS — I739 Peripheral vascular disease, unspecified: Secondary | ICD-10-CM

## 2015-12-22 DIAGNOSIS — I6523 Occlusion and stenosis of bilateral carotid arteries: Secondary | ICD-10-CM | POA: Diagnosis not present

## 2015-12-22 DIAGNOSIS — Z720 Tobacco use: Secondary | ICD-10-CM

## 2015-12-22 NOTE — Progress Notes (Signed)
Chief Complaint  Patient presents with  . PAD    History of Present Illness: 75 yo [name] with history of PAD, tobacco abuse, HTN and hyperlipidemia who is here today for PV follow up. I saw her as a new pt in November 2011 for evaluation of PAD. Initial non-invasive studies showed chronic total occlusion of the left SFA with a long segment of total occlusion, reconstitution above the knee. There is moderate disease in the right SFA. Her initial ABI in 2012 was moderately reduced at 0.76 on the right and 0.Jennifer on the left. I recommended that we pursue a diagnostic angiogram if she wished but she refused. Most recent non-invasive studies May 2017 with right ABI 0.94 and left ABI 0.76. Carotid artery dopplers 09/07/14 with 40-59% LICA, 0-39% RICA stenosis.   She has been feeling great. No leg pain with walking. No rest pain or ulcerations. No chest pain or SOB. She is still smoking 1/2 ppd.    Primary Care Physician: [name] Sanes, MD   Past Medical History:  Diagnosis Date  . ANEMIA-NOS   . DYSLIPIDEMIA   . Hemorrhoids   . HYPERTENSION   . Osteoporosis, senile    DEXA 03/2014: -3.3 R fem  . Peripheral vascular disease, unspecified (HCC) dx 02/2010 ABI  . SMOKER     Past Surgical History:  Procedure Laterality Date  . CATARACT EXTRACTION W/ INTRAOCULAR LENS IMPLANT Right 03/12/14   groat  . HEMORRHOID SURGERY  05/18/2011   Procedure: HEMORRHOIDECTOMY;  Surgeon: Robyne Askew, MD;  Location: [facility];  Service: General;  Laterality: N/A;  hemorrhoidectomy, three columns, internal and external  . TONSILLECTOMY  1946  . TUBAL LIGATION  1979    Current Outpatient Prescriptions  Medication Sig Dispense Refill  . ALPRAZolam (XANAX) 0.25 MG tablet Take 1 tablet (0.25 mg total) by mouth at bedtime as needed for anxiety. 30 tablet 1  . amLODipine (NORVASC) 5 MG tablet Take 1 tablet (5 mg total) by mouth at bedtime. 90 tablet 3  . aspirin 81 MG tablet Take 81 mg by mouth daily before  breakfast.     . atorvastatin (LIPITOR) 20 MG tablet Take 1 tablet (20 mg total) by mouth daily. 90 tablet 3  . calcium-vitamin D (OSCAL WITH D) 500-200 MG-UNIT per tablet Take 2 tablets by mouth daily.    . cilostazol (PLETAL) 50 MG tablet Take 1 tablet (50 mg total) by mouth 2 (two) times daily. 180 tablet 3  . losartan-hydrochlorothiazide (HYZAAR) 50-12.5 MG tablet Take 1 tablet by mouth daily. 90 tablet 3  . PARoxetine (PAXIL) 10 MG tablet Take 1 tablet (10 mg total) by mouth daily. 90 tablet 3   No current facility-administered medications for this visit.     No Known Allergies  Social History   Social History  . Marital status: Single    Spouse name: N/A  . Number of children: N/A  . Years of education: N/A   Occupational History  . Not on file.   Social History Main Topics  . Smoking status: Current Some Day Smoker    Packs/day: 0.50    Years: 50.00    Types: Cigarettes  . Smokeless tobacco: Never Used  . Alcohol use 0.0 oz/week     Comment: Social  . Drug use: No  . Sexual activity: Not on file   Other Topics Concern  . Not on file   Social History Narrative   Single, lives alone. Retired Pensions consultant. Enjoys Agricultural consultant  work.    Family History  Problem Relation Age of Onset  . Breast cancer Mother   . Lung cancer Father   . Hypertension Brother   . Hypertension Other     Review of Systems:  As stated in the HPI and otherwise negative.   BP 130/80   Pulse 72   Ht 5\' 4"  (1.626 m)   Wt 97 lb 6.4 oz (44.2 kg)   BMI 16.72 kg/m   Physical Examination: General: Well developed, well nourished, NAD  HEENT: OP clear, mucus membranes moist  SKIN: warm, dry. No rashes. Neuro: No focal deficits  Musculoskeletal: Muscle strength 5/5 all ext  Psychiatric: Mood and affect normal  Neck: No JVD, no carotid bruits, no thyromegaly, no lymphadenopathy.  Lungs:Clear bilaterally, no wheezes, rhonci, crackles Cardiovascular: Regular rate and rhythm. No murmurs,  gallops or rubs. Abdomen:Soft. Bowel sounds present. Non-tender.  Extremities: No lower extremity edema. Pulses are 1+ in the right DP/PT and not palpable DP/PT.  EKG:  EKG is ordered today. The ekg ordered today demonstrates NSR, rate 72 bpm. Non-specific ST abnormality  Recent Labs: 04/21/2015: ALT 25; BUN 16; Creatinine, Ser 0.72; Hemoglobin 13.6; Platelets 370.0; Potassium 3.1; Sodium 131; TSH 2.46   Lipid Panel    Component Value Date/Time   CHOL 210 (H) 04/21/2015 1135   TRIG 94.0 04/21/2015 1135   HDL 83.10 04/21/2015 1135   CHOLHDL 3 04/21/2015 1135   VLDL 18.8 04/21/2015 1135   LDLCALC 108 (H) 04/21/2015 1135   LDLDIRECT 232.1 08/09/2009 1125     Wt Readings from Last 3 Encounters:  12/22/15 97 lb 6.4 oz (44.2 kg)  10/20/15 95 lb (43.1 kg)  04/21/15 93 lb 8 oz (42.4 kg)     Other studies Reviewed: Additional studies/ records that were reviewed today include: . Review of the above records demonstrates:    Assessment and Plan:   1. PAD:  Stable by dopplers May 2017. No rest pain or ulcerations. She does get claudication at 300 yards. Will continue ASA and Pletal. Continue statin. Repeat ABI May 2018.  2. Carotid artery disease: 40-59% LICA, 0-39% RICA stenosis by dopplers May 2017. Repeat May 2018.    3. Tobacco abuse: She is trying to stop. I have advised her to stop.   Current medicines are reviewed at length with the patient today.  The patient does not have concerns regarding medicines.  The following changes have been made:  no change  Labs/ tests ordered today include:   Orders Placed This Encounter  Procedures  . EKG 12-Lead    Disposition:   FU with me in 12  months  Signed, Verne Carrowhristopher McAlhany, MD 12/22/2015 3:43 PM    [facility] [address], [place], [place]  [address] Phone: [phone]; Fax: [phone]

## 2015-12-22 NOTE — Patient Instructions (Signed)
Medication Instructions:  Your physician recommends that you continue on your current medications as directed. Please refer to the Current Medication list given to you today.   Labwork: none  Testing/Procedures: Your physician has requested that you have an ankle brachial index (ABI). During this test an ultrasound and blood pressure cuff are used to evaluate the arteries that supply the arms and legs with blood. Allow thirty minutes for this exam. There are no restrictions or special instructions. To be done summer 2018  Your physician has requested that you have a carotid duplex. This test is an ultrasound of the carotid arteries in your neck. It looks at blood flow through these arteries that supply the brain with blood. Allow one hour for this exam. There are no restrictions or special instructions. To be done summer 2018    Follow-Up: Your physician wants you to follow-up in: 12 months.  You will receive a reminder letter in the mail two months in advance. If you don't receive a letter, please call our office to schedule the follow-up appointment.   Any Other Special Instructions Will Be Listed Below (If Applicable).     If you need a refill on your cardiac medications before your next appointment, please call your pharmacy.

## 2016-01-26 DIAGNOSIS — H5203 Hypermetropia, bilateral: Secondary | ICD-10-CM | POA: Diagnosis not present

## 2016-04-10 ENCOUNTER — Other Ambulatory Visit: Payer: Self-pay | Admitting: Internal Medicine

## 2016-04-10 NOTE — Telephone Encounter (Signed)
RX faxed to [facility] 

## 2016-04-19 ENCOUNTER — Encounter: Payer: Medicare Other | Admitting: Internal Medicine

## 2016-05-02 ENCOUNTER — Ambulatory Visit (INDEPENDENT_AMBULATORY_CARE_PROVIDER_SITE_OTHER): Payer: Medicare Other | Admitting: Internal Medicine

## 2016-05-02 ENCOUNTER — Encounter: Payer: Self-pay | Admitting: Internal Medicine

## 2016-05-02 VITALS — BP 148/74 | HR 98 | Temp 98.0°F | Resp 16 | Ht 64.0 in | Wt 94.5 lb

## 2016-05-02 DIAGNOSIS — I10 Essential (primary) hypertension: Secondary | ICD-10-CM | POA: Diagnosis not present

## 2016-05-02 DIAGNOSIS — M810 Age-related osteoporosis without current pathological fracture: Secondary | ICD-10-CM

## 2016-05-02 DIAGNOSIS — E785 Hyperlipidemia, unspecified: Secondary | ICD-10-CM

## 2016-05-02 DIAGNOSIS — Z0000 Encounter for general adult medical examination without abnormal findings: Secondary | ICD-10-CM

## 2016-05-02 DIAGNOSIS — F411 Generalized anxiety disorder: Secondary | ICD-10-CM | POA: Diagnosis not present

## 2016-05-02 NOTE — Progress Notes (Signed)
Subjective:    Patient ID: Jennifer [name], female    DOB: June 12, 1940, 76 y.o.   MRN: [id number]  HPI HPI Here for medicare wellness exam and an annual physical exam.   I have personally reviewed and have noted 1.The patient's medical and social history 2.Their use of alcohol, tobacco or illicit drugs 3.Their current medications and supplements 4.The patient's functional ability including ADL's, fall risks, home safety risks and hearing or visual impairment. 5.Diet and physical activities 6.Evidence for depression or mood disorders 7.Care team reviewed  - cardiology - Dr Clifton JamesMcAlhany   Are there smokers in your home (other than you)? No  Risk Factors Exercise: walks dog three times a day Dietary issues discussed: well balanced  Cardiac risk factors: advanced age, hypertension, hyperlipidemia  Depression Screen             Have you felt down, depressed or hopeless? No             Have you felt little interest or pleasure in doing things?  No  Activities of Daily Living In your present state of health, do you have any difficulty performing the following activities?:  Driving? No Managing money?  No Feeding yourself? No Getting from bed to chair? No Climbing a flight of stairs? No Preparing food and eating?: No Bathing or showering? No Getting dressed: No Getting to/using the toilet? No Moving around from place to place: No In the past year have you fallen or had a near fall?: No              Are you sexually active?  No             Do you have more than one partner?  N/A  Hearing Difficulties: No Do you often ask people to speak up or repeat themselves? No Do you experience ringing or noises in your ears? No Do you have difficulty understanding soft or whispered voices? No Vision:              Any change in vision: no              Up to date with eye exam:   Yes  Memory:             Do you  feel that you have a problem with memory? No             Do you often misplace items? No             Do you feel safe at home?  Yes  Cognitive Testing             Alert, Orientated? Yes  Normal Appearance? Yes             Recall of three objects?  Yes             Can perform simple calculations? Yes             Displays appropriate judgment? Yes             Can read the correct time from a watch face? Yes              Advanced Directives have been discussed with the patient? Yes - in place   Medications and allergies reviewed with patient and updated if appropriate.  Patient Active Problem List   Diagnosis Date Noted  . Carotid arterial disease (HCC) 10/20/2015  . Anxiety state   . PAD (peripheral artery  disease) (HCC) 10/10/2011  . Osteoporosis, senile 05/17/2011  . Tachycardia 08/15/2010  . Dyslipidemia 05/03/2009  . SMOKER 04/14/2009  . Essential hypertension 04/14/2009    Current Outpatient Prescriptions on File Prior to Visit  Medication Sig Dispense Refill  . ALPRAZolam (XANAX) 0.25 MG tablet TAKE 1 TABLET BY MOUTH AT BEDTIME AS NEEDED FOR ANXIETY 30 tablet 0  . amLODipine (NORVASC) 5 MG tablet Take 1 tablet (5 mg total) by mouth at bedtime. 90 tablet 3  . aspirin 81 MG tablet Take 81 mg by mouth daily before breakfast.     . atorvastatin (LIPITOR) 20 MG tablet Take 1 tablet (20 mg total) by mouth daily. 90 tablet 3  . calcium-vitamin D (OSCAL WITH D) 500-200 MG-UNIT per tablet Take 2 tablets by mouth daily.    . cilostazol (PLETAL) 50 MG tablet Take 1 tablet (50 mg total) by mouth 2 (two) times daily. 180 tablet 3  . losartan-hydrochlorothiazide (HYZAAR) 50-12.5 MG tablet Take 1 tablet by mouth daily. 90 tablet 3  . PARoxetine (PAXIL) 10 MG tablet Take 1 tablet (10 mg total) by mouth daily. 90 tablet 3   No current facility-administered medications on file prior to visit.     Past Medical History:  Diagnosis Date  . ANEMIA-NOS   . DYSLIPIDEMIA   . Hemorrhoids     . HYPERTENSION   . Osteoporosis, senile    DEXA 03/2014: -3.3 R fem  . Peripheral vascular disease, unspecified dx 02/2010 ABI  . SMOKER     Past Surgical History:  Procedure Laterality Date  . CATARACT EXTRACTION W/ INTRAOCULAR LENS IMPLANT Right 03/12/14   groat  . HEMORRHOID SURGERY  05/18/2011   Procedure: HEMORRHOIDECTOMY;  Surgeon: Robyne Askew, MD;  Location: [facility];  Service: General;  Laterality: N/A;  hemorrhoidectomy, three columns, internal and external  . TONSILLECTOMY  1946  . TUBAL LIGATION  1979    Social History   Social History  . Marital status: Single    Spouse name: N/A  . Number of children: N/A  . Years of education: N/A   Social History Main Topics  . Smoking status: Current Some Day Smoker    Packs/day: 0.50    Years: 50.00    Types: Cigarettes  . Smokeless tobacco: Never Used  . Alcohol use 0.0 oz/week     Comment: Social  . Drug use: No  . Sexual activity: Not Asked   Other Topics Concern  . None   Social History Narrative   Single, lives alone. Retired Pensions consultant. Enjoys Agricultural consultant work.    Family History  Problem Relation Age of Onset  . Breast cancer Mother   . Lung cancer Father   . Hypertension Brother   . Hypertension Other     Review of Systems  Constitutional: Negative for appetite change, chills, fatigue and fever.  HENT: Negative for hearing loss and tinnitus.   Eyes: Negative for visual disturbance.  Respiratory: Negative for cough, shortness of breath and wheezing.   Cardiovascular: Negative for chest pain, palpitations and leg swelling.  Gastrointestinal: Negative for abdominal pain, blood in stool, constipation, diarrhea and nausea.  Genitourinary: Negative for dysuria and hematuria.  Musculoskeletal: Positive for back pain (lower back pain occ). Negative for arthralgias.  Skin: Negative for color change and rash.  Neurological: Negative for light-headedness and headaches.  Psychiatric/Behavioral:  Negative for dysphoric mood. The patient is not nervous/anxious.        Objective:   Vitals:   05/02/16 1355  BP: (!) 148/74  Pulse: 98  Resp: 16  Temp: 98 F (36.7 C)   Filed Weights   05/02/16 1355  Weight: 94 lb 8 oz (42.9 kg)   Body mass index is 16.22 kg/m.  Wt Readings from Last 3 Encounters:  05/02/16 94 lb 8 oz (42.9 kg)  12/22/15 97 lb 6.4 oz (44.2 kg)  10/20/15 95 lb (43.1 kg)     Physical Exam Constitutional: She appears well-developed and well-nourished. No distress.  HENT:  Head: Normocephalic and atraumatic.  Right Ear: External ear normal. Normal ear canal and TM Left Ear: External ear normal.  Normal ear canal and TM Mouth/Throat: Oropharynx is clear and moist.  Eyes: Conjunctivae and EOM are normal.  Neck: Neck supple. No tracheal deviation present. No thyromegaly present.  No carotid bruit  Cardiovascular: Normal rate, regular rhythm and normal heart sounds.   No murmur heard.  No edema. Pulmonary/Chest: Effort normal and breath sounds normal. No respiratory distress. She has no wheezes. She has no rales.  Breast: deferred Abdominal: Soft. She exhibits no distension. There is no tenderness.  Lymphadenopathy: She has no cervical adenopathy.  Skin: Skin is warm and dry. She is not diaphoretic.  Psychiatric: She has a normal mood and affect. Her behavior is normal.         Assessment & Plan:   Wellness Exam: Immunizations  Discussed shingles vaccines, flu vaccines Colonoscopy  - aged out Mammogram - aged out Dexa - due    Eye exam  Up to date  Hearing loss  none Memory concerns/difficulties  none Independent of ADLs  fully Stressed the importance of regular exercise   Patient received copy of preventative screening tests/immunizations recommended for the next 5-10 years.  Physical exam: Screening blood work ordered Immunizations  Discussed shingles vaccines, flu vaccines Colonoscopy  - aged out Mammogram - aged out Dexa - due    Eye exams   Up to date  Exercise  - regular - walking Weight - weight has been stable - on low side - ideally should gain some weight - avoid any decrease in weight Skin  - no concerns Substance abuse - currently smokes - advised cessation  See Problem List for Assessment and Plan of chronic medical problems.   FU in 6 months

## 2016-05-02 NOTE — Patient Instructions (Addendum)
Jennifer [name] , Thank you for taking time to come for your Medicare Wellness Visit. I appreciate your ongoing commitment to your health goals. Please review the following plan we discussed and let me know if I can assist you in the future.   These are the goals we discussed: Goals    Work on cutting back on smoking      This is a list of the screening recommended for you and due dates:  Health Maintenance  Topic Date Due  . Shingles Vaccine  08/17/1998  . DEXA scan (bone density measurement)  04/06/2016  . Tetanus Vaccine  07/22/2023  . Flu Shot  Completed  . Pneumonia vaccines  Completed     Test(s) ordered today. Your results will be released to [place] (or called to you) after review, usually within 72hours after test completion. If any changes need to be made, you will be notified at that same time.  All other Health Maintenance issues reviewed.   All recommended immunizations and age-appropriate screenings are up-to-date or discussed.  No immunizations administered today.   Medications reviewed and updated.  No changes recommended at this time.    Please followup in 6 months   Health Maintenance, Female Introduction Adopting a healthy lifestyle and getting preventive care can go a long way to promote health and wellness. Talk with your health care provider about what schedule of regular examinations is right for you. This is a good chance for you to check in with your provider about disease prevention and staying healthy. In between checkups, there are plenty of things you can do on your own. Experts have done a lot of research about which lifestyle changes and preventive measures are most likely to keep you healthy. Ask your health care provider for more information. Weight and diet Eat a healthy diet  Be sure to include plenty of vegetables, fruits, low-fat dairy products, and lean protein.  Do not eat a lot of foods high in solid fats, added sugars, or salt.  Get  regular exercise. This is one of the most important things you can do for your health.  Most adults should exercise for at least 150 minutes each week. The exercise should increase your heart rate and make you sweat (moderate-intensity exercise).  Most adults should also do strengthening exercises at least twice a week. This is in addition to the moderate-intensity exercise. Maintain a healthy weight  Body mass index (BMI) is a measurement that can be used to identify possible weight problems. It estimates body fat based on height and weight. Your health care provider can help determine your BMI and help you achieve or maintain a healthy weight.  For females 48 years of age and older:  A BMI below 18.5 is considered underweight.  A BMI of 18.5 to 24.9 is normal.  A BMI of 25 to 29.9 is considered overweight.  A BMI of 30 and above is considered obese. Watch levels of cholesterol and blood lipids  You should start having your blood tested for lipids and cholesterol at 78 years of age, then have this test every 5 years.  You may need to have your cholesterol levels checked more often if:  Your lipid or cholesterol levels are high.  You are older than 78 years of age.  You are at high risk for heart disease. Cancer screening Lung Cancer  Lung cancer screening is recommended for adults 57-44 years old who are at high risk for lung cancer because of a  history of smoking.  A yearly low-dose CT scan of the lungs is recommended for people who:  Currently smoke.  Have quit within the past 15 years.  Have at least a 30-pack-year history of smoking. A pack year is smoking an average of one pack of cigarettes a day for 1 year.  Yearly screening should continue until it has been 15 years since you quit.  Yearly screening should stop if you develop a health problem that would prevent you from having lung cancer treatment. Breast Cancer  Practice breast self-awareness. This means  understanding how your breasts normally appear and feel.  It also means doing regular breast self-exams. Let your health care provider know about any changes, no matter how small.  If you are in your 20s or 30s, you should have a clinical breast exam (CBE) by a health care provider every 1-3 years as part of a regular health exam.  If you are 71 or older, have a CBE every year. Also consider having a breast X-ray (mammogram) every year.  If you have a family history of breast cancer, talk to your health care provider about genetic screening.  If you are at high risk for breast cancer, talk to your health care provider about having an MRI and a mammogram every year.  Breast cancer gene (BRCA) assessment is recommended for women who have family members with BRCA-related cancers. BRCA-related cancers include:  Breast.  Ovarian.  Tubal.  Peritoneal cancers.  Results of the assessment will determine the need for genetic counseling and BRCA1 and BRCA2 testing. Cervical Cancer  Your health care provider may recommend that you be screened regularly for cancer of the pelvic organs (ovaries, uterus, and vagina). This screening involves a pelvic examination, including checking for microscopic changes to the surface of your cervix (Pap test). You may be encouraged to have this screening done every 3 years, beginning at age 50.  For women ages 21-65, health care providers may recommend pelvic exams and Pap testing every 3 years, or they may recommend the Pap and pelvic exam, combined with testing for human papilloma virus (HPV), every 5 years. Some types of HPV increase your risk of cervical cancer. Testing for HPV may also be done on women of any age with unclear Pap test results.  Other health care providers may not recommend any screening for nonpregnant women who are considered low risk for pelvic cancer and who do not have symptoms. Ask your health care provider if a screening pelvic exam is  right for you.  If you have had past treatment for cervical cancer or a condition that could lead to cancer, you need Pap tests and screening for cancer for at least 20 years after your treatment. If Pap tests have been discontinued, your risk factors (such as having a new sexual partner) need to be reassessed to determine if screening should resume. Some women have medical problems that increase the chance of getting cervical cancer. In these cases, your health care provider may recommend more frequent screening and Pap tests. Colorectal Cancer  This type of cancer can be detected and often prevented.  Routine colorectal cancer screening usually begins at 78 years of age and continues through 78 years of age.  Your health care provider may recommend screening at an earlier age if you have risk factors for colon cancer.  Your health care provider may also recommend using home test kits to check for hidden blood in the stool.  A small camera at  the end of a tube can be used to examine your colon directly (sigmoidoscopy or colonoscopy). This is done to check for the earliest forms of colorectal cancer.  Routine screening usually begins at age 69.  Direct examination of the colon should be repeated every 5-10 years through 78 years of age. However, you may need to be screened more often if early forms of precancerous polyps or small growths are found. Skin Cancer  Check your skin from head to toe regularly.  Tell your health care provider about any new moles or changes in moles, especially if there is a change in a mole's shape or color.  Also tell your health care provider if you have a mole that is larger than the size of a pencil eraser.  Always use sunscreen. Apply sunscreen liberally and repeatedly throughout the day.  Protect yourself by wearing long sleeves, pants, a wide-brimmed hat, and sunglasses whenever you are outside. Heart disease, diabetes, and high blood pressure  High  blood pressure causes heart disease and increases the risk of stroke. High blood pressure is more likely to develop in:  People who have blood pressure in the high end of the normal range (130-139/85-89 mm Hg).  People who are overweight or obese.  People who are African American.  If you are 16-79 years of age, have your blood pressure checked every 3-5 years. If you are 56 years of age or older, have your blood pressure checked every year. You should have your blood pressure measured twice-once when you are at a hospital or clinic, and once when you are not at a hospital or clinic. Record the average of the two measurements. To check your blood pressure when you are not at a hospital or clinic, you can use:  An automated blood pressure machine at a pharmacy.  A home blood pressure monitor.  If you are between 5 years and 54 years old, ask your health care provider if you should take aspirin to prevent strokes.  Have regular diabetes screenings. This involves taking a blood sample to check your fasting blood sugar level.  If you are at a normal weight and have a low risk for diabetes, have this test once every three years after 78 years of age.  If you are overweight and have a high risk for diabetes, consider being tested at a younger age or more often. Preventing infection Hepatitis B  If you have a higher risk for hepatitis B, you should be screened for this virus. You are considered at high risk for hepatitis B if:  You were born in a country where hepatitis B is common. Ask your health care provider which countries are considered high risk.  Your parents were born in a high-risk country, and you have not been immunized against hepatitis B (hepatitis B vaccine).  You have HIV or AIDS.  You use needles to inject street drugs.  You live with someone who has hepatitis B.  You have had sex with someone who has hepatitis B.  You get hemodialysis treatment.  You take certain  medicines for conditions, including cancer, organ transplantation, and autoimmune conditions. Hepatitis C  Blood testing is recommended for:  Everyone born from [birth year] through 1967.  Anyone with known risk factors for hepatitis C. Sexually transmitted infections (STIs)  You should be screened for sexually transmitted infections (STIs) including gonorrhea and chlamydia if:  You are sexually active and are younger than 78 years of age.  You are older than  78 years of age and your health care provider tells you that you are at risk for this type of infection.  Your sexual activity has changed since you were last screened and you are at an increased risk for chlamydia or gonorrhea. Ask your health care provider if you are at risk.  If you do not have HIV, but are at risk, it may be recommended that you take a prescription medicine daily to prevent HIV infection. This is called pre-exposure prophylaxis (PrEP). You are considered at risk if:  You are sexually active and do not regularly use condoms or know the HIV status of your partner(s).  You take drugs by injection.  You are sexually active with a partner who has HIV. Talk with your health care provider about whether you are at high risk of being infected with HIV. If you choose to begin PrEP, you should first be tested for HIV. You should then be tested every 3 months for as long as you are taking PrEP. Pregnancy  If you are premenopausal and you may become pregnant, ask your health care provider about preconception counseling.  If you may become pregnant, take 400 to 800 micrograms (mcg) of folic acid every day.  If you want to prevent pregnancy, talk to your health care provider about birth control (contraception). Osteoporosis and menopause  Osteoporosis is a disease in which the bones lose minerals and strength with aging. This can result in serious bone fractures. Your risk for osteoporosis can be identified using a bone density  scan.  If you are 43 years of age or older, or if you are at risk for osteoporosis and fractures, ask your health care provider if you should be screened.  Ask your health care provider whether you should take a calcium or vitamin D supplement to lower your risk for osteoporosis.  Menopause may have certain physical symptoms and risks.  Hormone replacement therapy may reduce some of these symptoms and risks. Talk to your health care provider about whether hormone replacement therapy is right for you. Follow these instructions at home:  Schedule regular health, dental, and eye exams.  Stay current with your immunizations.  Do not use any tobacco products including cigarettes, chewing tobacco, or electronic cigarettes.  If you are pregnant, do not drink alcohol.  If you are breastfeeding, limit how much and how often you drink alcohol.  Limit alcohol intake to no more than 1 drink per day for nonpregnant women. One drink equals 12 ounces of beer, 5 ounces of wine, or 1 ounces of hard liquor.  Do not use street drugs.  Do not share needles.  Ask your health care provider for help if you need support or information about quitting drugs.  Tell your health care provider if you often feel depressed.  Tell your health care provider if you have ever been abused or do not feel safe at home. This information is not intended to replace advice given to you by your health care provider. Make sure you discuss any questions you have with your health care provider. Document Released: 10/24/2010 Document Revised: 09/16/2015 Document Reviewed: 01/12/2015  2017 Elsevier

## 2016-05-02 NOTE — Assessment & Plan Note (Signed)
BP at home well controlled - 122/84 at home today Current regimen effective and well tolerated Continue current medications at current doses

## 2016-05-02 NOTE — Assessment & Plan Note (Signed)
Check lipid panel  Continue daily statin Regular exercise and healthy diet encouraged  

## 2016-05-02 NOTE — Progress Notes (Signed)
Pre visit review using our clinic review tool, if applicable. No additional management support is needed unless otherwise documented below in the visit note. 

## 2016-05-02 NOTE — Assessment & Plan Note (Signed)
Controlled, stable Takes xanax only as needed takes paxil daily Continue current dose of medication

## 2016-05-02 NOTE — Assessment & Plan Note (Addendum)
Did not tolerate fosamax Taking calcium and vitamin D dexa ordered Would prefer to avoid medication if possible Consider reclast or prolia Avoid falls Walks dog 3/day

## 2016-05-05 ENCOUNTER — Telehealth: Payer: Self-pay

## 2016-05-05 NOTE — Telephone Encounter (Signed)
Pt lvmm and is rq assistant to call. Pt did not leave any details.

## 2016-05-05 NOTE — Telephone Encounter (Signed)
SPoke with pt to clarify that labs are to be fasting.

## 2016-05-08 ENCOUNTER — Encounter: Payer: Self-pay | Admitting: Internal Medicine

## 2016-05-08 ENCOUNTER — Other Ambulatory Visit (INDEPENDENT_AMBULATORY_CARE_PROVIDER_SITE_OTHER): Payer: Medicare Other

## 2016-05-08 DIAGNOSIS — Z0000 Encounter for general adult medical examination without abnormal findings: Secondary | ICD-10-CM | POA: Diagnosis not present

## 2016-05-08 DIAGNOSIS — M810 Age-related osteoporosis without current pathological fracture: Secondary | ICD-10-CM

## 2016-05-08 DIAGNOSIS — I10 Essential (primary) hypertension: Secondary | ICD-10-CM | POA: Diagnosis not present

## 2016-05-08 LAB — CBC WITH DIFFERENTIAL/PLATELET
Basophils Absolute: 0.1 10*3/uL (ref 0.0–0.1)
Basophils Relative: 0.8 % (ref 0.0–3.0)
EOS ABS: 0.1 10*3/uL (ref 0.0–0.7)
EOS PCT: 1.2 % (ref 0.0–5.0)
HCT: 39.1 % (ref 36.0–46.0)
HEMOGLOBIN: 13.4 g/dL (ref 12.0–15.0)
Lymphocytes Relative: 36 % (ref 12.0–46.0)
Lymphs Abs: 4.2 10*3/uL — ABNORMAL HIGH (ref 0.7–4.0)
MCHC: 34.3 g/dL (ref 30.0–36.0)
MCV: 89.1 fl (ref 78.0–100.0)
MONO ABS: 1.1 10*3/uL — AB (ref 0.1–1.0)
Monocytes Relative: 9.5 % (ref 3.0–12.0)
Neutro Abs: 6.1 10*3/uL (ref 1.4–7.7)
Neutrophils Relative %: 52.5 % (ref 43.0–77.0)
Platelets: 336 10*3/uL (ref 150.0–400.0)
RBC: 4.39 Mil/uL (ref 3.87–5.11)
RDW: 14.3 % (ref 11.5–15.5)
WBC: 11.6 10*3/uL — AB (ref 4.0–10.5)

## 2016-05-08 LAB — LIPID PANEL
CHOLESTEROL: 196 mg/dL (ref 0–200)
HDL: 81.6 mg/dL (ref >39.00)
LDL Cholesterol: 97 mg/dL (ref 0–99)
NonHDL: 113.92
Total CHOL/HDL Ratio: 2
Triglycerides: 87 mg/dL (ref 0.0–149.0)
VLDL: 17.4 mg/dL (ref 0.0–40.0)

## 2016-05-08 LAB — COMPREHENSIVE METABOLIC PANEL
ALBUMIN: 4 g/dL (ref 3.5–5.2)
ALK PHOS: 46 U/L (ref 39–117)
ALT: 15 U/L (ref 0–35)
AST: 24 U/L (ref 0–37)
BILIRUBIN TOTAL: 0.6 mg/dL (ref 0.2–1.2)
BUN: 14 mg/dL (ref 6–23)
CO2: 30 mEq/L (ref 19–32)
CREATININE: 0.74 mg/dL (ref 0.40–1.20)
Calcium: 9.7 mg/dL (ref 8.4–10.5)
Chloride: 93 mEq/L — ABNORMAL LOW (ref 96–112)
GFR: 80.73 mL/min (ref >60.00)
Glucose, Bld: 116 mg/dL — ABNORMAL HIGH (ref 70–99)
Potassium: 3.3 mEq/L — ABNORMAL LOW (ref 3.5–5.1)
SODIUM: 132 meq/L — AB (ref 135–145)
TOTAL PROTEIN: 7.9 g/dL (ref 6.0–8.3)

## 2016-05-08 LAB — VITAMIN D 25 HYDROXY (VIT D DEFICIENCY, FRACTURES): VITD: 32.89 ng/mL (ref 30.00–100.00)

## 2016-05-08 LAB — TSH: TSH: 2.86 u[IU]/mL (ref 0.35–4.50)

## 2016-05-25 ENCOUNTER — Ambulatory Visit (INDEPENDENT_AMBULATORY_CARE_PROVIDER_SITE_OTHER)
Admission: RE | Admit: 2016-05-25 | Discharge: 2016-05-25 | Disposition: A | Discharge status: Home / Self Care | Payer: Medicare Other | Source: Ambulatory Visit

## 2016-05-25 DIAGNOSIS — M810 Age-related osteoporosis without current pathological fracture: Secondary | ICD-10-CM

## 2016-05-27 ENCOUNTER — Encounter: Payer: Self-pay | Admitting: Internal Medicine

## 2016-05-31 ENCOUNTER — Telehealth: Payer: Self-pay | Admitting: Internal Medicine

## 2016-05-31 NOTE — Telephone Encounter (Signed)
She is interested in prolia.  She did not tolerated fosamax in the past.

## 2016-06-01 NOTE — Telephone Encounter (Signed)
Will you please verify pt's coverage for Prolia. Thanks!

## 2016-06-01 NOTE — Telephone Encounter (Signed)
Jennifer [name] will be submitting information into insurance company for summary of benefits, I will call patient back after I receive findings to discuss cost/copay

## 2016-06-05 ENCOUNTER — Other Ambulatory Visit: Payer: Self-pay | Admitting: Internal Medicine

## 2016-06-05 NOTE — Telephone Encounter (Signed)
RX faxed to [facility] 

## 2016-06-12 ENCOUNTER — Other Ambulatory Visit: Payer: Self-pay | Admitting: Internal Medicine

## 2016-07-05 ENCOUNTER — Telehealth: Payer: Self-pay

## 2016-07-05 NOTE — Telephone Encounter (Signed)
Patient advised that prolia has been approved by her insurance company with estimated copay of $230, patient will call back to schedule nurse visit---can talk with tamara if any further questions

## 2016-07-10 ENCOUNTER — Ambulatory Visit (INDEPENDENT_AMBULATORY_CARE_PROVIDER_SITE_OTHER): Payer: Medicare Other

## 2016-07-10 DIAGNOSIS — M810 Age-related osteoporosis without current pathological fracture: Secondary | ICD-10-CM | POA: Diagnosis not present

## 2016-07-10 MED ORDER — DENOSUMAB 60 MG/ML ~~LOC~~ SOLN
60.0000 mg | Freq: Once | SUBCUTANEOUS | Status: AC
  Administered 2016-07-10: 60 mg via SUBCUTANEOUS

## 2016-07-10 NOTE — Progress Notes (Signed)
prolia Injection given.   Jennifer [name] Jennifer Seanmichael Salmons, MD  

## 2016-07-11 NOTE — Telephone Encounter (Signed)
prolia injection given 3/19

## 2016-08-01 ENCOUNTER — Other Ambulatory Visit: Payer: Self-pay | Admitting: Internal Medicine

## 2016-09-12 ENCOUNTER — Other Ambulatory Visit: Payer: Self-pay | Admitting: Cardiovascular Disease

## 2016-09-12 DIAGNOSIS — I739 Peripheral vascular disease, unspecified: Secondary | ICD-10-CM

## 2016-09-25 ENCOUNTER — Ambulatory Visit (HOSPITAL_COMMUNITY)
Admission: RE | Admit: 2016-09-25 | Discharge: 2016-09-25 | Disposition: A | Discharge status: Home / Self Care | Payer: Medicare Other | Source: Ambulatory Visit | Attending: Cardiovascular Disease | Admitting: Cardiovascular Disease

## 2016-09-25 ENCOUNTER — Other Ambulatory Visit: Payer: Self-pay | Admitting: Internal Medicine

## 2016-09-25 DIAGNOSIS — I6523 Occlusion and stenosis of bilateral carotid arteries: Secondary | ICD-10-CM

## 2016-09-25 DIAGNOSIS — I739 Peripheral vascular disease, unspecified: Secondary | ICD-10-CM

## 2016-09-25 NOTE — Telephone Encounter (Signed)
RX faxed to [facility] 

## 2016-10-07 DIAGNOSIS — S61402A Unspecified open wound of left hand, initial encounter: Secondary | ICD-10-CM | POA: Diagnosis not present

## 2016-10-07 DIAGNOSIS — W540XXA Bitten by dog, initial encounter: Secondary | ICD-10-CM | POA: Diagnosis not present

## 2016-10-09 ENCOUNTER — Encounter: Payer: Self-pay | Admitting: Internal Medicine

## 2016-10-09 ENCOUNTER — Ambulatory Visit (INDEPENDENT_AMBULATORY_CARE_PROVIDER_SITE_OTHER): Payer: Medicare Other | Admitting: Internal Medicine

## 2016-10-09 VITALS — BP 122/70 | HR 108 | Temp 98.8°F | Resp 16 | Wt 96.0 lb

## 2016-10-09 DIAGNOSIS — W540XXD Bitten by dog, subsequent encounter: Secondary | ICD-10-CM

## 2016-10-09 DIAGNOSIS — S61452D Open bite of left hand, subsequent encounter: Secondary | ICD-10-CM

## 2016-10-09 DIAGNOSIS — S61452A Open bite of left hand, initial encounter: Secondary | ICD-10-CM | POA: Insufficient documentation

## 2016-10-09 DIAGNOSIS — W540XXA Bitten by dog, initial encounter: Secondary | ICD-10-CM

## 2016-10-09 MED ORDER — CIPROFLOXACIN HCL 500 MG PO TABS: 500.0000 mg | ORAL_TABLET | Freq: Two times a day (BID) | ORAL | 0 refills | Status: DC

## 2016-10-09 MED ORDER — CLINDAMYCIN HCL 300 MG PO CAPS: 300.0000 mg | ORAL_CAPSULE | Freq: Three times a day (TID) | ORAL | 0 refills | Status: DC

## 2016-10-09 NOTE — Assessment & Plan Note (Signed)
No evidence of infection , but only completed 2 days of antibiotics and at risk for infection  Stop Augmentin secondary to diarrhea  Start clindamycin plus Cipro for an additional 5 days  Monitor closely for infection Local wound care  Tetanus up-to-date -booster not needed   Call with questions or concerns

## 2016-10-09 NOTE — Progress Notes (Signed)
Subjective:    Patient ID: Jennifer BackersSusan L Pellot, female    DOB: 1940/08/11, 76 y.o.   MRN: [id number]  HPI She is here for follow up from urgent care for a dog bite.   Three nights ago she was bit by her dog.  She pet him and he bit her on her left hand for no reason.   She did review the dog , but he has not been previously.  The dog is up to date on all his vaccines.   She did go to urgent care.  She had the wound cleaned out thoroughly.   She is up-to-date with tetanus and did not receive a booster She was placed on Augmentin.  She did not need any sutures.  She denies pain.  There is still some swelling.  She denies numbness and tingling in her hand. She has full range of movement of your fingers.  Her grip is a little less but that is related to the swelling.   She has been experiencing diarrhea with the antibiotic and did not take it today. She is concerned about getting an infection if she is of the antibiotics. She denies any fevers or chills.  Medications and allergies reviewed with patient and updated if appropriate.  Patient Active Problem List   Diagnosis Date Noted  . Carotid arterial disease (HCC) 10/20/2015  . Anxiety state   . PAD (peripheral artery disease) (HCC) 10/10/2011  . Osteoporosis, senile 05/17/2011  . Tachycardia 08/15/2010  . Dyslipidemia 05/03/2009  . SMOKER 04/14/2009  . Essential hypertension 04/14/2009    Current Outpatient Prescriptions on File Prior to Visit  Medication Sig Dispense Refill  . ALPRAZolam (XANAX) 0.25 MG tablet TAKE 1 TABLET BY MOUTH AT BEDTIME AS NEEDED FOR ANXIETY 30 tablet 0  . amLODipine (NORVASC) 5 MG tablet Take 1 tablet (5 mg total) by mouth at bedtime. 90 tablet 3  . amLODipine (NORVASC) 5 MG tablet TAKE 1 TABLET BY MOUTH EVERY NIGHT AT BEDTIME 90 tablet 2  . aspirin 81 MG tablet Take 81 mg by mouth daily before breakfast.     . atorvastatin (LIPITOR) 20 MG tablet Take 1 tablet (20 mg total) by mouth daily. 90 tablet 3  .  calcium-vitamin D (OSCAL WITH D) 500-200 MG-UNIT per tablet Take 2 tablets by mouth daily.    . cilostazol (PLETAL) 50 MG tablet Take 1 tablet (50 mg total) by mouth 2 (two) times daily. 180 tablet 3  . losartan-hydrochlorothiazide (HYZAAR) 50-12.5 MG tablet Take 1 tablet by mouth daily. 90 tablet 3  . PARoxetine (PAXIL) 10 MG tablet Take 1 tablet (10 mg total) by mouth daily. 90 tablet 3   No current facility-administered medications on file prior to visit.     Past Medical History:  Diagnosis Date  . ANEMIA-NOS   . DYSLIPIDEMIA   . Hemorrhoids   . HYPERTENSION   . Osteoporosis, senile    DEXA 03/2014: -3.3 R fem  . Peripheral vascular disease, unspecified dx 02/2010 ABI  . SMOKER     Past Surgical History:  Procedure Laterality Date  . CATARACT EXTRACTION W/ INTRAOCULAR LENS IMPLANT Right 03/12/14   groat  . HEMORRHOID SURGERY  05/18/2011   Procedure: HEMORRHOIDECTOMY;  Surgeon: Robyne AskewPaul S Toth III, MD;  Location: [facility];  Service: General;  Laterality: N/A;  hemorrhoidectomy, three columns, internal and external  . TONSILLECTOMY  1946  . TUBAL LIGATION  1979    Social History   Social History  .  Marital status: Single    Spouse name: N/A  . Number of children: N/A  . Years of education: N/A   Social History Main Topics  . Smoking status: Current Some Day Smoker    Packs/day: 0.50    Years: 50.00    Types: Cigarettes  . Smokeless tobacco: Never Used  . Alcohol use 0.0 oz/week     Comment: Social  . Drug use: No  . Sexual activity: Not on file   Other Topics Concern  . Not on file   Social History Narrative   Single, lives alone. Retired Pensions consultant. Enjoys Agricultural consultant work.    Family History  Problem Relation Age of Onset  . Breast cancer Mother   . Lung cancer Father   . Hypertension Brother   . Hypertension Other     Review of Systems  Constitutional: Negative for chills and fever.  Skin: Positive for wound (no discharge). Negative for color change  and rash.  Neurological: Negative for weakness and numbness.       Objective:   Vitals:   10/09/16 1536  BP: 122/70  Pulse: (!) 108  Resp: 16  Temp: 98.8 F (37.1 C)   Filed Weights   10/09/16 1536  Weight: 96 lb (43.5 kg)   Body mass index is 16.48 kg/m.  Wt Readings from Last 3 Encounters:  10/09/16 96 lb (43.5 kg)  05/02/16 94 lb 8 oz (42.9 kg)  12/22/15 97 lb 6.4 oz (44.2 kg)     Physical Exam  Constitutional: She appears well-developed and well-nourished. No distress.  Neurological:  Normal sensation left hand  Skin: Skin is warm and dry. She is not diaphoretic.  V shaped laceration lateral distal palm, healing well - closed, no discharge, no surrounding redness or swelling          Assessment & Plan:   See Problem List for Assessment and Plan of chronic medical problems.

## 2016-10-09 NOTE — Patient Instructions (Signed)
  Medications reviewed and updated.  Changes include stopping augmentin and starting clindamycin and cipro.    Your prescription(s) have been submitted to your pharmacy. Please take as directed and contact our office if you believe you are having problem(s) with the medication(s).   Call with any questions or concerns.

## 2016-10-11 ENCOUNTER — Other Ambulatory Visit: Payer: Self-pay | Admitting: Internal Medicine

## 2016-10-19 ENCOUNTER — Other Ambulatory Visit: Payer: Self-pay | Admitting: Internal Medicine

## 2016-11-01 ENCOUNTER — Ambulatory Visit (INDEPENDENT_AMBULATORY_CARE_PROVIDER_SITE_OTHER): Payer: Medicare Other | Admitting: Internal Medicine

## 2016-11-01 ENCOUNTER — Encounter: Payer: Self-pay | Admitting: Internal Medicine

## 2016-11-01 ENCOUNTER — Other Ambulatory Visit (INDEPENDENT_AMBULATORY_CARE_PROVIDER_SITE_OTHER): Payer: Medicare Other

## 2016-11-01 VITALS — BP 120/78 | HR 89 | Temp 97.9°F | Resp 16 | Wt 97.5 lb

## 2016-11-01 DIAGNOSIS — I10 Essential (primary) hypertension: Secondary | ICD-10-CM | POA: Diagnosis not present

## 2016-11-01 DIAGNOSIS — F17200 Nicotine dependence, unspecified, uncomplicated: Secondary | ICD-10-CM

## 2016-11-01 DIAGNOSIS — R739 Hyperglycemia, unspecified: Secondary | ICD-10-CM | POA: Diagnosis not present

## 2016-11-01 DIAGNOSIS — F411 Generalized anxiety disorder: Secondary | ICD-10-CM

## 2016-11-01 DIAGNOSIS — E785 Hyperlipidemia, unspecified: Secondary | ICD-10-CM | POA: Diagnosis not present

## 2016-11-01 DIAGNOSIS — M810 Age-related osteoporosis without current pathological fracture: Secondary | ICD-10-CM | POA: Diagnosis not present

## 2016-11-01 LAB — COMPREHENSIVE METABOLIC PANEL
ALBUMIN: 4 g/dL (ref 3.5–5.2)
ALT: 19 U/L (ref 0–35)
AST: 27 U/L (ref 0–37)
Alkaline Phosphatase: 44 U/L (ref 39–117)
BUN: 14 mg/dL (ref 6–23)
CALCIUM: 9.1 mg/dL (ref 8.4–10.5)
CHLORIDE: 93 meq/L — AB (ref 96–112)
CO2: 30 meq/L (ref 19–32)
Creatinine, Ser: 0.74 mg/dL (ref 0.40–1.20)
GFR: 80.63 mL/min (ref >60.00)
Glucose, Bld: 98 mg/dL (ref 70–99)
Potassium: 3.1 mEq/L — ABNORMAL LOW (ref 3.5–5.1)
Sodium: 131 mEq/L — ABNORMAL LOW (ref 135–145)
Total Bilirubin: 0.4 mg/dL (ref 0.2–1.2)
Total Protein: 7.9 g/dL (ref 6.0–8.3)

## 2016-11-01 LAB — HEMOGLOBIN A1C: HEMOGLOBIN A1C: 5.8 % (ref 4.6–6.5)

## 2016-11-01 NOTE — Progress Notes (Signed)
Subjective:    Patient ID: Jennifer [name], female    DOB: 1940/06/22, 76 y.o.   MRN: [id number]  HPI The patient is here for follow up.  Hypertension: She is taking her medication daily. She is compliant with a low sodium diet.  She denies chest pain, palpitations, edema, shortness of breath and regular headaches. She is exercising regularly - walking the dog.      Hyperlipidemia: She is taking her medication daily. She is compliant with a low fat/cholesterol diet. She is exercising regularly. She denies myalgias.   Anxiety: She is taking her medication daily as prescribed. She denies any side effects from the medication. She feels her anxiety is well controlled and she is happy with her current dose of medication.   Osteoporosis:  She is taking calcium and vitamin d daily.  She walks her dog for exercise. She had the first prolia injection and did well with it.   Smoking: She is uses menthol cough drops.  She is trying to cut down.   Medications and allergies reviewed with patient and updated if appropriate.  Patient Active Problem List   Diagnosis Date Noted  . Dog bite of left hand 10/09/2016  . Carotid arterial disease (HCC) 10/20/2015  . Anxiety state   . PAD (peripheral artery disease) (HCC) 10/10/2011  . Osteoporosis, senile 05/17/2011  . Tachycardia 08/15/2010  . Dyslipidemia 05/03/2009  . SMOKER 04/14/2009  . Essential hypertension 04/14/2009    Current Outpatient Prescriptions on File Prior to Visit  Medication Sig Dispense Refill  . ALPRAZolam (XANAX) 0.25 MG tablet TAKE 1 TABLET BY MOUTH AT BEDTIME AS NEEDED FOR ANXIETY 30 tablet 0  . amLODipine (NORVASC) 5 MG tablet TAKE 1 TABLET BY MOUTH EVERY NIGHT AT BEDTIME 90 tablet 2  . aspirin 81 MG tablet Take 81 mg by mouth daily before breakfast.     . atorvastatin (LIPITOR) 20 MG tablet Take 1 tablet (20 mg total) by mouth daily. 90 tablet 3  . calcium-vitamin D (OSCAL WITH D) 500-200 MG-UNIT per tablet Take 2  tablets by mouth daily.    . cilostazol (PLETAL) 50 MG tablet Take 1 tablet (50 mg total) by mouth 2 (two) times daily. 180 tablet 3  . losartan-hydrochlorothiazide (HYZAAR) 50-12.5 MG tablet TAKE 1 TABLET BY MOUTH ONCE DAILY 90 tablet 0  . PARoxetine (PAXIL) 10 MG tablet TAKE 1 TABLET BY MOUTH ONCE DAILY 90 tablet 0   No current facility-administered medications on file prior to visit.     Past Medical History:  Diagnosis Date  . ANEMIA-NOS   . DYSLIPIDEMIA   . Hemorrhoids   . HYPERTENSION   . Osteoporosis, senile    DEXA 03/2014: -3.3 R fem  . Peripheral vascular disease, unspecified (HCC) dx 02/2010 ABI  . SMOKER     Past Surgical History:  Procedure Laterality Date  . CATARACT EXTRACTION W/ INTRAOCULAR LENS IMPLANT Right 03/12/14   groat  . HEMORRHOID SURGERY  05/18/2011   Procedure: HEMORRHOIDECTOMY;  Surgeon: Robyne Askew, MD;  Location: [facility];  Service: General;  Laterality: N/A;  hemorrhoidectomy, three columns, internal and external  . TONSILLECTOMY  1946  . TUBAL LIGATION  1979    Social History   Social History  . Marital status: Single    Spouse name: N/A  . Number of children: N/A  . Years of education: N/A   Social History Main Topics  . Smoking status: Current Some Day Smoker    Packs/day: 0.50  Years: 50.00    Types: Cigarettes  . Smokeless tobacco: Never Used  . Alcohol use 0.0 oz/week     Comment: Social  . Drug use: No  . Sexual activity: Not Asked   Other Topics Concern  . None   Social History Narrative   Single, lives alone. 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    Family History  Problem Relation Age of Onset  . Breast cancer Mother   . Lung cancer Father   . Hypertension Brother   . Hypertension Other     Review of Systems  Constitutional: Negative for chills and fever.  Respiratory: Negative for cough, shortness of breath and wheezing.   Cardiovascular: Negative for chest pain, palpitations and leg swelling.    Neurological: Negative for light-headedness and headaches.       Objective:   Vitals:   11/01/16 1335  BP: 120/78  Pulse: 89  Resp: 16  Temp: 97.9 F (36.6 C)   Wt Readings from Last 3 Encounters:  11/01/16 97 lb 8 oz (44.2 kg)  10/09/16 96 lb (43.5 kg)  05/02/16 94 lb 8 oz (42.9 kg)   Body mass index is 16.74 kg/m.   Physical Exam    Constitutional: Appears well-developed and well-nourished. No distress.  HENT:  Head: Normocephalic and atraumatic.  Neck: Neck supple. No tracheal deviation present. No thyromegaly present.  No cervical lymphadenopathy Cardiovascular: Normal rate, regular rhythm and normal heart sounds.   No murmur heard. No carotid bruit .  No edema Pulmonary/Chest: Effort normal and breath sounds normal. No respiratory distress. No has no wheezes. No rales.  Skin: Skin is warm and dry. Not diaphoretic.  Psychiatric: Normal mood and affect. Behavior is normal.      Assessment & Plan:    See Problem List for Assessment and Plan of chronic medical problems.

## 2016-11-01 NOTE — Assessment & Plan Note (Signed)
Lipids controlled Continue statin 

## 2016-11-01 NOTE — Assessment & Plan Note (Addendum)
dexa done 05/2016 Taking calcium and vitamin d Walks dog for exercise Had prolia #1  06/2016

## 2016-11-01 NOTE — Patient Instructions (Signed)
  Test(s) ordered today. Your results will be released to MyChart (or called to you) after review, usually within 72hours after test completion. If any changes need to be made, you will be notified at that same time.  Medications reviewed and updated.  No changes recommended at this time.    Please followup in 6 months   

## 2016-11-01 NOTE — Assessment & Plan Note (Signed)
Controlled, stable Continue current dose of medication  

## 2016-11-01 NOTE — Assessment & Plan Note (Signed)
Stressed quitting smoking - she is slowly decreasing her smoking

## 2016-11-01 NOTE — Assessment & Plan Note (Signed)
BP well controlled Current regimen effective and well tolerated Continue current medications at current doses cmp  

## 2016-11-01 NOTE — Assessment & Plan Note (Signed)
Check a1c 

## 2016-11-02 ENCOUNTER — Encounter: Payer: Self-pay | Admitting: Internal Medicine

## 2016-11-02 ENCOUNTER — Other Ambulatory Visit: Payer: Self-pay | Admitting: Internal Medicine

## 2016-11-02 DIAGNOSIS — R7303 Prediabetes: Secondary | ICD-10-CM | POA: Insufficient documentation

## 2016-11-02 MED ORDER — POTASSIUM CHLORIDE CRYS ER 20 MEQ PO TBCR: 20.0000 meq | EXTENDED_RELEASE_TABLET | Freq: Every day | ORAL | 3 refills | Status: DC

## 2016-11-06 ENCOUNTER — Other Ambulatory Visit: Payer: Self-pay | Admitting: Internal Medicine

## 2016-11-20 ENCOUNTER — Other Ambulatory Visit: Payer: Self-pay | Admitting: Internal Medicine

## 2016-11-20 NOTE — Telephone Encounter (Signed)
[place] Controlled Substance Database checked. Okay to fill RX. Last filled 09/25/2016

## 2016-11-20 NOTE — Telephone Encounter (Signed)
RX faxed to [facility] 

## 2016-11-23 ENCOUNTER — Telehealth: Payer: Self-pay | Admitting: Internal Medicine

## 2016-11-23 NOTE — Telephone Encounter (Signed)
She can take imodium as needed

## 2016-11-23 NOTE — Telephone Encounter (Signed)
Patient states she is having on and off again diarrhea.  Primary in the morning.  States she feels fine.  Would like to know what Dr. Lawerance BachBurns would suggest she try OTC.

## 2016-11-24 NOTE — Telephone Encounter (Signed)
Notified pt w/MD response.../lmb 

## 2016-11-29 ENCOUNTER — Encounter (HOSPITAL_COMMUNITY): Payer: Self-pay

## 2016-11-29 ENCOUNTER — Encounter (HOSPITAL_COMMUNITY): Payer: Self-pay | Admitting: Internal Medicine

## 2016-11-29 ENCOUNTER — Ambulatory Visit (INDEPENDENT_AMBULATORY_CARE_PROVIDER_SITE_OTHER): Payer: Medicare Other | Admitting: Internal Medicine

## 2016-11-29 ENCOUNTER — Encounter: Payer: Self-pay | Admitting: Internal Medicine

## 2016-11-29 ENCOUNTER — Observation Stay (HOSPITAL_COMMUNITY)
Admission: EM | Admit: 2016-11-29 | Discharge: 2016-12-01 | Disposition: A | Discharge status: Home / Self Care | Payer: Medicare Other | Attending: Family Medicine | Admitting: Family Medicine

## 2016-11-29 VITALS — BP 84/60 | HR 110 | Temp 97.6°F | Resp 16 | Wt 89.0 lb

## 2016-11-29 DIAGNOSIS — I739 Peripheral vascular disease, unspecified: Secondary | ICD-10-CM | POA: Diagnosis not present

## 2016-11-29 DIAGNOSIS — Z961 Presence of intraocular lens: Secondary | ICD-10-CM | POA: Diagnosis not present

## 2016-11-29 DIAGNOSIS — Z8249 Family history of ischemic heart disease and other diseases of the circulatory system: Secondary | ICD-10-CM | POA: Insufficient documentation

## 2016-11-29 DIAGNOSIS — Z8619 Personal history of other infectious and parasitic diseases: Secondary | ICD-10-CM | POA: Diagnosis present

## 2016-11-29 DIAGNOSIS — E860 Dehydration: Secondary | ICD-10-CM | POA: Insufficient documentation

## 2016-11-29 DIAGNOSIS — Z9851 Tubal ligation status: Secondary | ICD-10-CM | POA: Insufficient documentation

## 2016-11-29 DIAGNOSIS — Z79899 Other long term (current) drug therapy: Secondary | ICD-10-CM | POA: Insufficient documentation

## 2016-11-29 DIAGNOSIS — Z803 Family history of malignant neoplasm of breast: Secondary | ICD-10-CM | POA: Insufficient documentation

## 2016-11-29 DIAGNOSIS — E785 Hyperlipidemia, unspecified: Secondary | ICD-10-CM | POA: Insufficient documentation

## 2016-11-29 DIAGNOSIS — I10 Essential (primary) hypertension: Secondary | ICD-10-CM | POA: Diagnosis present

## 2016-11-29 DIAGNOSIS — N179 Acute kidney failure, unspecified: Secondary | ICD-10-CM | POA: Diagnosis not present

## 2016-11-29 DIAGNOSIS — A0472 Enterocolitis due to Clostridium difficile, not specified as recurrent: Secondary | ICD-10-CM | POA: Diagnosis not present

## 2016-11-29 DIAGNOSIS — E876 Hypokalemia: Secondary | ICD-10-CM | POA: Diagnosis not present

## 2016-11-29 DIAGNOSIS — Z801 Family history of malignant neoplasm of trachea, bronchus and lung: Secondary | ICD-10-CM | POA: Insufficient documentation

## 2016-11-29 DIAGNOSIS — R197 Diarrhea, unspecified: Secondary | ICD-10-CM | POA: Diagnosis not present

## 2016-11-29 DIAGNOSIS — Z720 Tobacco use: Secondary | ICD-10-CM | POA: Diagnosis present

## 2016-11-29 DIAGNOSIS — Z9889 Other specified postprocedural states: Secondary | ICD-10-CM | POA: Insufficient documentation

## 2016-11-29 DIAGNOSIS — F17210 Nicotine dependence, cigarettes, uncomplicated: Secondary | ICD-10-CM | POA: Diagnosis not present

## 2016-11-29 DIAGNOSIS — R739 Hyperglycemia, unspecified: Secondary | ICD-10-CM | POA: Diagnosis present

## 2016-11-29 DIAGNOSIS — Z9841 Cataract extraction status, right eye: Secondary | ICD-10-CM | POA: Insufficient documentation

## 2016-11-29 DIAGNOSIS — E871 Hypo-osmolality and hyponatremia: Secondary | ICD-10-CM | POA: Diagnosis not present

## 2016-11-29 DIAGNOSIS — Z7982 Long term (current) use of aspirin: Secondary | ICD-10-CM | POA: Diagnosis not present

## 2016-11-29 DIAGNOSIS — Z66 Do not resuscitate: Secondary | ICD-10-CM | POA: Insufficient documentation

## 2016-11-29 DIAGNOSIS — F17200 Nicotine dependence, unspecified, uncomplicated: Secondary | ICD-10-CM

## 2016-11-29 DIAGNOSIS — M810 Age-related osteoporosis without current pathological fracture: Secondary | ICD-10-CM | POA: Insufficient documentation

## 2016-11-29 LAB — CBC
HCT: 36 % (ref 36.0–46.0)
HEMOGLOBIN: 13.2 g/dL (ref 12.0–15.0)
MCH: 30.3 pg (ref 26.0–34.0)
MCHC: 37.2 g/dL — AB (ref 30.0–36.0)
MCV: 81.6 fL (ref 78.0–100.0)
Platelets: 327 10*3/uL (ref 150–400)
RBC: 4.41 MIL/uL (ref 3.87–5.11)
RDW: 13.4 % (ref 11.5–15.5)
WBC: 9.3 10*3/uL (ref 4.0–10.5)

## 2016-11-29 LAB — COMPREHENSIVE METABOLIC PANEL
ALBUMIN: 3.7 g/dL (ref 3.5–5.0)
ALK PHOS: 42 U/L (ref 38–126)
ALT: 15 U/L (ref 14–54)
ANION GAP: 16 — AB (ref 5–15)
AST: 22 U/L (ref 15–41)
BILIRUBIN TOTAL: 0.6 mg/dL (ref 0.3–1.2)
BUN: 63 mg/dL — ABNORMAL HIGH (ref 6–20)
CALCIUM: 8.1 mg/dL — AB (ref 8.9–10.3)
CO2: 21 mmol/L — ABNORMAL LOW (ref 22–32)
Chloride: 93 mmol/L — ABNORMAL LOW (ref 101–111)
Creatinine, Ser: 2.53 mg/dL — ABNORMAL HIGH (ref 0.44–1.00)
GFR calc non Af Amer: 17 mL/min — ABNORMAL LOW (ref >60)
GFR, EST AFRICAN AMERICAN: 20 mL/min — AB (ref >60)
GLUCOSE: 152 mg/dL — AB (ref 65–99)
Potassium: 2.1 mmol/L — CL (ref 3.5–5.1)
Sodium: 130 mmol/L — ABNORMAL LOW (ref 135–145)
TOTAL PROTEIN: 8.1 g/dL (ref 6.5–8.1)

## 2016-11-29 LAB — I-STAT CG4 LACTIC ACID, ED: LACTIC ACID, VENOUS: 2.03 mmol/L — AB (ref 0.5–1.9)

## 2016-11-29 LAB — LIPASE, BLOOD: Lipase: 22 U/L (ref 11–51)

## 2016-11-29 MED ORDER — POTASSIUM CHLORIDE CRYS ER 20 MEQ PO TBCR
40.0000 meq | EXTENDED_RELEASE_TABLET | Freq: Once | ORAL | Status: AC
  Administered 2016-11-29: 40 meq via ORAL
  Filled 2016-11-29: qty 2

## 2016-11-29 MED ORDER — POTASSIUM CHLORIDE 10 MEQ/100ML IV SOLN
10.0000 meq | Freq: Once | INTRAVENOUS | Status: AC
Start: 2016-11-29 — End: 2016-11-29
  Administered 2016-11-29: 10 meq via INTRAVENOUS
  Filled 2016-11-29: qty 100

## 2016-11-29 MED ORDER — ONDANSETRON HCL 4 MG/2ML IJ SOLN
4.0000 mg | Freq: Four times a day (QID) | INTRAMUSCULAR | Status: DC | PRN
  Administered 2016-11-29: 4 mg via INTRAVENOUS
  Filled 2016-11-29: qty 2

## 2016-11-29 MED ORDER — ONDANSETRON 4 MG PO TBDP
4.0000 mg | ORAL_TABLET | Freq: Once | ORAL | Status: AC | PRN
  Administered 2016-11-29: 4 mg via ORAL
  Filled 2016-11-29: qty 1

## 2016-11-29 MED ORDER — SODIUM CHLORIDE 0.9 % IV BOLUS (SEPSIS)
1000.0000 mL | Freq: Once | INTRAVENOUS | Status: AC
  Administered 2016-11-29: 1000 mL via INTRAVENOUS

## 2016-11-29 MED ORDER — SODIUM CHLORIDE 0.9 % IV SOLN
Freq: Once | INTRAVENOUS | Status: AC
  Administered 2016-11-29: 22:00:00 via INTRAVENOUS

## 2016-11-29 NOTE — ED Notes (Signed)
Patient told writer she could not void at this time but would try to void after receiving fluids.

## 2016-11-29 NOTE — Progress Notes (Signed)
Subjective:    Patient ID: Jennifer [name], female    DOB: 11-30-1940, 76 y.o.   MRN: [id number]  HPI She is here for an acute visit.   Diarrhea:  She started having diarrhea about one week ago.  It is non bloody watery and she was having multiple bowel movements a day.  She had some mild cramping before a BM which resolved after having a bowel movement.  She denies other abdominal pain.  She denies fever/chills.  She has had nausea and dry heaving.  She has decreased appetite.  In the past week she has eaten one piece of toast, 2 bowls of soup and she is trying to drink fluids.  She has had lightheadedness.    She has been taking her BP medications.  Her BP has been low at home.  She has been taking imodium and that has helped to decrease her bowel movements.  She denies travel.  She did have a few days of antibiotics about 6 weeks ago after her dog bit her.    Medications and allergies reviewed with patient and updated if appropriate.  Patient Active Problem List   Diagnosis Date Noted  . Prediabetes 11/02/2016  . Hyperglycemia 11/01/2016  . Carotid arterial disease (HCC) 10/20/2015  . Anxiety state   . PAD (peripheral artery disease) (HCC) 10/10/2011  . Osteoporosis, senile 05/17/2011  . Dyslipidemia 05/03/2009  . SMOKER 04/14/2009  . Essential hypertension 04/14/2009    No current facility-administered medications on file prior to visit.    Current Outpatient Prescriptions on File Prior to Visit  Medication Sig Dispense Refill  . ALPRAZolam (XANAX) 0.25 MG tablet TAKE 1 TABLET BY MOUTH AT BEDTIME AS NEEDED FOR ANXIETY 30 tablet 0  . amLODipine (NORVASC) 5 MG tablet TAKE 1 TABLET BY MOUTH EVERY NIGHT AT BEDTIME 90 tablet 2  . aspirin 81 MG tablet Take 81 mg by mouth daily before breakfast.     . atorvastatin (LIPITOR) 20 MG tablet TAKE 1 TABLET BY MOUTH ONCE DAILY 90 tablet 3  . calcium-vitamin D (OSCAL WITH D) 500-200 MG-UNIT per tablet Take 2 tablets by mouth daily.      . cilostazol (PLETAL) 50 MG tablet Take 1 tablet (50 mg total) by mouth 2 (two) times daily. 180 tablet 3  . losartan-hydrochlorothiazide (HYZAAR) 50-12.5 MG tablet TAKE 1 TABLET BY MOUTH ONCE DAILY 90 tablet 0  . PARoxetine (PAXIL) 10 MG tablet TAKE 1 TABLET BY MOUTH ONCE DAILY 90 tablet 0  . potassium chloride SA (K-DUR,KLOR-CON) 20 MEQ tablet Take 1 tablet (20 mEq total) by mouth daily. 90 tablet 3    Past Medical History:  Diagnosis Date  . ANEMIA-NOS   . DYSLIPIDEMIA   . Hemorrhoids   . HYPERTENSION   . Osteoporosis, senile    DEXA 03/2014: -3.3 R fem  . Peripheral vascular disease, unspecified (HCC) dx 02/2010 ABI  . SMOKER     Past Surgical History:  Procedure Laterality Date  . CATARACT EXTRACTION W/ INTRAOCULAR LENS IMPLANT Right 03/12/14   groat  . HEMORRHOID SURGERY  05/18/2011   Procedure: HEMORRHOIDECTOMY;  Surgeon: Robyne Askew, MD;  Location: [facility];  Service: General;  Laterality: N/A;  hemorrhoidectomy, three columns, internal and external  . TONSILLECTOMY  1946  . TUBAL LIGATION  1979    Social History   Social History  . Marital status: Single    Spouse name: N/A  . Number of children: N/A  . Years of education:  N/A   Social History Main Topics  . Smoking status: Current Every Day Smoker    Packs/day: 0.50    Years: 50.00    Types: Cigarettes  . Smokeless tobacco: Never Used  . Alcohol use 0.0 oz/week     Comment: Social  . Drug use: No  . Sexual activity: Not Asked   Other Topics Concern  . None   Social History Narrative   Single, lives alone. 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    Family History  Problem Relation Age of Onset  . Breast cancer Mother   . Lung cancer Father   . Hypertension Brother   . Hypertension Other     Review of Systems  Constitutional: Positive for appetite change and fatigue. Negative for chills and fever.  Respiratory: Negative for cough, shortness of breath and wheezing.   Cardiovascular:  Negative for chest pain, palpitations and leg swelling.  Gastrointestinal: Positive for abdominal pain (only with a BM), diarrhea, nausea and vomiting. Negative for blood in stool.  Genitourinary: Negative for dysuria and hematuria.  Neurological: Positive for light-headedness. Negative for headaches.       Objective:   Vitals:   11/29/16 2123  BP: (!) 84/60  Pulse: (!) 110  Resp: 16  Temp: 97.6 F (36.4 C)   Filed Weights   11/29/16 2123  Weight: 89 lb (40.4 kg)   Body mass index is 15.28 kg/m.  Wt Readings from Last 3 Encounters:  11/29/16 89 lb (40.4 kg)  11/29/16 89 lb (40.4 kg)  11/01/16 97 lb 8 oz (44.2 kg)     Physical Exam Constitutional: Appears well-developed and well-nourished. No distress.  HENT:  Head: Normocephalic and atraumatic.  Neck: Neck supple. No tracheal deviation present. No thyromegaly present.  No cervical lymphadenopathy Cardiovascular: Normal rate, regular rhythm and normal heart sounds.   3/6 systolic murmur heard. No carotid bruit .  No edema Pulmonary/Chest: Effort normal and breath sounds normal. No respiratory distress. No has no wheezes. No rales.  Abdomen: soft, non tender, non distended Skin: Skin is warm and dry. Not diaphoretic.  Psychiatric: Normal mood and affect. Behavior is normal.         Assessment & Plan:   See Problem List for Assessment and Plan of chronic medical problems.

## 2016-11-29 NOTE — ED Triage Notes (Signed)
Pt c/o diarrhea x 6 days and nausea x 3 days.  Denies pain.  Pt has been seen by PCP for same and started on Imodium w/ some relief.

## 2016-11-29 NOTE — ED Notes (Signed)
Pt still calling out that they are unable to use the restroom and void for a urine specimen.

## 2016-11-29 NOTE — Assessment & Plan Note (Signed)
?   Bacterial vs viral Unable to get labs /stool sample because our computers were down when I saw her No abdominal pain Diarrhea may be improving but difficult to know for sure due to taking imodium - no abdominal pain so ok to continue imodium I am much more concerned about her dehydration and after discussing this with her [facility] for evaluation and treatment of her dehydration

## 2016-11-29 NOTE — ED Notes (Signed)
Critical potassium 2.1.

## 2016-11-29 NOTE — ED Notes (Signed)
Date and time results received: 11/29/16 6:49 PM  (use smartphrase ".now" to insert current time)  Test: Potassium Critical Value: 2.1  Name of Provider Notified: Pt in lobby.  Will notify EDP when in a room.  Orders Received? Or Actions Taken?: Trying to get patient in a room.

## 2016-11-29 NOTE — Assessment & Plan Note (Signed)
She is clinically dehydrated. Her BP is low and she is still taking losartan - concern about AKI and severe dehydration She will not likely be able to drink enough to replete herself and if severe she will need IVF I can not check labs due to our computers being down She agrees to go to the ED - will likely need IVF, anti-nausea medication and labs

## 2016-11-29 NOTE — ED Notes (Signed)
Writer states they reminded patient that we needed urine specimen but pt states she still cannot void.

## 2016-11-29 NOTE — Patient Instructions (Signed)
Go the the ED for further evaluation and treatment of your dehydration and diarrhea.

## 2016-11-29 NOTE — H&P (Signed)
History and Physical    Jennifer BackersSusan L Simkin ZOX:[id number] DOB: May 02, 1940 DOA: 11/29/2016  PCP: Pincus SanesBurns, Stacy J, MD Consultants:  Clifton JamesMcAlhany - cardiology Patient coming from:  Home - lives alone; NOK: brother and sister, [phone]; [phone]  Chief Complaint: dehydration  HPI: Jennifer [name] is a 76 y.o. female with medical history significant of tobacco dependence; PVD; HTN; and HLD presenting with dehydration.  Last Thursday she developed diarrhea.  She called PCP Thursday PM and heard back the next day - was instructed to take Imodium.  She had some improvement but continued to have diarrhea.  She took pedialyte but it was too disgusting to drink (she "just barfed it back up").  She was feeling a little bit better but today felt worse and called PCP again.  She felt weak, "real spacey, like I was gonna faint".  She went to Dr. Lawerance BachBurns and she recommended that the patient come in to the ER for IVF.  The patient does take potassium pills but she has not taken them this week because she felt so bad.  No idea what led to the diarrhea.  Started off with 3 stools per day and then with the Imodium just 1 stool and "drips and drabs."  Yesterday she had 1 stool last night and she had 1 small stool this AM about 9AM.  No myalgias, muscle cramps.  Usual BP 118/80 or so.  She took antibiotics about 6 weeks ago and "they didn't agree with me".  [facility] and then went to PCP for f/u on 6/18 and then she stopped the Augmentin due to GI upset.   ED Course: Dehydration with resultant hypotension, severe hypokalemia  Review of Systems: As per HPI; otherwise review of systems reviewed and negative.   Ambulatory Status:  Ambulates without assistance  Past Medical History:  Diagnosis Date  . ANEMIA-NOS   . DYSLIPIDEMIA   . Hemorrhoids   . HYPERTENSION   . Osteoporosis, senile    DEXA 03/2014: -3.3 R fem  . Peripheral vascular disease, unspecified (HCC) dx 02/2010 ABI  . SMOKER     Past  Surgical History:  Procedure Laterality Date  . CATARACT EXTRACTION W/ INTRAOCULAR LENS IMPLANT Right 03/12/14   groat  . HEMORRHOID SURGERY  05/18/2011   Procedure: HEMORRHOIDECTOMY;  Surgeon: Robyne AskewPaul S Toth III, MD;  Location: [facility];  Service: General;  Laterality: N/A;  hemorrhoidectomy, three columns, internal and external  . TONSILLECTOMY  1946  . TUBAL LIGATION  1979    Social History   Social History  . Marital status: Single    Spouse name: N/A  . Number of children: N/A  . Years of education: N/A   Occupational History  . retired    Social History Main Topics  . Smoking status: Current Every Day Smoker    Packs/day: 0.50    Years: 62.00    Types: Cigarettes  . Smokeless tobacco: Never Used  . Alcohol use 0.0 oz/week     Comment: Social  . Drug use: No  . Sexual activity: Not on file   Other Topics Concern  . Not on file   Social History Narrative   Single, lives alone. 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    No Known Allergies  Family History  Problem Relation Age of Onset  . Breast cancer Mother   . Lung cancer Father   . Hypertension Brother   . Hypertension Other     Prior to Admission medications  Medication Sig Start Date End Date Taking? Authorizing Provider  ALPRAZolam (XANAX) 0.25 MG tablet TAKE 1 TABLET BY MOUTH AT BEDTIME AS NEEDED FOR ANXIETY 11/20/16   Burns, Bobette Mo, MD  amLODipine (NORVASC) 5 MG tablet TAKE 1 TABLET BY MOUTH EVERY NIGHT AT BEDTIME 06/12/16   Pincus Sanes, MD  aspirin 81 MG tablet Take 81 mg by mouth daily before breakfast.     [provider]  atorvastatin (LIPITOR) 20 MG tablet TAKE 1 TABLET BY MOUTH ONCE DAILY 11/06/16   Pincus Sanes, MD  calcium-vitamin D (OSCAL WITH D) 500-200 MG-UNIT per tablet Take 2 tablets by mouth daily.    [provider]  cilostazol (PLETAL) 50 MG tablet Take 1 tablet (50 mg total) by mouth 2 (two) times daily. 04/21/15   Newt Lukes, MD    losartan-hydrochlorothiazide (HYZAAR) 50-12.5 MG tablet TAKE 1 TABLET BY MOUTH ONCE DAILY 10/11/16   Pincus Sanes, MD  PARoxetine (PAXIL) 10 MG tablet TAKE 1 TABLET BY MOUTH ONCE DAILY 10/19/16   Pincus Sanes, MD  potassium chloride SA (K-DUR,KLOR-CON) 20 MEQ tablet Take 1 tablet (20 mEq total) by mouth daily. 11/02/16   Pincus Sanes, MD    Physical Exam: Vitals:   11/29/16 2230 11/29/16 2255 11/29/16 2300 11/30/16 0100  BP: (!) 80/60 92/65 (!) 81/61 (!) 88/59  Pulse:  86  83  Resp: 18 16 20 20   Temp:    97.7 F (36.5 C)  TempSrc:      SpO2:  98%  97%  Weight:    42.6 kg (93 lb 14.7 oz)  Height:    5\' 4"  (1.626 m)     General:  Appears calm and comfortable and is NAD; at the conclusion of our visit she began vomiting acutely and had what appeared to be gross hematemesis (mild) Eyes:  PERRL, EOMI, normal lids, iris ENT:  grossly normal hearing, lips & tongue, mmm; dentures in place (removed during emesis) Neck:  no LAD, masses or thyromegaly Cardiovascular:  RRR, no m/r/g. No LE edema.  Respiratory:   CTA bilaterally with no wheezes/rales/rhonchi.  Normal respiratory effort. Abdomen:  soft, NT, ND, NABS Back:   normal alignment, no CVAT Skin:  no rash or induration seen on limited exam Musculoskeletal:  grossly normal tone BUE/BLE, good ROM, no bony abnormality Lower extremity:  No LE edema.  Limited foot exam with no ulcerations.  2+ distal pulses. Psychiatric:  grossly normal mood and affect, speech fluent and appropriate, AOx3 Neurologic:  CN 2-12 grossly intact, moves all extremities in coordinated fashion, sensation intact    Radiological Exams on Admission: No results found.  EKG: Independently reviewed.  NSR/sinus tachycardia with rate 99; nonspecific ST changes with no evidence of acute ischemia  Labs on Admission: I have personally reviewed the available labs and imaging studies at the time of the admission.  Pertinent labs:  K+ 2.1, prior 3.1 on 7/11 Glucose  152 BUN 63/Creatinine 2.53; prior 14/0.74 on 7/11 Lactate 2.01, repeat 1.3 A1c 5.8 on 7/11   Assessment/Plan Principal Problem:   Hypokalemia Active Problems:   SMOKER   Essential hypertension   Hyperglycemia   Diarrhea   Acute renal failure (ARF) (HCC)   Hypokalemia, severe -Patient with prolonged n/v/d and she has not taken her daily potassium supplementation -Suspect that this combination is what led to her severely low K+ -She reports taking 2 of her 20 mEq tabs tonight -She was given 10 mEq IV and 40 mEq  PO KCl -Would expect that this would increase K+ to 2.8 -Recheck K+ is currently 2.2 - perhaps lower than expected due to emesis in the ER -Will give 60 mEq IV Kcl at this time in addition to 20 mEq in IVF -Recheck BMP at 0500 -Will observe on telemetry due to risk of arrhythmias with severe hypokalemia  Acute renal failure -Creatinine appears to be improving -Very likely related to increased GI losses from diarrhea/vomiting since last week in conjunction with ARB-HCTZ use -Continue IVF rehydration and monitor  Diarrhea with vomiting -It would be unusual to have recurrent n/v/d 6 weeks out from Augmentin use -C. Difficile colitis is possible; stool testing pending -Diarrhea has resolved other than 1 small stool this AM -Patient did have emesis while in the ER and it appeared to have gross blood in it -Will order gastroccult  -Start Protonix IV BID -Repeat CBC shows Hgb 1 gram lower but this may be related to hemodilution -Will recheck CBC in AM  HTN -Hold Norvasc due to hypotension related to dehydration -Hold Losartan-HCTZ  Hyperglycemia -May be stress response -Will follow with fasting AM labs -It is unlikely that he will need acute or chronic treatment for this issue  Tobacco dependence -Encourage cessation.  This was discussed with the patient and should be reviewed on an ongoing basis.   -She voices a sincere interest to quit. -Patch declined by  patient.   DVT prophylaxis: SCDs Code Status: DNR - confirmed with patient Family Communication: None present Disposition Plan:  Home once clinically improved Consults called: None  Admission status: It is my clinical opinion that referral for OBSERVATION is reasonable and necessary in this patient based on the above information provided. The aforementioned taken together are felt to place the patient at high risk for further clinical deterioration. However it is anticipated that the patient may be medically stable for discharge from the hospital within 24 to 48 hours.    Jonah Blue MD [facility]  If note is complete, please contact covering daytime or nighttime physician. [email or web address] Password [email or web address]  11/30/2016, 2:10 AM

## 2016-11-29 NOTE — ED Notes (Signed)
Pt ambulated to bathroom to try to void but was unable to give urine specimen at this time. Pt notified writer she would let me know as soon as she could void.

## 2016-11-29 NOTE — ED Provider Notes (Signed)
[facility]-EMERGENCY DEPT Provider Note   CSN: [id number] Arrival date & time: 11/29/16  1710     History   Chief Complaint Chief Complaint  Patient presents with  . Diarrhea    HPI Jennifer [name] is a 76 y.o. female.  HPI  76 y.o. female with a hx of HTN, presents to the Emergency Department today from PCP due to abnormal lab. Told Potassium was low. Pt states this is likely due to diarrhea x 6 days as well as nausea x 3 days. States this is slowly improving with imodium. Notes ABX usage x 2-3 weeks ago for a dog bite. Just used 1 weeks worth. Denies symptoms currently. No CP/SOB/ABD pain. No N/V currently. No diarrhea today. No back pain. No headache. No vision changes. Pt able to tolerate PO. No other symptoms noted.   Past Medical History:  Diagnosis Date  . ANEMIA-NOS   . DYSLIPIDEMIA   . Hemorrhoids   . HYPERTENSION   . Osteoporosis, senile    DEXA 03/2014: -3.3 R fem  . Peripheral vascular disease, unspecified (HCC) dx 02/2010 ABI  . SMOKER     Patient Active Problem List   Diagnosis Date Noted  . Prediabetes 11/02/2016  . Hyperglycemia 11/01/2016  . Carotid arterial disease (HCC) 10/20/2015  . Anxiety state   . PAD (peripheral artery disease) (HCC) 10/10/2011  . Osteoporosis, senile 05/17/2011  . Dyslipidemia 05/03/2009  . SMOKER 04/14/2009  . Essential hypertension 04/14/2009    Past Surgical History:  Procedure Laterality Date  . CATARACT EXTRACTION W/ INTRAOCULAR LENS IMPLANT Right 03/12/14   groat  . HEMORRHOID SURGERY  05/18/2011   Procedure: HEMORRHOIDECTOMY;  Surgeon: Robyne AskewPaul S Toth III, MD;  Location: [facility];  Service: General;  Laterality: N/A;  hemorrhoidectomy, three columns, internal and external  . TONSILLECTOMY  1946  . TUBAL LIGATION  1979    OB History    No data available       Home Medications    Prior to Admission medications   Medication Sig Start Date End Date Taking? Authorizing Provider  ALPRAZolam (XANAX) 0.25 MG tablet TAKE 1  TABLET BY MOUTH AT BEDTIME AS NEEDED FOR ANXIETY 11/20/16   Burns, Bobette MoStacy J, MD  amLODipine (NORVASC) 5 MG tablet TAKE 1 TABLET BY MOUTH EVERY NIGHT AT BEDTIME 06/12/16   Pincus SanesBurns, Stacy J, MD  aspirin 81 MG tablet Take 81 mg by mouth daily before breakfast.     [provider]  atorvastatin (LIPITOR) 20 MG tablet TAKE 1 TABLET BY MOUTH ONCE DAILY 11/06/16   Pincus SanesBurns, Stacy J, MD  calcium-vitamin D (OSCAL WITH D) 500-200 MG-UNIT per tablet Take 2 tablets by mouth daily.    [provider]  cilostazol (PLETAL) 50 MG tablet Take 1 tablet (50 mg total) by mouth 2 (two) times daily. 04/21/15   Newt LukesLeschber, Valerie A, MD  losartan-hydrochlorothiazide (HYZAAR) 50-12.5 MG tablet TAKE 1 TABLET BY MOUTH ONCE DAILY 10/11/16   Pincus SanesBurns, Stacy J, MD  PARoxetine (PAXIL) 10 MG tablet TAKE 1 TABLET BY MOUTH ONCE DAILY 10/19/16   Pincus SanesBurns, Stacy J, MD  potassium chloride SA (K-DUR,KLOR-CON) 20 MEQ tablet Take 1 tablet (20 mEq total) by mouth daily. 11/02/16   Pincus SanesBurns, Stacy J, MD    Family History Family History  Problem Relation Age of Onset  . Breast cancer Mother   . Lung cancer Father   . Hypertension Brother   . Hypertension Other     Social History Social History  Substance Use Topics  .  Smoking status: Current Every Day Smoker    Packs/day: 0.50    Years: 50.00    Types: Cigarettes  . Smokeless tobacco: Never Used  . Alcohol use 0.0 oz/week     Comment: Social     Allergies   Patient has no known allergies.   Review of Systems Review of Systems ROS reviewed and all are negative for acute change except as noted in the HPI.  Physical Exam Updated Vital Signs BP 90/71   Pulse (!) 106   Temp (!) 97.5 F (36.4 C) (Oral)   Resp 16   Ht 5\' 4"  (1.626 m)   Wt 40.4 kg (89 lb)   SpO2 97%   BMI 15.28 kg/m   Physical Exam  Constitutional: She is oriented to person, place, and time. Vital signs are normal. She appears well-developed and well-nourished. No distress.  HENT:  Head:  Normocephalic and atraumatic.  Right Ear: Hearing, tympanic membrane, external ear and ear canal normal.  Left Ear: Hearing, tympanic membrane, external ear and ear canal normal.  Nose: Nose normal.  Mouth/Throat: Uvula is midline, oropharynx is clear and moist and mucous membranes are normal. No trismus in the jaw. No oropharyngeal exudate, posterior oropharyngeal erythema or tonsillar abscesses.  Mucous membranes dry  Eyes: Pupils are equal, round, and reactive to light. Conjunctivae and EOM are normal.  Neck: Normal range of motion. Neck supple. No tracheal deviation present.  Cardiovascular: Normal rate, regular rhythm, S1 normal, S2 normal, normal heart sounds, intact distal pulses and normal pulses.   Pulmonary/Chest: Effort normal and breath sounds normal. No respiratory distress. She has no decreased breath sounds. She has no wheezes. She has no rhonchi. She has no rales.  Abdominal: Normal appearance and bowel sounds are normal. There is no tenderness.  Musculoskeletal: Normal range of motion.  Neurological: She is alert and oriented to person, place, and time.  Skin: Skin is warm and dry.  Psychiatric: She has a normal mood and affect. Her speech is normal and behavior is normal. Thought content normal.  Vitals reviewed.    ED Treatments / Results  Labs (all labs ordered are listed, but only abnormal results are displayed) Labs Reviewed  COMPREHENSIVE METABOLIC PANEL - Abnormal; Notable for the following:       Result Value   Sodium 130 (*)    Potassium 2.1 (*)    Chloride 93 (*)    CO2 21 (*)    Glucose, Bld 152 (*)    BUN 63 (*)    Creatinine, Ser 2.53 (*)    Calcium 8.1 (*)    GFR calc non Af Amer 17 (*)    GFR calc Af Amer 20 (*)    Anion gap 16 (*)    All other components within normal limits  CBC - Abnormal; Notable for the following:    MCHC 37.2 (*)    All other components within normal limits  C DIFFICILE QUICK SCREEN W PCR REFLEX  LIPASE, BLOOD    URINALYSIS, ROUTINE W REFLEX MICROSCOPIC  I-STAT CG4 LACTIC ACID, ED    EKG  EKG Interpretation None       Radiology No results found.  Procedures Procedures (including critical care time)  Medications Ordered in ED Medications  sodium chloride 0.9 % bolus 1,000 mL (1,000 mLs Intravenous New Bag/Given 11/29/16 2007)  potassium chloride 10 mEq in 100 mL IVPB (10 mEq Intravenous New Bag/Given 11/29/16 2007)  0.9 %  sodium chloride infusion (not administered)  ondansetron (ZOFRAN-ODT)  disintegrating tablet 4 mg (4 mg Oral Given 11/29/16 1754)  potassium chloride SA (K-DUR,KLOR-CON) CR tablet 40 mEq (40 mEq Oral Given 11/29/16 1952)     Initial Impression / Assessment and Plan / ED Course  I have reviewed the triage vital signs and the nursing notes.  Pertinent labs & imaging results that were available during my care of the patient were reviewed by me and considered in my medical decision making (see chart for details).  Final Clinical Impressions(s) / ED Diagnoses  {I have reviewed and evaluated the relevant laboratory values.  {I have interpreted the relevant EKG. {I have reviewed the relevant previous healthcare records.  {I obtained HPI from historian.   ED Course:  Assessment: Pt is a 76 y.o. female  with a hx of HTN, presents to the Emergency Department today from PCP due to abnormal lab. Told Potassium was low. Pt states this is likely due to diarrhea x 6 days as well as nausea x 3 days. States this is slowly improving with imodium. Notes ABX usage x 2-3 weeks ago for a dog bite. Just used 1 weeks worth. Denies symptoms currently. No CP/SOB/ABD pain. No N/V currently. No diarrhea today. No back pain. No headache. No vision changes. Pt able to tolerate PO. On exam, pt in NAD. Nontoxic/nonseptic appearing. VSS. BP 84/59. HR 98. Afebrile. Lungs CTA. Heart RRR. Abdomen nontender soft. CBC without leukocytosis. Potassium 2.1. Given PO/IV potassium. EKG unremarkable. Sodium 130.  Creatinine 2.53. From baseline 0.7. BUN 63. Given NS bolus as well as infusion in ED. AKI 2/2 V/D. C Diff panel ordered, but unlikely. Doubt infectious etiology. Lactic and BP 2/2 dehydration. Plan is to Admit to medicine.   Hypotensive The patient is noted to have a MAP's <65/ SBP's <90. With the current information available to me, I don't think the patient is in septic shock. The MAP's <65/ SBP's <90, is related to an acute condition that is not due to an infection likely 2/2 dehydration. Creatinine elevated as well as BUN.  Disposition/Plan:  Admit Pt acknowledges and agrees with plan  Supervising Physician Tilden Fossa, MD  Final diagnoses:  Hypokalemia  AKI (acute kidney injury) (HCC)  Hyponatremia  Dehydration    New Prescriptions New Prescriptions   No medications on file     Wilber Bihari 11/29/16 2022    Tilden Fossa, MD 12/01/16 1454

## 2016-11-30 DIAGNOSIS — E876 Hypokalemia: Secondary | ICD-10-CM | POA: Diagnosis not present

## 2016-11-30 DIAGNOSIS — F17200 Nicotine dependence, unspecified, uncomplicated: Secondary | ICD-10-CM | POA: Diagnosis not present

## 2016-11-30 DIAGNOSIS — E871 Hypo-osmolality and hyponatremia: Secondary | ICD-10-CM | POA: Diagnosis not present

## 2016-11-30 DIAGNOSIS — I10 Essential (primary) hypertension: Secondary | ICD-10-CM | POA: Diagnosis not present

## 2016-11-30 DIAGNOSIS — A0472 Enterocolitis due to Clostridium difficile, not specified as recurrent: Secondary | ICD-10-CM

## 2016-11-30 DIAGNOSIS — N179 Acute kidney failure, unspecified: Secondary | ICD-10-CM | POA: Diagnosis present

## 2016-11-30 DIAGNOSIS — E860 Dehydration: Secondary | ICD-10-CM | POA: Diagnosis not present

## 2016-11-30 DIAGNOSIS — N170 Acute kidney failure with tubular necrosis: Secondary | ICD-10-CM | POA: Diagnosis not present

## 2016-11-30 LAB — BASIC METABOLIC PANEL
ANION GAP: 10 (ref 5–15)
ANION GAP: 12 (ref 5–15)
ANION GAP: 13 (ref 5–15)
BUN: 48 mg/dL — ABNORMAL HIGH (ref 6–20)
BUN: 59 mg/dL — ABNORMAL HIGH (ref 6–20)
BUN: 60 mg/dL — ABNORMAL HIGH (ref 6–20)
CHLORIDE: 101 mmol/L (ref 101–111)
CHLORIDE: 98 mmol/L — AB (ref 101–111)
CO2: 19 mmol/L — ABNORMAL LOW (ref 22–32)
CO2: 19 mmol/L — AB (ref 22–32)
CO2: 19 mmol/L — AB (ref 22–32)
CREATININE: 1.95 mg/dL — AB (ref 0.44–1.00)
Calcium: 6.9 mg/dL — ABNORMAL LOW (ref 8.9–10.3)
Calcium: 7.1 mg/dL — ABNORMAL LOW (ref 8.9–10.3)
Calcium: 7.2 mg/dL — ABNORMAL LOW (ref 8.9–10.3)
Chloride: 106 mmol/L (ref 101–111)
Creatinine, Ser: 1.46 mg/dL — ABNORMAL HIGH (ref 0.44–1.00)
Creatinine, Ser: 2.18 mg/dL — ABNORMAL HIGH (ref 0.44–1.00)
GFR calc non Af Amer: 20 mL/min — ABNORMAL LOW (ref >60)
GFR calc non Af Amer: 23 mL/min — ABNORMAL LOW (ref >60)
GFR, EST AFRICAN AMERICAN: 24 mL/min — AB (ref >60)
GFR, EST AFRICAN AMERICAN: 27 mL/min — AB (ref >60)
GFR, EST AFRICAN AMERICAN: 39 mL/min — AB (ref >60)
GFR, EST NON AFRICAN AMERICAN: 33 mL/min — AB (ref >60)
GLUCOSE: 77 mg/dL (ref 65–99)
Glucose, Bld: 100 mg/dL — ABNORMAL HIGH (ref 65–99)
Glucose, Bld: 96 mg/dL (ref 65–99)
POTASSIUM: 2.2 mmol/L — AB (ref 3.5–5.1)
POTASSIUM: 2.4 mmol/L — AB (ref 3.5–5.1)
POTASSIUM: 3.5 mmol/L (ref 3.5–5.1)
SODIUM: 130 mmol/L — AB (ref 135–145)
SODIUM: 132 mmol/L — AB (ref 135–145)
Sodium: 135 mmol/L (ref 135–145)

## 2016-11-30 LAB — CBC
HCT: 30.2 % — ABNORMAL LOW (ref 36.0–46.0)
HCT: 30.2 % — ABNORMAL LOW (ref 36.0–46.0)
HEMATOCRIT: 33.5 % — AB (ref 36.0–46.0)
HEMOGLOBIN: 10.8 g/dL — AB (ref 12.0–15.0)
HEMOGLOBIN: 10.8 g/dL — AB (ref 12.0–15.0)
HEMOGLOBIN: 12.2 g/dL (ref 12.0–15.0)
MCH: 29.4 pg (ref 26.0–34.0)
MCH: 29.9 pg (ref 26.0–34.0)
MCH: 30.2 pg (ref 26.0–34.0)
MCHC: 35.8 g/dL (ref 30.0–36.0)
MCHC: 35.8 g/dL (ref 30.0–36.0)
MCHC: 36.4 g/dL — AB (ref 30.0–36.0)
MCV: 82.1 fL (ref 78.0–100.0)
MCV: 82.3 fL (ref 78.0–100.0)
MCV: 84.4 fL (ref 78.0–100.0)
PLATELETS: 296 10*3/uL (ref 150–400)
Platelets: 304 10*3/uL (ref 150–400)
Platelets: 307 10*3/uL (ref 150–400)
RBC: 3.58 MIL/uL — AB (ref 3.87–5.11)
RBC: 3.67 MIL/uL — AB (ref 3.87–5.11)
RBC: 4.08 MIL/uL (ref 3.87–5.11)
RDW: 13.5 % (ref 11.5–15.5)
RDW: 13.6 % (ref 11.5–15.5)
RDW: 13.6 % (ref 11.5–15.5)
WBC: 6.9 10*3/uL (ref 4.0–10.5)
WBC: 7.6 10*3/uL (ref 4.0–10.5)
WBC: 8.1 10*3/uL (ref 4.0–10.5)

## 2016-11-30 LAB — LACTIC ACID, PLASMA
LACTIC ACID, VENOUS: 1 mmol/L (ref 0.5–1.9)
LACTIC ACID, VENOUS: 1.3 mmol/L (ref 0.5–1.9)

## 2016-11-30 LAB — MAGNESIUM: Magnesium: 2 mg/dL (ref 1.7–2.4)

## 2016-11-30 LAB — OCCULT BLOOD X 1 CARD TO LAB, STOOL: FECAL OCCULT BLD: POSITIVE — AB

## 2016-11-30 LAB — C DIFFICILE QUICK SCREEN W PCR REFLEX
C Diff antigen: POSITIVE — AB
C Diff toxin: NEGATIVE

## 2016-11-30 LAB — CLOSTRIDIUM DIFFICILE BY PCR: Toxigenic C. Difficile by PCR: POSITIVE — AB

## 2016-11-30 MED ORDER — POTASSIUM CHLORIDE IN NACL 20-0.9 MEQ/L-% IV SOLN
INTRAVENOUS | Status: DC
  Administered 2016-11-30 (×2): via INTRAVENOUS
  Filled 2016-11-30 (×4): qty 1000

## 2016-11-30 MED ORDER — POTASSIUM CHLORIDE 10 MEQ/100ML IV SOLN
10.0000 meq | INTRAVENOUS | Status: AC
  Filled 2016-11-30 (×4): qty 100

## 2016-11-30 MED ORDER — VANCOMYCIN 50 MG/ML ORAL SOLUTION
125.0000 mg | Freq: Four times a day (QID) | ORAL | Status: DC
  Administered 2016-11-30 – 2016-12-01 (×3): 125 mg via ORAL
  Filled 2016-11-30 (×4): qty 2.5

## 2016-11-30 MED ORDER — PANTOPRAZOLE SODIUM 40 MG IV SOLR
40.0000 mg | Freq: Two times a day (BID) | INTRAVENOUS | Status: DC
  Administered 2016-11-30 – 2016-12-01 (×4): 40 mg via INTRAVENOUS
  Filled 2016-11-30 (×4): qty 40

## 2016-11-30 MED ORDER — ENSURE ENLIVE PO LIQD
237.0000 mL | Freq: Two times a day (BID) | ORAL | Status: DC
  Administered 2016-11-30 – 2016-12-01 (×2): 237 mL via ORAL

## 2016-11-30 MED ORDER — CILOSTAZOL 100 MG PO TABS
50.0000 mg | ORAL_TABLET | Freq: Two times a day (BID) | ORAL | Status: DC
  Administered 2016-11-30 – 2016-12-01 (×4): 50 mg via ORAL
  Filled 2016-11-30 (×4): qty 1

## 2016-11-30 MED ORDER — ORAL CARE MOUTH RINSE: 15.0000 mL | Freq: Two times a day (BID) | OROMUCOSAL | Status: DC

## 2016-11-30 MED ORDER — ATORVASTATIN CALCIUM 20 MG PO TABS
20.0000 mg | ORAL_TABLET | Freq: Every day | ORAL | Status: DC
  Administered 2016-11-30: 20 mg via ORAL
  Filled 2016-11-30: qty 1

## 2016-11-30 MED ORDER — CALCIUM GLUCONATE 10 % IV SOLN
1.0000 g | Freq: Once | INTRAVENOUS | Status: AC
  Administered 2016-11-30: 1 g via INTRAVENOUS
  Filled 2016-11-30: qty 10

## 2016-11-30 MED ORDER — ONDANSETRON HCL 4 MG PO TABS: 4.0000 mg | ORAL_TABLET | Freq: Four times a day (QID) | ORAL | Status: DC | PRN

## 2016-11-30 MED ORDER — POTASSIUM CHLORIDE CRYS ER 20 MEQ PO TBCR
20.0000 meq | EXTENDED_RELEASE_TABLET | Freq: Every day | ORAL | Status: DC
  Administered 2016-11-30 – 2016-12-01 (×2): 20 meq via ORAL
  Filled 2016-11-30 (×2): qty 1

## 2016-11-30 MED ORDER — CHLORHEXIDINE GLUCONATE 0.12 % MT SOLN
15.0000 mL | Freq: Two times a day (BID) | OROMUCOSAL | Status: DC
  Administered 2016-11-30 – 2016-12-01 (×4): 15 mL via OROMUCOSAL
  Filled 2016-11-30 (×4): qty 15

## 2016-11-30 MED ORDER — PAROXETINE HCL 20 MG PO TABS
10.0000 mg | ORAL_TABLET | Freq: Every day | ORAL | Status: DC
  Administered 2016-11-30 – 2016-12-01 (×2): 10 mg via ORAL
  Filled 2016-11-30 (×2): qty 1

## 2016-11-30 MED ORDER — ONDANSETRON HCL 4 MG/2ML IJ SOLN: 4.0000 mg | Freq: Four times a day (QID) | INTRAMUSCULAR | Status: DC | PRN

## 2016-11-30 MED ORDER — SODIUM CHLORIDE 0.9% FLUSH
3.0000 mL | Freq: Two times a day (BID) | INTRAVENOUS | Status: DC
  Administered 2016-11-30: 3 mL via INTRAVENOUS

## 2016-11-30 MED ORDER — POTASSIUM CHLORIDE 10 MEQ/100ML IV SOLN
10.0000 meq | INTRAVENOUS | Status: AC
  Administered 2016-11-30 (×6): 10 meq via INTRAVENOUS
  Filled 2016-11-30 (×6): qty 100

## 2016-11-30 MED ORDER — ACETAMINOPHEN 650 MG RE SUPP: 650.0000 mg | Freq: Four times a day (QID) | RECTAL | Status: DC | PRN

## 2016-11-30 MED ORDER — ALPRAZOLAM 0.25 MG PO TABS: 0.2500 mg | ORAL_TABLET | Freq: Every evening | ORAL | Status: DC | PRN

## 2016-11-30 MED ORDER — ACETAMINOPHEN 325 MG PO TABS: 650.0000 mg | ORAL_TABLET | Freq: Four times a day (QID) | ORAL | Status: DC | PRN

## 2016-11-30 MED ORDER — ASPIRIN 81 MG PO CHEW
81.0000 mg | CHEWABLE_TABLET | Freq: Every day | ORAL | Status: DC
  Administered 2016-11-30 – 2016-12-01 (×2): 81 mg via ORAL
  Filled 2016-11-30 (×2): qty 1

## 2016-11-30 NOTE — Progress Notes (Signed)
PROGRESS NOTE  Jennifer BackersSusan L Veron  ZOX:[id number] DOB: 01-Dec-1940 DOA: 11/29/2016 PCP: Jennifer [name]   Brief Narrative: Jennifer BackersSusan L [name] is a 76 y.o. female with medical history significant of tobacco dependence; PVD; HTN; and HLD presenting with dehydration.  Last Thursday she developed diarrhea.  She called PCP Thursday PM and heard back the next day - was instructed to take Imodium.  She had some improvement but continued to have diarrhea.  She took pedialyte but it was too disgusting to drink (she "just barfed it back up").  She was feeling a little bit better but today felt worse and called PCP again.  She felt weak, "real spacey, like I was gonna faint".  She went to Jennifer [name] and she recommended that the patient come in to the ER for IVF.  The patient does take potassium pills but she has not taken them this week because she felt so bad.  No idea what led to the diarrhea.  Started off with 3 stools per day and then with the Imodium just 1 stool and "drips and drabs."  Yesterday she had 1 stool last night and she had 1 small stool this AM about 9AM.  No myalgias, muscle cramps.  Usual BP 118/80 or so.  She took antibiotics about 6 weeks ago and "they didn't agree with me".  [facility] and then went to PCP for f/u on 6/18 and then she stopped the Augmentin due to GI upset.  Assessment & Plan: Principal Problem:   Hypokalemia Active Problems:   SMOKER   Essential hypertension   Hyperglycemia   Diarrhea   Acute renal failure (ARF) (HCC)  Severe hypokalemia: Due to poor per oral intake and GI losses as well as not taking daily supplement.  - Resolved with significant repletion.  - Recheck in AM  Acute renal failure: Scr improving 2.53 > 1.46 with IVF's suggestive of prerenal etiology.  - Continue IVFs and advance diet as below.  - Hold ARB-HCTZ  C. difficile colitis: Still symptomatic. - po vancomycin x14 days - Enteric precautions  Occult +FOBT: History of hemorrhoids.  Hgb stable with no evidence of bleeding. - PPI, follow up with GI as outpatient  HTN -Hold Norvasc due to hypotension related to dehydration -Hold Losartan-HCTZ as above  Tobacco dependence -Encourage cessation.  -Patch declined by patient.  DVT prophylaxis: SCDs Code Status: DNR Family Communication: Sister at bedside Disposition Plan: Anticipate DC to home in 24-48 hours once able to maintain hydration by mouth, GI losses improve and electrolytes stable. Still requiring IVF's.   Consultants:   None  Procedures:   None  Antimicrobials:  Vancomycin po 8/9 x 14 days   Subjective: Still with some watery stools, though N/V improved, not recurred with full liquid diet. No fever.   Objective: Vitals:   11/29/16 2300 11/30/16 0100 11/30/16 0629 11/30/16 1244  BP: (!) 81/61 (!) 88/59 (!) 89/62 93/63  Pulse:  83 74 80  Resp: 20 20 18 18   Temp:  97.7 F (36.5 C) 98.6 F (37 C) 98.7 F (37.1 C)  TempSrc:   Oral Oral  SpO2:  97%  98%  Weight:  42.6 kg (93 lb 14.7 oz)    Height:  5\' 4"  (1.626 m)      Intake/Output Summary (Last 24 hours) at 11/30/16 1912 Last data filed at 11/30/16 1800  Gross per 24 hour  Intake          3212.92 ml  Output  1400 ml  Net          1812.92 ml   Filed Weights   11/29/16 1746 11/30/16 0100  Weight: 40.4 kg (89 lb) 42.6 kg (93 lb 14.7 oz)    Examination: General exam: 76 y.o. female in no distress Respiratory system: Non-labored breathing room air. Clear to auscultation bilaterally.  Cardiovascular system: Regular rate and rhythm. No murmur, rub, or gallop. No JVD, and no pedal edema. Gastrointestinal system: Abdomen soft, non-tender, non-distended, with normoactive bowel sounds. No organomegaly or masses felt. Central nervous system: Alert and oriented. No focal neurological deficits. Extremities: Warm, no deformities Skin: No rashes, lesions no ulcers. Poor skin turgor.  Psychiatry: Judgement and insight appear normal.  Mood & affect appropriate.   Data Reviewed: I have personally reviewed following labs and imaging studies  CBC:  Recent Labs Lab 11/29/16 1754 11/30/16 0005 11/30/16 0231 11/30/16 1208  WBC 9.3 8.1 6.9 7.6  HGB 13.2 12.2 10.8* 10.8*  HCT 36.0 33.5* 30.2* 30.2*  MCV 81.6 82.1 82.3 84.4  PLT 327 307 296 304   Basic Metabolic Panel:  Recent Labs Lab 11/29/16 1754 11/30/16 0005 11/30/16 0231 11/30/16 1208  NA 130* 130* 132* 135  K 2.1* 2.2* 2.4* 3.5  CL 93* 98* 101 106  CO2 21* 19* 19* 19*  GLUCOSE 152* 100* 96 77  BUN 63* 59* 60* 48*  CREATININE 2.53* 2.18* 1.95* 1.46*  CALCIUM 8.1* 7.2* 6.9* 7.1*  MG  --   --   --  2.0   GFR: Estimated Creatinine Clearance: 21.4 mL/min (A) (by C-G formula based on SCr of 1.46 mg/dL (H)). Liver Function Tests:  Recent Labs Lab 11/29/16 1754  AST 22  ALT 15  ALKPHOS 42  BILITOT 0.6  PROT 8.1  ALBUMIN 3.7    Recent Labs Lab 11/29/16 1754  LIPASE 22   No results for input(s): AMMONIA in the last 168 hours. Coagulation Profile: No results for input(s): INR, PROTIME in the last 168 hours. Cardiac Enzymes: No results for input(s): CKTOTAL, CKMB, CKMBINDEX, TROPONINI in the last 168 hours. BNP (last 3 results) No results for input(s): PROBNP in the last 8760 hours. HbA1C: No results for input(s): HGBA1C in the last 72 hours. CBG: No results for input(s): GLUCAP in the last 168 hours. Lipid Profile: No results for input(s): CHOL, HDL, LDLCALC, TRIG, CHOLHDL, LDLDIRECT in the last 72 hours. Thyroid Function Tests: No results for input(s): TSH, T4TOTAL, FREET4, T3FREE, THYROIDAB in the last 72 hours. Anemia Panel: No results for input(s): VITAMINB12, FOLATE, FERRITIN, TIBC, IRON, RETICCTPCT in the last 72 hours. Urine analysis:    Component Value Date/Time   COLORURINE YELLOW 04/21/2015 1135   APPEARANCEUR CLEAR 04/21/2015 1135   LABSPEC <=1.005 (A) 04/21/2015 1135   PHURINE 6.5 04/21/2015 1135   GLUCOSEU NEGATIVE  04/21/2015 1135   HGBUR LARGE (A) 04/21/2015 1135   BILIRUBINUR NEGATIVE 04/21/2015 1135   KETONESUR NEGATIVE 04/21/2015 1135   UROBILINOGEN 0.2 04/21/2015 1135   NITRITE NEGATIVE 04/21/2015 1135   LEUKOCYTESUR MODERATE (A) 04/21/2015 1135   Recent Results (from the past 240 hour(s))  C difficile quick scan w PCR reflex     Status: Abnormal   Collection Time: 11/30/16  6:05 AM  Result Value Ref Range Status   C Diff antigen POSITIVE (A) NEGATIVE Final   C Diff toxin NEGATIVE NEGATIVE Final   C Diff interpretation Results are indeterminate. See PCR results.  Final  Clostridium Difficile by PCR     Status:  Abnormal   Collection Time: 11/30/16  6:05 AM  Result Value Ref Range Status   Toxigenic C Difficile by pcr POSITIVE (A) NEGATIVE Final    Comment: Positive for toxigenic C. difficile with little to no toxin production. Only treat if clinical presentation suggests symptomatic illness. Performed at [facility], [address] [place], [place] [address]       Radiology Studies: No results found.  Scheduled Meds: . aspirin  81 mg Oral QAC breakfast  . atorvastatin  20 mg Oral q1800  . chlorhexidine  15 mL Mouth Rinse BID  . cilostazol  50 mg Oral BID  . feeding supplement (ENSURE ENLIVE)  237 mL Oral BID BM  . mouth rinse  15 mL Mouth Rinse q12n4p  . pantoprazole (PROTONIX) IV  40 mg Intravenous Q12H  . PARoxetine  10 mg Oral Daily  . potassium chloride SA  20 mEq Oral Daily  . sodium chloride flush  3 mL Intravenous Q12H   Continuous Infusions: . 0.9 % NaCl with KCl 20 mEq / L 125 mL/hr at 11/30/16 1236     LOS: 0 days   Time spent: 25 minutes.  Hazeline Junker, [name] Pager [phone]  If 7PM-7AM, please contact night-coverage [email or web address] Password [email or web address] 11/30/2016, 7:12 PM

## 2016-11-30 NOTE — Progress Notes (Signed)
Initial Nutrition Assessment  DOCUMENTATION CODES:   Underweight  INTERVENTION:   Ensure Enlive po BID, each supplement provides 350 kcal and 20 grams of protein  NUTRITION DIAGNOSIS:   Inadequate oral intake related to acute illness, nausea, vomiting as evidenced by NPO status.  -pt advanced to full liquids today  GOAL:   Patient will meet greater than or equal to 90% of their needs  MONITOR:   PO intake, Supplement acceptance, Labs, Weight trends  REASON FOR ASSESSMENT:   Malnutrition Screening Tool    ASSESSMENT:   76 y.o. female with medical history significant of tobacco dependence; PVD; HTN; and HLD presenting with hypokalemia, dehydration, diarrhea x 1 week, nausea x 3 days, and poor po x 1 week   Met with pt in room today. Pt reports that she is usually a good eater at home but that she has not eaten since last Thursday (8/2). Pt reports that her appetite is coming back today and she is ready to eat. Pt reports diarrhea that started a week ago and nausea that started 3 days ago. Pt denies any nausea or vomiting today but continues to have diarrhea. C-diff pending. Pt reports a usual body weight of 96lbs. Pt has lost 4lbs(4%) in 1 month; RD suspects this is r/t dehydration. Pt advanced to full liquid diet today; RD will order Ensure per pt request. Pt with hypokalemia; monitor and supplement as needed per MD discretion.     Medications reviewed and include: aspirin, protonix, paxil, KCl, NaCl w/ KCl  Labs reviewed: Na 132(L), K 2.4(L), BUN 60(H), creat 1.95(H), Ca 6.9(L)   Nutrition-Focused physical exam completed. Findings are no fat depletion, severe muscle depletions in clavicles, hands, shoulders, and legs, and no edema.   Diet Order:  Diet full liquid Room service appropriate? Yes; Fluid consistency: Thin  Skin:  Reviewed, no issues  Last BM:  8/9- diarrhea   Height:   Ht Readings from Last 1 Encounters:  11/30/16 _0  (1.626 m)    Weight:   Wt  Readings from Last 1 Encounters:  11/30/16 93 lb 14.7 oz (42.6 kg)    Ideal Body Weight:  54.5 kg  BMI:  Body mass index is 16.12 kg/m.  Estimated Nutritional Needs:   Kcal:  1300-1600kcal/day   Protein:  63-72g/day   Fluid:  >1.3L/day   EDUCATION NEEDS:   Education needs addressed  Koleen Distance MS, RD, LDN Pager #[phone] After Hours Pager: [phone]

## 2016-11-30 NOTE — Progress Notes (Signed)
CRITICAL VALUE ALERT  Critical Value:  K 2.2  Date & Time Notied:  11/30/16 0039  Provider Notified: Toniann FailKakrakandy  Orders Received/Actions taken: 10 Meq of K IV X 4

## 2016-11-30 NOTE — Progress Notes (Signed)
CRITICAL VALUE ALERT  Critical Value:  K 2.4  Date & Time Notied:  11/30/16 0415  Provider Notified: Toniann FailKakrakandy  Orders Received/Actions taken: 10 Meq X 6 has already been ordered by Basil DessJ. Yates.

## 2016-12-01 DIAGNOSIS — E876 Hypokalemia: Secondary | ICD-10-CM | POA: Diagnosis not present

## 2016-12-01 DIAGNOSIS — E871 Hypo-osmolality and hyponatremia: Secondary | ICD-10-CM

## 2016-12-01 DIAGNOSIS — N170 Acute kidney failure with tubular necrosis: Secondary | ICD-10-CM | POA: Diagnosis not present

## 2016-12-01 DIAGNOSIS — A0472 Enterocolitis due to Clostridium difficile, not specified as recurrent: Secondary | ICD-10-CM | POA: Diagnosis not present

## 2016-12-01 DIAGNOSIS — E860 Dehydration: Secondary | ICD-10-CM | POA: Diagnosis not present

## 2016-12-01 DIAGNOSIS — I10 Essential (primary) hypertension: Secondary | ICD-10-CM

## 2016-12-01 DIAGNOSIS — F17200 Nicotine dependence, unspecified, uncomplicated: Secondary | ICD-10-CM

## 2016-12-01 LAB — COMPREHENSIVE METABOLIC PANEL
ALT: 13 U/L — AB (ref 14–54)
ANION GAP: 6 (ref 5–15)
AST: 18 U/L (ref 15–41)
Albumin: 2.4 g/dL — ABNORMAL LOW (ref 3.5–5.0)
Alkaline Phosphatase: 32 U/L — ABNORMAL LOW (ref 38–126)
BUN: 28 mg/dL — ABNORMAL HIGH (ref 6–20)
CHLORIDE: 111 mmol/L (ref 101–111)
CO2: 20 mmol/L — AB (ref 22–32)
CREATININE: 0.8 mg/dL (ref 0.44–1.00)
Calcium: 7.5 mg/dL — ABNORMAL LOW (ref 8.9–10.3)
Glucose, Bld: 97 mg/dL (ref 65–99)
POTASSIUM: 3.3 mmol/L — AB (ref 3.5–5.1)
SODIUM: 137 mmol/L (ref 135–145)
Total Bilirubin: 0.8 mg/dL (ref 0.3–1.2)
Total Protein: 5.4 g/dL — ABNORMAL LOW (ref 6.5–8.1)

## 2016-12-01 LAB — URINE CULTURE: CULTURE: NO GROWTH

## 2016-12-01 LAB — CBC
HCT: 28.8 % — ABNORMAL LOW (ref 36.0–46.0)
HEMOGLOBIN: 10.2 g/dL — AB (ref 12.0–15.0)
MCH: 29.6 pg (ref 26.0–34.0)
MCHC: 35.4 g/dL (ref 30.0–36.0)
MCV: 83.5 fL (ref 78.0–100.0)
PLATELETS: 306 10*3/uL (ref 150–400)
RBC: 3.45 MIL/uL — AB (ref 3.87–5.11)
RDW: 13.9 % (ref 11.5–15.5)
WBC: 7.6 10*3/uL (ref 4.0–10.5)

## 2016-12-01 MED ORDER — VANCOMYCIN HCL 125 MG PO CAPS: 125.0000 mg | ORAL_CAPSULE | Freq: Four times a day (QID) | ORAL | 0 refills | Status: DC

## 2016-12-01 MED ORDER — ESOMEPRAZOLE MAGNESIUM 20 MG PO CPDR: 20.0000 mg | DELAYED_RELEASE_CAPSULE | Freq: Every day | ORAL | 0 refills | Status: DC

## 2016-12-01 NOTE — Discharge Summary (Signed)
Physician Discharge Summary  Jennifer [name] ZOX:[id number] DOB: 1940-11-14 DOA: 11/29/2016  PCP: Pincus Sanes, MD  Admit date: 11/29/2016 Discharge date: 12/01/2016  Admitted From: Home Disposition: Home   Recommendations for Outpatient Follow-up:  1. Follow up with PCP in 1-2 weeks 2. Smoking cessation counseling 3. Monitor BMP 4. Monitor BP. Discharged with directions to hold losartan-HCTZ as she has had low-normal BPs while holding this in the hospital.   Home Health: None Equipment/Devices: None Discharge Condition: Stable CODE STATUS: DNR Diet recommendation: Heart healthy  Brief/Interim Summary: Jennifer [name] a 76 y.o.femalewith medical history significant of tobacco dependence; PVD; HTN; and HLD presenting with dehydration. Last Thursday she developed diarrhea. She called PCP Thursday PM and heard back the next day - was instructed to take Imodium. She had some improvement but continued to have diarrhea. She took pedialyte but it was too disgusting to drink (she "just barfed it back up"). She was feeling a little bit better but today felt worse and called PCP again. She felt weak, "real spacey, like I was gonna faint". She went to Dr. Lawerance Bach and she recommended that the patient come in to the ER for IVF. The patient does take potassium pills but she has not taken them this week because she felt so bad. No idea what led to the diarrhea. Started off with 3 stools per day and then with the Imodium just 1 stool and "drips and drabs." Yesterday she had 1 stool last night and she had 1 small stool this AM about 9AM. No myalgias, muscle cramps. Usual BP 118/80 or so.  She took antibiotics about 6 weeks ago and "they didn't agree with me". She was seen at [facility] and then went to PCP for f/u on 6/18and then she stopped the Augmentindue to GI upset.  Discharge Diagnoses:  Principal Problem:   Hypokalemia Active Problems:   SMOKER   Essential hypertension  Hyperglycemia   Diarrhea   Acute renal failure (ARF) (HCC)  Severe hypokalemia: Due to poor per oral intake and GI losses, diuretic, as well as not taking daily supplement.  - Improved with significant repletion, will advise to continue this at home  Acute renal failure: Scr improving 2.53 > 0.8 with IVF's suggesting prerenal etiology/dehydration.  - Resolved.  - Held ARB-HCTZ for this and BPs have remained low. Will continue to hold this until PCP follow up  C. difficile colitis: Symptoms resolved - po vancomycin x14 days - Discussed universal precautions at home.  Occult +FOBT: History of hemorrhoids. Hgb stable with no evidence of bleeding. - Started PPI with improvement in heartburn symptoms, follow up with GI as outpatient  HTN - Restart norvasc. - Will continue to hold Losartan-HCTZ as above  Tobacco dependence -Encourage cessation.  -Patch declined by patient.  Discharge Instructions Discharge Instructions    Discharge instructions    Complete by:  As directed    You were admitted with CDiff and improved with antibiotics. You are stable for discharge with the following recommendations:  - Continue taking vancomycin four times daily for the entire duration, to complete 14 days of therapy.  - Restart your potassium supplement daily - Stop taking losartan-HCTZ until you follow up with your primary care provider.  - If your symptoms return, seek medical attention right away.   Increase activity slowly    Complete by:  As directed      Allergies as of 12/01/2016   No Known Allergies     Medication List  STOP taking these medications   losartan-hydrochlorothiazide 50-12.5 MG tablet Commonly known as:  HYZAAR     TAKE these medications   ALPRAZolam 0.25 MG tablet Commonly known as:  XANAX TAKE 1 TABLET BY MOUTH AT BEDTIME AS NEEDED FOR ANXIETY   amLODipine 5 MG tablet Commonly known as:  NORVASC TAKE 1 TABLET BY MOUTH EVERY NIGHT AT BEDTIME   aspirin  81 MG tablet Take 81 mg by mouth daily before breakfast.   atorvastatin 20 MG tablet Commonly known as:  LIPITOR TAKE 1 TABLET BY MOUTH ONCE DAILY   calcium-vitamin D 500-200 MG-UNIT tablet Commonly known as:  OSCAL WITH D Take 2 tablets by mouth daily.   cilostazol 50 MG tablet Commonly known as:  PLETAL Take 1 tablet (50 mg total) by mouth 2 (two) times daily.   esomeprazole 20 MG capsule Commonly known as:  NEXIUM Take 1 capsule (20 mg total) by mouth daily.   PARoxetine 10 MG tablet Commonly known as:  PAXIL TAKE 1 TABLET BY MOUTH ONCE DAILY   potassium chloride SA 20 MEQ tablet Commonly known as:  K-DUR,KLOR-CON Take 1 tablet (20 mEq total) by mouth daily.   vancomycin 125 MG capsule Commonly known as:  VANCOCIN Take 1 capsule (125 mg total) by mouth 4 (four) times daily.      Follow-up Information    Pincus Sanes, MD. Schedule an appointment as soon as possible for a visit in 1 week(s).   Specialty:  Internal Medicine Contact information: [address] [place] [address] [phone]          No Known Allergies  Consultations:  None  Procedures/Studies:  None  Subjective: Feels much better. No N/V, last BM was formed this morning. No abd pain or fever. Tolerating solid food diet.  Discharge Exam: Vitals:   11/30/16 2028 12/01/16 0602  BP: 96/70 110/65  Pulse: 78 72  Resp: 18 18  Temp: 98.5 F (36.9 C) 98.5 F (36.9 C)  SpO2: 95% 98%   General: Pt is alert, awake, not in acute distress Cardiovascular: RRR, S1/S2 +, no rubs, no gallops Respiratory: CTA bilaterally, no wheezing, no rhonchi Abdominal: Soft, NT, ND, bowel sounds + Extremities: No edema, no cyanosis  Labs: BNP (last 3 results) No results for input(s): BNP in the last 8760 hours. Basic Metabolic Panel:  Recent Labs Lab 11/29/16 1754 11/30/16 0005 11/30/16 0231 11/30/16 1208 12/01/16 0430  NA 130* 130* 132* 135 137  K 2.1* 2.2* 2.4* 3.5 3.3*  CL 93* 98* 101  106 111  CO2 21* 19* 19* 19* 20*  GLUCOSE 152* 100* 96 77 97  BUN 63* 59* 60* 48* 28*  CREATININE 2.53* 2.18* 1.95* 1.46* 0.80  CALCIUM 8.1* 7.2* 6.9* 7.1* 7.5*  MG  --   --   --  2.0  --    Liver Function Tests:  Recent Labs Lab 11/29/16 1754 12/01/16 0430  AST 22 18  ALT 15 13*  ALKPHOS 42 32*  BILITOT 0.6 0.8  PROT 8.1 5.4*  ALBUMIN 3.7 2.4*    Recent Labs Lab 11/29/16 1754  LIPASE 22   No results for input(s): AMMONIA in the last 168 hours. CBC:  Recent Labs Lab 11/29/16 1754 11/30/16 0005 11/30/16 0231 11/30/16 1208 12/01/16 0430  WBC 9.3 8.1 6.9 7.6 7.6  HGB 13.2 12.2 10.8* 10.8* 10.2*  HCT 36.0 33.5* 30.2* 30.2* 28.8*  MCV 81.6 82.1 82.3 84.4 83.5  PLT 327 307 296 304 306   Cardiac  Enzymes: No results for input(s): CKTOTAL, CKMB, CKMBINDEX, TROPONINI in the last 168 hours. BNP: Invalid input(s): POCBNP CBG: No results for input(s): GLUCAP in the last 168 hours. D-Dimer No results for input(s): DDIMER in the last 72 hours. Hgb A1c No results for input(s): HGBA1C in the last 72 hours. Lipid Profile No results for input(s): CHOL, HDL, LDLCALC, TRIG, CHOLHDL, LDLDIRECT in the last 72 hours. Thyroid function studies No results for input(s): TSH, T4TOTAL, T3FREE, THYROIDAB in the last 72 hours.  Invalid input(s): FREET3 Anemia work up No results for input(s): VITAMINB12, FOLATE, FERRITIN, TIBC, IRON, RETICCTPCT in the last 72 hours. Urinalysis    Component Value Date/Time   COLORURINE YELLOW 04/21/2015 1135   APPEARANCEUR CLEAR 04/21/2015 1135   LABSPEC <=1.005 (A) 04/21/2015 1135   PHURINE 6.5 04/21/2015 1135   GLUCOSEU NEGATIVE 04/21/2015 1135   HGBUR LARGE (A) 04/21/2015 1135   BILIRUBINUR NEGATIVE 04/21/2015 1135   KETONESUR NEGATIVE 04/21/2015 1135   UROBILINOGEN 0.2 04/21/2015 1135   NITRITE NEGATIVE 04/21/2015 1135   LEUKOCYTESUR MODERATE (A) 04/21/2015 1135    Microbiology Recent Results (from the past 240 hour(s))  Culture,  Urine     Status: None   Collection Time: 11/30/16  2:36 AM  Result Value Ref Range Status   Specimen Description URINE, CLEAN CATCH  Final   Special Requests NONE  Final   Culture   Final    NO GROWTH Performed at [facility], [address] [place], [place] [address]    Report Status 12/01/2016 FINAL  Final  C difficile quick scan w PCR reflex     Status: Abnormal   Collection Time: 11/30/16  6:05 AM  Result Value Ref Range Status   C Diff antigen POSITIVE (A) NEGATIVE Final   C Diff toxin NEGATIVE NEGATIVE Final   C Diff interpretation Results are indeterminate. See PCR results.  Final  Clostridium Difficile by PCR     Status: Abnormal   Collection Time: 11/30/16  6:05 AM  Result Value Ref Range Status   Toxigenic C Difficile by pcr POSITIVE (A) NEGATIVE Final    Comment: Positive for toxigenic C. difficile with little to no toxin production. Only treat if clinical presentation suggests symptomatic illness. Performed at [facility], [address] [place], [place] [address]     Time coordinating discharge: Approximately 40 minutes  Hazeline Junkeryan Faven Watterson, MD  [facility] 12/01/2016, 1:13 PM Pager [phone]

## 2016-12-01 NOTE — Plan of Care (Signed)
Problem: Activity: Goal: Risk for activity intolerance will decrease Outcome: Progressing Pt reports feeling less fatigue today

## 2016-12-07 NOTE — Patient Instructions (Addendum)
  Test(s) ordered today. Your results will be released to MyChart (or called to you) after review, usually within 72hours after test completion. If any changes need to be made, you will be notified at that same time.  Monitor your BP at home - goal is < 140/90.  Medications reviewed and updated.  Changes include trying zantac once nexium prescription is complete.  Revise diet.  Call if you have heartburn symptoms.   Your prescription(s) have been submitted to your pharmacy. Please take as directed and contact our office if you believe you are having problem(s) with the medication(s).

## 2016-12-07 NOTE — Progress Notes (Signed)
Subjective:    Patient ID: Jennifer [name], female    DOB: Dec 11, 1940, 76 y.o.   MRN: [id number]  HPI The patient is here for follow up from the hospital.      I saw her on 11/29/16 for diarrhea.  She had the diarrhea for one week.  It was water, non bloody and she was having multiple bowel movements a day.  She had mild cramping before and after a BM.  She had nausea, dry heaves, decreased appetite and lightheadedness.  She had low BP, appeared weak and dehydrated.  She had been taking her BP medications daily.  She did have antibiotics 6 weeks prior.  I did send her to the ED due to probable dehydration and concern for AKI.  In the ED she had severe hypokalemia, AKI, occult +FOBT and was found to have Cdiff.  She was hydrated and her BP medication was held.  She was advised not to restart her BP medication until following up with me.  Her AKI resolved with IVF.   She was started on Vanco PO x 14 days for Cdiff.  Her Hgb was stable and there was no signs of bleeding.  She was started on a PPI.  It was recommended she follow up with GI as an outpatient.    She has 6 days left of the antibiotic. She is having bowel movements once daily and the stool is soft.  She denies diarrhea.  She denies blood int he stool, abdominal pain, fever/chills.  Her appetite is much better and she thinks it is normal.    Her BP at home yesterday was 122/74.  She is only taking the amlodipine.  She is taking the potassium daily.   She is eating and drinking normally.  It is hard for her to eat a lot - she has trained her self years ago to eat very little and she is trying to eat more.   She is still smoking, but has cut down.   GERD:  She was diagnosed with this in the hospital.  She did not know she had it.  She was prescribed nexium and her symptoms are gone.     Medications and allergies reviewed with patient and updated if appropriate.  Patient Active Problem List   Diagnosis Date Noted  . GERD  (gastroesophageal reflux disease) 12/08/2016  . Acute renal failure (ARF) (HCC) 11/30/2016  . Dehydration 11/29/2016  . C. difficile diarrhea 11/29/2016  . Hypokalemia 11/29/2016  . Prediabetes 11/02/2016  . Hyperglycemia 11/01/2016  . Carotid arterial disease (HCC) 10/20/2015  . Anxiety state   . PAD (peripheral artery disease) (HCC) 10/10/2011  . Osteoporosis, senile 05/17/2011  . Dyslipidemia 05/03/2009  . SMOKER 04/14/2009  . Essential hypertension 04/14/2009    Current Outpatient Prescriptions on File Prior to Visit  Medication Sig Dispense Refill  . ALPRAZolam (XANAX) 0.25 MG tablet TAKE 1 TABLET BY MOUTH AT BEDTIME AS NEEDED FOR ANXIETY 30 tablet 0  . amLODipine (NORVASC) 5 MG tablet TAKE 1 TABLET BY MOUTH EVERY NIGHT AT BEDTIME 90 tablet 2  . aspirin 81 MG tablet Take 81 mg by mouth daily before breakfast.     . atorvastatin (LIPITOR) 20 MG tablet TAKE 1 TABLET BY MOUTH ONCE DAILY 90 tablet 3  . calcium-vitamin D (OSCAL WITH D) 500-200 MG-UNIT per tablet Take 2 tablets by mouth daily.    . cilostazol (PLETAL) 50 MG tablet Take 1 tablet (50 mg total) by mouth 2 (two)  times daily. 180 tablet 3  . esomeprazole (NEXIUM) 20 MG capsule Take 1 capsule (20 mg total) by mouth daily. 30 capsule 0  . PARoxetine (PAXIL) 10 MG tablet TAKE 1 TABLET BY MOUTH ONCE DAILY 90 tablet 0  . potassium chloride SA (K-DUR,KLOR-CON) 20 MEQ tablet Take 1 tablet (20 mEq total) by mouth daily. 90 tablet 3  . vancomycin (VANCOCIN) 125 MG capsule Take 1 capsule (125 mg total) by mouth 4 (four) times daily. 50 capsule 0   No current facility-administered medications on file prior to visit.     Past Medical History:  Diagnosis Date  . ANEMIA-NOS   . DYSLIPIDEMIA   . Hemorrhoids   . HYPERTENSION   . Osteoporosis, senile    DEXA 03/2014: -3.3 R fem  . Peripheral vascular disease, unspecified (HCC) dx 02/2010 ABI  . SMOKER     Past Surgical History:  Procedure Laterality Date  . CATARACT EXTRACTION  W/ INTRAOCULAR LENS IMPLANT Right 03/12/14   groat  . HEMORRHOID SURGERY  05/18/2011   Procedure: HEMORRHOIDECTOMY;  Surgeon: Robyne AskewPaul S Toth III, MD;  Location: [facility];  Service: General;  Laterality: N/A;  hemorrhoidectomy, three columns, internal and external  . TONSILLECTOMY  1946  . TUBAL LIGATION  1979    Social History   Social History  . Marital status: Single    Spouse name: N/A  . Number of children: N/A  . Years of education: N/A   Occupational History  . retired    Social History Main Topics  . Smoking status: Current Every Day Smoker    Packs/day: 0.50    Years: 62.00    Types: Cigarettes  . Smokeless tobacco: Never Used  . Alcohol use 0.0 oz/week     Comment: Social  . Drug use: No  . Sexual activity: Not Asked   Other Topics Concern  . None   Social History Narrative   Single, lives alone. 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    Family History  Problem Relation Age of Onset  . Breast cancer Mother   . Lung cancer Father   . Hypertension Brother   . Hypertension Other     Review of Systems  Constitutional: Negative for appetite change, chills and fever.  Respiratory: Negative for shortness of breath.   Cardiovascular: Negative for chest pain, palpitations and leg swelling.  Gastrointestinal: Negative for abdominal pain, blood in stool, diarrhea and nausea.  Genitourinary: Negative for dysuria and hematuria.  Neurological: Negative for light-headedness and headaches.       Objective:   Vitals:   12/08/16 1306  BP: 128/74  Pulse: 85  Temp: 98 F (36.7 C)  SpO2: 98%   Wt Readings from Last 3 Encounters:  11/30/16 93 lb 14.7 oz (42.6 kg)  11/29/16 89 lb (40.4 kg)  11/01/16 97 lb 8 oz (44.2 kg)   There is no height or weight on file to calculate BMI.   Physical Exam    Constitutional: Appears well-developed and well-nourished. No distress.  HENT:  Head: Normocephalic and atraumatic.  Neck: Neck supple. No tracheal deviation  present. No thyromegaly present.  No cervical lymphadenopathy Cardiovascular: Normal rate, regular rhythm and normal heart sounds.   No murmur heard. No carotid bruit .  No edema Pulmonary/Chest: Effort normal and breath sounds normal. No respiratory distress. No has no wheezes. No rales.  Abdomen:  Soft, non tender, non distended Skin: Skin is warm and dry. Not diaphoretic.  Psychiatric: Normal mood and affect. Behavior is  normal.      Assessment & Plan:    See Problem List for Assessment and Plan of chronic medical problems.

## 2016-12-08 ENCOUNTER — Ambulatory Visit (INDEPENDENT_AMBULATORY_CARE_PROVIDER_SITE_OTHER): Payer: Medicare Other | Admitting: Internal Medicine

## 2016-12-08 ENCOUNTER — Encounter: Payer: Self-pay | Admitting: Internal Medicine

## 2016-12-08 ENCOUNTER — Other Ambulatory Visit (INDEPENDENT_AMBULATORY_CARE_PROVIDER_SITE_OTHER): Payer: Medicare Other

## 2016-12-08 VITALS — BP 128/74 | HR 85 | Temp 98.0°F | Ht 64.0 in

## 2016-12-08 DIAGNOSIS — I10 Essential (primary) hypertension: Secondary | ICD-10-CM

## 2016-12-08 DIAGNOSIS — A0472 Enterocolitis due to Clostridium difficile, not specified as recurrent: Secondary | ICD-10-CM

## 2016-12-08 DIAGNOSIS — N170 Acute kidney failure with tubular necrosis: Secondary | ICD-10-CM

## 2016-12-08 DIAGNOSIS — K219 Gastro-esophageal reflux disease without esophagitis: Secondary | ICD-10-CM | POA: Diagnosis not present

## 2016-12-08 DIAGNOSIS — E876 Hypokalemia: Secondary | ICD-10-CM

## 2016-12-08 DIAGNOSIS — R195 Other fecal abnormalities: Secondary | ICD-10-CM

## 2016-12-08 DIAGNOSIS — E860 Dehydration: Secondary | ICD-10-CM

## 2016-12-08 LAB — COMPREHENSIVE METABOLIC PANEL
ALBUMIN: 3.2 g/dL — AB (ref 3.5–5.2)
ALK PHOS: 42 U/L (ref 39–117)
ALT: 15 U/L (ref 0–35)
AST: 22 U/L (ref 0–37)
BUN: 9 mg/dL (ref 6–23)
CHLORIDE: 101 meq/L (ref 96–112)
CO2: 26 mEq/L (ref 19–32)
Calcium: 8.8 mg/dL (ref 8.4–10.5)
Creatinine, Ser: 0.59 mg/dL (ref 0.40–1.20)
GFR: 104.69 mL/min (ref >60.00)
Glucose, Bld: 81 mg/dL (ref 70–99)
POTASSIUM: 3.7 meq/L (ref 3.5–5.1)
SODIUM: 135 meq/L (ref 135–145)
Total Bilirubin: 0.3 mg/dL (ref 0.2–1.2)
Total Protein: 6.6 g/dL (ref 6.0–8.3)

## 2016-12-08 LAB — CBC WITH DIFFERENTIAL/PLATELET
BASOS PCT: 1.2 % (ref 0.0–3.0)
Basophils Absolute: 0.1 10*3/uL (ref 0.0–0.1)
EOS PCT: 1 % (ref 0.0–5.0)
Eosinophils Absolute: 0.1 10*3/uL (ref 0.0–0.7)
HEMATOCRIT: 33.6 % — AB (ref 36.0–46.0)
HEMOGLOBIN: 11.3 g/dL — AB (ref 12.0–15.0)
LYMPHS PCT: 22.8 % (ref 12.0–46.0)
Lymphs Abs: 2.2 10*3/uL (ref 0.7–4.0)
MCHC: 33.6 g/dL (ref 30.0–36.0)
MCV: 89.9 fl (ref 78.0–100.0)
MONOS PCT: 8.3 % (ref 3.0–12.0)
Monocytes Absolute: 0.8 10*3/uL (ref 0.1–1.0)
Neutro Abs: 6.6 10*3/uL (ref 1.4–7.7)
Neutrophils Relative %: 66.7 % (ref 43.0–77.0)
Platelets: 436 10*3/uL — ABNORMAL HIGH (ref 150.0–400.0)
RBC: 3.74 Mil/uL — AB (ref 3.87–5.11)
RDW: 13.9 % (ref 11.5–15.5)
WBC: 9.9 10*3/uL (ref 4.0–10.5)

## 2016-12-08 MED ORDER — RANITIDINE HCL 150 MG PO TABS: 150.0000 mg | ORAL_TABLET | Freq: Two times a day (BID) | ORAL | 5 refills | Status: DC | PRN

## 2016-12-08 NOTE — Assessment & Plan Note (Signed)
Resolved while in hosptial cmp today

## 2016-12-08 NOTE — Assessment & Plan Note (Signed)
Resolved with IVF in hospital Drinking and eating normally No clinical evidence of dehydration cmp

## 2016-12-08 NOTE — Assessment & Plan Note (Signed)
Off losartan-hctz due to AKI from dehydration BP controlled off losartan-hctz Continue amlodipine Monitor at home cmp

## 2016-12-08 NOTE — Assessment & Plan Note (Signed)
Cbc stable Likely from irrigation from diarrhea Check cbc Discussed seeing GI - she deferred now and will think about it

## 2016-12-08 NOTE — Assessment & Plan Note (Signed)
Recheck cmp  

## 2016-12-08 NOTE — Assessment & Plan Note (Signed)
Taking vanco - has 6 days left - will complete Stools improved, but not normal No other symptoms Cbc, cmp today If she she has any diarrhea after completing the antibiotic she will let me know

## 2016-12-08 NOTE — Assessment & Plan Note (Signed)
Put on nexium 20 mg daily - symptoms improved Will try to replace with zantac givne possible long term effects of PPIs Zantac 150 mg BID

## 2016-12-09 ENCOUNTER — Encounter: Payer: Self-pay | Admitting: Internal Medicine

## 2016-12-14 ENCOUNTER — Other Ambulatory Visit: Payer: Self-pay | Admitting: Internal Medicine

## 2016-12-27 ENCOUNTER — Ambulatory Visit (INDEPENDENT_AMBULATORY_CARE_PROVIDER_SITE_OTHER): Payer: Medicare Other | Admitting: Cardiovascular Disease

## 2016-12-27 ENCOUNTER — Telehealth: Payer: Self-pay | Admitting: Internal Medicine

## 2016-12-27 ENCOUNTER — Encounter: Payer: Self-pay | Admitting: Cardiovascular Disease

## 2016-12-27 VITALS — BP 118/60 | HR 92 | Ht 64.0 in | Wt 94.4 lb

## 2016-12-27 DIAGNOSIS — Z720 Tobacco use: Secondary | ICD-10-CM

## 2016-12-27 DIAGNOSIS — R011 Cardiac murmur, unspecified: Secondary | ICD-10-CM

## 2016-12-27 DIAGNOSIS — I739 Peripheral vascular disease, unspecified: Secondary | ICD-10-CM

## 2016-12-27 DIAGNOSIS — Z136 Encounter for screening for cardiovascular disorders: Secondary | ICD-10-CM

## 2016-12-27 DIAGNOSIS — I6523 Occlusion and stenosis of bilateral carotid arteries: Secondary | ICD-10-CM | POA: Diagnosis not present

## 2016-12-27 NOTE — Progress Notes (Signed)
Chief Complaint  Patient presents with  . Follow-up    PAD   History of Present Illness: 76 yo WF with history of PAD, tobacco abuse, HTN and hyperlipidemia who is here today for PV follow up. I saw her as a new pt in November 2011 for evaluation of PAD. Initial non-invasive studies showed chronic total occlusion of the left SFA with a long segment of total occlusion, reconstitution above the knee. There is moderate disease in the right SFA. Her initial ABI in 2012 was moderately reduced at 0.76 on the right and 0.60 on the left. I recommended that we pursue a diagnostic angiogram if she wished but she refused. Most recent non-invasive studies June 2018 with May 2017 with right ABI 0.85 and left ABI 0.83. Carotid artery dopplers June 2018 with 40-59% LICA, 0-39% RICA stenosis. She has been on ASA and Pletal.   She is here today for follow up. The patient denies any chest pain, dyspnea, palpitations, lower extremity edema, orthopnea, PND, dizziness, near syncope or syncope. Recent episode of C.diff. Much improved. She has pain in both legs after walking 300 feet. No rest pain or ulcerations.   Primary Care Physician: Pincus Sanes, MD   Past Medical History:  Diagnosis Date  . ANEMIA-NOS   . DYSLIPIDEMIA   . Hemorrhoids   . HYPERTENSION   . Osteoporosis, senile    DEXA 03/2014: -3.3 R fem  . Peripheral vascular disease, unspecified (HCC) dx 02/2010 ABI  . SMOKER     Past Surgical History:  Procedure Laterality Date  . CATARACT EXTRACTION W/ INTRAOCULAR LENS IMPLANT Right 03/12/14   groat  . HEMORRHOID SURGERY  05/18/2011   Procedure: HEMORRHOIDECTOMY;  Surgeon: Robyne Askew, MD;  Location: [facility];  Service: General;  Laterality: N/A;  hemorrhoidectomy, three columns, internal and external  . TONSILLECTOMY  1946  . TUBAL LIGATION  1979    Current Outpatient Prescriptions  Medication Sig Dispense Refill  . ALPRAZolam (XANAX) 0.25 MG tablet TAKE 1 TABLET BY MOUTH AT BEDTIME AS  NEEDED FOR ANXIETY 30 tablet 0  . amLODipine (NORVASC) 5 MG tablet TAKE 1 TABLET BY MOUTH EVERY NIGHT AT BEDTIME 90 tablet 2  . aspirin 81 MG tablet Take 81 mg by mouth daily before breakfast.     . atorvastatin (LIPITOR) 20 MG tablet TAKE 1 TABLET BY MOUTH ONCE DAILY 90 tablet 3  . calcium-vitamin D (OSCAL WITH D) 500-200 MG-UNIT per tablet Take 2 tablets by mouth daily.    . cilostazol (PLETAL) 50 MG tablet Take 1 tablet (50 mg total) by mouth 2 (two) times daily. 180 tablet 3  . esomeprazole (NEXIUM) 20 MG capsule Take 1 capsule (20 mg total) by mouth daily. 30 capsule 0  . PARoxetine (PAXIL) 10 MG tablet TAKE 1 TABLET BY MOUTH ONCE DAILY 90 tablet 0  . potassium chloride SA (K-DUR,KLOR-CON) 20 MEQ tablet Take 1 tablet (20 mEq total) by mouth daily. 90 tablet 3  . ranitidine (ZANTAC) 150 MG tablet Take 1 tablet (150 mg total) by mouth 2 (two) times daily as needed for heartburn. 60 tablet 5   No current facility-administered medications for this visit.     No Known Allergies  Social History   Social History  . Marital status: Single    Spouse name: N/A  . Number of children: N/A  . Years of education: N/A   Occupational History  . retired    Social History Main Topics  . Smoking status:  Current Every Day Smoker    Packs/day: 0.50    Years: 62.00    Types: Cigarettes  . Smokeless tobacco: Never Used  . Alcohol use 0.0 oz/week     Comment: Social  . Drug use: No  . Sexual activity: Not on file   Other Topics Concern  . Not on file   Social History Narrative   Single, lives alone. Retired Pensions consultant. Enjoys Agricultural consultant work.    Family History  Problem Relation Age of Onset  . Breast cancer Mother   . Lung cancer Father   . Hypertension Brother   . Hypertension Other     Review of Systems:  As stated in the HPI and otherwise negative.   BP 118/60   Pulse 92   Ht 5\' 4"  (1.626 m)   Wt 94 lb 6.4 oz (42.8 kg)   SpO2 95%   BMI 16.20 kg/m   Physical  Examination:  General: Well developed, well nourished, NAD  HEENT: OP clear, mucus membranes moist  SKIN: warm, dry. No rashes. Neuro: No focal deficits  Musculoskeletal: Muscle strength 5/5 all ext  Psychiatric: Mood and affect normal  Neck: No JVD, no carotid bruits, no thyromegaly, no lymphadenopathy.  Lungs:Clear bilaterally, no wheezes, rhonci, crackles Cardiovascular: Regular rate and rhythm. Soft systolic murmur. No gallops or rubs. Abdomen:Soft. Bowel sounds present. Non-tender.  Extremities: No lower extremity edema. Pulses are non-palpable in the bilateral DP/PT.  EKG:  EKG is not ordered today. The ekg ordered today demonstrates   Recent Labs: 05/08/2016: TSH 2.86 11/30/2016: Magnesium 2.0 12/08/2016: ALT 15; BUN 9; Creatinine, Ser 0.59; Hemoglobin 11.3; Platelets 436.0; Potassium 3.7; Sodium 135   Lipid Panel    Component Value Date/Time   CHOL 196 05/08/2016 0816   TRIG 87.0 05/08/2016 0816   HDL 81.60 05/08/2016 0816   CHOLHDL 2 05/08/2016 0816   VLDL 17.4 05/08/2016 0816   LDLCALC 97 05/08/2016 0816   LDLDIRECT 232.1 08/09/2009 1125     Wt Readings from Last 3 Encounters:  12/27/16 94 lb 6.4 oz (42.8 kg)  11/30/16 93 lb 14.7 oz (42.6 kg)  11/29/16 89 lb (40.4 kg)     Other studies Reviewed: Additional studies/ records that were reviewed today include: . Review of the above records demonstrates:    Assessment and Plan:   1. PAD:  Stable by dopplers June 2018. She has stable claudication. Will continue ASA and Pletal. Repeat ABI June 2019.   2. Carotid artery disease: Stable moderate LICA stenosis and mild RICA stenosis by dopplers June 2018. Repeat in June 2019.     3. Tobacco abuse: smoking cessation advised.   4. Cardiac murmur: She has a systolic murmur. Will arrange echo to assess.   5. Cardiovascular screening: She has been followed in my office for her PAD (LE disease and carotid disease). She has had no previous cardiac screening except EKGs.  Given her age, ongoing tobacco abuse and known PAD, she has a high probability of CAD, although she has no signs or symptoms of this. We discussed stress testing but she wishes to hold off on this for now. Will discuss at next visit  Current medicines are reviewed at length with the patient today.  The patient does not have concerns regarding medicines.  The following changes have been made:  no change  Labs/ tests ordered today include:   Orders Placed This Encounter  Procedures  . ECHOCARDIOGRAM COMPLETE    Disposition:   FU with me in 12  months  Signed, Verne Carrowhristopher McAlhany, MD 12/27/2016 3:46 PM    [facility] [address], [place], [place]  [address] Phone: [phone]; Fax: [phone]

## 2016-12-27 NOTE — Telephone Encounter (Signed)
Pt called in and would like you give her a call when you get a chance about her 2nd prolia

## 2016-12-27 NOTE — Patient Instructions (Signed)
Medication Instructions:  Your physician recommends that you continue on your current medications as directed. Please refer to the Current Medication list given to you today.   Labwork: none  Testing/Procedures: Your physician has requested that you have an echocardiogram. Echocardiography is a painless test that uses sound waves to create images of your heart. It provides your doctor with information about the size and shape of your heart and how well your heart's chambers and valves are working. This procedure takes approximately one hour. There are no restrictions for this procedure.  Your physician has requested that you have an ankle brachial index (ABI). During this test an ultrasound and blood pressure cuff are used to evaluate the arteries that supply the arms and legs with blood. Allow thirty minutes for this exam. There are no restrictions or special instructions.  To be done in June 2019 (after June 4)  Your physician has requested that you have a carotid duplex. This test is an ultrasound of the carotid arteries in your neck. It looks at blood flow through these arteries that supply the brain with blood. Allow one hour for this exam. There are no restrictions or special instructions. To be done in June 2019 (after June 4)    Follow-Up: Your physician recommends that you schedule a follow-up appointment in: 12 months. Please call our office in about 9 months to schedule this appointment.     Any Other Special Instructions Will Be Listed Below (If Applicable).     If you need a refill on your cardiac medications before your next appointment, please call your pharmacy.

## 2016-12-27 NOTE — Telephone Encounter (Signed)
Left message advising patient that I should be hearing from her insurance company late tomorrow afternoon (9/6)---she can get the 2nd injection either on/after 02/10/17 after I find out if theres a copay, etc.---can talk with tamara if any further questions

## 2017-01-03 ENCOUNTER — Other Ambulatory Visit: Payer: Self-pay

## 2017-01-03 ENCOUNTER — Ambulatory Visit (HOSPITAL_COMMUNITY): Payer: Medicare Other | Attending: Cardiovascular Disease

## 2017-01-03 DIAGNOSIS — R011 Cardiac murmur, unspecified: Secondary | ICD-10-CM | POA: Diagnosis not present

## 2017-01-03 DIAGNOSIS — I061 Rheumatic aortic insufficiency: Secondary | ICD-10-CM | POA: Diagnosis not present

## 2017-01-03 DIAGNOSIS — Z136 Encounter for screening for cardiovascular disorders: Secondary | ICD-10-CM | POA: Diagnosis not present

## 2017-01-03 DIAGNOSIS — I5030 Unspecified diastolic (congestive) heart failure: Secondary | ICD-10-CM | POA: Insufficient documentation

## 2017-01-09 ENCOUNTER — Other Ambulatory Visit: Payer: Self-pay | Admitting: Internal Medicine

## 2017-01-09 NOTE — Telephone Encounter (Signed)
Called refill into [facility] left on pharmacy vm.../lmb 

## 2017-01-09 NOTE — Telephone Encounter (Signed)
Ok to fill 

## 2017-01-09 NOTE — Telephone Encounter (Signed)
Check [place] registry last filled 11/20/2016.Marland KitchenRaechel Chute

## 2017-01-10 ENCOUNTER — Telehealth: Payer: Self-pay | Admitting: Internal Medicine

## 2017-01-10 NOTE — Telephone Encounter (Signed)
Pt called in and said that she was in the hospital for C Dif and she is starting to have diarrhea again and would like a nurse to call her.  She would like to know weather she needs to come in or what she should do?

## 2017-01-11 NOTE — Telephone Encounter (Signed)
Called pt she states she rather come in for appt just to f/u w/MD had C-diff back in August. Been having some diarrhea since Sunday, but not like before. Made appt for tomorrow @ 1:00...Raechel Chute

## 2017-01-11 NOTE — Progress Notes (Signed)
Subjective:    Patient ID: Jennifer [name], female    DOB: 1940/05/06, 76 y.o.   MRN: [id number]  HPI She is here for an acute visit.   Diarrhea:  She had cdiff last month and was successfully treated.  5 times this past week she has woken up and had brown colored diarrhea once only.  She does not have any other bowel movements.  She denies abdominal pain or cramping.  She denies fever/chills or change in appetite.  No blood in the stool.  She overall feels good.  She was concerned this was the beginning of the cdiff coming back.   She denies changes in her diet.    Medications and allergies reviewed with patient and updated if appropriate.  Patient Active Problem List   Diagnosis Date Noted  . GERD (gastroesophageal reflux disease) 12/08/2016  . Fecal occult blood test positive 12/08/2016  . Acute renal failure (ARF) (HCC) 11/30/2016  . Dehydration 11/29/2016  . C. difficile diarrhea 11/29/2016  . Hypokalemia 11/29/2016  . Prediabetes 11/02/2016  . Hyperglycemia 11/01/2016  . Carotid arterial disease (HCC) 10/20/2015  . Anxiety state   . PAD (peripheral artery disease) (HCC) 10/10/2011  . Osteoporosis, senile 05/17/2011  . Dyslipidemia 05/03/2009  . SMOKER 04/14/2009  . Essential hypertension 04/14/2009    Current Outpatient Prescriptions on File Prior to Visit  Medication Sig Dispense Refill  . ALPRAZolam (XANAX) 0.25 MG tablet TAKE 1 TABLET BY MOUTH AT BEDTIME AS NEEDED FOR ANXIETY 30 tablet 0  . amLODipine (NORVASC) 5 MG tablet TAKE 1 TABLET BY MOUTH EVERY NIGHT AT BEDTIME 90 tablet 2  . aspirin 81 MG tablet Take 81 mg by mouth daily before breakfast.     . atorvastatin (LIPITOR) 20 MG tablet TAKE 1 TABLET BY MOUTH ONCE DAILY 90 tablet 3  . calcium-vitamin D (OSCAL WITH D) 500-200 MG-UNIT per tablet Take 2 tablets by mouth daily.    . cilostazol (PLETAL) 50 MG tablet Take 1 tablet (50 mg total) by mouth 2 (two) times daily. 180 tablet 3  . esomeprazole (NEXIUM) 20 MG  capsule Take 1 capsule (20 mg total) by mouth daily. 30 capsule 0  . PARoxetine (PAXIL) 10 MG tablet TAKE 1 TABLET BY MOUTH ONCE DAILY 90 tablet 0  . potassium chloride SA (K-DUR,KLOR-CON) 20 MEQ tablet Take 1 tablet (20 mEq total) by mouth daily. 90 tablet 3  . ranitidine (ZANTAC) 150 MG tablet Take 1 tablet (150 mg total) by mouth 2 (two) times daily as needed for heartburn. 60 tablet 5   No current facility-administered medications on file prior to visit.     Past Medical History:  Diagnosis Date  . ANEMIA-NOS   . DYSLIPIDEMIA   . Hemorrhoids   . HYPERTENSION   . Osteoporosis, senile    DEXA 03/2014: -3.3 R fem  . Peripheral vascular disease, unspecified (HCC) dx 02/2010 ABI  . SMOKER     Past Surgical History:  Procedure Laterality Date  . CATARACT EXTRACTION W/ INTRAOCULAR LENS IMPLANT Right 03/12/14   groat  . HEMORRHOID SURGERY  05/18/2011   Procedure: HEMORRHOIDECTOMY;  Surgeon: Robyne Askew, MD;  Location: [facility];  Service: General;  Laterality: N/A;  hemorrhoidectomy, three columns, internal and external  . TONSILLECTOMY  1946  . TUBAL LIGATION  1979    Social History   Social History  . Marital status: Single    Spouse name: N/A  . Number of children: N/A  . Years of  education: N/A   Occupational History  . retired    Social History Main Topics  . Smoking status: Current Every Day Smoker    Packs/day: 0.50    Years: 62.00    Types: Cigarettes  . Smokeless tobacco: Never Used  . Alcohol use 0.0 oz/week     Comment: Social  . Drug use: No  . Sexual activity: Not Asked   Other Topics Concern  . None   Social History Narrative   Single, lives alone. Retired airline attendaPensions consultanticultural consultant work.    Family History  Problem Relation Age of Onset  . Breast cancer Mother   . Lung cancer Father   . Hypertension Brother   . Hypertension Other     Review of Systems  Constitutional: Negative for appetite change, chills, fatigue and fever.    Gastrointestinal: Positive for diarrhea. Negative for abdominal pain, blood in stool and nausea.  Neurological: Negative for headaches.       Objective:   Vitals:   01/12/17 1304  BP: 140/80  Pulse: 100  Temp: 97.7 F (36.5 C)  SpO2: 99%   Filed Weights   01/12/17 1304  Weight: 94 lb 7 oz (42.8 kg)   Body mass index is 16.21 kg/m.  Wt Readings from Last 3 Encounters:  01/12/17 94 lb 7 oz (42.8 kg)  12/27/16 94 lb 6.4 oz (42.8 kg)  11/30/16 93 lb 14.7 oz (42.6 kg)     Physical Exam  Constitutional: She appears well-developed and well-nourished. No distress.  Abdominal: Soft. She exhibits no distension and no mass. There is no tenderness. There is no rebound and no guarding.  Musculoskeletal: She exhibits no edema.  Skin: Skin is warm and dry. She is not diaphoretic.        Assessment & Plan:   Flu vaccine today  See Problem List for Assessment and Plan of chronic medical problems.

## 2017-01-12 ENCOUNTER — Ambulatory Visit (INDEPENDENT_AMBULATORY_CARE_PROVIDER_SITE_OTHER): Payer: Medicare Other | Admitting: Internal Medicine

## 2017-01-12 ENCOUNTER — Other Ambulatory Visit: Payer: Medicare Other

## 2017-01-12 ENCOUNTER — Encounter: Payer: Self-pay | Admitting: Internal Medicine

## 2017-01-12 VITALS — BP 140/80 | HR 100 | Temp 97.7°F | Ht 64.0 in | Wt 94.4 lb

## 2017-01-12 DIAGNOSIS — R197 Diarrhea, unspecified: Secondary | ICD-10-CM

## 2017-01-12 DIAGNOSIS — Z23 Encounter for immunization: Secondary | ICD-10-CM

## 2017-01-12 NOTE — Patient Instructions (Signed)
If the diarrhea continues or worsens bring in a stool sample.   Start probiotics daily.    Continue your regular diet and stay well hydrated.      A flu vaccine was given today.

## 2017-01-12 NOTE — Assessment & Plan Note (Signed)
This week has had diarrhea most days but only one episode a day - no other concerning symptoms Not likely  c diff, but will take precautions Stool samples if diarrhea persists or worsens Start probiotic No antibiotics at this time Call if diarrhea persists/worsens

## 2017-01-31 DIAGNOSIS — H2512 Age-related nuclear cataract, left eye: Secondary | ICD-10-CM | POA: Diagnosis not present

## 2017-02-12 ENCOUNTER — Telehealth: Payer: Self-pay | Admitting: Internal Medicine

## 2017-02-12 NOTE — Telephone Encounter (Signed)
Pt called and would like to receive her 2nd prolia injection please advise and call back

## 2017-02-13 ENCOUNTER — Ambulatory Visit (INDEPENDENT_AMBULATORY_CARE_PROVIDER_SITE_OTHER): Payer: Medicare Other | Admitting: General Practice

## 2017-02-13 DIAGNOSIS — M810 Age-related osteoporosis without current pathological fracture: Secondary | ICD-10-CM | POA: Diagnosis not present

## 2017-02-13 MED ORDER — DENOSUMAB 60 MG/ML ~~LOC~~ SOLN
60.0000 mg | Freq: Once | SUBCUTANEOUS | Status: AC
  Administered 2017-02-13: 60 mg via SUBCUTANEOUS

## 2017-03-05 ENCOUNTER — Other Ambulatory Visit: Payer: Self-pay | Admitting: Internal Medicine

## 2017-03-06 NOTE — Telephone Encounter (Signed)
[place] Controlled Substance Database checked. Last filled on 01/09/17 

## 2017-03-13 ENCOUNTER — Other Ambulatory Visit: Payer: Self-pay | Admitting: Internal Medicine

## 2017-04-19 ENCOUNTER — Other Ambulatory Visit: Payer: Self-pay | Admitting: Internal Medicine

## 2017-04-19 NOTE — Telephone Encounter (Signed)
[place] Controlled Substance Database checked. Last filled on 03/06/17 

## 2017-05-03 NOTE — Progress Notes (Signed)
Subjective:    Patient ID: Jennifer [name], female    DOB: 02-24-1941, 77 y.o.   MRN: [id number]  HPI Here for medicare wellness exam and a physical exam.   I have personally reviewed and have noted 1.The patient's medical and social history 2.Their use of alcohol, tobacco or illicit drugs 3.Their current medications and supplements 4.The patient's functional ability including ADL's, fall risks, home                 safety risk and hearing or visual impairment. 5.Diet and physical activities 6.Evidence for depression or mood disorders 7.Care team reviewed  -  Cardiology - Dr Clifton James, eye doctor, dentist    Are there smokers in your home (other than you)? No  Risk Factors Exercise: walks dog daily - typically 3/day Dietary issues discussed:  Good amount of fruits/veges, overall well-balanced  Vitamin and supplement use:  Reviewed - taking calcium and vitamin d  Opiod use:  none   Cardiac risk factors: advanced age, hypertension, hyperlipidemia, PAD, prediabetes  Depression Screen  Have you felt down, depressed or hopeless? No  Have you felt little interest or pleasure in doing things?  No  Activities of Daily Living In your present state of health, do you have any difficulty performing the following activities?:  Driving? No Managing money?  No Feeding yourself? No Getting from bed to chair? No Climbing a flight of stairs? No Preparing food and eating?: No Bathing or showering? No Getting dressed: No Getting to/using the toilet? No Moving around from place to place: No In the past year have you fallen or had a near fall?: No   Are you sexually active?  No  Do you have more than one partner?  N/A  Hearing Difficulties: No Do you often ask people to speak up or repeat themselves? No Do you experience ringing or noises in your ears? No Do you have difficulty understanding soft or whispered voices?  No Vision:              Any change in vision:  no             Up to date with eye exam:  yes  Memory:  Do you feel that you have a problem with memory? No  Do you often misplace items? No  Do you feel safe at home?  Yes  Cognitive Testing  Alert, Orientated? Yes  Normal Appearance? Yes  Recall of three objects?  Yes  Can perform simple calculations? Yes  Displays appropriate judgment? Yes  Can read the correct time from a watch face? Yes   Advanced Directives have been discussed with the patient? Yes     Medications and allergies reviewed with patient and updated if appropriate.  Patient Active Problem List   Diagnosis Date Noted  . GERD (gastroesophageal reflux disease) 12/08/2016  . Fecal occult blood test positive 12/08/2016  . Acute renal failure (ARF) (HCC) 11/30/2016  . C. difficile diarrhea 11/29/2016  . Hypokalemia 11/29/2016  . Prediabetes 11/02/2016  . Carotid arterial disease (HCC) 10/20/2015  . Anxiety state   . PAD (peripheral artery disease) (HCC) 10/10/2011  . Osteoporosis, senile 05/17/2011  . Dyslipidemia 05/03/2009  . SMOKER 04/14/2009  . Essential hypertension 04/14/2009    Current Outpatient Medications on File Prior to Visit  Medication Sig Dispense Refill  . ALPRAZolam (XANAX) 0.25 MG tablet TAKE 1 TABLET BY MOUTH EVERY NIGHT AT BEDTIME AS NEEDED FOR ANXIETY OR SLEEP 30 tablet 0  .  amLODipine (NORVASC) 5 MG tablet TAKE 1 TABLET BY MOUTH EVERY NIGHT AT BEDTIME 90 tablet 1  . aspirin 81 MG tablet Take 81 mg by mouth daily before breakfast.     . atorvastatin (LIPITOR) 20 MG tablet TAKE 1 TABLET BY MOUTH ONCE DAILY 90 tablet 3  . calcium-vitamin D (OSCAL WITH D) 500-200 MG-UNIT per tablet Take 2 tablets by mouth daily.    . cilostazol (PLETAL) 50 MG tablet Take 1 tablet (50 mg total) by mouth 2 (two) times daily. 180 tablet 3  . PARoxetine (PAXIL) 10 MG tablet TAKE 1 TABLET BY MOUTH ONCE DAILY 90 tablet 0  . potassium chloride SA (K-DUR,KLOR-CON)  20 MEQ tablet Take 1 tablet (20 mEq total) by mouth daily. 90 tablet 3  . ranitidine (ZANTAC) 150 MG tablet Take 1 tablet (150 mg total) by mouth 2 (two) times daily as needed for heartburn. 60 tablet 5   No current facility-administered medications on file prior to visit.     Past Medical History:  Diagnosis Date  . ANEMIA-NOS   . DYSLIPIDEMIA   . Hemorrhoids   . HYPERTENSION   . Osteoporosis, senile    DEXA 03/2014: -3.3 R fem  . Peripheral vascular disease, unspecified (HCC) dx 02/2010 ABI  . SMOKER     Past Surgical History:  Procedure Laterality Date  . CATARACT EXTRACTION W/ INTRAOCULAR LENS IMPLANT Right 03/12/14   groat  . HEMORRHOID SURGERY  05/18/2011   Procedure: HEMORRHOIDECTOMY;  Surgeon: Robyne AskewPaul S Toth III, MD;  Location: [facility];  Service: General;  Laterality: N/A;  hemorrhoidectomy, three columns, internal and external  . TONSILLECTOMY  1946  . TUBAL LIGATION  1979    Social History   Socioeconomic History  . Marital status: Single    Spouse name: Not on file  . Number of children: Not on file  . Years of education: Not on file  . Highest education level: Not on file  Social Needs  . Financial resource strain: Not on file  . Food insecurity - worry: Not on file  . Food insecurity - inability: Not on file  . Transportation needs - medical: Not on file  . Transportation needs - non-medical: Not on file  Occupational History  . Occupation: retired  Tobacco Use  . Smoking status: Current Every Day Smoker    Packs/day: 0.50    Years: 62.00    Pack years: 31.00    Types: Cigarettes  . Smokeless tobacco: Never Used  Substance and Sexual Activity  . Alcohol use: Yes    Alcohol/week: 0.0 oz    Comment: Social  . Drug use: No  . Sexual activity: Not on file  Other Topics Concern  . Not on file  Social History Narrative   Single, lives alone. 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    Family History  Problem Relation Age of Onset  . Breast  cancer Mother   . Lung cancer Father   . Hypertension Brother   . Hypertension Other     Review of Systems  Constitutional: Negative for chills and fever.  HENT: Negative for hearing loss and tinnitus.   Eyes: Negative for visual disturbance.  Respiratory: Negative for cough, shortness of breath and wheezing.   Cardiovascular: Negative for chest pain, palpitations and leg swelling.  Gastrointestinal: Negative for abdominal pain, blood in stool, constipation, diarrhea and nausea.       Urgency with stools; gerd controlled  Genitourinary: Negative for dysuria and hematuria.  Musculoskeletal: Negative  for arthralgias, back pain and myalgias.  Skin: Negative for color change and rash.  Neurological: Negative for light-headedness and headaches.  Psychiatric/Behavioral: Negative for dysphoric mood. The patient is not nervous/anxious.        Objective:   Vitals:   05/04/17 1405  BP: 122/84  Pulse: 84  Resp: 16  Temp: 98 F (36.7 C)  SpO2: 97%   Filed Weights   05/04/17 1405  Weight: 94 lb (42.6 kg)   Body mass index is 16.14 kg/m.  Wt Readings from Last 3 Encounters:  05/04/17 94 lb (42.6 kg)  01/12/17 94 lb 7 oz (42.8 kg)  12/27/16 94 lb 6.4 oz (42.8 kg)     Physical Exam Constitutional: She appears well-developed and well-nourished. No distress.  HENT:  Head: Normocephalic and atraumatic.  Right Ear: External ear normal. Normal ear canal and TM Left Ear: External ear normal.  Normal ear canal and TM Mouth/Throat: Oropharynx is clear and moist.  Eyes: Conjunctivae and EOM are normal.  Neck: Neck supple. No tracheal deviation present. No thyromegaly present.  No carotid bruit  Cardiovascular: Normal rate, regular rhythm and normal heart sounds.   No murmur heard.  No edema. Pulmonary/Chest: Effort normal and breath sounds normal. No respiratory distress. She has no wheezes. She has no rales.  Breast: deferred Abdominal: Soft. She exhibits no distension. There is  no tenderness.  Lymphadenopathy: She has no cervical adenopathy.  Skin: Skin is warm and dry. She is not diaphoretic.  Psychiatric: She has a normal mood and affect. Her behavior is normal.        Assessment & Plan:   Wellness Exam: Immunizations  Discussed shingrix, others up to date Colonoscopy  - no longer needed Mammogram no longer getting Gyn - no longer seeing Dexa  Up to date  Hearing loss  none Memory concerns/difficulties   none Independent of ADLs  Fully independent Stressed the importance of regular exercise   Patient received copy of preventative screening tests/immunizations recommended for the next 5-10 years.   Physical exam: Screening blood work  ordered Immunizations  Discussed shingrix, others up to date Colonoscopy  - no longer needed Mammogram  - no longer getting Gyn - no longer seeing Dexa  Up to date  Eye exams   Up to date  EKG  Done 11/2016 Exercise - walking regularly Weight  Weight on low side Skin no concerns Substance abuse -smoking, and has little desire to quit.  No other substance abuse.  She understands that she should quit smoking and is trying to decrease how much she smokes.  See Problem List for Assessment and Plan of chronic medical problems.   FU in 6 months

## 2017-05-03 NOTE — Patient Instructions (Addendum)
Jennifer [name] , Thank you for taking time to come for your Medicare Wellness Visit. I appreciate your ongoing commitment to your health goals. Please review the following plan we discussed and let me know if I can assist you in the future.   These are the goals we discussed: Goals    None      This is a list of the screening recommended for you and due dates:  Health Maintenance  Topic Date Due  . DEXA scan (bone density measurement)  05/25/2018  . Tetanus Vaccine  07/22/2023  . Flu Shot  Completed  . Pneumonia vaccines  Completed   Test(s) ordered today. Your results will be released to [facility] (or called to you) after review, usually within 72hours after test completion. If any changes need to be made, you will be notified at that same time.  All other Health Maintenance issues reviewed.   All recommended immunizations and age-appropriate screenings are up-to-date or discussed.  No immunizations administered today.   Medications reviewed and updated. No changes recommended at this time.   Please followup in 6 months   Health Maintenance, Female Adopting a healthy lifestyle and getting preventive care can go a long way to promote health and wellness. Talk with your health care provider about what schedule of regular examinations is right for you. This is a good chance for you to check in with your provider about disease prevention and staying healthy. In between checkups, there are plenty of things you can do on your own. Experts have done a lot of research about which lifestyle changes and preventive measures are most likely to keep you healthy. Ask your health care provider for more information. Weight and diet Eat a healthy diet  Be sure to include plenty of vegetables, fruits, low-fat dairy products, and lean protein.  Do not eat a lot of foods high in solid fats, added sugars, or salt.  Get regular exercise. This is one of the most important things you can do for your  health. ? Most adults should exercise for at least 150 minutes each week. The exercise should increase your heart rate and make you sweat (moderate-intensity exercise). ? Most adults should also do strengthening exercises at least twice a week. This is in addition to the moderate-intensity exercise.  Maintain a healthy weight  Body mass index (BMI) is a measurement that can be used to identify possible weight problems. It estimates body fat based on height and weight. Your health care provider can help determine your BMI and help you achieve or maintain a healthy weight.  For females 25 years of age and older: ? A BMI below 18.5 is considered underweight. ? A BMI of 18.5 to 24.9 is normal. ? A BMI of 25 to 29.9 is considered overweight. ? A BMI of 30 and above is considered obese.  Watch levels of cholesterol and blood lipids  You should start having your blood tested for lipids and cholesterol at 79 years of age, then have this test every 5 years.  You may need to have your cholesterol levels checked more often if: ? Your lipid or cholesterol levels are high. ? You are older than 79 years of age. ? You are at high risk for heart disease.  Cancer screening Lung Cancer  Lung cancer screening is recommended for adults 31-57 years old who are at high risk for lung cancer because of a history of smoking.  A yearly low-dose CT scan of the lungs is  recommended for people who: ? Currently smoke. ? Have quit within the past 15 years. ? Have at least a 30-pack-year history of smoking. A pack year is smoking an average of one pack of cigarettes a day for 1 year.  Yearly screening should continue until it has been 15 years since you quit.  Yearly screening should stop if you develop a health problem that would prevent you from having lung cancer treatment.  Breast Cancer  Practice breast self-awareness. This means understanding how your breasts normally appear and feel.  It also means  doing regular breast self-exams. Let your health care provider know about any changes, no matter how small.  If you are in your 20s or 30s, you should have a clinical breast exam (CBE) by a health care provider every 1-3 years as part of a regular health exam.  If you are 68 or older, have a CBE every year. Also consider having a breast X-ray (mammogram) every year.  If you have a family history of breast cancer, talk to your health care provider about genetic screening.  If you are at high risk for breast cancer, talk to your health care provider about having an MRI and a mammogram every year.  Breast cancer gene (BRCA) assessment is recommended for women who have family members with BRCA-related cancers. BRCA-related cancers include: ? Breast. ? Ovarian. ? Tubal. ? Peritoneal cancers.  Results of the assessment will determine the need for genetic counseling and BRCA1 and BRCA2 testing.  Cervical Cancer Your health care provider may recommend that you be screened regularly for cancer of the pelvic organs (ovaries, uterus, and vagina). This screening involves a pelvic examination, including checking for microscopic changes to the surface of your cervix (Pap test). You may be encouraged to have this screening done every 3 years, beginning at age 53.  For women ages 36-65, health care providers may recommend pelvic exams and Pap testing every 3 years, or they may recommend the Pap and pelvic exam, combined with testing for human papilloma virus (HPV), every 5 years. Some types of HPV increase your risk of cervical cancer. Testing for HPV may also be done on women of any age with unclear Pap test results.  Other health care providers may not recommend any screening for nonpregnant women who are considered low risk for pelvic cancer and who do not have symptoms. Ask your health care provider if a screening pelvic exam is right for you.  If you have had past treatment for cervical cancer or a  condition that could lead to cancer, you need Pap tests and screening for cancer for at least 20 years after your treatment. If Pap tests have been discontinued, your risk factors (such as having a new sexual partner) need to be reassessed to determine if screening should resume. Some women have medical problems that increase the chance of getting cervical cancer. In these cases, your health care provider may recommend more frequent screening and Pap tests.  Colorectal Cancer  This type of cancer can be detected and often prevented.  Routine colorectal cancer screening usually begins at 79 years of age and continues through 79 years of age.  Your health care provider may recommend screening at an earlier age if you have risk factors for colon cancer.  Your health care provider may also recommend using home test kits to check for hidden blood in the stool.  A small camera at the end of a tube can be used to examine your  colon directly (sigmoidoscopy or colonoscopy). This is done to check for the earliest forms of colorectal cancer.  Routine screening usually begins at age 23.  Direct examination of the colon should be repeated every 5-10 years through 79 years of age. However, you may need to be screened more often if early forms of precancerous polyps or small growths are found.  Skin Cancer  Check your skin from head to toe regularly.  Tell your health care provider about any new moles or changes in moles, especially if there is a change in a mole's shape or color.  Also tell your health care provider if you have a mole that is larger than the size of a pencil eraser.  Always use sunscreen. Apply sunscreen liberally and repeatedly throughout the day.  Protect yourself by wearing long sleeves, pants, a wide-brimmed hat, and sunglasses whenever you are outside.  Heart disease, diabetes, and high blood pressure  High blood pressure causes heart disease and increases the risk of stroke.  High blood pressure is more likely to develop in: ? People who have blood pressure in the high end of the normal range (130-139/85-89 mm Hg). ? People who are overweight or obese. ? People who are African American.  If you are 27-67 years of age, have your blood pressure checked every 3-5 years. If you are 58 years of age or older, have your blood pressure checked every year. You should have your blood pressure measured twice-once when you are at a hospital or clinic, and once when you are not at a hospital or clinic. Record the average of the two measurements. To check your blood pressure when you are not at a hospital or clinic, you can use: ? An automated blood pressure machine at a pharmacy. ? A home blood pressure monitor.  If you are between 42 years and 65 years old, ask your health care provider if you should take aspirin to prevent strokes.  Have regular diabetes screenings. This involves taking a blood sample to check your fasting blood sugar level. ? If you are at a normal weight and have a low risk for diabetes, have this test once every three years after 79 years of age. ? If you are overweight and have a high risk for diabetes, consider being tested at a younger age or more often. Preventing infection Hepatitis B  If you have a higher risk for hepatitis B, you should be screened for this virus. You are considered at high risk for hepatitis B if: ? You were born in a country where hepatitis B is common. Ask your health care provider which countries are considered high risk. ? Your parents were born in a high-risk country, and you have not been immunized against hepatitis B (hepatitis B vaccine). ? You have HIV or AIDS. ? You use needles to inject street drugs. ? You live with someone who has hepatitis B. ? You have had sex with someone who has hepatitis B. ? You get hemodialysis treatment. ? You take certain medicines for conditions, including cancer, organ transplantation, and  autoimmune conditions.  Hepatitis C  Blood testing is recommended for: ? Everyone born from [birth year] through 1967. ? Anyone with known risk factors for hepatitis C.  Sexually transmitted infections (STIs)  You should be screened for sexually transmitted infections (STIs) including gonorrhea and chlamydia if: ? You are sexually active and are younger than 79 years of age. ? You are older than 79 years of age and your health  care provider tells you that you are at risk for this type of infection. ? Your sexual activity has changed since you were last screened and you are at an increased risk for chlamydia or gonorrhea. Ask your health care provider if you are at risk.  If you do not have HIV, but are at risk, it may be recommended that you take a prescription medicine daily to prevent HIV infection. This is called pre-exposure prophylaxis (PrEP). You are considered at risk if: ? You are sexually active and do not regularly use condoms or know the HIV status of your partner(s). ? You take drugs by injection. ? You are sexually active with a partner who has HIV.  Talk with your health care provider about whether you are at high risk of being infected with HIV. If you choose to begin PrEP, you should first be tested for HIV. You should then be tested every 3 months for as long as you are taking PrEP. Pregnancy  If you are premenopausal and you may become pregnant, ask your health care provider about preconception counseling.  If you may become pregnant, take 400 to 800 micrograms (mcg) of folic acid every day.  If you want to prevent pregnancy, talk to your health care provider about birth control (contraception). Osteoporosis and menopause  Osteoporosis is a disease in which the bones lose minerals and strength with aging. This can result in serious bone fractures. Your risk for osteoporosis can be identified using a bone density scan.  If you are 40 years of age or older, or if you are at  risk for osteoporosis and fractures, ask your health care provider if you should be screened.  Ask your health care provider whether you should take a calcium or vitamin D supplement to lower your risk for osteoporosis.  Menopause may have certain physical symptoms and risks.  Hormone replacement therapy may reduce some of these symptoms and risks. Talk to your health care provider about whether hormone replacement therapy is right for you. Follow these instructions at home:  Schedule regular health, dental, and eye exams.  Stay current with your immunizations.  Do not use any tobacco products including cigarettes, chewing tobacco, or electronic cigarettes.  If you are pregnant, do not drink alcohol.  If you are breastfeeding, limit how much and how often you drink alcohol.  Limit alcohol intake to no more than 1 drink per day for nonpregnant women. One drink equals 12 ounces of beer, 5 ounces of wine, or 1 ounces of hard liquor.  Do not use street drugs.  Do not share needles.  Ask your health care provider for help if you need support or information about quitting drugs.  Tell your health care provider if you often feel depressed.  Tell your health care provider if you have ever been abused or do not feel safe at home. This information is not intended to replace advice given to you by your health care provider. Make sure you discuss any questions you have with your health care provider. Document Released: 10/24/2010 Document Revised: 09/16/2015 Document Reviewed: 01/12/2015 Elsevier Interactive Patient Education  [employer].

## 2017-05-04 ENCOUNTER — Ambulatory Visit (INDEPENDENT_AMBULATORY_CARE_PROVIDER_SITE_OTHER): Payer: Medicare Other | Admitting: Internal Medicine

## 2017-05-04 ENCOUNTER — Encounter: Payer: Self-pay | Admitting: Internal Medicine

## 2017-05-04 ENCOUNTER — Other Ambulatory Visit (INDEPENDENT_AMBULATORY_CARE_PROVIDER_SITE_OTHER): Payer: Medicare Other

## 2017-05-04 VITALS — BP 122/84 | HR 84 | Temp 98.0°F | Resp 16 | Wt 94.0 lb

## 2017-05-04 DIAGNOSIS — I10 Essential (primary) hypertension: Secondary | ICD-10-CM

## 2017-05-04 DIAGNOSIS — Z0000 Encounter for general adult medical examination without abnormal findings: Secondary | ICD-10-CM

## 2017-05-04 DIAGNOSIS — K219 Gastro-esophageal reflux disease without esophagitis: Secondary | ICD-10-CM

## 2017-05-04 DIAGNOSIS — R7303 Prediabetes: Secondary | ICD-10-CM | POA: Diagnosis not present

## 2017-05-04 DIAGNOSIS — E785 Hyperlipidemia, unspecified: Secondary | ICD-10-CM

## 2017-05-04 DIAGNOSIS — M810 Age-related osteoporosis without current pathological fracture: Secondary | ICD-10-CM

## 2017-05-04 DIAGNOSIS — F411 Generalized anxiety disorder: Secondary | ICD-10-CM

## 2017-05-04 LAB — LIPID PANEL
Cholesterol: 190 mg/dL (ref 0–200)
HDL: 76.7 mg/dL (ref >39.00)
LDL CALC: 95 mg/dL (ref 0–99)
NonHDL: 113.68
Total CHOL/HDL Ratio: 2
Triglycerides: 95 mg/dL (ref 0.0–149.0)
VLDL: 19 mg/dL (ref 0.0–40.0)

## 2017-05-04 LAB — CBC WITH DIFFERENTIAL/PLATELET
BASOS PCT: 1.3 % (ref 0.0–3.0)
Basophils Absolute: 0.1 10*3/uL (ref 0.0–0.1)
EOS PCT: 1.5 % (ref 0.0–5.0)
Eosinophils Absolute: 0.1 10*3/uL (ref 0.0–0.7)
HEMATOCRIT: 38.7 % (ref 36.0–46.0)
HEMOGLOBIN: 12.7 g/dL (ref 12.0–15.0)
Lymphocytes Relative: 36 % (ref 12.0–46.0)
Lymphs Abs: 2.6 10*3/uL (ref 0.7–4.0)
MCHC: 32.8 g/dL (ref 30.0–36.0)
MCV: 87.3 fl (ref 78.0–100.0)
MONOS PCT: 9.4 % (ref 3.0–12.0)
Monocytes Absolute: 0.7 10*3/uL (ref 0.1–1.0)
Neutro Abs: 3.8 10*3/uL (ref 1.4–7.7)
Neutrophils Relative %: 51.8 % (ref 43.0–77.0)
Platelets: 326 10*3/uL (ref 150.0–400.0)
RBC: 4.43 Mil/uL (ref 3.87–5.11)
RDW: 14.3 % (ref 11.5–15.5)
WBC: 7.3 10*3/uL (ref 4.0–10.5)

## 2017-05-04 LAB — COMPREHENSIVE METABOLIC PANEL
ALBUMIN: 4 g/dL (ref 3.5–5.2)
ALT: 9 U/L (ref 0–35)
AST: 17 U/L (ref 0–37)
Alkaline Phosphatase: 57 U/L (ref 39–117)
BUN: 15 mg/dL (ref 6–23)
CALCIUM: 9 mg/dL (ref 8.4–10.5)
CO2: 27 mEq/L (ref 19–32)
Chloride: 98 mEq/L (ref 96–112)
Creatinine, Ser: 0.77 mg/dL (ref 0.40–1.20)
GFR: 76.91 mL/min (ref >60.00)
Glucose, Bld: 93 mg/dL (ref 70–99)
POTASSIUM: 4 meq/L (ref 3.5–5.1)
Sodium: 131 mEq/L — ABNORMAL LOW (ref 135–145)
Total Bilirubin: 0.4 mg/dL (ref 0.2–1.2)
Total Protein: 8 g/dL (ref 6.0–8.3)

## 2017-05-04 LAB — HEMOGLOBIN A1C: Hgb A1c MFr Bld: 6.1 % (ref 4.6–6.5)

## 2017-05-04 LAB — TSH: TSH: 3.4 u[IU]/mL (ref 0.35–4.50)

## 2017-05-04 NOTE — Assessment & Plan Note (Signed)
Blood pressure well controlled Continue current medications at current doses CMP 

## 2017-05-04 NOTE — Assessment & Plan Note (Signed)
Check a1c Low sugar / carb diet Stressed regular exercise   

## 2017-05-04 NOTE — Addendum Note (Signed)
Addended by: Clarice PoleHINESLEY, Amayia Ciano M on: 05/04/2017 03:01 PM   Modules accepted: Orders

## 2017-05-04 NOTE — Assessment & Plan Note (Addendum)
prolia injections Taking calcium and vitamin d Walking dog Stressed smoking cessation

## 2017-05-04 NOTE — Assessment & Plan Note (Signed)
Check lipid panel  Continue daily statin Regular exercise and healthy diet encouraged  

## 2017-05-04 NOTE — Assessment & Plan Note (Signed)
Taking Paxil 10 mg daily and Xanax as needed Overall anxiety well controlled with both medications Continue above

## 2017-05-04 NOTE — Assessment & Plan Note (Signed)
GERD controlled Continue daily medication  

## 2017-05-05 ENCOUNTER — Encounter: Payer: Self-pay | Admitting: Internal Medicine

## 2017-06-06 ENCOUNTER — Other Ambulatory Visit: Payer: Self-pay | Admitting: Internal Medicine

## 2017-06-06 NOTE — Telephone Encounter (Signed)
[place] Controlled Substance Database checked. Last filled on 04/19/17 

## 2017-06-14 ENCOUNTER — Other Ambulatory Visit: Payer: Self-pay | Admitting: Internal Medicine

## 2017-06-15 NOTE — Telephone Encounter (Signed)
Ok to fill rx sent.  Has been filled by cardiology in recent past

## 2017-06-15 NOTE — Telephone Encounter (Signed)
Please advise, not been filled since 2016 by Felicity CoyerLeschber

## 2017-06-28 ENCOUNTER — Other Ambulatory Visit: Payer: Self-pay | Admitting: Internal Medicine

## 2017-07-27 ENCOUNTER — Other Ambulatory Visit: Payer: Self-pay | Admitting: Internal Medicine

## 2017-08-01 ENCOUNTER — Telehealth: Payer: Self-pay | Admitting: Internal Medicine

## 2017-08-01 NOTE — Telephone Encounter (Addendum)
Spoke with patient regarding Prolia injection. Insurance verified, patient is responsible for a $240 copay. She expressed understanding and is scheduled to come on 08/21/17.

## 2017-08-07 ENCOUNTER — Ambulatory Visit: Payer: Medicare Other

## 2017-08-21 ENCOUNTER — Ambulatory Visit (INDEPENDENT_AMBULATORY_CARE_PROVIDER_SITE_OTHER): Payer: Medicare Other

## 2017-08-21 DIAGNOSIS — M810 Age-related osteoporosis without current pathological fracture: Secondary | ICD-10-CM | POA: Diagnosis not present

## 2017-08-21 MED ORDER — DENOSUMAB 60 MG/ML ~~LOC~~ SOSY
60.0000 mg | PREFILLED_SYRINGE | Freq: Once | SUBCUTANEOUS | Status: AC
  Administered 2017-08-21: 60 mg via SUBCUTANEOUS

## 2017-08-21 NOTE — Progress Notes (Signed)
prolia Injection given.   Stacy J Burns, MD  

## 2017-09-10 ENCOUNTER — Other Ambulatory Visit: Payer: Self-pay | Admitting: Internal Medicine

## 2017-09-27 ENCOUNTER — Ambulatory Visit (HOSPITAL_COMMUNITY)
Admission: RE | Admit: 2017-09-27 | Discharge: 2017-09-27 | Disposition: A | Discharge status: Home / Self Care | Payer: Medicare Other | Source: Ambulatory Visit | Attending: Cardiology | Admitting: Cardiology

## 2017-09-27 DIAGNOSIS — I10 Essential (primary) hypertension: Secondary | ICD-10-CM | POA: Insufficient documentation

## 2017-09-27 DIAGNOSIS — I6523 Occlusion and stenosis of bilateral carotid arteries: Secondary | ICD-10-CM | POA: Insufficient documentation

## 2017-09-27 DIAGNOSIS — F17200 Nicotine dependence, unspecified, uncomplicated: Secondary | ICD-10-CM | POA: Insufficient documentation

## 2017-09-27 DIAGNOSIS — I739 Peripheral vascular disease, unspecified: Secondary | ICD-10-CM | POA: Insufficient documentation

## 2017-09-27 DIAGNOSIS — R9389 Abnormal findings on diagnostic imaging of other specified body structures: Secondary | ICD-10-CM | POA: Insufficient documentation

## 2017-09-28 ENCOUNTER — Other Ambulatory Visit: Payer: Self-pay | Admitting: *Deleted

## 2017-10-30 NOTE — Progress Notes (Signed)
Subjective:    Patient ID: Jennifer [name], female    DOB: 1940/06/14, 77 y.o.   MRN: [id number]  HPI The patient is here for follow up.  Sometimes when she eats she feels like the food gets stuck.  It occurs maximum of once a week.  It occurs more with meats and bread.  She denies problem with yogurt, liquids, ice cream, pasta and sandwiches.  It eventually passes.  She has GERD when she eats anything acidic, but not on a regular basis.  It started in December and has not gotten worse.    Hypertension: She is taking her medication daily. She is compliant with a low sodium diet.  She denies chest pain, palpitations, edema, shortness of breath and regular headaches. She is exercising regularly - walking.  She does not monitor her blood pressure at home.    Prediabetes:  She is compliant with a low sugar/carbohydrate diet.  She is exercising regularly.  Hyperlipidemia: She is taking her medication daily. She is compliant with a low fat/cholesterol diet. She is exercising regularly. She denies myalgias.   Anxiety: She is taking her medication daily as prescribed. She denies any side effects from the medication. She feels her anxiety is well controlled and she is happy with her current dose of medication.    Medications and allergies reviewed with patient and updated if appropriate.  Patient Active Problem List   Diagnosis Date Noted  . GERD (gastroesophageal reflux disease) 12/08/2016  . History of Clostridium difficile infection 11/29/2016  . Prediabetes 11/02/2016  . Carotid arterial disease (HCC) 10/20/2015  . Anxiety state   . PAD (peripheral artery disease) (HCC) 10/10/2011  . Osteoporosis, senile 05/17/2011  . Dyslipidemia 05/03/2009  . SMOKER 04/14/2009  . Essential hypertension 04/14/2009    Current Outpatient Medications on File Prior to Visit  Medication Sig Dispense Refill  . ALPRAZolam (XANAX) 0.25 MG tablet TAKE 1 TABLET BY MOUTH EVERY NIGHT AT BEDTIME AS NEEDED FOR  ANXIETY OR SLEEP 30 tablet 2  . amLODipine (NORVASC) 5 MG tablet TAKE 1 TABLET BY MOUTH EVERY NIGHT AT BEDTIME 90 tablet 1  . aspirin 81 MG tablet Take 81 mg by mouth daily before breakfast.     . atorvastatin (LIPITOR) 20 MG tablet TAKE 1 TABLET BY MOUTH ONCE DAILY 90 tablet 3  . calcium-vitamin D (OSCAL WITH D) 500-200 MG-UNIT per tablet Take 2 tablets by mouth daily.    . cilostazol (PLETAL) 50 MG tablet TAKE 1 TABLET(50 MG) BY MOUTH TWICE DAILY 180 tablet 1  . PARoxetine (PAXIL) 10 MG tablet TAKE 1 TABLET BY MOUTH ONCE DAILY 90 tablet 1  . potassium chloride SA (K-DUR,KLOR-CON) 20 MEQ tablet Take 1 tablet (20 mEq total) by mouth daily. 90 tablet 3  . ranitidine (ZANTAC) 150 MG tablet TAKE 1 TABLET(150 MG) BY MOUTH TWICE DAILY AS NEEDED FOR HEARTBURN 180 tablet 1   No current facility-administered medications on file prior to visit.     Past Medical History:  Diagnosis Date  . ANEMIA-NOS   . DYSLIPIDEMIA   . Hemorrhoids   . HYPERTENSION   . Osteoporosis, senile    DEXA 03/2014: -3.3 R fem  . Peripheral vascular disease, unspecified (HCC) dx 02/2010 ABI  . SMOKER     Past Surgical History:  Procedure Laterality Date  . CATARACT EXTRACTION W/ INTRAOCULAR LENS IMPLANT Right 03/12/14   groat  . HEMORRHOID SURGERY  05/18/2011   Procedure: HEMORRHOIDECTOMY;  Surgeon: Robyne AskewPaul S Toth III, MD;  Location: [facility];  Service: General;  Laterality: N/A;  hemorrhoidectomy, three columns, internal and external  . TONSILLECTOMY  1946  . TUBAL LIGATION  1979    Social History   Socioeconomic History  . Marital status: Single    Spouse name: Not on file  . Number of children: Not on file  . Years of education: Not on file  . Highest education level: Not on file  Occupational History  . Occupation: retired  Engineer, production  . Financial resource strain: Not on file  . Food insecurity:    Worry: Not on file    Inability: Not on file  . Transportation needs:    Medical: Not on file     Non-medical: Not on file  Tobacco Use  . Smoking status: Current Every Day Smoker    Packs/day: 0.50    Years: 62.00    Pack years: 31.00    Types: Cigarettes  . Smokeless tobacco: Never Used  Substance and Sexual Activity  . Alcohol use: Yes    Alcohol/week: 0.0 oz    Comment: Social  . Drug use: No  . Sexual activity: Not on file  Lifestyle  . Physical activity:    Days per week: Not on file    Minutes per session: Not on file  . Stress: Not on file  Relationships  . Social connections:    Talks on phone: Not on file    Gets together: Not on file    Attends religious service: Not on file    Active member of club or organization: Not on file    Attends meetings of clubs or organizations: Not on file    Relationship status: Not on file  Other Topics Concern  . Not on file  Social History Narrative   Single, lives alone. Retired Pensions consultant. Enjoys Agricultural consultant work.    Family History  Problem Relation Age of Onset  . Breast cancer Mother   . Lung cancer Father   . Hypertension Brother   . Hypertension Other     Review of Systems  Constitutional: Negative for chills and fever.  Respiratory: Negative for cough, shortness of breath and wheezing.   Cardiovascular: Negative for chest pain, palpitations and leg swelling.  Neurological: Negative for light-headedness and headaches.       Objective:   Vitals:   10/31/17 1411  BP: 134/86  Pulse: 94  Resp: 16  Temp: 97.9 F (36.6 C)  SpO2: 97%   BP Readings from Last 3 Encounters:  10/31/17 134/86  05/04/17 122/84  01/12/17 140/80   Wt Readings from Last 3 Encounters:  10/31/17 97 lb (44 kg)  05/04/17 94 lb (42.6 kg)  01/12/17 94 lb 7 oz (42.8 kg)   Body mass index is 16.65 kg/m.   Physical Exam    Constitutional: Appears well-developed and well-nourished. No distress.  HENT:  Head: Normocephalic and atraumatic.  Neck: Neck supple. No tracheal deviation present. No thyromegaly present.  No  cervical lymphadenopathy Cardiovascular: Normal rate, regular rhythm and normal heart sounds.   No murmur heard. No carotid bruit .  No edema Pulmonary/Chest: Effort normal and breath sounds normal. No respiratory distress. Mild exp wheezes. No rales.  Skin: Skin is warm and dry. Not diaphoretic.  Psychiatric: Normal mood and affect. Behavior is normal.      Assessment & Plan:    See Problem List for Assessment and Plan of chronic medical problems.

## 2017-10-31 ENCOUNTER — Other Ambulatory Visit (INDEPENDENT_AMBULATORY_CARE_PROVIDER_SITE_OTHER): Payer: Medicare Other

## 2017-10-31 ENCOUNTER — Encounter: Payer: Self-pay | Admitting: Internal Medicine

## 2017-10-31 ENCOUNTER — Ambulatory Visit (INDEPENDENT_AMBULATORY_CARE_PROVIDER_SITE_OTHER): Payer: Medicare Other | Admitting: Internal Medicine

## 2017-10-31 VITALS — BP 134/86 | HR 94 | Temp 97.9°F | Resp 16 | Wt 97.0 lb

## 2017-10-31 DIAGNOSIS — I10 Essential (primary) hypertension: Secondary | ICD-10-CM

## 2017-10-31 DIAGNOSIS — E785 Hyperlipidemia, unspecified: Secondary | ICD-10-CM | POA: Diagnosis not present

## 2017-10-31 DIAGNOSIS — I739 Peripheral vascular disease, unspecified: Secondary | ICD-10-CM

## 2017-10-31 DIAGNOSIS — R7303 Prediabetes: Secondary | ICD-10-CM | POA: Diagnosis not present

## 2017-10-31 DIAGNOSIS — K219 Gastro-esophageal reflux disease without esophagitis: Secondary | ICD-10-CM

## 2017-10-31 DIAGNOSIS — F411 Generalized anxiety disorder: Secondary | ICD-10-CM | POA: Diagnosis not present

## 2017-10-31 DIAGNOSIS — R1310 Dysphagia, unspecified: Secondary | ICD-10-CM

## 2017-10-31 LAB — COMPREHENSIVE METABOLIC PANEL
ALBUMIN: 4.1 g/dL (ref 3.5–5.2)
ALT: 7 U/L (ref 0–35)
AST: 17 U/L (ref 0–37)
Alkaline Phosphatase: 49 U/L (ref 39–117)
BUN: 13 mg/dL (ref 6–23)
CALCIUM: 9.2 mg/dL (ref 8.4–10.5)
CHLORIDE: 98 meq/L (ref 96–112)
CO2: 27 mEq/L (ref 19–32)
CREATININE: 0.82 mg/dL (ref 0.40–1.20)
GFR: 71.44 mL/min (ref >60.00)
Glucose, Bld: 100 mg/dL — ABNORMAL HIGH (ref 70–99)
Potassium: 4.2 mEq/L (ref 3.5–5.1)
Sodium: 132 mEq/L — ABNORMAL LOW (ref 135–145)
Total Bilirubin: 0.4 mg/dL (ref 0.2–1.2)
Total Protein: 8.1 g/dL (ref 6.0–8.3)

## 2017-10-31 LAB — HEMOGLOBIN A1C: HEMOGLOBIN A1C: 6 % (ref 4.6–6.5)

## 2017-10-31 NOTE — Patient Instructions (Addendum)
  Test(s) ordered today. Your results will be released to MyChart (or called to you) after review, usually within 72hours after test completion. If any changes need to be made, you will be notified at that same time.   Medications reviewed and updated.  Changes include increasing zantac to twice a day.  Let me know if your swallowing does not improve so we can refer you to GI for further evaluation.    A referral was ordered for GI.  Please followup in 6 months

## 2017-10-31 NOTE — Assessment & Plan Note (Signed)
BP well controlled Current regimen effective and well tolerated Continue current medications at current doses cmp  

## 2017-10-31 NOTE — Assessment & Plan Note (Signed)
Taking paxil daily and xanax prn Controlled, stable Continue current dose of medication

## 2017-10-31 NOTE — Assessment & Plan Note (Signed)
Continue pletal 

## 2017-10-31 NOTE — Assessment & Plan Note (Addendum)
Taking ranitidine daily - once a day - not always twice a day occ GERD but has intermittent dysphagia Increase ranitidine to twice a day.   Refer to GI

## 2017-10-31 NOTE — Assessment & Plan Note (Signed)
Check lipid panel  Continue daily statin Regular exercise and healthy diet encouraged  

## 2017-10-31 NOTE — Assessment & Plan Note (Signed)
Having intermittent difficulty swallowing larger food pieces Concern for stricture ? Related to GERD Increase ranitidine to BID Refer to GI

## 2017-10-31 NOTE — Assessment & Plan Note (Signed)
Check a1c Low sugar / carb diet Stressed regular exercise   

## 2017-11-01 ENCOUNTER — Encounter: Payer: Self-pay | Admitting: Internal Medicine

## 2017-11-06 ENCOUNTER — Other Ambulatory Visit: Payer: Self-pay | Admitting: Internal Medicine

## 2017-11-06 NOTE — Telephone Encounter (Signed)
Check [place] registry last filled 09/18/2017../lmb  

## 2017-11-06 NOTE — Telephone Encounter (Signed)
[place] Controlled Substance Database checked. Last filled on 09/18/17 

## 2017-11-10 ENCOUNTER — Other Ambulatory Visit: Payer: Self-pay | Admitting: Internal Medicine

## 2017-11-13 ENCOUNTER — Other Ambulatory Visit: Payer: Self-pay | Admitting: Internal Medicine

## 2017-12-13 ENCOUNTER — Other Ambulatory Visit: Payer: Self-pay | Admitting: Internal Medicine

## 2017-12-24 ENCOUNTER — Other Ambulatory Visit: Payer: Self-pay | Admitting: Internal Medicine

## 2017-12-31 ENCOUNTER — Other Ambulatory Visit: Payer: Self-pay | Admitting: Internal Medicine

## 2017-12-31 NOTE — Telephone Encounter (Signed)
Last OV was 10/31/17 Next OV is 05/03/17  Last refill was 11/06/17

## 2018-01-02 ENCOUNTER — Ambulatory Visit: Payer: Medicare Other | Admitting: Internal Medicine

## 2018-01-02 ENCOUNTER — Encounter: Payer: Self-pay | Admitting: Internal Medicine

## 2018-01-02 VITALS — BP 160/64 | HR 100 | Ht 64.0 in | Wt 95.2 lb

## 2018-01-02 DIAGNOSIS — K219 Gastro-esophageal reflux disease without esophagitis: Secondary | ICD-10-CM

## 2018-01-02 DIAGNOSIS — R1310 Dysphagia, unspecified: Secondary | ICD-10-CM

## 2018-01-02 MED ORDER — OMEPRAZOLE 40 MG PO CPDR: 40.0000 mg | DELAYED_RELEASE_CAPSULE | Freq: Every day | ORAL | 3 refills | Status: DC

## 2018-01-02 NOTE — Patient Instructions (Signed)
We have sent the following medications to your pharmacy for you to pick up at your convenience:  Omeprazole  You have been scheduled for an endoscopy. Please follow written instructions given to you at your visit today. If you use inhalers (even only as needed), please bring them with you on the day of your procedure. Your physician has requested that you go to www.[email or web address] and enter the access code given to you at your visit today. This web site gives a general overview about your procedure. However, you should still follow specific instructions given to you by our office regarding your preparation for the procedure.   

## 2018-01-02 NOTE — Progress Notes (Signed)
HISTORY OF PRESENT ILLNESS:  Jennifer [name] is a 77 y.o. female with chronic tobacco abuse, hypertension, and peripheral vascular disease for which she is on Pletal , who is sent today by her primary care provider Dr. Christophe Louis regarding dysphagia. The patient reports to me a one-year history of intermittent solid food dysphagia which has worsened in recent months. She will have difficulty several times per week despite being careful with her food chewing. She describes sounds like transient food impaction with concurrent accumulation of secretions. She does have intermittent problems with heartburn and indigestion for which she is on H2 receptor antagonist therapy twice daily. Despite this, breakthrough symptoms. She is a chronic smoker. Does not use alcohol. No previous GI evaluations. She reports to me having undergone negative cologuard several years ago. GI review of systems is otherwise negative. She denies weight loss. Review of outside laboratories from July 2019 finds a unremarkable comprehensive metabolic panel. CBC from January normal with hemoglobin 12.7. No relevant x-ray abnormalities upon review  REVIEW OF SYSTEMS:  All non-GI ROS negative unless otherwise stated in the history of present illness except for arthritis  Past Medical History:  Diagnosis Date  . ANEMIA-NOS   . DYSLIPIDEMIA   . Hemorrhoids   . HYPERTENSION   . Osteoporosis, senile    DEXA 03/2014: -3.3 R fem  . Peripheral vascular disease, unspecified (HCC) dx 02/2010 ABI  . SMOKER     Past Surgical History:  Procedure Laterality Date  . CATARACT EXTRACTION W/ INTRAOCULAR LENS IMPLANT Right 03/12/14   groat  . HEMORRHOID SURGERY  05/18/2011   Procedure: HEMORRHOIDECTOMY;  Surgeon: Robyne Askew, MD;  Location: [facility];  Service: General;  Laterality: N/A;  hemorrhoidectomy, three columns, internal and external  . TONSILLECTOMY  1946  . TUBAL LIGATION  1979    Social History CINA SCHNALL  reports that she  has been smoking cigarettes. She has a 31.00 pack-year smoking history. She has never used smokeless tobacco. She reports that she drinks alcohol. She reports that she does not use drugs.  family history includes Breast cancer in her mother; Hypertension in her brother; Lung cancer in her father.  No Known Allergies     PHYSICAL EXAMINATION: Vital signs: BP (!) 160/64   Pulse 100   Ht 5\' 4"  (1.626 m)   Wt 95 lb 3.2 oz (43.2 kg)   BMI 16.34 kg/m   Constitutional: thin, pleasant,generally well-appearing, no acute distress. Reeks of tobacco smoke Psychiatric: alert and oriented x3, cooperative Eyes: extraocular movements intact, anicteric, conjunctiva pink Mouth: oral pharynx moist, no lesions Neck: supple no lymphadenopathy Cardiovascular: heart regular rate and rhythm, no murmur Lungs: clear to auscultation bilaterally Back: Kyphosis Abdomen: soft, nontender, nondistended, no obvious ascites, no peritoneal signs, normal bowel sounds, no organomegaly Rectal:omitted Extremities: no clubbing, cyanosis, or lower extremity edema bilaterally Skin: no lesions on visible extremities Neuro: No focal deficits. Cranial nerves intact  ASSESSMENT:  1. Intermittent solid food dysphagia. Suspect peptic stricture 2. GERD with breakthrough symptoms 3. Peripheral vascular disease on Pletal  PLAN:  1. Prescribe omeprazole 40 mg daily. This in favor of ranitidine 2. Reflux precautions 3. Schedule upper endoscopy with possible esophageal dilation.The nature of the procedure, as well as the risks, benefits, and alternatives were carefully and thoroughly reviewed with the patient. Ample time for discussion and questions allowed. The patient understood, was satisfied, and agreed to proceed. 4. Hold Pletal

## 2018-01-03 ENCOUNTER — Telehealth: Payer: Self-pay | Admitting: *Deleted

## 2018-01-03 ENCOUNTER — Ambulatory Visit (AMBULATORY_SURGERY_CENTER): Payer: Medicare Other | Admitting: Internal Medicine

## 2018-01-03 ENCOUNTER — Encounter: Payer: Self-pay | Admitting: Internal Medicine

## 2018-01-03 VITALS — BP 108/87 | HR 83 | Temp 98.7°F | Resp 20 | Ht 64.0 in | Wt 95.2 lb

## 2018-01-03 DIAGNOSIS — K2100 Gastro-esophageal reflux disease with esophagitis, without bleeding: Secondary | ICD-10-CM

## 2018-01-03 DIAGNOSIS — R1310 Dysphagia, unspecified: Secondary | ICD-10-CM

## 2018-01-03 DIAGNOSIS — R1319 Other dysphagia: Secondary | ICD-10-CM

## 2018-01-03 DIAGNOSIS — K449 Diaphragmatic hernia without obstruction or gangrene: Secondary | ICD-10-CM

## 2018-01-03 DIAGNOSIS — K219 Gastro-esophageal reflux disease without esophagitis: Secondary | ICD-10-CM | POA: Diagnosis not present

## 2018-01-03 DIAGNOSIS — K222 Esophageal obstruction: Secondary | ICD-10-CM

## 2018-01-03 HISTORY — PX: UPPER GASTROINTESTINAL ENDOSCOPY: SHX188

## 2018-01-03 MED ORDER — SODIUM CHLORIDE 0.9 % IV SOLN: 500.0000 mL | Freq: Once | INTRAVENOUS | Status: DC

## 2018-01-03 NOTE — Progress Notes (Signed)
Called to room to assist during endoscopic procedure.  Patient ID and intended procedure confirmed with present staff. Received instructions for my participation in the procedure from the performing physician.  

## 2018-01-03 NOTE — Progress Notes (Signed)
To recovery, report to RN, VSS. 

## 2018-01-03 NOTE — Patient Instructions (Signed)
Please read handouts provided. Please follow Dilation diet. Continue present medications. Repeat Upper Endoscopy Monday, Sept., 30, 2019, at 10:00 am. Pre-visit is scheduled for Sept. 13, 2019 at 11:00 am.     YOU HAD AN ENDOSCOPIC PROCEDURE TODAY AT THE [facility]:   Refer to the procedure report that was given to you for any specific questions about what was found during the examination.  If the procedure report does not answer your questions, please call your gastroenterologist to clarify.  If you requested that your care partner not be given the details of your procedure findings, then the procedure report has been included in a sealed envelope for you to review at your convenience later.  YOU SHOULD EXPECT: Some feelings of bloating in the abdomen. Passage of more gas than usual.  Walking can help get rid of the air that was put into your GI tract during the procedure and reduce the bloating. If you had a lower endoscopy (such as a colonoscopy or flexible sigmoidoscopy) you may notice spotting of blood in your stool or on the toilet paper. If you underwent a bowel prep for your procedure, you may not have a normal bowel movement for a few days.  Please Note:  You might notice some irritation and congestion in your nose or some drainage.  This is from the oxygen used during your procedure.  There is no need for concern and it should clear up in a day or so.  SYMPTOMS TO REPORT IMMEDIATELY:     Following upper endoscopy (EGD)  Vomiting of blood or coffee ground material  New chest pain or pain under the shoulder blades  Painful or persistently difficult swallowing  New shortness of breath  Fever of 100F or higher  Black, tarry-looking stools  For urgent or emergent issues, a gastroenterologist can be reached at any hour by calling [phone].   DIET:  Drink plenty of fluids but you should avoid alcoholic beverages for 24 hours.  ACTIVITY:  You should plan to take  it easy for the rest of today and you should NOT DRIVE or use heavy machinery until tomorrow (because of the sedation medicines used during the test).    FOLLOW UP: Our staff will call the number listed on your records the next business day following your procedure to check on you and address any questions or concerns that you may have regarding the information given to you following your procedure. If we do not reach you, we will leave a message.  However, if you are feeling well and you are not experiencing any problems, there is no need to return our call.  We will assume that you have returned to your regular daily activities without incident.  If any biopsies were taken you will be contacted by phone or by letter within the next 1-3 weeks.  Please call us at [phone] if you have not heard about the biopsies in 3 weeks.    SIGNATURES/CONFIDENTIALITY: You and/or your care partner have signed paperwork which will be entered into your electronic medical record.  These signatures attest to the fact that that the information above on your After Visit Summary has been reviewed and is understood.  Full responsibility of the confidentiality of this discharge information lies with you and/or your care-partner.

## 2018-01-03 NOTE — Telephone Encounter (Signed)
Dr. Marina GoodellPerry, This patient is scheduled for a Previsit tomorrow , 01/04/18 for her next planned EGD with Dil. On 9/30. Do you want this patient to hold the Pletal and for how many days prior to procedure.(she held Pletal x one day prior to today's procedure) Thank You, Clydie BraunKaren

## 2018-01-03 NOTE — Op Note (Signed)
[facility] Patient Name: Jennifer [name] Procedure Date: 01/03/2018 11:06 AM MRN: [id number] Endoscopist: Wilhemina Bonito. Marina Goodell , MD Age: 77 Referring MD:  Date of Birth: Dec 23, 1940 Gender: Female Account #: [id number] Procedure:                Upper GI endoscopy, With balloon dilation of the                            esophagus (proximally 15 mm) Indications:              Dysphagia, Esophageal reflux Medicines:                Monitored Anesthesia Care Procedure:                Pre-Anesthesia Assessment:                           - Prior to the procedure, a History and Physical                            was performed, and patient medications and                            allergies were reviewed. The patient's tolerance of                            previous anesthesia was also reviewed. The risks                            and benefits of the procedure and the sedation                            options and risks were discussed with the patient.                            All questions were answered, and informed consent                            was obtained. Prior Anticoagulants: The patient has                            taken no previous anticoagulant or antiplatelet                            agents. ASA Grade Assessment: II - A patient with                            mild systemic disease. After reviewing the risks                            and benefits, the patient was deemed in                            satisfactory condition to undergo the procedure.  After obtaining informed consent, the endoscope was                            passed under direct vision. Throughout the                            procedure, the patient's blood pressure, pulse, and                            oxygen saturations were monitored continuously. The                            Endoscope was introduced through the mouth, and                            advanced to the  second part of duodenum. The upper                            GI endoscopy was accomplished without difficulty.                            The patient tolerated the procedure well. Scope In: Scope Out: Findings:                 One benign-appearing, intrinsic severe stenosis was                            found 30 cm from the incisors. This stenosis                            measured 6 mm (inner diameter) x less than one cm                            (in length). There was associated esophagitis. The                            stenosis was traversed after dilation. A TTS                            dilator was passed through the scope. Dilation with                            an 18-19-20 mm balloon dilator was performed to 15                            mm (Larger balloon available but under insufflated                            to achieve result).                           The stomach Revealed a large hiatal hernia (8 cm)  but was otherwise normal.                           The examined duodenum was normal.                           The cardia and gastric fundus were normal on                            retroflexion, Save hiatal hernia. Complications:            No immediate complications. Estimated Blood Loss:     Estimated blood loss: none. Impression:               1. GERD with esophagitis and high-grade distal                            esophageal stricture Status post dilation to                            approximately 15 mm                           2. Large hiatal hernia                           3. Otherwise normal exam. Recommendation:           - Patient has a contact number available for                            emergencies. The signs and symptoms of potential                            delayed complications were discussed with the                            patient. Return to normal activities tomorrow.                            Written  discharge instructions were provided to the                            patient.                           - Postdilation diet.                           - Continue present medications.                           - Please schedule repeat upper endoscopy with                            esophageal dilation with Dr. Marina Goodell in the [facility] in  2-4 weeks Wilhemina BonitoJohn N. Marina GoodellPerry, MD 01/03/2018 11:36:45 AM This report has been signed electronically.

## 2018-01-04 ENCOUNTER — Telehealth: Payer: Self-pay

## 2018-01-04 ENCOUNTER — Ambulatory Visit (AMBULATORY_SURGERY_CENTER): Payer: Self-pay | Admitting: *Deleted

## 2018-01-04 VITALS — Ht 64.0 in | Wt 95.0 lb

## 2018-01-04 DIAGNOSIS — K222 Esophageal obstruction: Secondary | ICD-10-CM

## 2018-01-04 DIAGNOSIS — R1310 Dysphagia, unspecified: Secondary | ICD-10-CM

## 2018-01-04 DIAGNOSIS — R1319 Other dysphagia: Secondary | ICD-10-CM

## 2018-01-04 NOTE — Telephone Encounter (Signed)
Hold 3 days. Thanks

## 2018-01-04 NOTE — Telephone Encounter (Signed)
  Follow up Call-  Call back number 01/03/2018  Post procedure Call Back phone  # #[phone] hm  Permission to leave phone message Yes  Some recent data might be hidden     Patient questions:  Do you have a fever, pain , or abdominal swelling? No. Pain Score  0 *  Have you tolerated food without any problems? Yes.    Have you been able to return to your normal activities? Yes.    Do you have any questions about your discharge instructions: Diet   No. Medications  No. Follow up visit  No.  Do you have questions or concerns about your Care? No.  Actions: * If pain score is 4 or above: No action needed, pain <4.

## 2018-01-04 NOTE — Progress Notes (Signed)
Patient denies any changes in medical,surgical hx since EGD yesterday! Patient denies any allergies to eggs or soy. Patient denies any problems with anesthesia/sedation. Patient denies any oxygen use at home. Patient denies taking any diet/weight loss. Pletal to hold 3 days, pt notified.

## 2018-01-04 NOTE — Telephone Encounter (Signed)
Noted. Thanks! Patient notified during PV today.

## 2018-01-07 ENCOUNTER — Encounter: Payer: Self-pay | Admitting: Internal Medicine

## 2018-01-09 ENCOUNTER — Encounter: Payer: Self-pay | Admitting: Internal Medicine

## 2018-01-09 DIAGNOSIS — K449 Diaphragmatic hernia without obstruction or gangrene: Secondary | ICD-10-CM | POA: Insufficient documentation

## 2018-01-21 ENCOUNTER — Ambulatory Visit (AMBULATORY_SURGERY_CENTER): Payer: Medicare Other | Admitting: Internal Medicine

## 2018-01-21 ENCOUNTER — Encounter: Payer: Self-pay | Admitting: Internal Medicine

## 2018-01-21 VITALS — BP 121/70 | HR 70 | Temp 98.9°F | Resp 14 | Ht 64.0 in | Wt 95.0 lb

## 2018-01-21 DIAGNOSIS — K222 Esophageal obstruction: Secondary | ICD-10-CM

## 2018-01-21 DIAGNOSIS — K2100 Gastro-esophageal reflux disease with esophagitis, without bleeding: Secondary | ICD-10-CM

## 2018-01-21 DIAGNOSIS — R1310 Dysphagia, unspecified: Secondary | ICD-10-CM

## 2018-01-21 DIAGNOSIS — R1319 Other dysphagia: Secondary | ICD-10-CM

## 2018-01-21 MED ORDER — SODIUM CHLORIDE 0.9 % IV SOLN: 500.0000 mL | Freq: Once | INTRAVENOUS | Status: DC

## 2018-01-21 NOTE — Progress Notes (Signed)
Called to room to assist during endoscopic procedure.  Patient ID and intended procedure confirmed with present staff. Received instructions for my participation in the procedure from the performing physician.  

## 2018-01-21 NOTE — Progress Notes (Signed)
A and O x3. Report to RN. Tolerated MAC anesthesia well Gums unchanged after procedure.

## 2018-01-21 NOTE — Patient Instructions (Signed)
YOU HAD AN ENDOSCOPIC PROCEDURE TODAY AT THE [facility]:   Refer to the procedure report that was given to you for any specific questions about what was found during the examination.  If the procedure report does not answer your questions, please call your gastroenterologist to clarify.  If you requested that your care partner not be given the details of your procedure findings, then the procedure report has been included in a sealed envelope for you to review at your convenience later.  YOU SHOULD EXPECT: Some feelings of bloating in the abdomen. Passage of more gas than usual.  Walking can help get rid of the air that was put into your GI tract during the procedure and reduce the bloating. If you had a lower endoscopy (such as a colonoscopy or flexible sigmoidoscopy) you may notice spotting of blood in your stool or on the toilet paper. If you underwent a bowel prep for your procedure, you may not have a normal bowel movement for a few days.  Please Note:  You might notice some irritation and congestion in your nose or some drainage.  This is from the oxygen used during your procedure.  There is no need for concern and it should clear up in a day or so.  SYMPTOMS TO REPORT IMMEDIATELY:   Following upper endoscopy (EGD)  Vomiting of blood or coffee ground material  New chest pain or pain under the shoulder blades  Painful or persistently difficult swallowing  New shortness of breath  Fever of 100F or higher  Black, tarry-looking stools  For urgent or emergent issues, a gastroenterologist can be reached at any hour by calling [phone].   DIET:  Follow post dilation diet.  Drink plenty of fluids but you should avoid alcoholic beverages for 24 hours.  ACTIVITY:  You should plan to take it easy for the rest of today and you should NOT DRIVE or use heavy machinery until tomorrow (because of the sedation medicines used during the test).    FOLLOW UP: Our staff will call the  number listed on your records the next business day following your procedure to check on you and address any questions or concerns that you may have regarding the information given to you following your procedure. If we do not reach you, we will leave a message.  However, if you are feeling well and you are not experiencing any problems, there is no need to return our call.  We will assume that you have returned to your regular daily activities without incident.  If any biopsies were taken you will be contacted by phone or by letter within the next 1-3 weeks.  Please call us at [phone] if you have not heard about the biopsies in 3 weeks.   Follow post dilation diet Resume Pletal today Continue omeprazole 40 mg daily Please make a office appointment with Dr. Marina Goodell in 3 months. Contact the office should you have any swallowing problems   SIGNATURES/CONFIDENTIALITY: You and/or your care partner have signed paperwork which will be entered into your electronic medical record.  These signatures attest to the fact that that the information above on your After Visit Summary has been reviewed and is understood.  Full responsibility of the confidentiality of this discharge information lies with you and/or your care-partner.

## 2018-01-21 NOTE — Op Note (Signed)
[facility] Patient Name: Jennifer [name] Procedure Date: 01/21/2018 10:29 AM MRN: [id number] Endoscopist: Wilhemina Bonito. Marina Goodell , MD Age: 77 Referring MD:  Date of Birth: Jul 31, 1940 Gender: Female Account #: [id number] Procedure:                Upper GI endoscopy with balloon dilation of the                            esophagus 16-18 mm Indications:              Dysphagia, Therapeutic procedure, Follow-up of                            reflux esophagitis. Index EGD with dilation                            January 03, 2018, now for follow-up Medicines:                Monitored Anesthesia Care Procedure:                Pre-Anesthesia Assessment:                           - Prior to the procedure, a History and Physical                            was performed, and patient medications and                            allergies were reviewed. The patient's tolerance of                            previous anesthesia was also reviewed. The risks                            and benefits of the procedure and the sedation                            options and risks were discussed with the patient.                            All questions were answered, and informed consent                            was obtained. Prior Anticoagulants: The patient has                            taken Pletal (cilostazol), last dose was 3 days                            prior to procedure. ASA Grade Assessment: II - A                            patient with mild systemic disease. After reviewing  the risks and benefits, the patient was deemed in                            satisfactory condition to undergo the procedure.                           After obtaining informed consent, the endoscope was                            passed under direct vision. Throughout the                            procedure, the patient's blood pressure, pulse, and                            oxygen  saturations were monitored continuously. The                            Endoscope was introduced through the mouth, and                            advanced to the second part of duodenum. The                            Endoscope was introduced through the mouth, and                            advanced to the second part of duodenum. The upper                            GI endoscopy was accomplished without difficulty.                            The patient tolerated the procedure well. Scope In: Scope Out: Findings:                 One benign-appearing, intrinsic moderate stenosis                            was found 30 cm from the incisors. This stenosis                            measured 1.1 cm (inner diameter). A TTS dilator was                            passed through the scope. Dilation with a 16-17-18                            mm balloon dilator was performed to 18 mm.                           The exam of the esophagus was otherwise normal.  Previous esophagitis had healed.                           The stomach revealed a large hiatal hernia                            measuring approximately 8 cm.                           The examined duodenum was normal.                           The cardia and gastric fundus were normal on                            retroflexion. Complications:            No immediate complications. Estimated Blood Loss:     Estimated blood loss: none. Impression:               - Benign-appearing esophageal stenosis. Dilated.                           -Large hiatal hernia. Otherwise normal stomach.                           - Normal examined duodenum.                           - No specimens collected. Recommendation:           1. Post dilation diet                           2. Continue omeprazole 40 mg daily                           3. Resume Pletal today                           4. Please make an office follow-up appointment  with                            Dr. Marina Goodell in 3 months. Contact the office in the                            interim should you experience recurrent problems                            with swallowing difficulties. Wilhemina Bonito. Marina Goodell, MD 01/21/2018 10:52:48 AM This report has been signed electronically.

## 2018-01-22 ENCOUNTER — Telehealth: Payer: Self-pay | Admitting: *Deleted

## 2018-01-22 NOTE — Telephone Encounter (Signed)
  Follow up Call-  Call back number 01/21/2018 01/03/2018  Post procedure Call Back phone  # [phone] #[phone] hm  Permission to leave phone message Yes Yes  Some recent data might be hidden     Patient questions:  Do you have a fever, pain , or abdominal swelling? No. Pain Score  0 *  Have you tolerated food without any problems? Yes.    Have you been able to return to your normal activities? Yes.    Do you have any questions about your discharge instructions: Diet   No. Medications  No. Follow up visit  No.  Do you have questions or concerns about your Care? No.  Actions: * If pain score is 4 or above: No action needed, pain <4.

## 2018-01-23 ENCOUNTER — Ambulatory Visit: Payer: Medicare Other | Admitting: Cardiovascular Disease

## 2018-01-23 ENCOUNTER — Encounter: Payer: Self-pay | Admitting: Cardiovascular Disease

## 2018-01-23 VITALS — BP 116/82 | HR 89 | Ht 64.0 in | Wt 96.4 lb

## 2018-01-23 DIAGNOSIS — I351 Nonrheumatic aortic (valve) insufficiency: Secondary | ICD-10-CM | POA: Diagnosis not present

## 2018-01-23 DIAGNOSIS — I739 Peripheral vascular disease, unspecified: Secondary | ICD-10-CM | POA: Diagnosis not present

## 2018-01-23 DIAGNOSIS — I6523 Occlusion and stenosis of bilateral carotid arteries: Secondary | ICD-10-CM

## 2018-01-23 NOTE — Progress Notes (Signed)
Chief Complaint  Patient presents with  . Follow-up    CAD   History of Present Illness: 77 yo female with history of PAD, tobacco abuse, HTN and hyperlipidemia who is here today for PV follow up. I saw her as a new pt in November 2011 for evaluation of PAD. Initial non-invasive studies showed chronic total occlusion of the left SFA with a long segment of total occlusion, reconstitution above the knee. There is moderate disease in the right SFA. Her initial ABI in 2012 was moderately reduced at 0.76 on the right and 0.60 on the left. I recommended that we pursue a diagnostic angiogram if she wished but she refused. Her PAD has been stable over the past 8 years. Most recent non-invasive studies June 2019 with right ABI 0.93 and left ABI 0.78. Carotid artery dopplers June 2019 with 40-59% LICA, 0-39% RICA stenosis. She has been on ASA and Pletal. Echo September 2018 with normal LV systolic function, LVEF=55-60%. Grade 1 diastolic dysfunction. Mild AI.   She is here today for follow up. The patient denies any chest pain, dyspnea, palpitations, lower extremity edema, orthopnea, PND, dizziness, near syncope or syncope. She has no leg pain if walking a city block. No rest pain or ulceration.   Primary Care Physician: Pincus Sanes, MD  Past Medical History:  Diagnosis Date  . ANEMIA-NOS   . Cataract    right cataract removed  . Clotting disorder (HCC)    Pletal  . Depression    no longer taking meds/ resolved  . DYSLIPIDEMIA   . GERD (gastroesophageal reflux disease)   . Hemorrhoids   . HYPERTENSION   . Negative colorectal cancer screening using Cologuard test   . Osteoporosis, senile    DEXA 03/2014: -3.3 R fem  . Peripheral vascular disease, unspecified (HCC) dx 02/2010 ABI  . SMOKER     Past Surgical History:  Procedure Laterality Date  . CATARACT EXTRACTION W/ INTRAOCULAR LENS IMPLANT Right 03/12/14   groat  . HEMORRHOID SURGERY  05/18/2011   Procedure: HEMORRHOIDECTOMY;   Surgeon: Robyne Askew, MD;  Location: [facility];  Service: General;  Laterality: N/A;  hemorrhoidectomy, three columns, internal and external  . TONSILLECTOMY  1946  . TUBAL LIGATION  1979  . UPPER GASTROINTESTINAL ENDOSCOPY  01/03/2018    Current Outpatient Medications  Medication Sig Dispense Refill  . ALPRAZolam (XANAX) 0.25 MG tablet TAKE 1 TABLET BY MOUTH EVERY NIGHT AT BEDTIME AS NEEDED FOR ANXIETY OR SLEEP 30 tablet 0  . amLODipine (NORVASC) 5 MG tablet TAKE 1 TABLET BY MOUTH EVERY NIGHT AT BEDTIME 90 tablet 1  . aspirin 81 MG tablet Take 81 mg by mouth daily before breakfast.     . atorvastatin (LIPITOR) 20 MG tablet TAKE 1 TABLET BY MOUTH ONCE DAILY 90 tablet 1  . calcium-vitamin D (OSCAL WITH D) 500-200 MG-UNIT per tablet Take 2 tablets by mouth daily.    . cilostazol (PLETAL) 50 MG tablet TAKE 1 TABLET(50 MG) BY MOUTH TWICE DAILY 180 tablet 1  . omeprazole (PRILOSEC) 40 MG capsule Take 1 capsule (40 mg total) by mouth daily. 90 capsule 3  . PARoxetine (PAXIL) 10 MG tablet Take 10 mg by mouth every other day.    . potassium chloride SA (K-DUR,KLOR-CON) 20 MEQ tablet TAKE 1 TABLET(20 MEQ) BY MOUTH DAILY 90 tablet 3   Current Facility-Administered Medications  Medication Dose Route Frequency Provider Last Rate Last Dose  . 0.9 %  sodium chloride infusion  500 mL Intravenous Once Hilarie Fredrickson, MD        No Known Allergies  Social History   Socioeconomic History  . Marital status: Single    Spouse name: Not on file  . Number of children: Not on file  . Years of education: Not on file  . Highest education level: Not on file  Occupational History  . Occupation: retired  Engineer, production  . Financial resource strain: Not on file  . Food insecurity:    Worry: Not on file    Inability: Not on file  . Transportation needs:    Medical: Not on file    Non-medical: Not on file  Tobacco Use  . Smoking status: Current Every Day Smoker    Packs/day: 0.50    Years: 62.00    Pack  years: 31.00    Types: Cigarettes  . Smokeless tobacco: Never Used  Substance and Sexual Activity  . Alcohol use: Yes    Alcohol/week: 0.0 standard drinks    Comment: Social- 1 scotch, 1 glass wine weekly  . Drug use: No  . Sexual activity: Not on file  Lifestyle  . Physical activity:    Days per week: Not on file    Minutes per session: Not on file  . Stress: Not on file  Relationships  . Social connections:    Talks on phone: Not on file    Gets together: Not on file    Attends religious service: Not on file    Active member of club or organization: Not on file    Attends meetings of clubs or organizations: Not on file    Relationship status: Not on file  . Intimate partner violence:    Fear of current or ex partner: Not on file    Emotionally abused: Not on file    Physically abused: Not on file    Forced sexual activity: Not on file  Other Topics Concern  . Not on file  Social History Narrative   Single, lives alone. Retired Pensions consultant. Enjoys Agricultural consultant work.    Family History  Problem Relation Age of Onset  . Breast cancer Mother   . Lung cancer Father   . Hypertension Brother   . Colon cancer Neg Hx   . Esophageal cancer Neg Hx   . Stomach cancer Neg Hx   . Rectal cancer Neg Hx     Review of Systems:  As stated in the HPI and otherwise negative.   BP 116/82   Pulse 89   Ht 5\' 4"  (1.626 m)   Wt 96 lb 6.4 oz (43.7 kg)   SpO2 97%   BMI 16.55 kg/m   Physical Examination:  General: Well developed, well nourished, NAD  HEENT: OP clear, mucus membranes moist  SKIN: warm, dry. No rashes. Neuro: No focal deficits  Musculoskeletal: Muscle strength 5/5 all ext  Psychiatric: Mood and affect normal  Neck: No JVD, no carotid bruits, no thyromegaly, no lymphadenopathy.  Lungs:Clear bilaterally, no wheezes, rhonci, crackles Cardiovascular: Regular rate and rhythm. No murmurs, gallops or rubs. Abdomen:Soft. Bowel sounds present. Non-tender.  Extremities:  No lower extremity edema. Pulses are 2 + in the bilateral DP/PT.  Echo September 2018: Left ventricle: There was mild focal basal hypertrophy of the   septum. Systolic function was normal. The estimated ejection   fraction was in the range of 55% to 60%. Doppler parameters are   consistent with abnormal left ventricular relaxation (grade 1   diastolic dysfunction). -  Aortic valve: There was mild regurgitation.   EKG:  EKG is ordered today. The ekg ordered today demonstrates Sinus rate 89 bpm. Poor R wave progression.   Recent Labs: 05/04/2017: Hemoglobin 12.7; Platelets 326.0; TSH 3.40 10/31/2017: ALT 7; BUN 13; Creatinine, Ser 0.82; Potassium 4.2; Sodium 132   Lipid Panel    Component Value Date/Time   CHOL 190 05/04/2017 1502   TRIG 95.0 05/04/2017 1502   HDL 76.70 05/04/2017 1502   CHOLHDL 2 05/04/2017 1502   VLDL 19.0 05/04/2017 1502   LDLCALC 95 05/04/2017 1502   LDLDIRECT 232.1 08/09/2009 1125     Wt Readings from Last 3 Encounters:  01/23/18 96 lb 6.4 oz (43.7 kg)  01/21/18 95 lb (43.1 kg)  01/04/18 95 lb (43.1 kg)     Other studies Reviewed: Additional studies/ records that were reviewed today include: . Review of the above records demonstrates:    Assessment and Plan:   1. PAD:  Stable by dopplers June 2019. She has stable claudication. Will conitnue to follow conservatively. Continue ASA and Pletal. Will repeat dopplers June 2020.    2. Carotid artery disease: Stable moderate LICA stenosis and mild RICA stenosis by dopplers June 2019. Repeat one year.   3. Tobacco abuse: I have asked her to stop smoking.   4. Aortic valve insufficiency: Mild AI by echo September 2018.    She understands the risk of CAD and progression of her PAD is she continues smoking.   Current medicines are reviewed at length with the patient today.  The patient does not have concerns regarding medicines.  The following changes have been made:  no change  Labs/ tests ordered today  include:   Orders Placed This Encounter  Procedures  . EKG 12-Lead    Disposition:   FU with me in 12  months  Signed, Verne Carrow, MD 01/23/2018 3:38 PM    [facility] [address] [place], [place], [place]  [address] Phone: [phone]; Fax: [phone]

## 2018-01-23 NOTE — Patient Instructions (Signed)
Medication Instructions:  Your physician recommends that you continue on your current medications as directed. Please refer to the Current Medication list given to you today.   Labwork: none  Testing/Procedures: Your physician has requested that you have a carotid duplex. This test is an ultrasound of the carotid arteries in your neck. It looks at blood flow through these arteries that supply the brain with blood. Allow one hour for this exam. There are no restrictions or special instructions.  To be done in June 2020  Your physician has requested that you have a lower or upper extremity arterial duplex. This test is an ultrasound of the arteries in the legs or arms. It looks at arterial blood flow in the legs and arms. Allow one hour for Lower and Upper Arterial scans. There are no restrictions or special instructions  To be done in June 2020  Follow-Up: Your physician recommends that you schedule a follow-up appointment in: 12 months. Please call our office in about 8 months to schedule this appointment    Any Other Special Instructions Will Be Listed Below (If Applicable).     If you need a refill on your cardiac medications before your next appointment, please call your pharmacy.

## 2018-02-21 ENCOUNTER — Ambulatory Visit (INDEPENDENT_AMBULATORY_CARE_PROVIDER_SITE_OTHER): Payer: Medicare Other | Admitting: *Deleted

## 2018-02-21 DIAGNOSIS — Z23 Encounter for immunization: Secondary | ICD-10-CM

## 2018-02-21 DIAGNOSIS — M810 Age-related osteoporosis without current pathological fracture: Secondary | ICD-10-CM | POA: Diagnosis not present

## 2018-02-21 MED ORDER — DENOSUMAB 60 MG/ML ~~LOC~~ SOSY
60.0000 mg | PREFILLED_SYRINGE | Freq: Once | SUBCUTANEOUS | Status: AC
  Administered 2018-02-21: 60 mg via SUBCUTANEOUS

## 2018-02-25 ENCOUNTER — Other Ambulatory Visit: Payer: Self-pay | Admitting: Internal Medicine

## 2018-02-25 NOTE — Telephone Encounter (Signed)
Last refill was 12/31/17 Las OV was 10/31/17 Next OV is 05/03/18

## 2018-03-01 DIAGNOSIS — H2512 Age-related nuclear cataract, left eye: Secondary | ICD-10-CM | POA: Diagnosis not present

## 2018-03-06 ENCOUNTER — Other Ambulatory Visit: Payer: Self-pay | Admitting: Internal Medicine

## 2018-04-08 ENCOUNTER — Ambulatory Visit (INDEPENDENT_AMBULATORY_CARE_PROVIDER_SITE_OTHER): Payer: Medicare Other | Admitting: Internal Medicine

## 2018-04-08 ENCOUNTER — Encounter: Payer: Self-pay | Admitting: Internal Medicine

## 2018-04-08 VITALS — BP 152/80 | HR 72 | Ht 64.0 in | Wt 96.4 lb

## 2018-04-08 DIAGNOSIS — K222 Esophageal obstruction: Secondary | ICD-10-CM

## 2018-04-08 DIAGNOSIS — K2100 Gastro-esophageal reflux disease with esophagitis, without bleeding: Secondary | ICD-10-CM

## 2018-04-08 DIAGNOSIS — R1319 Other dysphagia: Secondary | ICD-10-CM

## 2018-04-08 DIAGNOSIS — R1310 Dysphagia, unspecified: Secondary | ICD-10-CM

## 2018-04-08 MED ORDER — OMEPRAZOLE 40 MG PO CPDR: 40.0000 mg | DELAYED_RELEASE_CAPSULE | Freq: Every day | ORAL | 3 refills | Status: DC

## 2018-04-08 NOTE — Patient Instructions (Signed)
We have sent the following medications to your pharmacy for you to pick up at your convenience:  Omeprazole.  Please follow up in one year  

## 2018-04-08 NOTE — Progress Notes (Signed)
HISTORY OF PRESENT ILLNESS:  Jennifer [name] is a 77 y.o. female who was initially evaluated January 02, 2018 regarding intermittent solid food dysphasia.  See that dictation for details.  She subsequently underwent upper endoscopy and was found to have erosive esophagitis and stricture which was high-grade and dilated with the endoscope.  She was continued on omeprazole 40 mg daily.  Repeat upper endoscopy January 21, 2018.  Mucosa had less inflammation.  Her stricture was dilated to a maximal diameter of 18 mm via balloon.  She follows up at this time.  She continues on omeprazole 40 mg daily.  No GI complaints.  REVIEW OF SYSTEMS:  All non-GI ROS negative entirely  Past Medical History:  Diagnosis Date  . ANEMIA-NOS   . Cataract    right cataract removed  . Clotting disorder (HCC)    Pletal  . Depression    no longer taking meds/ resolved  . DYSLIPIDEMIA   . GERD (gastroesophageal reflux disease)   . Hemorrhoids   . HYPERTENSION   . Negative colorectal cancer screening using Cologuard test   . Osteoporosis, senile    DEXA 03/2014: -3.3 R fem  . Peripheral vascular disease, unspecified (HCC) dx 02/2010 ABI  . SMOKER     Past Surgical History:  Procedure Laterality Date  . CATARACT EXTRACTION W/ INTRAOCULAR LENS IMPLANT Right 03/12/14   groat  . HEMORRHOID SURGERY  05/18/2011   Procedure: HEMORRHOIDECTOMY;  Surgeon: Robyne AskewPaul S Toth III, MD;  Location: [facility];  Service: General;  Laterality: N/A;  hemorrhoidectomy, three columns, internal and external  . TONSILLECTOMY  1946  . TUBAL LIGATION  1979  . UPPER GASTROINTESTINAL ENDOSCOPY  01/03/2018    Social History Jennifer [name]  reports that she has been smoking cigarettes. She has a 31.00 pack-year smoking history. She has never used smokeless tobacco. She reports current alcohol use. She reports that she does not use drugs.  family history includes Breast cancer in her mother; Hypertension in her brother; Lung cancer in  her father.  No Known Allergies     PHYSICAL EXAMINATION: Vital signs: BP (!) 152/80   Pulse 72   Ht 5\' 4"  (1.626 m)   Wt 96 lb 6 oz (43.7 kg)   BMI 16.54 kg/m   Constitutional: generally well-appearing, no acute distress Psychiatric: alert and oriented x3, cooperative Abdomen: Not reexamined Neuro: No gross abnormalities  ASSESSMENT:  1.  GERD complicated by erosive esophagitis and peptic stricture.  Currently asymptomatic post dilation on PPI   PLAN:  1.  Reflux precautions 2.  Continue omeprazole 40 mg daily.  Prescription with multiple refills electronically submitted 3.  Routine office follow-up 1 year.  She knows to contact the office in the interim for any questions or problems with recurrent dysphasia  15-minute spent face-to-face with the patient.  Greater than 50% of the time used for counseling regarding her GERD with associated complications

## 2018-04-15 ENCOUNTER — Other Ambulatory Visit: Payer: Self-pay | Admitting: Internal Medicine

## 2018-04-15 NOTE — Telephone Encounter (Signed)
Check [place] registry last filled 02/25/2018.MD is out of the office pls advise./lmb

## 2018-05-02 NOTE — Patient Instructions (Addendum)
  Medications reviewed and updated.  Changes include :   none    Please followup in 6 months   

## 2018-05-02 NOTE — Progress Notes (Signed)
Subjective:    Patient ID: Jennifer [name], female    DOB: 12/27/1940, 78 y.o.   MRN: [id number]  HPI The patient is here for follow up.  Hypertension: She is taking her medication daily. She is compliant with a low sodium diet.  She denies chest pain, palpitations, edema, shortness of breath and regular headaches. She is exercising regularly - walking the dog.  She does not monitor her blood pressure at home.    Prediabetes:  She is compliant with a low sugar/carbohydrate diet.  She is exercising regularly.  Hyperlipidemia: She is taking her medication daily. She is compliant with a low fat/cholesterol diet. She is exercising regularly. She denies myalgias.   Anxiety: She is taking her medication daily as prescribed. She denies any side effects from the medication. She feels her anxiety is well controlled and she is happy with her current dose of medication.   OP:  She is getting prolia every 6 months.  She walks the dog.  She is taking her calcium and vitamin d daily.    Medications and allergies reviewed with patient and updated if appropriate.  Patient Active Problem List   Diagnosis Date Noted  . Hiatal hernia, large 01/09/2018  . Dysphagia 10/31/2017  . GERD (gastroesophageal reflux disease) 12/08/2016  . History of Clostridium difficile infection 11/29/2016  . Prediabetes 11/02/2016  . Carotid arterial disease (HCC) 10/20/2015  . Anxiety state   . PAD (peripheral artery disease) (HCC) 10/10/2011  . Osteoporosis, senile 05/17/2011  . Dyslipidemia 05/03/2009  . SMOKER 04/14/2009  . Essential hypertension 04/14/2009    Current Outpatient Medications on File Prior to Visit  Medication Sig Dispense Refill  . ALPRAZolam (XANAX) 0.25 MG tablet TAKE 1 TABLET BY MOUTH EVERY NIGHT AT BEDTIME AS NEEDED FOR ANXIETY OR SLEEP 30 tablet 0  . amLODipine (NORVASC) 5 MG tablet TAKE 1 TABLET BY MOUTH EVERY NIGHT AT BEDTIME 90 tablet 0  . aspirin 81 MG tablet Take 81 mg by mouth daily  before breakfast.     . atorvastatin (LIPITOR) 20 MG tablet TAKE 1 TABLET BY MOUTH ONCE DAILY 90 tablet 1  . calcium-vitamin D (OSCAL WITH D) 500-200 MG-UNIT per tablet Take 2 tablets by mouth daily.    . cilostazol (PLETAL) 50 MG tablet TAKE 1 TABLET(50 MG) BY MOUTH TWICE DAILY 180 tablet 1  . omeprazole (PRILOSEC) 40 MG capsule Take 1 capsule (40 mg total) by mouth daily. 90 capsule 3  . PARoxetine (PAXIL) 10 MG tablet Take 10 mg by mouth every other day.    . potassium chloride SA (K-DUR,KLOR-CON) 20 MEQ tablet TAKE 1 TABLET(20 MEQ) BY MOUTH DAILY 90 tablet 3   No current facility-administered medications on file prior to visit.     Past Medical History:  Diagnosis Date  . ANEMIA-NOS   . Cataract    right cataract removed  . Clotting disorder (HCC)    Pletal  . Depression    no longer taking meds/ resolved  . DYSLIPIDEMIA   . GERD (gastroesophageal reflux disease)   . Hemorrhoids   . HYPERTENSION   . Negative colorectal cancer screening using Cologuard test   . Osteoporosis, senile    DEXA 03/2014: -3.3 R fem  . Peripheral vascular disease, unspecified (HCC) dx 02/2010 ABI  . SMOKER     Past Surgical History:  Procedure Laterality Date  . CATARACT EXTRACTION W/ INTRAOCULAR LENS IMPLANT Right 03/12/14   groat  . HEMORRHOID SURGERY  05/18/2011   Procedure:  HEMORRHOIDECTOMY;  Surgeon: Robyne Askew, MD;  Location: [facility];  Service: General;  Laterality: N/A;  hemorrhoidectomy, three columns, internal and external  . TONSILLECTOMY  1946  . TUBAL LIGATION  1979  . UPPER GASTROINTESTINAL ENDOSCOPY  01/03/2018    Social History   Socioeconomic History  . Marital status: Single    Spouse name: Not on file  . Number of children: Not on file  . Years of education: Not on file  . Highest education level: Not on file  Occupational History  . Occupation: retired  Engineer, production  . Financial resource strain: Not on file  . Food insecurity:    Worry: Not on file     Inability: Not on file  . Transportation needs:    Medical: Not on file    Non-medical: Not on file  Tobacco Use  . Smoking status: Current Every Day Smoker    Packs/day: 0.50    Years: 62.00    Pack years: 31.00    Types: Cigarettes  . Smokeless tobacco: Never Used  Substance and Sexual Activity  . Alcohol use: Yes    Alcohol/week: 0.0 standard drinks    Comment: Social- 1 scotch, 1 glass wine weekly  . Drug use: No  . Sexual activity: Not on file  Lifestyle  . Physical activity:    Days per week: Not on file    Minutes per session: Not on file  . Stress: Not on file  Relationships  . Social connections:    Talks on phone: Not on file    Gets together: Not on file    Attends religious service: Not on file    Active member of club or organization: Not on file    Attends meetings of clubs or organizations: Not on file    Relationship status: Not on file  Other Topics Concern  . Not on file  Social History Narrative   Single, lives alone. Retired Pensions consultant. Enjoys Agricultural consultant work.    Family History  Problem Relation Age of Onset  . Breast cancer Mother   . Lung cancer Father   . Hypertension Brother   . Colon cancer Neg Hx   . Esophageal cancer Neg Hx   . Stomach cancer Neg Hx   . Rectal cancer Neg Hx     Review of Systems  Constitutional: Negative for chills, fatigue and fever.  HENT: Negative for trouble swallowing.   Respiratory: Positive for cough (with recent URI). Negative for shortness of breath and wheezing.   Cardiovascular: Negative for chest pain, palpitations and leg swelling.  Neurological: Negative for light-headedness and headaches.       Objective:   Vitals:   05/03/18 1356  BP: (!) 142/84  Pulse: 72  Resp: 16  Temp: 97.8 F (36.6 C)  SpO2: 98%   BP Readings from Last 3 Encounters:  05/03/18 (!) 142/84  04/08/18 (!) 152/80  01/23/18 116/82   Wt Readings from Last 3 Encounters:  05/03/18 94 lb 12.8 oz (43 kg)  04/08/18 96  lb 6 oz (43.7 kg)  01/23/18 96 lb 6.4 oz (43.7 kg)   Body mass index is 16.27 kg/m.   Physical Exam    Constitutional: Appears well-developed and well-nourished. No distress.  HENT:  Head: Normocephalic and atraumatic.  Neck: Neck supple. No tracheal deviation present. No thyromegaly present.  No cervical lymphadenopathy Cardiovascular: Normal rate, regular rhythm and normal heart sounds.   No murmur heard. No carotid bruit .  No edema  Pulmonary/Chest: Effort normal and breath sounds normal. No respiratory distress. No has no wheezes. No rales.  Skin: Skin is warm and dry. Not diaphoretic.  Psychiatric: Normal mood and affect. Behavior is normal.      Assessment & Plan:    See Problem List for Assessment and Plan of chronic medical problems.   

## 2018-05-03 ENCOUNTER — Encounter: Payer: Self-pay | Admitting: Internal Medicine

## 2018-05-03 ENCOUNTER — Ambulatory Visit (INDEPENDENT_AMBULATORY_CARE_PROVIDER_SITE_OTHER): Payer: Medicare Other | Admitting: Internal Medicine

## 2018-05-03 VITALS — BP 142/84 | HR 72 | Temp 97.8°F | Resp 16 | Ht 64.0 in | Wt 94.8 lb

## 2018-05-03 DIAGNOSIS — E785 Hyperlipidemia, unspecified: Secondary | ICD-10-CM

## 2018-05-03 DIAGNOSIS — K219 Gastro-esophageal reflux disease without esophagitis: Secondary | ICD-10-CM

## 2018-05-03 DIAGNOSIS — R7303 Prediabetes: Secondary | ICD-10-CM | POA: Diagnosis not present

## 2018-05-03 DIAGNOSIS — I10 Essential (primary) hypertension: Secondary | ICD-10-CM | POA: Diagnosis not present

## 2018-05-03 DIAGNOSIS — F411 Generalized anxiety disorder: Secondary | ICD-10-CM | POA: Diagnosis not present

## 2018-05-03 NOTE — Assessment & Plan Note (Signed)
Check a1c Low sugar / carb diet Stressed regular exercise   

## 2018-05-03 NOTE — Assessment & Plan Note (Signed)
Controlled, stable Continue current dose of medication  

## 2018-05-03 NOTE — Assessment & Plan Note (Addendum)
GERD controlled, no dysphagia Continue daily medication  Was having some dysphasia s/p dilation

## 2018-05-03 NOTE — Assessment & Plan Note (Signed)
BP well controlled Current regimen effective and well tolerated Continue current medications at current doses cmp  

## 2018-05-03 NOTE — Assessment & Plan Note (Signed)
Check lipid panel  Continue daily statin Regular exercise and healthy diet encouraged  

## 2018-05-07 ENCOUNTER — Other Ambulatory Visit: Payer: Self-pay | Admitting: Internal Medicine

## 2018-06-05 ENCOUNTER — Other Ambulatory Visit: Payer: Self-pay | Admitting: Internal Medicine

## 2018-06-17 ENCOUNTER — Other Ambulatory Visit: Payer: Self-pay | Admitting: Internal Medicine

## 2018-06-18 NOTE — Telephone Encounter (Signed)
Check [place] registry last filled 04/16/2018.Marland KitchenRaechel Chute

## 2018-09-05 ENCOUNTER — Ambulatory Visit (INDEPENDENT_AMBULATORY_CARE_PROVIDER_SITE_OTHER): Payer: Medicare Other

## 2018-09-05 DIAGNOSIS — M810 Age-related osteoporosis without current pathological fracture: Secondary | ICD-10-CM

## 2018-09-05 MED ORDER — DENOSUMAB 60 MG/ML ~~LOC~~ SOSY
60.0000 mg | PREFILLED_SYRINGE | Freq: Once | SUBCUTANEOUS | Status: AC
  Administered 2018-09-05: 60 mg via SUBCUTANEOUS

## 2018-09-05 NOTE — Progress Notes (Signed)
prolia given.   Daemion Mcniel J Raeann Offner, MD  

## 2018-09-30 ENCOUNTER — Other Ambulatory Visit: Payer: Self-pay | Admitting: *Deleted

## 2018-10-07 ENCOUNTER — Ambulatory Visit (HOSPITAL_BASED_OUTPATIENT_CLINIC_OR_DEPARTMENT_OTHER)
Admission: RE | Admit: 2018-10-07 | Discharge: 2018-10-07 | Disposition: A | Discharge status: Home / Self Care | Payer: Medicare Other | Source: Ambulatory Visit | Attending: Cardiovascular Disease | Admitting: Cardiovascular Disease

## 2018-10-07 ENCOUNTER — Other Ambulatory Visit: Payer: Self-pay

## 2018-10-07 ENCOUNTER — Ambulatory Visit (HOSPITAL_COMMUNITY)
Admission: RE | Admit: 2018-10-07 | Discharge: 2018-10-07 | Disposition: A | Discharge status: Home / Self Care | Payer: Medicare Other | Source: Ambulatory Visit | Attending: Internal Medicine | Admitting: Internal Medicine

## 2018-10-07 ENCOUNTER — Other Ambulatory Visit (HOSPITAL_COMMUNITY): Payer: Self-pay | Admitting: Cardiovascular Disease

## 2018-10-07 DIAGNOSIS — I6523 Occlusion and stenosis of bilateral carotid arteries: Secondary | ICD-10-CM | POA: Diagnosis not present

## 2018-10-07 DIAGNOSIS — I739 Peripheral vascular disease, unspecified: Secondary | ICD-10-CM

## 2018-10-31 NOTE — Progress Notes (Signed)
Subjective:    Patient ID: Jennifer BackersSusan L [name], female    DOB: [birth year], 78 y.o.   MRN: [id number]  HPI The patient is here for follow up.  She is exercising  - walking the dog.     Hypertension: She is taking her medication daily. She is compliant with a low sodium diet.  She denies chest pain, palpitations, edema, shortness of breath and regular headaches.    Hyperlipidemia: She is taking her medication daily. She is compliant with a low fat/cholesterol diet. She denies myalgias.   Prediabetes:  She is fairly compliant with a low sugar/carbohydrate diet.  She is exercising.  Osteoporosis:  She gets the prolia Q 6 months.  She walks her dog.  She takes calcium and vitamin d.    Anxiety: She has slowly weaned herself off the Paxil and does not feel anxious.  She would like to continue off the medication and see how she does.  GERD:  She is taking her medication daily as prescribed.  She denies any GERD symptoms and feels her GERD is well controlled.   Smoking:  She does want to quit. She will try weaning herself off slowly.  She did discuss this at length with our health coach nurse.    Medications and allergies reviewed with patient and updated if appropriate.  Patient Active Problem List   Diagnosis Date Noted  . Hiatal hernia, large 01/09/2018  . Dysphagia 10/31/2017  . GERD (gastroesophageal reflux disease) 12/08/2016  . History of Clostridium difficile infection 11/29/2016  . Prediabetes 11/02/2016  . Carotid arterial disease (HCC) 10/20/2015  . Anxiety state   . PAD (peripheral artery disease) (HCC) 10/10/2011  . Osteoporosis, senile 05/17/2011  . Dyslipidemia 05/03/2009  . Tobacco abuse 04/14/2009  . Essential hypertension 04/14/2009    Current Outpatient Medications on File Prior to Visit  Medication Sig Dispense Refill  . ALPRAZolam (XANAX) 0.25 MG tablet TAKE 1 TABLET BY MOUTH EVERY NIGHT AT BEDTIME AS NEEDED FOR ANXIETY OR SLEEP 30 tablet 4  . amLODipine  (NORVASC) 5 MG tablet TAKE 1 TABLET BY MOUTH EVERY NIGHT AT BEDTIME 90 tablet 1  . aspirin 81 MG tablet Take 81 mg by mouth daily before breakfast.     . atorvastatin (LIPITOR) 20 MG tablet TAKE 1 TABLET BY MOUTH ONCE DAILY 90 tablet 1  . calcium-vitamin D (OSCAL WITH D) 500-200 MG-UNIT per tablet Take 2 tablets by mouth daily.    . cilostazol (PLETAL) 50 MG tablet TAKE 1 TABLET(50 MG) BY MOUTH TWICE DAILY 180 tablet 1  . omeprazole (PRILOSEC) 40 MG capsule Take 1 capsule (40 mg total) by mouth daily. 90 capsule 3  . potassium chloride SA (K-DUR,KLOR-CON) 20 MEQ tablet TAKE 1 TABLET(20 MEQ) BY MOUTH DAILY 90 tablet 3   No current facility-administered medications on file prior to visit.     Past Medical History:  Diagnosis Date  . ANEMIA-NOS   . Cataract    right cataract removed  . Clotting disorder (HCC)    Pletal  . Depression    no longer taking meds/ resolved  . DYSLIPIDEMIA   . GERD (gastroesophageal reflux disease)   . Hemorrhoids   . HYPERTENSION   . Negative colorectal cancer screening using Cologuard test   . Osteoporosis, senile    DEXA 03/2014: -3.3 R fem  . Peripheral vascular disease, unspecified (HCC) dx 02/2010 ABI  . SMOKER     Past Surgical History:  Procedure Laterality Date  . CATARACT EXTRACTION  W/ INTRAOCULAR LENS IMPLANT Right 03/12/14   groat  . HEMORRHOID SURGERY  05/18/2011   Procedure: HEMORRHOIDECTOMY;  Surgeon: Merrie Roof, MD;  Location: [facility];  Service: General;  Laterality: N/A;  hemorrhoidectomy, three columns, internal and external  . TONSILLECTOMY  1946  . TUBAL LIGATION  1979  . UPPER GASTROINTESTINAL ENDOSCOPY  01/03/2018    Social History   Socioeconomic History  . Marital status: Single    Spouse name: Not on file  . Number of children: Not on file  . Years of education: Not on file  . Highest education level: Not on file  Occupational History  . Occupation: retired  Scientific laboratory technician  . Financial resource strain: Not hard at  all  . Food insecurity    Worry: Never true    Inability: Never true  . Transportation needs    Medical: No    Non-medical: No  Tobacco Use  . Smoking status: Current Every Day Smoker    Packs/day: 0.50    Years: 62.00    Pack years: 31.00    Types: Cigarettes  . Smokeless tobacco: Never Used  Substance and Sexual Activity  . Alcohol use: Yes    Alcohol/week: 0.0 standard drinks    Comment: Social- 1 scotch, 1 glass wine weekly  . Drug use: No  . Sexual activity: Not Currently    Birth control/protection: Post-menopausal  Lifestyle  . Physical activity    Days per week: 5 days    Minutes per session: 30 min  . Stress: Not at all  Relationships  . Social connections    Talks on phone: More than three times a week    Gets together: More than three times a week    Attends religious service: 1 to 4 times per year    Active member of club or organization: Yes    Attends meetings of clubs or organizations: More than 4 times per year    Relationship status: Not on file  Other Topics Concern  . 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    Family History  Problem Relation Age of Onset  . Breast cancer Mother   . Lung cancer Father   . Hypertension Brother   . Colon cancer Neg Hx   . Esophageal cancer Neg Hx   . Stomach cancer Neg Hx   . Rectal cancer Neg Hx     Review of Systems  Constitutional: Negative for chills and fever.  Respiratory: Negative for cough, shortness of breath and wheezing.   Cardiovascular: Negative for chest pain, palpitations and leg swelling.  Neurological: Negative for light-headedness and headaches.       Objective:   Vitals:   11/01/18 1448  BP: (!) 152/88  Pulse: 82  Temp: 98 F (36.7 C)  SpO2: 98%   BP Readings from Last 3 Encounters:  11/01/18 (!) 152/88  11/01/18 (!) 146/84  05/03/18 (!) 142/84   Wt Readings from Last 3 Encounters:  11/01/18 97 lb (44  kg)  11/01/18 97 lb (44 kg)  05/03/18 94 lb 12.8 oz (43 kg)   Body mass index is 16.65 kg/m.   Physical Exam    Constitutional: Appears well-developed and well-nourished. No distress.  HENT:  Head: Normocephalic and atraumatic.  Neck: Neck supple. No tracheal deviation present. No thyromegaly present.  No cervical lymphadenopathy Cardiovascular: Normal rate, regular rhythm and normal heart sounds.   No murmur heard.  No carotid bruit .  No edema Pulmonary/Chest: Effort normal and breath sounds normal. No respiratory distress. No has no wheezes. No rales.  Skin: Skin is warm and dry. Not diaphoretic.  Psychiatric: Normal mood and affect. Behavior is normal.      Assessment & Plan:    See Problem List for Assessment and Plan of chronic medical problems.

## 2018-10-31 NOTE — Patient Instructions (Addendum)
  Tests ordered today. Your results will be released to MyChart (or called to you) after review.  If any changes need to be made, you will be notified at that same time.    Medications reviewed and updated.  Changes include :   none     Please followup in 6 months   

## 2018-10-31 NOTE — Progress Notes (Addendum)
Subjective:   Jennifer BackersSusan L [name] is a 78 y.o. female who presents for Medicare Annual (Subsequent) preventive examination.  Review of Systems:   Cardiac Risk Factors include: advanced age (>6155men, 42>65 women);dyslipidemia;hypertension Sleep patterns: feels rested on waking, gets up 0-1 times nightly to void and sleeps 7-8 hours nightly.    Home Safety/Smoke Alarms: Feels safe in home. Smoke alarms in place.  Living environment; residence and Firearm Safety: apartment, no firearms, firearms stored safely. Lives alone , no needs for DME, good support system Seat Belt Safety/Bike Helmet: Wears seat belt.     Objective:     Vitals: BP (!) 146/84 (BP Location: Left Arm, Cuff Size: Normal)   Pulse 77   Temp 98 F (36.7 C)   Resp 18   Ht 5\' 4"  (1.626 m)   Wt 97 lb (44 kg)   SpO2 98%   BMI 16.65 kg/m   Body mass index is 16.65 kg/m.  Advanced Directives 11/30/2016 11/29/2016 11/29/2016 03/30/2014 05/11/2011  Does Patient Have a Medical Advance Directive? Yes Yes Yes Yes Patient has advance directive, copy in chart  Type of Advance Directive Living will Living will Living will Healthcare Power of Attorney;Living will Living will  Does patient want to make changes to medical advance directive? No - Patient declined No - Patient declined - No - Patient declined -  Copy of Healthcare Power of Attorney in Chart? - - - No - copy requested -  Pre-existing out of facility DNR order (yellow form or pink MOST form) - - - - No    Tobacco Social History   Tobacco Use  Smoking Status Current Every Day Smoker  . Packs/day: 0.50  . Years: 62.00  . Pack years: 31.00  . Types: Cigarettes  Smokeless Tobacco Never Used     Ready to quit: No Counseling given: No  Past Medical History:  Diagnosis Date  . ANEMIA-NOS   . Cataract    right cataract removed  . Clotting disorder (HCC)    Pletal  . Depression    no longer taking meds/ resolved  . DYSLIPIDEMIA   . GERD (gastroesophageal reflux  disease)   . Hemorrhoids   . HYPERTENSION   . Negative colorectal cancer screening using Cologuard test   . Osteoporosis, senile    DEXA 03/2014: -3.3 R fem  . Peripheral vascular disease, unspecified (HCC) dx 02/2010 ABI  . SMOKER    Past Surgical History:  Procedure Laterality Date  . CATARACT EXTRACTION W/ INTRAOCULAR LENS IMPLANT Right 03/12/14   groat  . HEMORRHOID SURGERY  05/18/2011   Procedure: HEMORRHOIDECTOMY;  Surgeon: Robyne AskewPaul S Toth III, MD;  Location: [facility];  Service: General;  Laterality: N/A;  hemorrhoidectomy, three columns, internal and external  . TONSILLECTOMY  1946  . TUBAL LIGATION  1979  . UPPER GASTROINTESTINAL ENDOSCOPY  01/03/2018   Family History  Problem Relation Age of Onset  . Breast cancer Mother   . Lung cancer Father   . Hypertension Brother   . Colon cancer Neg Hx   . Esophageal cancer Neg Hx   . Stomach cancer Neg Hx   . Rectal cancer Neg Hx    Social History   Socioeconomic History  . Marital status: Single    Spouse name: Not on file  . Number of children: Not on file  . Years of education: Not on file  . Highest education level: Not on file  Occupational History  . Occupation: retired  Engineer, productionocial Needs  .  Financial resource strain: Not hard at all  . Food insecurity    Worry: Never true    Inability: Never true  . Transportation needs    Medical: No    Non-medical: No  Tobacco Use  . Smoking status: Current Every Day Smoker    Packs/day: 0.50    Years: 62.00    Pack years: 31.00    Types: Cigarettes  . Smokeless tobacco: Never Used  Substance and Sexual Activity  . Alcohol use: Yes    Alcohol/week: 0.0 standard drinks    Comment: Social- 1 scotch, 1 glass  weekly  . Drug use: No  . Sexual activity: Not Currently    Birth control/protection: Post-menopausal  Lifestyle  . Physical activity    Days per week: 5 days    Minutes per session: 30 min  . Stress: Not at all  Relationships  . Social connections    Talks on  phone: More than three times a week    Gets together: More than three times a week    Attends religious service: 1 to 4 times per year    Active member of club or organization: Yes    Attends meetings of clubs or organizations: More than 4 times per year    Relationship status: Not on file  Other Topics Concern  . 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    Outpatient Encounter Medications as of 11/01/2018  Medication Sig  . ALPRAZolam (XANAX) 0.25 MG tablet TAKE 1 TABLET BY MOUTH EVERY NIGHT AT BEDTIME AS NEEDED FOR ANXIETY OR SLEEP  . amLODipine (NORVASC) 5 MG tablet TAKE 1 TABLET BY MOUTH EVERY NIGHT AT BEDTIME  . aspirin 81 MG tablet Take 81 mg by mouth daily before breakfast.   . atorvastatin (LIPITOR) 20 MG tablet TAKE 1 TABLET BY MOUTH ONCE DAILY  . calcium-vitamin D (OSCAL WITH D) 500-200 MG-UNIT per tablet Take 2 tablets by mouth daily.  . cilostazol (PLETAL) 50 MG tablet TAKE 1 TABLET(50 MG) BY MOUTH TWICE DAILY  . omeprazole (PRILOSEC) 40 MG capsule Take 1 capsule (40 mg total) by mouth daily.  . potassium chloride SA (K-DUR,KLOR-CON) 20 MEQ tablet TAKE 1 TABLET(20 MEQ) BY MOUTH DAILY  . PARoxetine (PAXIL) 10 MG tablet Take 10 mg by mouth every other day.   No facility-administered encounter medications on file as of 11/01/2018.     Activities of Daily Living In your present state of health, do you have any difficulty performing the following activities: 11/01/2018  Hearing? N  Vision? N  Difficulty concentrating or making decisions? N  Walking or climbing stairs? N  Dressing or bathing? N  Doing errands, shopping? N  Preparing Food and eating ? N  Using the Toilet? N  In the past six months, have you accidently leaked urine? N  Do you have problems with loss of bowel control? N  Managing your Medications? N  Managing your Finances? N  Housekeeping or managing your Housekeeping? N  Some recent data  might be hidden    Patient Care Team: Binnie Rail, MD as PCP - General (Internal Medicine) Burnell Blanks, MD as PCP - Cardiology (Cardiology) Burnell Blanks, MD as Consulting Physician (Cardiology) Clent Jacks, MD (Ophthalmology)    Assessment:   This is a routine wellness examination for Jennifer [name]. Physical assessment deferred to PCP.   Exercise Activities and Dietary recommendations Current Exercise Habits: Home exercise routine, Type of  exercise: walking, Time (Minutes): 40, Frequency (Times/Week): 6, Weekly Exercise (Minutes/Week): 240, Intensity: Mild, Exercise limited by: None identified  Diet (meal preparation, eat out, water intake, caffeinated beverages, dairy products, fruits and vegetables): in general, a "healthy" diet   Reports poor appetite.   Discussed supplementing with premier and other supplements. Encouraged patient to increase daily water and healthy fluid intake.  Goals    . Patient Stated     Continue to be physically and socially active. Quit smoking by slowly decreasing my cigarettes over a period of time.       Fall Risk Fall Risk  11/01/2018 10/31/2017 05/02/2016 04/21/2015 03/30/2014  Falls in the past year? 0 No No No No  Number falls in past yr: 0 - - - -    Depression Screen PHQ 2/9 Scores 11/01/2018 10/31/2017 05/02/2016 04/21/2015  PHQ - 2 Score 0 0 0 0     Cognitive Function MMSE - Mini Mental State Exam 11/01/2018  Orientation to time 5  Orientation to Place 5  Registration 3  Attention/ Calculation 5  Recall 3  Language- name 2 objects 2  Language- repeat 1  Language- follow 3 step command 3  Language- read & follow direction 1  Write a sentence 1  Copy design 1  Total score 30        Immunization History  Administered Date(s) Administered  . Influenza Split 03/08/2011, 01/01/2012  . Influenza, High Dose Seasonal PF 01/13/2014, 01/12/2017, 02/21/2018  . Influenza,inj,Quad PF,6+ Mos 01/01/2013, 01/07/2015  .  Influenza-Unspecified 01/23/2016  . Pneumococcal Conjugate-13 03/30/2014  . Pneumococcal Polysaccharide-23 07/21/2013  . Td 07/21/2013   Screening Tests Health Maintenance  Topic Date Due  . DEXA SCAN  05/25/2018  . INFLUENZA VACCINE  11/23/2018  . TETANUS/TDAP  07/22/2023  . PNA vac Low Risk Adult  Completed      Plan:     Reviewed health maintenance screenings with patient today and relevant education, vaccines, and/or referrals were provided.   Continue to eat heart healthy diet (full of fruits, vegetables, whole grains, lean protein, water--limit salt, fat, and sugar intake) and increase physical activity as tolerated.  Continue doing brain stimulating activities (puzzles, reading, adult coloring books, staying active) to keep memory sharp.   I have personally reviewed and noted the following in the patient's chart:   . Medical and social history . Use of alcohol, tobacco or illicit drugs  . Current medications and supplements . Functional ability and status . Nutritional status . Physical activity . Advanced directives . List of other physicians . Vitals . Screenings to include cognitive, depression, and falls . Referrals and appointments  In addition, I have reviewed and discussed with patient certain preventive protocols, quality metrics, and best practice recommendations. A written personalized care plan for preventive services as well as general preventive health recommendations were provided to patient.     Wanda PlumpJill A , RN  11/01/2018    Medical screening examination/treatment/procedure(s) were performed by non-physician practitioner and as supervising physician I was immediately available for consultation/collaboration. I agree with above. Pincus SanesStacy J Burns, MD

## 2018-11-01 ENCOUNTER — Ambulatory Visit (INDEPENDENT_AMBULATORY_CARE_PROVIDER_SITE_OTHER): Payer: Medicare Other | Admitting: *Deleted

## 2018-11-01 ENCOUNTER — Encounter: Payer: Self-pay | Admitting: Internal Medicine

## 2018-11-01 ENCOUNTER — Other Ambulatory Visit: Payer: Self-pay

## 2018-11-01 ENCOUNTER — Ambulatory Visit: Payer: Medicare Other

## 2018-11-01 ENCOUNTER — Ambulatory Visit (INDEPENDENT_AMBULATORY_CARE_PROVIDER_SITE_OTHER): Payer: Medicare Other | Admitting: Internal Medicine

## 2018-11-01 ENCOUNTER — Other Ambulatory Visit (INDEPENDENT_AMBULATORY_CARE_PROVIDER_SITE_OTHER): Payer: Medicare Other

## 2018-11-01 VITALS — BP 146/84 | HR 77 | Temp 98.0°F | Resp 18 | Ht 64.0 in | Wt 97.0 lb

## 2018-11-01 VITALS — BP 152/88 | HR 82 | Temp 98.0°F | Ht 64.0 in | Wt 97.0 lb

## 2018-11-01 DIAGNOSIS — R7303 Prediabetes: Secondary | ICD-10-CM | POA: Diagnosis not present

## 2018-11-01 DIAGNOSIS — Z720 Tobacco use: Secondary | ICD-10-CM

## 2018-11-01 DIAGNOSIS — Z780 Asymptomatic menopausal state: Secondary | ICD-10-CM | POA: Diagnosis not present

## 2018-11-01 DIAGNOSIS — M810 Age-related osteoporosis without current pathological fracture: Secondary | ICD-10-CM

## 2018-11-01 DIAGNOSIS — I10 Essential (primary) hypertension: Secondary | ICD-10-CM

## 2018-11-01 DIAGNOSIS — F411 Generalized anxiety disorder: Secondary | ICD-10-CM

## 2018-11-01 DIAGNOSIS — K219 Gastro-esophageal reflux disease without esophagitis: Secondary | ICD-10-CM | POA: Diagnosis not present

## 2018-11-01 DIAGNOSIS — E785 Hyperlipidemia, unspecified: Secondary | ICD-10-CM

## 2018-11-01 LAB — CBC WITH DIFFERENTIAL/PLATELET
Basophils Absolute: 0.1 10*3/uL (ref 0.0–0.1)
Basophils Relative: 1.4 % (ref 0.0–3.0)
Eosinophils Absolute: 0.1 10*3/uL (ref 0.0–0.7)
Eosinophils Relative: 1 % (ref 0.0–5.0)
HCT: 35.8 % — ABNORMAL LOW (ref 36.0–46.0)
Hemoglobin: 12.1 g/dL (ref 12.0–15.0)
Lymphocytes Relative: 37 % (ref 12.0–46.0)
Lymphs Abs: 2.8 10*3/uL (ref 0.7–4.0)
MCHC: 33.8 g/dL (ref 30.0–36.0)
MCV: 84.8 fl (ref 78.0–100.0)
Monocytes Absolute: 0.7 10*3/uL (ref 0.1–1.0)
Monocytes Relative: 8.9 % (ref 3.0–12.0)
Neutro Abs: 3.9 10*3/uL (ref 1.4–7.7)
Neutrophils Relative %: 51.7 % (ref 43.0–77.0)
Platelets: 284 10*3/uL (ref 150.0–400.0)
RBC: 4.22 Mil/uL (ref 3.87–5.11)
RDW: 14.7 % (ref 11.5–15.5)
WBC: 7.5 10*3/uL (ref 4.0–10.5)

## 2018-11-01 LAB — COMPREHENSIVE METABOLIC PANEL
ALT: 10 U/L (ref 0–35)
AST: 19 U/L (ref 0–37)
Albumin: 4.3 g/dL (ref 3.5–5.2)
Alkaline Phosphatase: 48 U/L (ref 39–117)
BUN: 18 mg/dL (ref 6–23)
CO2: 25 mEq/L (ref 19–32)
Calcium: 9.5 mg/dL (ref 8.4–10.5)
Chloride: 99 mEq/L (ref 96–112)
Creatinine, Ser: 0.83 mg/dL (ref 0.40–1.20)
GFR: 66.11 mL/min (ref >60.00)
Glucose, Bld: 98 mg/dL (ref 70–99)
Potassium: 4.7 mEq/L (ref 3.5–5.1)
Sodium: 134 mEq/L — ABNORMAL LOW (ref 135–145)
Total Bilirubin: 0.4 mg/dL (ref 0.2–1.2)
Total Protein: 8.4 g/dL — ABNORMAL HIGH (ref 6.0–8.3)

## 2018-11-01 LAB — VITAMIN D 25 HYDROXY (VIT D DEFICIENCY, FRACTURES): VITD: 29 ng/mL — ABNORMAL LOW (ref 30.00–100.00)

## 2018-11-01 LAB — TSH: TSH: 3.63 u[IU]/mL (ref 0.35–4.50)

## 2018-11-01 LAB — LIPID PANEL
Cholesterol: 200 mg/dL (ref 0–200)
HDL: 70.8 mg/dL (ref >39.00)
LDL Cholesterol: 111 mg/dL — ABNORMAL HIGH (ref 0–99)
NonHDL: 129.44
Total CHOL/HDL Ratio: 3
Triglycerides: 92 mg/dL (ref 0.0–149.0)
VLDL: 18.4 mg/dL (ref 0.0–40.0)

## 2018-11-01 LAB — HEMOGLOBIN A1C: Hgb A1c MFr Bld: 6 % (ref 4.6–6.5)

## 2018-11-01 NOTE — Assessment & Plan Note (Signed)
DEXA up-to-date Getting Prolia every 6 months Continue calcium and vitamin D daily Walks the dog-encouraged continuing regular walking Smoking cessation stressed

## 2018-11-01 NOTE — Assessment & Plan Note (Signed)
GERD controlled Continue daily medication  

## 2018-11-01 NOTE — Assessment & Plan Note (Signed)
Stop paxil in the fall of 2019 and has been fine Will monitor off medication

## 2018-11-01 NOTE — Assessment & Plan Note (Signed)
Slightly elevated but consistent BP controlled Current regimen effective and well tolerated Continue current medications at current doses cmp

## 2018-11-01 NOTE — Assessment & Plan Note (Signed)
Check lipid panel  Continue daily statin Regular exercise and healthy diet encouraged  

## 2018-11-01 NOTE — Patient Instructions (Addendum)
Bring a copy of your living will and/or healthcare power of attorney to your next office visit.  Continue to eat heart healthy diet (full of fruits, vegetables, whole grains, lean protein, water--limit salt, fat, and sugar intake) and increase physical activity as tolerated.  Continue doing brain stimulating activities (puzzles, reading, adult coloring books, staying active) to keep memory sharp.    Jennifer [name] , Thank you for taking time to come for your Medicare Wellness Visit. I appreciate your ongoing commitment to your health goals. Please review the following plan we discussed and let me know if I can assist you in the future.   These are the goals we discussed: Goals    . Patient Stated     Continue to be physically and socially active. Quit smoking by slowly decreasing my cigarettes over a period of time.       This is a list of the screening recommended for you and due dates:  Health Maintenance  Topic Date Due  . DEXA scan (bone density measurement)  05/25/2018  . Flu Shot  11/23/2018  . Tetanus Vaccine  07/22/2023  . Pneumonia vaccines  Completed     Preventive Care 45 Years and Older, Female Preventive care refers to lifestyle choices and visits with your health care provider that can promote health and wellness. This includes:  A yearly physical exam. This is also called an annual well check.  Regular dental and eye exams.  Immunizations.  Screening for certain conditions.  Healthy lifestyle choices, such as diet and exercise. What can I expect for my preventive care visit? Physical exam Your health care provider will check:  Height and weight. These may be used to calculate body mass index (BMI), which is a measurement that tells if you are at a healthy weight.  Heart rate and blood pressure.  Your skin for abnormal spots. Counseling Your health care provider may ask you questions about:  Alcohol, tobacco, and drug use.  Emotional  well-being.  Home and relationship well-being.  Sexual activity.  Eating habits.  History of falls.  Memory and ability to understand (cognition).  Work and work Statistician.  Pregnancy and menstrual history. What immunizations do I need?  Influenza (flu) vaccine  This is recommended every year. Tetanus, diphtheria, and pertussis (Tdap) vaccine  You may need a Td booster every 10 years. Varicella (chickenpox) vaccine  You may need this vaccine if you have not already been vaccinated. Zoster (shingles) vaccine  You may need this after age 8. Pneumococcal conjugate (PCV13) vaccine  One dose is recommended after age 51. Pneumococcal polysaccharide (PPSV23) vaccine  One dose is recommended after age 41. Measles, mumps, and rubella (MMR) vaccine  You may need at least one dose of MMR if you were born in 1957 or later. You may also need a second dose. Meningococcal conjugate (MenACWY) vaccine  You may need this if you have certain conditions. Hepatitis A vaccine  You may need this if you have certain conditions or if you travel or work in places where you may be exposed to hepatitis A. Hepatitis B vaccine  You may need this if you have certain conditions or if you travel or work in places where you may be exposed to hepatitis B. Haemophilus influenzae type b (Hib) vaccine  You may need this if you have certain conditions. You may receive vaccines as individual doses or as more than one vaccine together in one shot (combination vaccines). Talk with your health care provider about  the risks and benefits of combination vaccines. What tests do I need? Blood tests  Lipid and cholesterol levels. These may be checked every 5 years, or more frequently depending on your overall health.  Hepatitis C test.  Hepatitis B test. Screening  Lung cancer screening. You may have this screening every year starting at age 84 if you have a 30-pack-year history of smoking and  currently smoke or have quit within the past 15 years.  Colorectal cancer screening. All adults should have this screening starting at age 10 and continuing until age 14. Your health care provider may recommend screening at age 2 if you are at increased risk. You will have tests every 1-10 years, depending on your results and the type of screening test.  Diabetes screening. This is done by checking your blood sugar (glucose) after you have not eaten for a while (fasting). You may have this done every 1-3 years.  Mammogram. This may be done every 1-2 years. Talk with your health care provider about how often you should have regular mammograms.  BRCA-related cancer screening. This may be done if you have a family history of breast, ovarian, tubal, or peritoneal cancers. Other tests  Sexually transmitted disease (STD) testing.  Bone density scan. This is done to screen for osteoporosis. You may have this done starting at age 7. Follow these instructions at home: Eating and drinking  Eat a diet that includes fresh fruits and vegetables, whole grains, lean protein, and low-fat dairy products. Limit your intake of foods with high amounts of sugar, saturated fats, and salt.  Take vitamin and mineral supplements as recommended by your health care provider.  Do not drink alcohol if your health care provider tells you not to drink.  If you drink alcohol: ? Limit how much you have to 0-1 drink a day. ? Be aware of how much alcohol is in your drink. In the [place] one drink equals one 12 oz bottle of beer (355 mL), one 5 oz glass of Jaylan Duggar (148 mL), or one 1 oz glass of hard liquor (44 mL). Lifestyle  Take daily care of your teeth and gums.  Stay active. Exercise for at least 30 minutes on 5 or more days each week.  Do not use any products that contain nicotine or tobacco, such as cigarettes, e-cigarettes, and chewing tobacco. If you need help quitting, ask your health care provider.  If you are  sexually active, practice safe sex. Use a condom or other form of protection in order to prevent STIs (sexually transmitted infections).  Talk with your health care provider about taking a low-dose aspirin or statin. What's next?  Go to your health care provider once a year for a well check visit.  Ask your health care provider how often you should have your eyes and teeth checked.  Stay up to date on all vaccines. This information is not intended to replace advice given to you by your health care provider. Make sure you discuss any questions you have with your health care provider. Document Released: 05/07/2015 Document Revised: 04/04/2018 Document Reviewed: 04/04/2018 Elsevier Patient Education  2020 [employer].

## 2018-11-01 NOTE — Assessment & Plan Note (Signed)
She will plan on slowly tapering herself off the cigarettes She does want to quit and knows how important it is

## 2018-11-01 NOTE — Assessment & Plan Note (Signed)
Check a1c Low sugar / carb diet Stressed regular exercise   

## 2018-11-03 ENCOUNTER — Encounter: Payer: Self-pay | Admitting: Internal Medicine

## 2018-11-04 ENCOUNTER — Other Ambulatory Visit: Payer: Self-pay | Admitting: Internal Medicine

## 2018-11-05 ENCOUNTER — Ambulatory Visit (INDEPENDENT_AMBULATORY_CARE_PROVIDER_SITE_OTHER)
Admission: RE | Admit: 2018-11-05 | Discharge: 2018-11-05 | Disposition: A | Discharge status: Home / Self Care | Payer: Medicare Other | Source: Ambulatory Visit

## 2018-11-05 ENCOUNTER — Other Ambulatory Visit: Payer: Self-pay

## 2018-11-05 DIAGNOSIS — Z780 Asymptomatic menopausal state: Secondary | ICD-10-CM

## 2018-11-07 ENCOUNTER — Encounter: Payer: Self-pay | Admitting: Internal Medicine

## 2018-11-11 ENCOUNTER — Other Ambulatory Visit: Payer: Self-pay | Admitting: Internal Medicine

## 2018-12-03 ENCOUNTER — Other Ambulatory Visit: Payer: Self-pay | Admitting: Internal Medicine

## 2018-12-12 ENCOUNTER — Other Ambulatory Visit: Payer: Self-pay | Admitting: Internal Medicine

## 2018-12-19 ENCOUNTER — Other Ambulatory Visit: Payer: Self-pay | Admitting: Internal Medicine

## 2019-01-27 ENCOUNTER — Ambulatory Visit: Payer: Medicare Other | Admitting: Cardiovascular Disease

## 2019-01-27 ENCOUNTER — Encounter: Payer: Self-pay | Admitting: Cardiovascular Disease

## 2019-01-27 ENCOUNTER — Other Ambulatory Visit: Payer: Self-pay

## 2019-01-27 VITALS — BP 136/70 | HR 92 | Ht 64.0 in | Wt 100.4 lb

## 2019-01-27 DIAGNOSIS — I351 Nonrheumatic aortic (valve) insufficiency: Secondary | ICD-10-CM | POA: Diagnosis not present

## 2019-01-27 DIAGNOSIS — I6523 Occlusion and stenosis of bilateral carotid arteries: Secondary | ICD-10-CM | POA: Diagnosis not present

## 2019-01-27 DIAGNOSIS — I739 Peripheral vascular disease, unspecified: Secondary | ICD-10-CM

## 2019-01-27 NOTE — Progress Notes (Signed)
Chief Complaint  Patient presents with  . Follow-up    PAD    History of Present Illness: 78 yo female with history of PAD, tobacco abuse, HTN, aortic valve insufficiency and hyperlipidemia who is here today for PV follow up. I saw her as a new pt in November 2011 for evaluation of PAD. Initial non-invasive studies showed chronic total occlusion of the left SFA with a long segment of total occlusion, reconstitution above the knee. There is moderate disease in the right SFA. Her initial ABI in 2012 was moderately reduced at 0.76 on the right and 0.60 on the left. I recommended that we pursue a diagnostic angiogram if she wished but she refused. Her PAD has been stable over the past 9 years. Most recent non-invasive studies June 2020 with right ABI 0.84 and left ABI 0.69. Carotid artery dopplers June 5176 with 16-07% LICA, 3-71% RICA stenosis, unchanged. She has been on ASA and Pletal. Echo September 2018 with normal LV systolic function, GGYI=94-85%. Grade 1 diastolic dysfunction. Mild AI.   She is here today for follow up. The patient denies any chest pain, dyspnea, palpitations, lower extremity edema, orthopnea, PND, dizziness, near syncope or syncope. She is walking her dog every day and is having no pain in her legs. No rest pain or ulcerations.   Primary Care Physician: Binnie Rail, MD  Past Medical History:  Diagnosis Date  . ANEMIA-NOS   . Cataract    right cataract removed  . Clotting disorder (HCC)    Pletal  . Depression    no longer taking meds/ resolved  . DYSLIPIDEMIA   . GERD (gastroesophageal reflux disease)   . Hemorrhoids   . HYPERTENSION   . Negative colorectal cancer screening using Cologuard test   . Osteoporosis, senile    DEXA 03/2014: -3.3 R fem  . Peripheral vascular disease, unspecified ([place]) dx 02/2010 ABI  . SMOKER     Past Surgical History:  Procedure Laterality Date  . CATARACT EXTRACTION W/ INTRAOCULAR LENS IMPLANT Right 03/12/14   groat  .  HEMORRHOID SURGERY  05/18/2011   Procedure: HEMORRHOIDECTOMY;  Surgeon: Merrie Roof, MD;  Location: [facility];  Service: General;  Laterality: N/A;  hemorrhoidectomy, three columns, internal and external  . TONSILLECTOMY  1946  . TUBAL LIGATION  1979  . UPPER GASTROINTESTINAL ENDOSCOPY  01/03/2018    Current Outpatient Medications  Medication Sig Dispense Refill  . ALPRAZolam (XANAX) 0.25 MG tablet TAKE 1 TABLET BY MOUTH EVERY NIGHT AT BEDTIME AS NEEDED FOR ANXIETY OR SLEEP 30 tablet 4  . amLODipine (NORVASC) 5 MG tablet TAKE 1 TABLET BY MOUTH EVERY NIGHT AT BEDTIME 90 tablet 1  . aspirin 81 MG tablet Take 81 mg by mouth daily before breakfast.     . atorvastatin (LIPITOR) 20 MG tablet TAKE 1 TABLET BY MOUTH EVERY DAY 90 tablet 1  . calcium-vitamin D (OSCAL WITH D) 500-200 MG-UNIT per tablet Take 2 tablets by mouth daily.    . cilostazol (PLETAL) 50 MG tablet TAKE 1 TABLET(50 MG) BY MOUTH TWICE DAILY 180 tablet 1  . omeprazole (PRILOSEC) 40 MG capsule TAKE 1 CAPSULE(40 MG) BY MOUTH DAILY 90 capsule 0  . potassium chloride SA (K-DUR) 20 MEQ tablet TAKE 1 TABLET(20 MEQ) BY MOUTH DAILY 90 tablet 3   No current facility-administered medications for this visit.     No Known Allergies  Social History   Socioeconomic History  . Marital status: Single    Spouse name:  Not on file  . Number of children: Not on file  . Years of education: Not on file  . Highest education level: Not on file  Occupational History  . Occupation: retired  Engineer, productionocial Needs  . Financial resource strain: Not hard at all  . Food insecurity    Worry: Never true    Inability: Never true  . Transportation needs    Medical: No    Non-medical: No  Tobacco Use  . Smoking status: Current Every Day Smoker    Packs/day: 0.50    Years: 62.00    Pack years: 31.00    Types: Cigarettes  . Smokeless tobacco: Never Used  Substance and Sexual Activity  . Alcohol use: Yes    Alcohol/week: 0.0 standard drinks    Comment:  Social- 1 scotch, 1 glass wine weekly  . Drug use: No  . Sexual activity: Not Currently    Birth control/protection: Post-menopausal  Lifestyle  . Physical activity    Days per week: 5 days    Minutes per session: 30 min  . Stress: Not at all  Relationships  . Social connections    Talks on phone: More than three times a week    Gets together: More than three times a week    Attends religious service: 1 to 4 times per year    Active member of club or organization: Yes    Attends meetings of clubs or organizations: More than 4 times per year    Relationship status: Not on file  . Intimate partner violence    Fear of current or ex partner: Not on file    Emotionally abused: Not on file    Physically abused: Not on file    Forced sexual activity: Not on file  Other Topics Concern  . Not on file  Social History Narrative   Single, lives alone. 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    Family History  Problem Relation Age of Onset  . Breast cancer Mother   . Lung cancer Father   . Hypertension Brother   . Colon cancer Neg Hx   . Esophageal cancer Neg Hx   . Stomach cancer Neg Hx   . Rectal cancer Neg Hx     Review of Systems:  As stated in the HPI and otherwise negative.   BP 136/70   Pulse 92   Ht 5\' 4"  (1.626 m)   Wt 100 lb 6.4 oz (45.5 kg)   SpO2 97%   BMI 17.23 kg/m   Physical Examination:  General: Well developed, well nourished, NAD  HEENT: OP clear, mucus membranes moist  SKIN: warm, dry. No rashes. Neuro: No focal deficits  Musculoskeletal: Muscle strength 5/5 all ext  Psychiatric: Mood and affect normal  Neck: No JVD, no carotid bruits, no thyromegaly, no lymphadenopathy.  Lungs:Clear bilaterally, no wheezes, rhonci, crackles Cardiovascular: Regular rate and rhythm. No murmurs, gallops or rubs. Abdomen:Soft. Bowel sounds present. Non-tender.  Extremities: No lower extremity edema.  Pulses are trace in the bilateral DP/PT.  Echo September  2018: Left ventricle: There was mild focal basal hypertrophy of the   septum. Systolic function was normal. The estimated ejection   fraction was in the range of 55% to 60%. Doppler parameters are   consistent with abnormal left ventricular relaxation (grade 1   diastolic dysfunction). - Aortic valve: There was mild regurgitation.   EKG:  EKG is ordered today. The ekg ordered today demonstrates Sinus rhythm. Marked sinus arrhythmia. LVH.  Recent Labs: 11/01/2018: ALT 10; BUN 18; Creatinine, Ser 0.83; Hemoglobin 12.1; Platelets 284.0; Potassium 4.7; Sodium 134; TSH 3.63   Lipid Panel    Component Value Date/Time   CHOL 200 11/01/2018 1555   TRIG 92.0 11/01/2018 1555   HDL 70.80 11/01/2018 1555   CHOLHDL 3 11/01/2018 1555   VLDL 18.4 11/01/2018 1555   LDLCALC 111 (H) 11/01/2018 1555   LDLDIRECT 232.1 08/09/2009 1125     Wt Readings from Last 3 Encounters:  01/27/19 100 lb 6.4 oz (45.5 kg)  11/01/18 97 lb (44 kg)  11/01/18 97 lb (44 kg)     Other studies Reviewed: Additional studies/ records that were reviewed today include: . Review of the above records demonstrates:    Assessment and Plan:   1. PAD:  ABI overall stable by dopplers June 2020. No change in her stable claudication symptoms. Continue to follow conservatively unless she has a change in her symptoms. Continue ASA and Pletal. Repeat dopplers June 2021.     2. Carotid artery disease: Stable moderate LICA stenosis and mild RICA stenosis by dopplers June 2020. Will repeat in June 2021.   3. Tobacco abuse: Smoking cessation is advised.   4. Aortic valve insufficiency: Mild AI by echo September 2018.  Repeat echo September 2021.   Current medicines are reviewed at length with the patient today.  The patient does not have concerns regarding medicines.  The following changes have been made:  no change  Labs/ tests ordered today include:   No orders of the defined types were placed in this encounter.    Disposition:   FU with me in 12  months  Signed, Verne Carrow, MD 01/27/2019 4:18 PM    [facility] [address] [place], [place], [place]  [address] Phone: [phone]; Fax: [phone]

## 2019-01-27 NOTE — Patient Instructions (Signed)
Medication Instructions:  No changes If you need a refill on your cardiac medications before your next appointment, please call your pharmacy.   Lab work: none If you have labs (blood work) drawn today and your tests are completely normal, you will receive your results only by: Marland Kitchen MyChart Message (if you have MyChart) OR . A paper copy in the mail If you have any lab test that is abnormal or we need to change your treatment, we will call you to review the results.  Testing/Procedures: Your physician has requested that you have a carotid duplex. This test is an ultrasound of the carotid arteries in your neck. It looks at blood flow through these arteries that supply the brain with blood. Allow one hour for this exam. There are no restrictions or special instructions. Due June 2021  Your physician has requested that you have an ankle brachial index (ABI). During this test an ultrasound and blood pressure cuff are used to evaluate the arteries that supply the arms and legs with blood. Allow thirty minutes for this exam. There are no restrictions or special instructions.  Due June 2021   Follow-Up: At [facility], you and your health needs are our priority.  As part of our continuing mission to provide you with exceptional heart care, we have created designated Provider Care Teams.  These Care Teams include your primary Cardiologist (physician) and Advanced Practice Providers (APPs -  Physician Assistants and Nurse Practitioners) who all work together to provide you with the care you need, when you need it. You will need a follow up appointment in 12 months.  Please call our office 2 months in advance to schedule this appointment.  You may see Lauree Chandler, MD or one of the following Advanced Practice Providers on your designated Care Team:   [place], PA-C Melina Copa, PA-C . Ermalinda Barrios, PA-C  Any Other Special Instructions Will Be Listed Below (If Applicable).

## 2019-02-05 ENCOUNTER — Other Ambulatory Visit: Payer: Self-pay | Admitting: Internal Medicine

## 2019-02-05 NOTE — Telephone Encounter (Signed)
Last OV 11/01/18 Next OV 05/07/19 Last RF 12/11/18

## 2019-02-17 ENCOUNTER — Telehealth: Payer: Self-pay | Admitting: Internal Medicine

## 2019-02-17 NOTE — Telephone Encounter (Signed)
Patient has been submitted for insurance approval for Prolia injection through the Prolia Portal.  °Waiting on benefits. °Will contact patient once these have been received. °

## 2019-05-05 ENCOUNTER — Other Ambulatory Visit: Payer: Self-pay | Admitting: Internal Medicine

## 2019-05-07 ENCOUNTER — Other Ambulatory Visit: Payer: Self-pay

## 2019-05-07 ENCOUNTER — Encounter: Payer: Self-pay | Admitting: Internal Medicine

## 2019-05-07 ENCOUNTER — Ambulatory Visit (INDEPENDENT_AMBULATORY_CARE_PROVIDER_SITE_OTHER): Payer: Medicare Other | Admitting: Internal Medicine

## 2019-05-07 VITALS — BP 144/82 | HR 92 | Temp 97.9°F | Resp 16 | Ht 64.0 in | Wt 99.6 lb

## 2019-05-07 DIAGNOSIS — Z720 Tobacco use: Secondary | ICD-10-CM

## 2019-05-07 DIAGNOSIS — K219 Gastro-esophageal reflux disease without esophagitis: Secondary | ICD-10-CM

## 2019-05-07 DIAGNOSIS — F411 Generalized anxiety disorder: Secondary | ICD-10-CM

## 2019-05-07 DIAGNOSIS — M810 Age-related osteoporosis without current pathological fracture: Secondary | ICD-10-CM

## 2019-05-07 DIAGNOSIS — I10 Essential (primary) hypertension: Secondary | ICD-10-CM

## 2019-05-07 DIAGNOSIS — E785 Hyperlipidemia, unspecified: Secondary | ICD-10-CM | POA: Diagnosis not present

## 2019-05-07 DIAGNOSIS — R7303 Prediabetes: Secondary | ICD-10-CM | POA: Diagnosis not present

## 2019-05-07 LAB — COMPREHENSIVE METABOLIC PANEL
ALT: 8 U/L (ref 0–35)
AST: 20 U/L (ref 0–37)
Albumin: 3.9 g/dL (ref 3.5–5.2)
Alkaline Phosphatase: 52 U/L (ref 39–117)
BUN: 21 mg/dL (ref 6–23)
CO2: 24 mEq/L (ref 19–32)
Calcium: 9.6 mg/dL (ref 8.4–10.5)
Chloride: 96 mEq/L (ref 96–112)
Creatinine, Ser: 1 mg/dL (ref 0.40–1.20)
GFR: 53.25 mL/min — ABNORMAL LOW (ref >60.00)
Glucose, Bld: 91 mg/dL (ref 70–99)
Potassium: 3.8 mEq/L (ref 3.5–5.1)
Sodium: 129 mEq/L — ABNORMAL LOW (ref 135–145)
Total Bilirubin: 0.4 mg/dL (ref 0.2–1.2)
Total Protein: 7.7 g/dL (ref 6.0–8.3)

## 2019-05-07 LAB — LIPID PANEL
Cholesterol: 189 mg/dL (ref 0–200)
HDL: 69.3 mg/dL (ref >39.00)
LDL Cholesterol: 104 mg/dL — ABNORMAL HIGH (ref 0–99)
NonHDL: 119.58
Total CHOL/HDL Ratio: 3
Triglycerides: 78 mg/dL (ref 0.0–149.0)
VLDL: 15.6 mg/dL (ref 0.0–40.0)

## 2019-05-07 NOTE — Assessment & Plan Note (Signed)
Still smoking She has no desire to quit, but understands that she should quit

## 2019-05-07 NOTE — Assessment & Plan Note (Signed)
Chronic Check a1c Low sugar / carb diet Stressed regular exercise  

## 2019-05-07 NOTE — Assessment & Plan Note (Addendum)
Chronic Controlled, stable Continue Xanax at night as needed

## 2019-05-07 NOTE — Progress Notes (Signed)
Subjective:    Patient ID: Jennifer [name], female    DOB: [birth year], 79 y.o.   MRN: [id number]  HPI The patient is here for follow up of their chronic medical problems, including hypertension, hyperlipidemia, prediabetes, OP, anxiety, gerd, smoking.  She is taking all of her medications as prescribed.   She is exercising regularly - walks dog.    She has been less compliant with a low sugar diet.  She has been happy living alone by herself.  She is doing lots of reading and has not felt depressed or anxious regarding being at home more.   Medications and allergies reviewed with patient and updated if appropriate.  Patient Active Problem List   Diagnosis Date Noted  . Hiatal hernia, large 01/09/2018  . Dysphagia 10/31/2017  . GERD (gastroesophageal reflux disease) 12/08/2016  . History of Clostridium difficile infection 11/29/2016  . Prediabetes 11/02/2016  . Carotid arterial disease ([place]) 10/20/2015  . Anxiety state   . PAD (peripheral artery disease) ([place]) 10/10/2011  . Osteoporosis, senile 05/17/2011  . Dyslipidemia 05/03/2009  . Tobacco abuse 04/14/2009  . Essential hypertension 04/14/2009    Current Outpatient Medications on File Prior to Visit  Medication Sig Dispense Refill  . ALPRAZolam (XANAX) 0.25 MG tablet TAKE 1 TABLET BY MOUTH EVERY NIGHT AT BEDTIME AS NEEDED FOR ANXIETY OR SLEEP 30 tablet 2  . amLODipine (NORVASC) 5 MG tablet TAKE 1 TABLET BY MOUTH EVERY NIGHT AT BEDTIME 90 tablet 1  . aspirin 81 MG tablet Take 81 mg by mouth daily before breakfast.     . atorvastatin (LIPITOR) 20 MG tablet TAKE 1 TABLET BY MOUTH EVERY DAY 90 tablet 1  . calcium-vitamin D (OSCAL WITH D) 500-200 MG-UNIT per tablet Take 2 tablets by mouth daily.    . cilostazol (PLETAL) 50 MG tablet TAKE 1 TABLET(50 MG) BY MOUTH TWICE DAILY 180 tablet 1  . omeprazole (PRILOSEC) 40 MG capsule TAKE 1 CAPSULE(40 MG) BY MOUTH DAILY 90 capsule 0  . potassium chloride SA (K-DUR) 20 MEQ tablet  TAKE 1 TABLET(20 MEQ) BY MOUTH DAILY 90 tablet 3   No current facility-administered medications on file prior to visit.    Past Medical History:  Diagnosis Date  . ANEMIA-NOS   . Cataract    right cataract removed  . Clotting disorder (HCC)    Pletal  . Depression    no longer taking meds/ resolved  . DYSLIPIDEMIA   . GERD (gastroesophageal reflux disease)   . Hemorrhoids   . HYPERTENSION   . Negative colorectal cancer screening using Cologuard test   . Osteoporosis, senile    DEXA 03/2014: -3.3 R fem  . Peripheral vascular disease, unspecified ([place]) dx 02/2010 ABI  . SMOKER     Past Surgical History:  Procedure Laterality Date  . CATARACT EXTRACTION W/ INTRAOCULAR LENS IMPLANT Right 03/12/14   groat  . HEMORRHOID SURGERY  05/18/2011   Procedure: HEMORRHOIDECTOMY;  Surgeon: Merrie Roof, MD;  Location: [facility];  Service: General;  Laterality: N/A;  hemorrhoidectomy, three columns, internal and external  . TONSILLECTOMY  1946  . TUBAL LIGATION  1979  . UPPER GASTROINTESTINAL ENDOSCOPY  01/03/2018    Social History   Socioeconomic History  . Marital status: Single    Spouse name: Not on file  . Number of children: Not on file  . Years of education: Not on file  . Highest education level: Not on file  Occupational History  . Occupation: retired  Tobacco Use  . Smoking status: Current Every Day Smoker    Packs/day: 0.50    Years: 62.00    Pack years: 31.00    Types: Cigarettes  . Smokeless tobacco: Never Used  Substance and Sexual Activity  . Alcohol use: Yes    Alcohol/week: 0.0 standard drinks    Comment: Social- 1 scotch, 1 glass wine weekly  . Drug use: No  . Sexual activity: Not Currently    Birth control/protection: Post-menopausal  Other Topics Concern  . Not on file  Social History Narrative   Single, lives alone. Retired Pensions consultant. Enjoys Agricultural consultant work.   Social Determinants of Health   Financial Resource Strain: Low Risk   .  Difficulty of Paying Living Expenses: Not hard at all  Food Insecurity: No Food Insecurity  . Worried About Programme researcher, broadcasting/film/video in the Last Year: Never true  . Ran Out of Food in the Last Year: Never true  Transportation Needs: No Transportation Needs  . Lack of Transportation (Medical): No  . Lack of Transportation (Non-Medical): No  Physical Activity: Sufficiently Active  . Days of Exercise per Week: 5 days  . Minutes of Exercise per Session: 30 min  Stress: No Stress Concern Present  . Feeling of Stress : Not at all  Social Connections: Unknown  . Frequency of Communication with Friends and Family: More than three times a week  . Frequency of Social Gatherings with Friends and Family: More than three times a week  . Attends Religious Services: 1 to 4 times per year  . Active Member of Clubs or Organizations: Yes  . Attends Banker Meetings: More than 4 times per year  . Marital Status: Not on file    Family History  Problem Relation Age of Onset  . Breast cancer Mother   . Lung cancer Father   . Hypertension Brother   . Colon cancer Neg Hx   . Esophageal cancer Neg Hx   . Stomach cancer Neg Hx   . Rectal cancer Neg Hx     Review of Systems  Constitutional: Negative for chills and fever.  Respiratory: Negative for cough, shortness of breath and wheezing.   Cardiovascular: Negative for chest pain, palpitations and leg swelling.  Musculoskeletal: Positive for back pain (occasionally).  Neurological: Negative for dizziness, light-headedness and headaches.       Objective:   Vitals:   05/07/19 1358  BP: (!) 144/82  Pulse: 92  Resp: 16  Temp: 97.9 F (36.6 C)  SpO2: 99%   BP Readings from Last 3 Encounters:  05/07/19 (!) 144/82  01/27/19 136/70  11/01/18 (!) 152/88   Wt Readings from Last 3 Encounters:  05/07/19 99 lb 9.6 oz (45.2 kg)  01/27/19 100 lb 6.4 oz (45.5 kg)  11/01/18 97 lb (44 kg)   Body mass index is 17.1 kg/m.   Physical Exam      Constitutional: Appears well-developed and well-nourished. No distress.  HENT:  Head: Normocephalic and atraumatic.  Neck: Neck supple. No tracheal deviation present. No thyromegaly present.  No cervical lymphadenopathy Cardiovascular: Normal rate, regular rhythm and normal heart sounds.   No murmur heard. No carotid bruit .  No edema Pulmonary/Chest: Effort normal and breath sounds normal. No respiratory distress. No has no wheezes. No rales.  Skin: Skin is warm and dry. Not diaphoretic.  Psychiatric: Normal mood and affect. Behavior is normal.      Assessment & Plan:    See Problem List for  Assessment and Plan of chronic medical problems.    This visit occurred during the SARS-CoV-2 public health emergency.  Safety protocols were in place, including screening questions prior to the visit, additional usage of staff PPE, and extensive cleaning of exam room while observing appropriate contact time as indicated for disinfecting solutions.

## 2019-05-07 NOTE — Assessment & Plan Note (Addendum)
Walking her dog for exercise Taking calcium and vitamin D DEXA up-to-date On Prolia-continue every 6 months Smoking cessation encouraged, but at this time she has no desire to quit

## 2019-05-07 NOTE — Assessment & Plan Note (Signed)
Chronic GERD controlled Continue daily medication  

## 2019-05-07 NOTE — Patient Instructions (Addendum)
°  Blood work was ordered.   ° ° °Medications reviewed and updated.  Changes include :   none ° ° ° °Please followup in 6 months ° ° °

## 2019-05-07 NOTE — Assessment & Plan Note (Signed)
Chronic Check lipid panel  Continue daily statin Regular exercise and healthy diet encouraged  

## 2019-05-07 NOTE — Assessment & Plan Note (Signed)
Chronic BP well controlled Current regimen effective and well tolerated Continue current medications at current doses cmp  

## 2019-05-08 ENCOUNTER — Encounter: Payer: Self-pay | Admitting: Internal Medicine

## 2019-05-08 LAB — VITAMIN D 25 HYDROXY (VIT D DEFICIENCY, FRACTURES): VITD: 32.97 ng/mL (ref 30.00–100.00)

## 2019-05-08 LAB — HEMOGLOBIN A1C: Hgb A1c MFr Bld: 6 % (ref 4.6–6.5)

## 2019-05-22 ENCOUNTER — Ambulatory Visit: Payer: Medicare Other

## 2019-05-30 ENCOUNTER — Ambulatory Visit: Payer: Medicare Other | Attending: Internal Medicine

## 2019-05-30 DIAGNOSIS — Z23 Encounter for immunization: Secondary | ICD-10-CM

## 2019-05-30 NOTE — Progress Notes (Signed)
   Covid-19 Vaccination Clinic  Name:  OCEANNA ARRUDA    MRN: [id number] DOB: October 13, 1940  05/30/2019  Ms. Charbonneau was observed post Covid-19 immunization for 15 minutes without incidence. She was provided with Vaccine Information Sheet and instruction to access the V-Safe system.   Ms. Eddleman was instructed to call 911 with any severe reactions post vaccine: Marland Kitchen Difficulty breathing  . Swelling of your face and throat  . A fast heartbeat  . A bad rash all over your body  . Dizziness and weakness    Immunizations Administered    Name Date Dose VIS Date Route   Pfizer COVID-19 Vaccine 05/30/2019  5:31 PM 0.3 mL 04/04/2019 Intramuscular   Manufacturer: [employer], [employer]   Lot: [id number]   NDC: [address]-0

## 2019-06-02 ENCOUNTER — Ambulatory Visit: Payer: Medicare Other

## 2019-06-02 ENCOUNTER — Other Ambulatory Visit: Payer: Self-pay | Admitting: Internal Medicine

## 2019-06-03 ENCOUNTER — Encounter (HOSPITAL_COMMUNITY): Payer: Self-pay | Admitting: Family Medicine

## 2019-06-03 ENCOUNTER — Emergency Department (HOSPITAL_COMMUNITY)
Admission: EM | Admit: 2019-06-03 | Discharge: 2019-06-03 | Disposition: A | Discharge status: Home / Self Care | Payer: Medicare Other | Attending: Emergency Medicine | Admitting: Emergency Medicine

## 2019-06-03 ENCOUNTER — Emergency Department (HOSPITAL_COMMUNITY): Payer: Medicare Other

## 2019-06-03 ENCOUNTER — Other Ambulatory Visit: Payer: Self-pay

## 2019-06-03 DIAGNOSIS — R0789 Other chest pain: Secondary | ICD-10-CM

## 2019-06-03 DIAGNOSIS — I499 Cardiac arrhythmia, unspecified: Secondary | ICD-10-CM | POA: Diagnosis not present

## 2019-06-03 DIAGNOSIS — I10 Essential (primary) hypertension: Secondary | ICD-10-CM | POA: Diagnosis not present

## 2019-06-03 DIAGNOSIS — I714 Abdominal aortic aneurysm, without rupture, unspecified: Secondary | ICD-10-CM

## 2019-06-03 DIAGNOSIS — R0602 Shortness of breath: Secondary | ICD-10-CM | POA: Diagnosis not present

## 2019-06-03 DIAGNOSIS — Z79899 Other long term (current) drug therapy: Secondary | ICD-10-CM | POA: Insufficient documentation

## 2019-06-03 DIAGNOSIS — R0902 Hypoxemia: Secondary | ICD-10-CM | POA: Diagnosis not present

## 2019-06-03 DIAGNOSIS — F17210 Nicotine dependence, cigarettes, uncomplicated: Secondary | ICD-10-CM | POA: Diagnosis not present

## 2019-06-03 DIAGNOSIS — Z7982 Long term (current) use of aspirin: Secondary | ICD-10-CM | POA: Diagnosis not present

## 2019-06-03 DIAGNOSIS — Z743 Need for continuous supervision: Secondary | ICD-10-CM | POA: Diagnosis not present

## 2019-06-03 DIAGNOSIS — I712 Thoracic aortic aneurysm, without rupture: Secondary | ICD-10-CM | POA: Diagnosis not present

## 2019-06-03 DIAGNOSIS — R079 Chest pain, unspecified: Secondary | ICD-10-CM | POA: Diagnosis not present

## 2019-06-03 LAB — BASIC METABOLIC PANEL
Anion gap: 11 (ref 5–15)
BUN: 18 mg/dL (ref 8–23)
CO2: 24 mmol/L (ref 22–32)
Calcium: 10.1 mg/dL (ref 8.9–10.3)
Chloride: 97 mmol/L — ABNORMAL LOW (ref 98–111)
Creatinine, Ser: 0.99 mg/dL (ref 0.44–1.00)
GFR calc Af Amer: >60 mL/min (ref >60)
GFR calc non Af Amer: 54 mL/min — ABNORMAL LOW (ref >60)
Glucose, Bld: 102 mg/dL — ABNORMAL HIGH (ref 70–99)
Potassium: 4.3 mmol/L (ref 3.5–5.1)
Sodium: 132 mmol/L — ABNORMAL LOW (ref 135–145)

## 2019-06-03 LAB — CBC
HCT: 35.4 % — ABNORMAL LOW (ref 36.0–46.0)
Hemoglobin: 11.6 g/dL — ABNORMAL LOW (ref 12.0–15.0)
MCH: 27.5 pg (ref 26.0–34.0)
MCHC: 32.8 g/dL (ref 30.0–36.0)
MCV: 83.9 fL (ref 80.0–100.0)
Platelets: 268 10*3/uL (ref 150–400)
RBC: 4.22 MIL/uL (ref 3.87–5.11)
RDW: 14.9 % (ref 11.5–15.5)
WBC: 6.3 10*3/uL (ref 4.0–10.5)
nRBC: 0 % (ref 0.0–0.2)

## 2019-06-03 LAB — TROPONIN I (HIGH SENSITIVITY)
Troponin I (High Sensitivity): 8 ng/L (ref <18)
Troponin I (High Sensitivity): 11 ng/L (ref <18)

## 2019-06-03 LAB — D-DIMER, QUANTITATIVE: D-Dimer, Quant: 4 ug/mL-FEU — ABNORMAL HIGH (ref 0.00–0.50)

## 2019-06-03 IMAGING — CT CT CTA ABD/PEL W/CM AND/OR W/O CM
2 of 6 series · 15 of 46 positions shown, 17 images · IV contrast (omnipaque)
Comparison: None.

CLINICAL DATA: Abdominal aneurysm noted on chest CT.

EXAM:
CTA ABDOMEN AND PELVIS WITHOUT AND WITH CONTRAST
TECHNIQUE: Multidetector CT imaging of the abdomen and pelvis was performed
using the standard protocol during bolus administration of
intravenous contrast. Multiplanar reconstructed images and MIPs were
obtained and reviewed to evaluate the vascular anatomy.
CONTRAST:  75mL OMNIPAQUE IOHEXOL 350 MG/ML SOLN

[Series 4: axial arterial · axial · arterial · 0.58mm/px · z∈[+1004,+1349]mm · 12 of 129 slices shown, 14 images]
[im 7/129  soft-tissue]
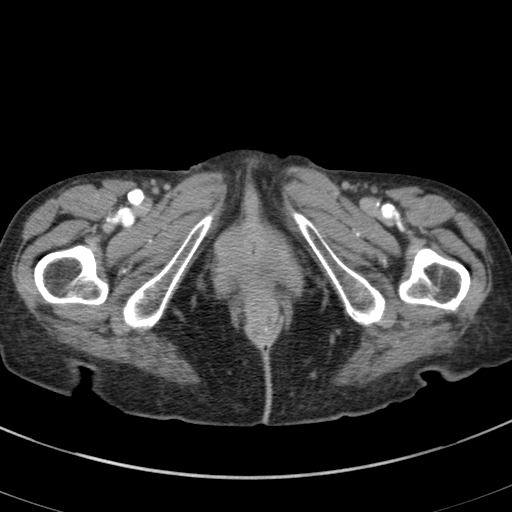
[im 7/129  bone]
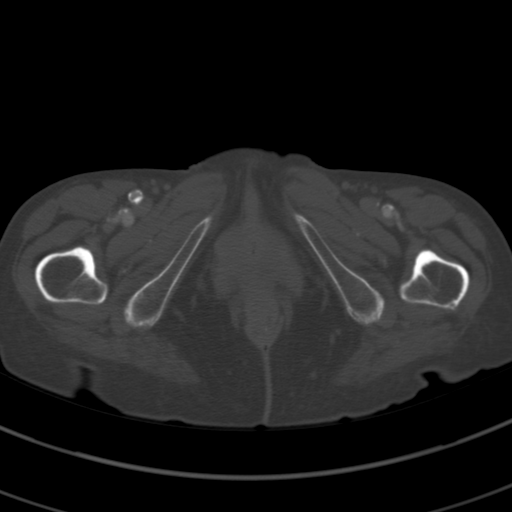
[im 19/129  soft-tissue]
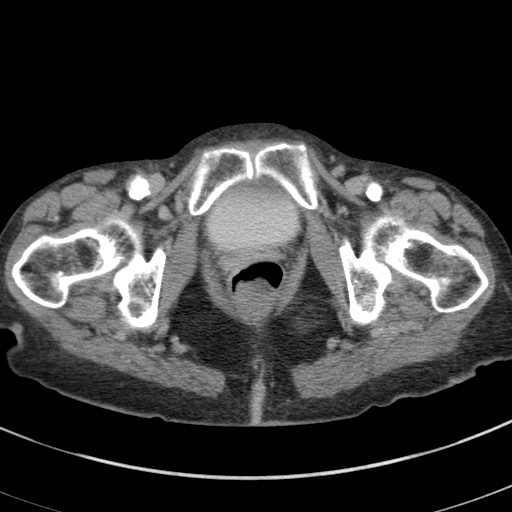
[im 31/129  soft-tissue]
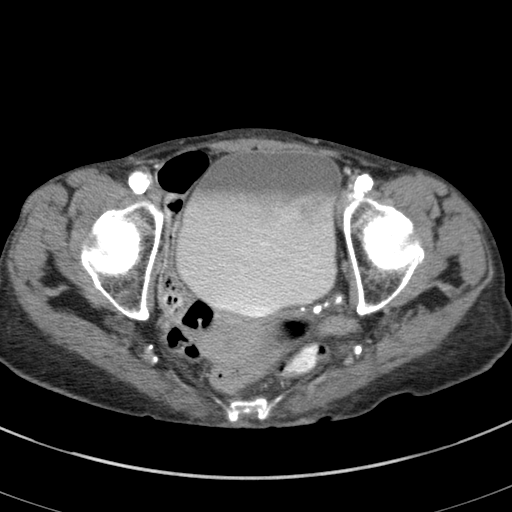
[im 37/129  soft-tissue]
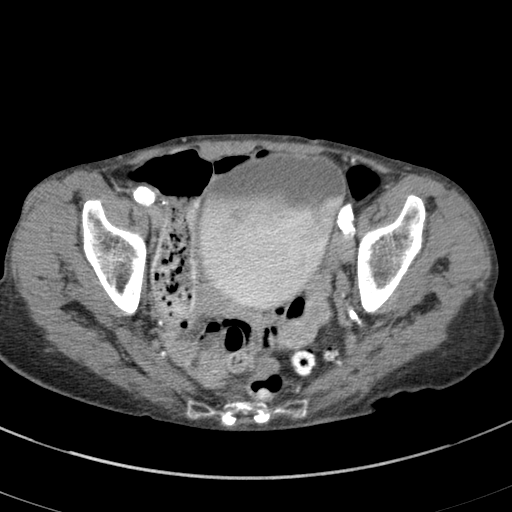
[im 49/129  soft-tissue]
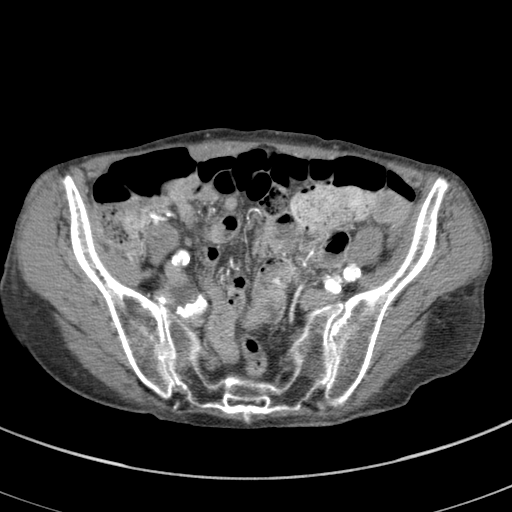
[im 61/129  soft-tissue]
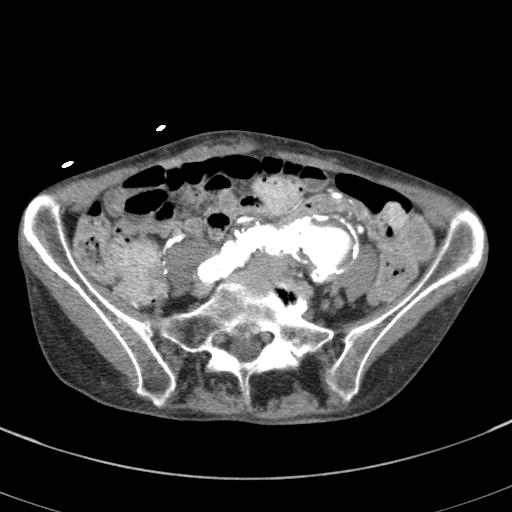
[im 68/129  soft-tissue]
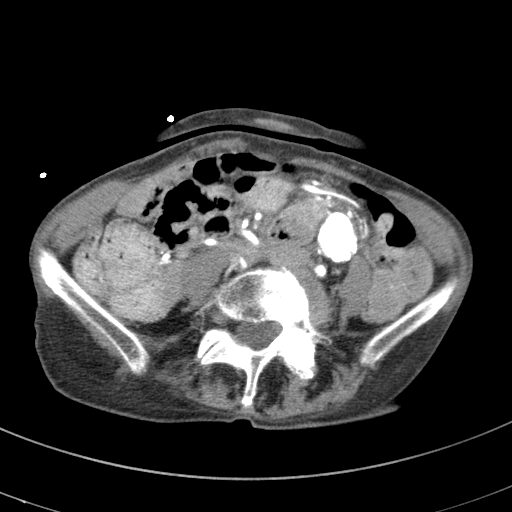
[im 80/129  soft-tissue]
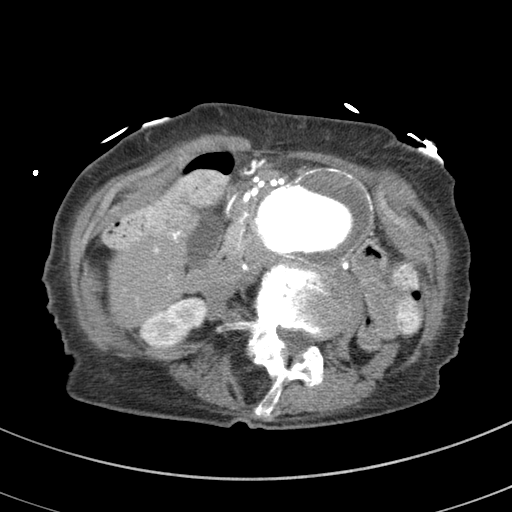
[im 92/129  soft-tissue]
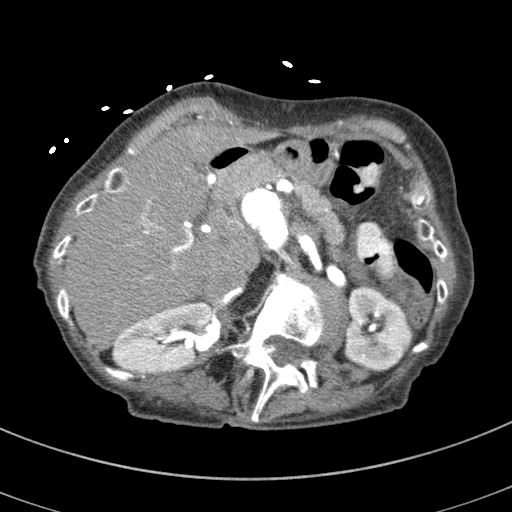
[im 92/129  bone]
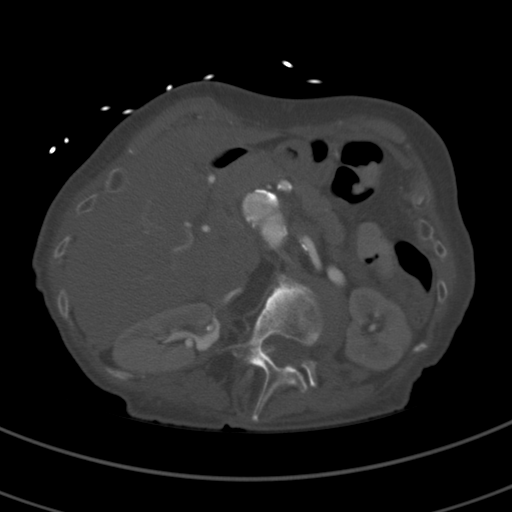
[im 98/129  soft-tissue]
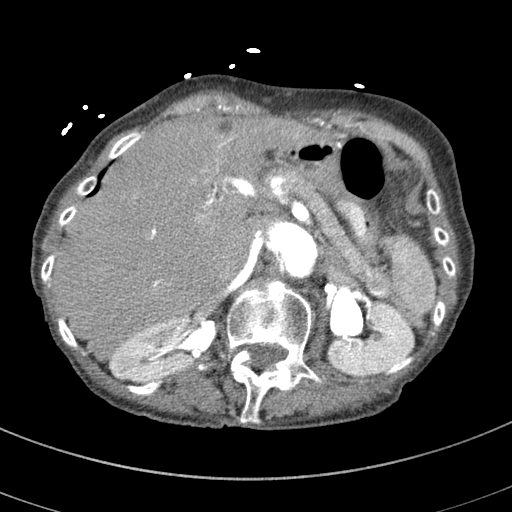
[im 110/129  soft-tissue]
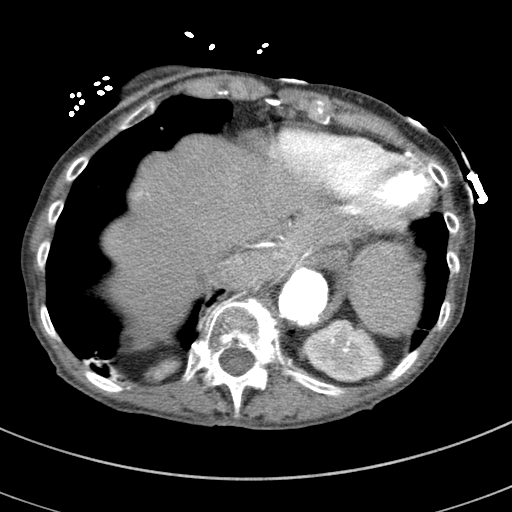
[im 122/129  soft-tissue]
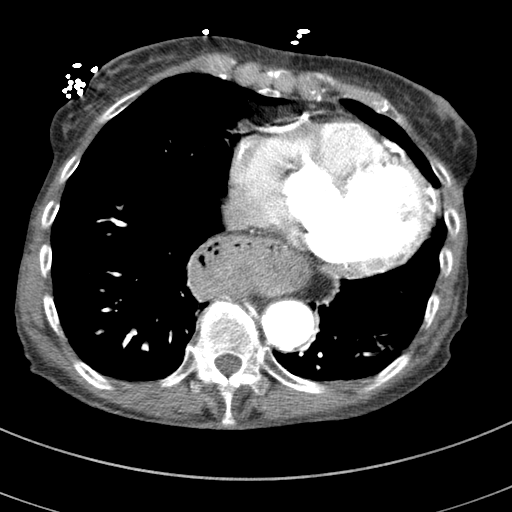

[Series 7: coronal · coronal · 0.52mm/px · 3 of 69 slices shown]
[im 18/69  soft-tissue]
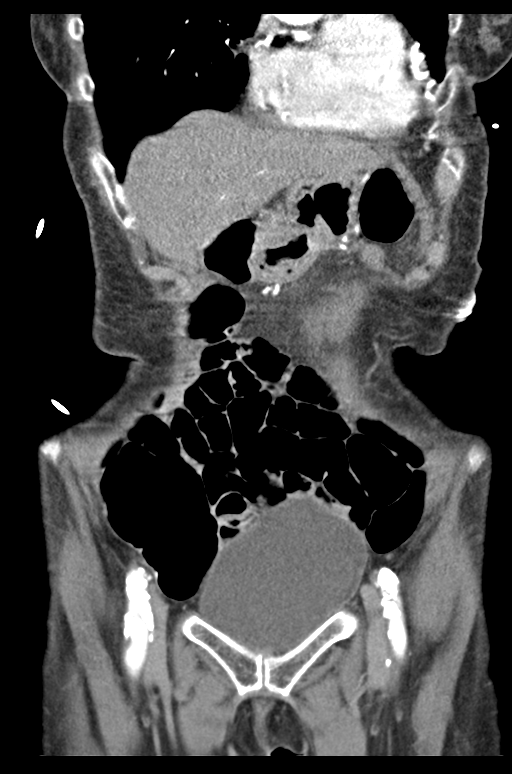
[im 35/69  soft-tissue]
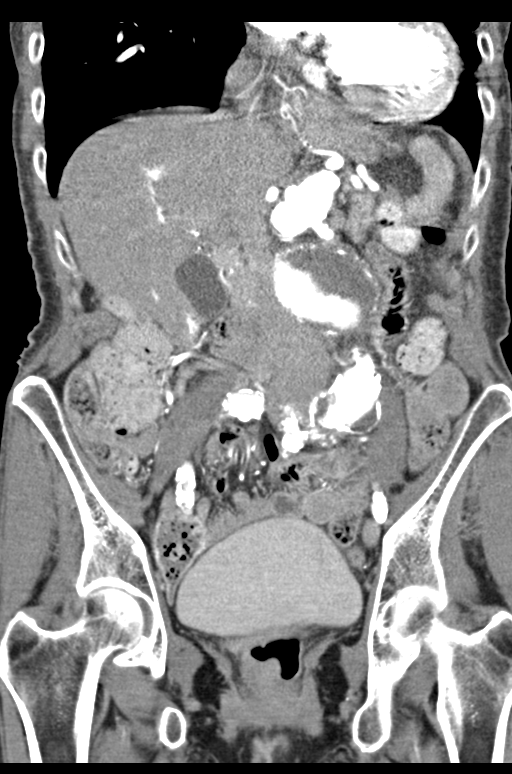
[im 52/69  soft-tissue]
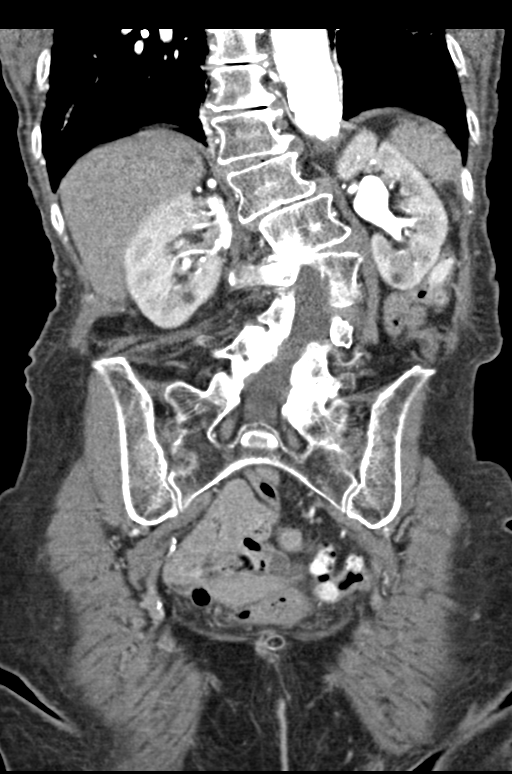

[15 of 46 positions shown; findings below may reference images not displayed]

FINDINGS: VASCULAR

Aorta: Large abdominal aortic aneurysm measures 7.1 x 5.4 cm.
Markedly tortuous aorta. The aorta tapers distally but remains
aneurysmal above the bifurcation measuring 3.1 cm. Heavily calcified
aorta.

Celiac: Focal stenosis at the origin, greater than 50%.  Patent.

SMA: Focal stenosis at the origin, less than 50%, patent

Renals: Single renal arteries bilaterally. No definite significant
stenosis.

IMA: Patent

Inflow: Heavily calcified, tortuous and ectatic iliofemoral vessels.
No aneurysm.

Proximal Outflow: Heavily calcified common femoral arteries and
proximal superficial femoral arteries.

Veins: No obvious venous abnormality within the limitations of this
arterial phase study.

Review of the MIP images confirms the above findings.

NON-VASCULAR

Lower chest: Cardiomegaly.  Moderate-sized hiatal hernia.

Hepatobiliary: Small scattered hypodensities in the liver, likely
cysts. No biliary ductal dilatation. Gallbladder unremarkable.

Pancreas: No focal abnormality or ductal dilatation.

Spleen: No focal abnormality.  Normal size.

Adrenals/Urinary Tract: No renal or adrenal mass. No hydronephrosis.
Urinary bladder unremarkable.

Stomach/Bowel: Moderate stool burden. Sigmoid diverticulosis. No
active diverticulitis. Stomach and small bowel grossly unremarkable.
No bowel obstruction.

Lymphatic: No adenopathy

Reproductive: Uterus and adnexa unremarkable.  No mass.

Other: No free fluid or free air.

Musculoskeletal: No acute bony abnormality.
IMPRESSION: Large abdominal aortic aneurysm measuring up to 7.1 cm. Extensive
mural thrombus/plaque and calcifications. Aorta is markedly tortuous
and remains slightly aneurysmal at just above the aortic bifurcation
at 3 cm. Vascular surgery consultation recommended due to increased
risk of rupture for AAA >5.5 cm. This recommendation follows ACR
consensus guidelines: White Paper of the ACR Incidental Findings
Committee II on Vascular Findings. [HOSPITAL] [C5];
[DATE]. Aortic aneurysm NOS ([C5]-[C5])

Heavily calcified aorta femoral vessels without aneurysm.

Moderate-sized hiatal hernia.

Cardiomegaly.

Sigmoid diverticulosis.  No active diverticulitis.

## 2019-06-03 IMAGING — CT CT ANGIO CHEST
2 of 6 series · 18 of 36 positions shown · IV contrast (OMNIPAQUE 350)
Comparison: Chest radiograph dated [DATE].

CLINICAL DATA: 78-year-old female with shortness of breath.

EXAM:
CT ANGIOGRAPHY CHEST WITH CONTRAST
TECHNIQUE: Multidetector CT imaging of the chest was performed using the
standard protocol during bolus administration of intravenous
contrast. Multiplanar CT image reconstructions and MIPs were
obtained to evaluate the vascular anatomy.
CONTRAST:  70mL OMNIPAQUE IOHEXOL 350 MG/ML SOLN

[Series 6: thins · axial · 0.57mm/px · z∈[+1257,+1513]mm · 17 of 288 slices shown]
[im 16/288  lung]
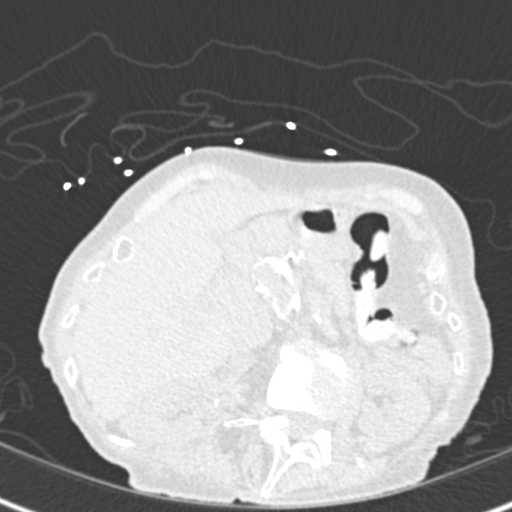
[im 32/288  mediastinal]
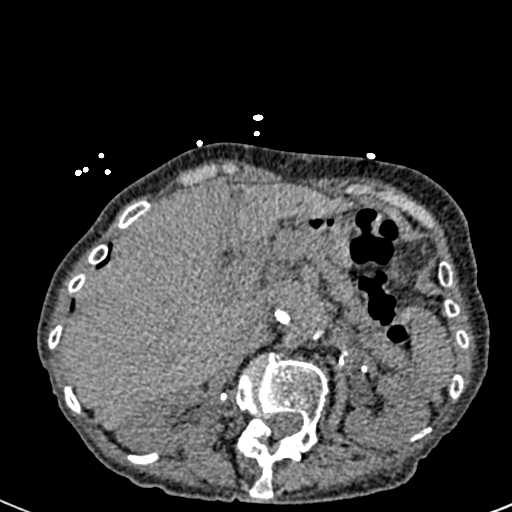
[im 48/288  lung]
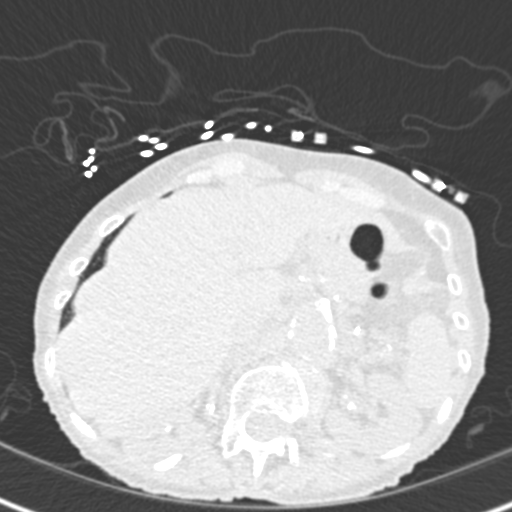
[im 64/288  mediastinal]
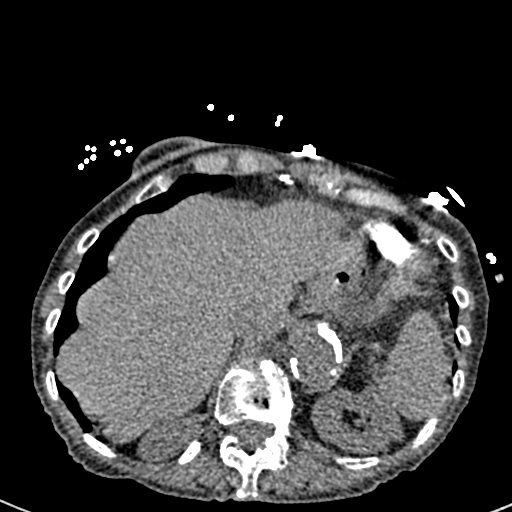
[im 80/288  lung]
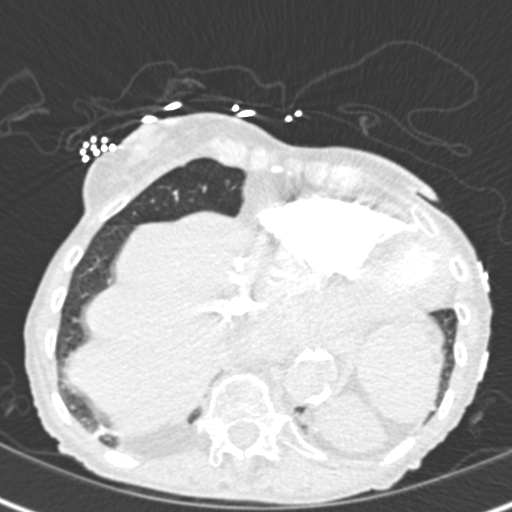
[im 96/288  mediastinal]
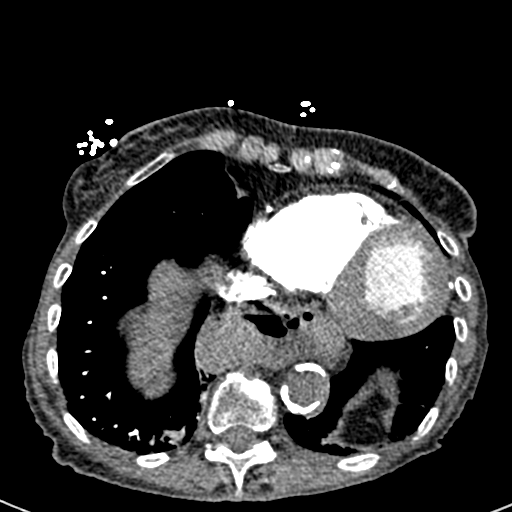
[im 112/288  lung]
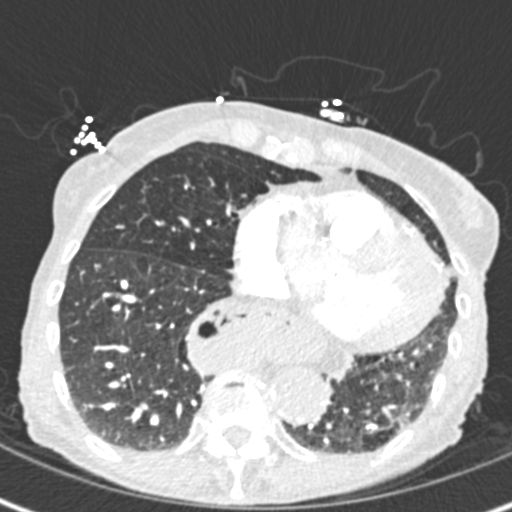
[im 128/288  mediastinal]
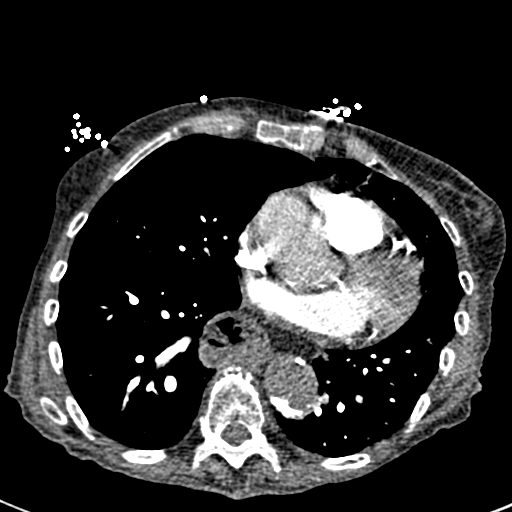
[im 144/288  lung]
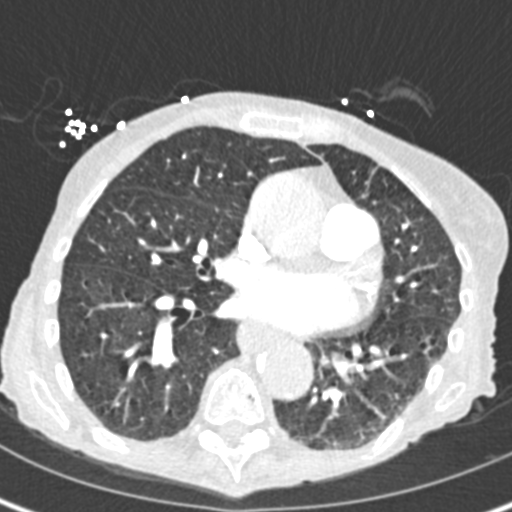
[im 160/288  mediastinal]
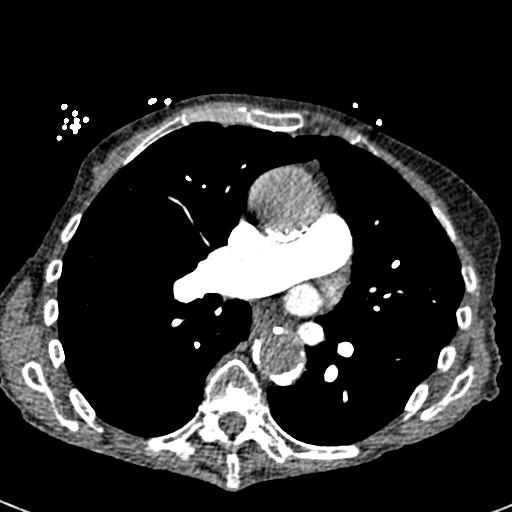
[im 176/288  lung]
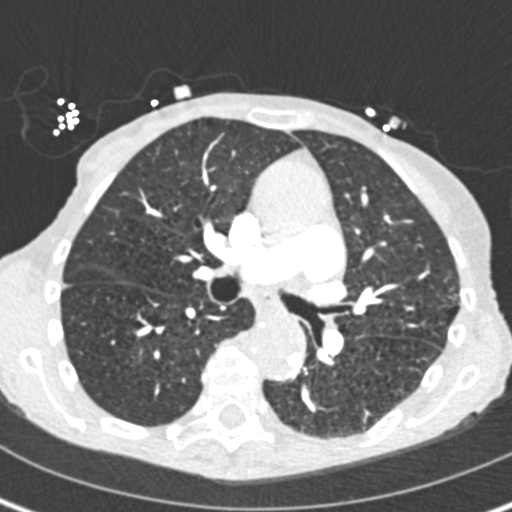
[im 192/288  mediastinal]
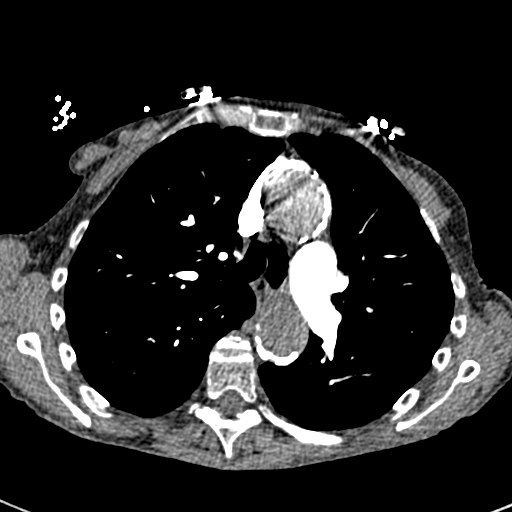
[im 208/288  lung]
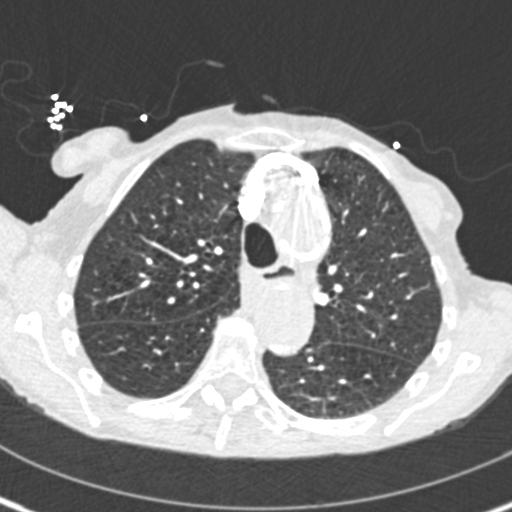
[im 224/288  mediastinal]
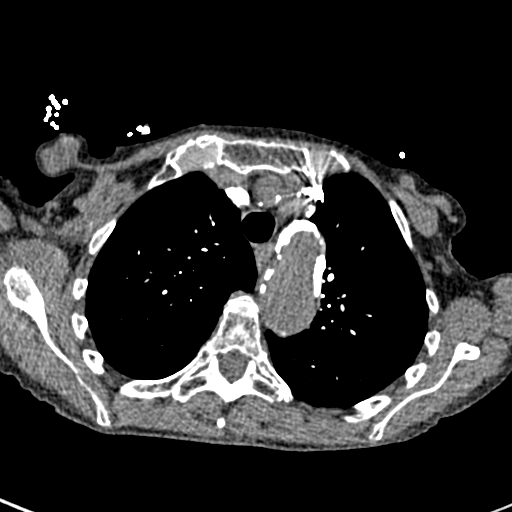
[im 240/288  lung]
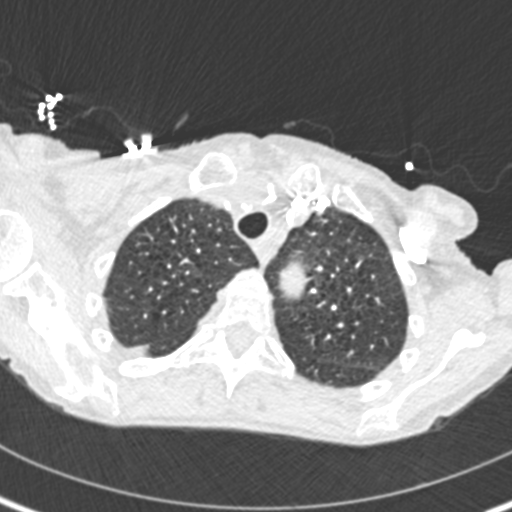
[im 256/288  mediastinal]
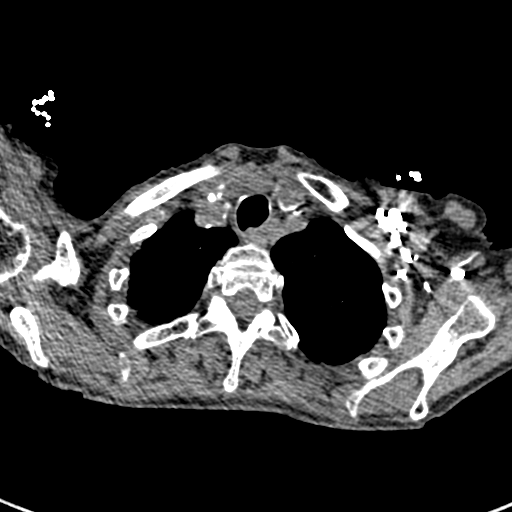
[im 272/288  lung]
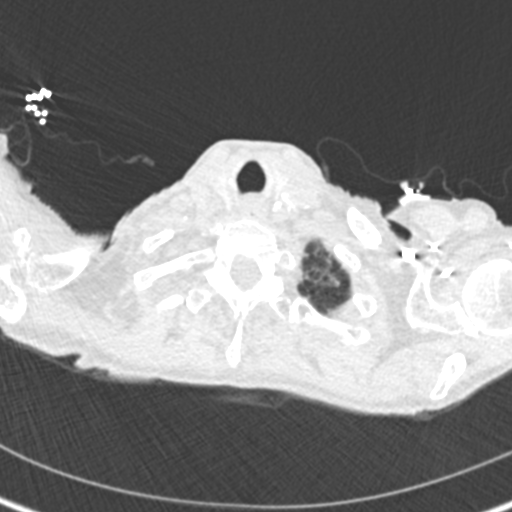

[Series 8: coronal mpr · coronal · 0.53mm/px · 1 of 113 slices shown]
[im 57/113  mediastinal]
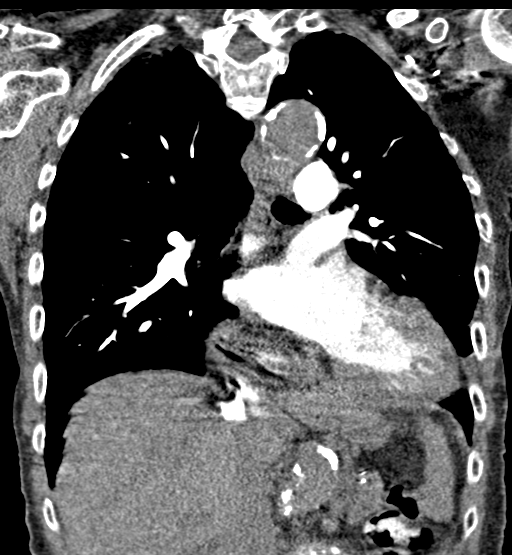

[18 of 36 positions shown; findings below may reference images not displayed]

FINDINGS: Cardiovascular: There is mild cardiomegaly. Advanced coronary
vascular calcification. There is retrograde flow of contrast from
the right atrium into the IVC in keeping with a degree of right
heart dysfunction. There is aneurysmal dilatation of the ascending
aorta measuring up to 4.3 cm. There is advanced atherosclerotic
calcification of the aorta. There is mild dilatation of the
descending thoracic aorta measuring up to 3.3 cm in diameter.
Evaluation of the aorta is very limited due to suboptimal
opacification and timing of the contrast. No pulmonary artery
embolus identified.

Mediastinum/Nodes: There is no hilar or mediastinal adenopathy.
There is a large hiatal hernia. No mediastinal fluid collection.

Lungs/Pleura: Emphysema. There is a 6 mm right upper lobe nodule
(series 7, image 46) with somewhat irregular or spiculated margins
concerning for malignancy. No consolidative changes. There is no
pleural effusion or pneumothorax. The central airways are patent.

Upper Abdomen: Partially visualized 6.7 cm aneurysm of the abdominal
aorta. CT angiography of the abdomen and pelvis has been ordered and
in progress. There is a 4 mm right renal upper pole stone. A 1 cm
hypodense lesion in the anterior liver.

Musculoskeletal: Degenerative changes of the spine. No acute osseous
pathology.

Review of the MIP images confirms the above findings.
IMPRESSION: 1. No CT evidence of pulmonary embolism.
2. Cardiomegaly with coronary vascular calcification.
3. Advanced Aortic Atherosclerosis ([AL]-[AL]) and Emphysema
([AL]-[AL]).
4. A 4.3 cm aneurysmal dilatation of the ascending thoracic aorta.
Recommend annual imaging followup by CTA or MRA. This recommendation
follows [AL] ACCF/AHA/AATS/ACR/ASA/SCA/ZEV/ZEV/ZEV/ZEV Guidelines
for the Diagnosis and Management of Patients with Thoracic Aortic
Disease. Circulation. [AL]; 121: E266-e369. Aortic aneurysm NOS
([AL]-[AL])
5. A 6 mm right upper lobe nodule with irregular or spiculated
margins concerning for malignancy. Clinical correlation and thoracic
consult is advised.
6. Partially visualized 6.7 cm abdominal aortic aneurysm.

## 2019-06-03 IMAGING — CR DG CHEST 2V
2 series · 2 of 2 positions shown · non-contrast
Comparison: [DATE]

CLINICAL DATA: Chest pain on the right

EXAM:
CHEST - 2 VIEW

[w chest pa]
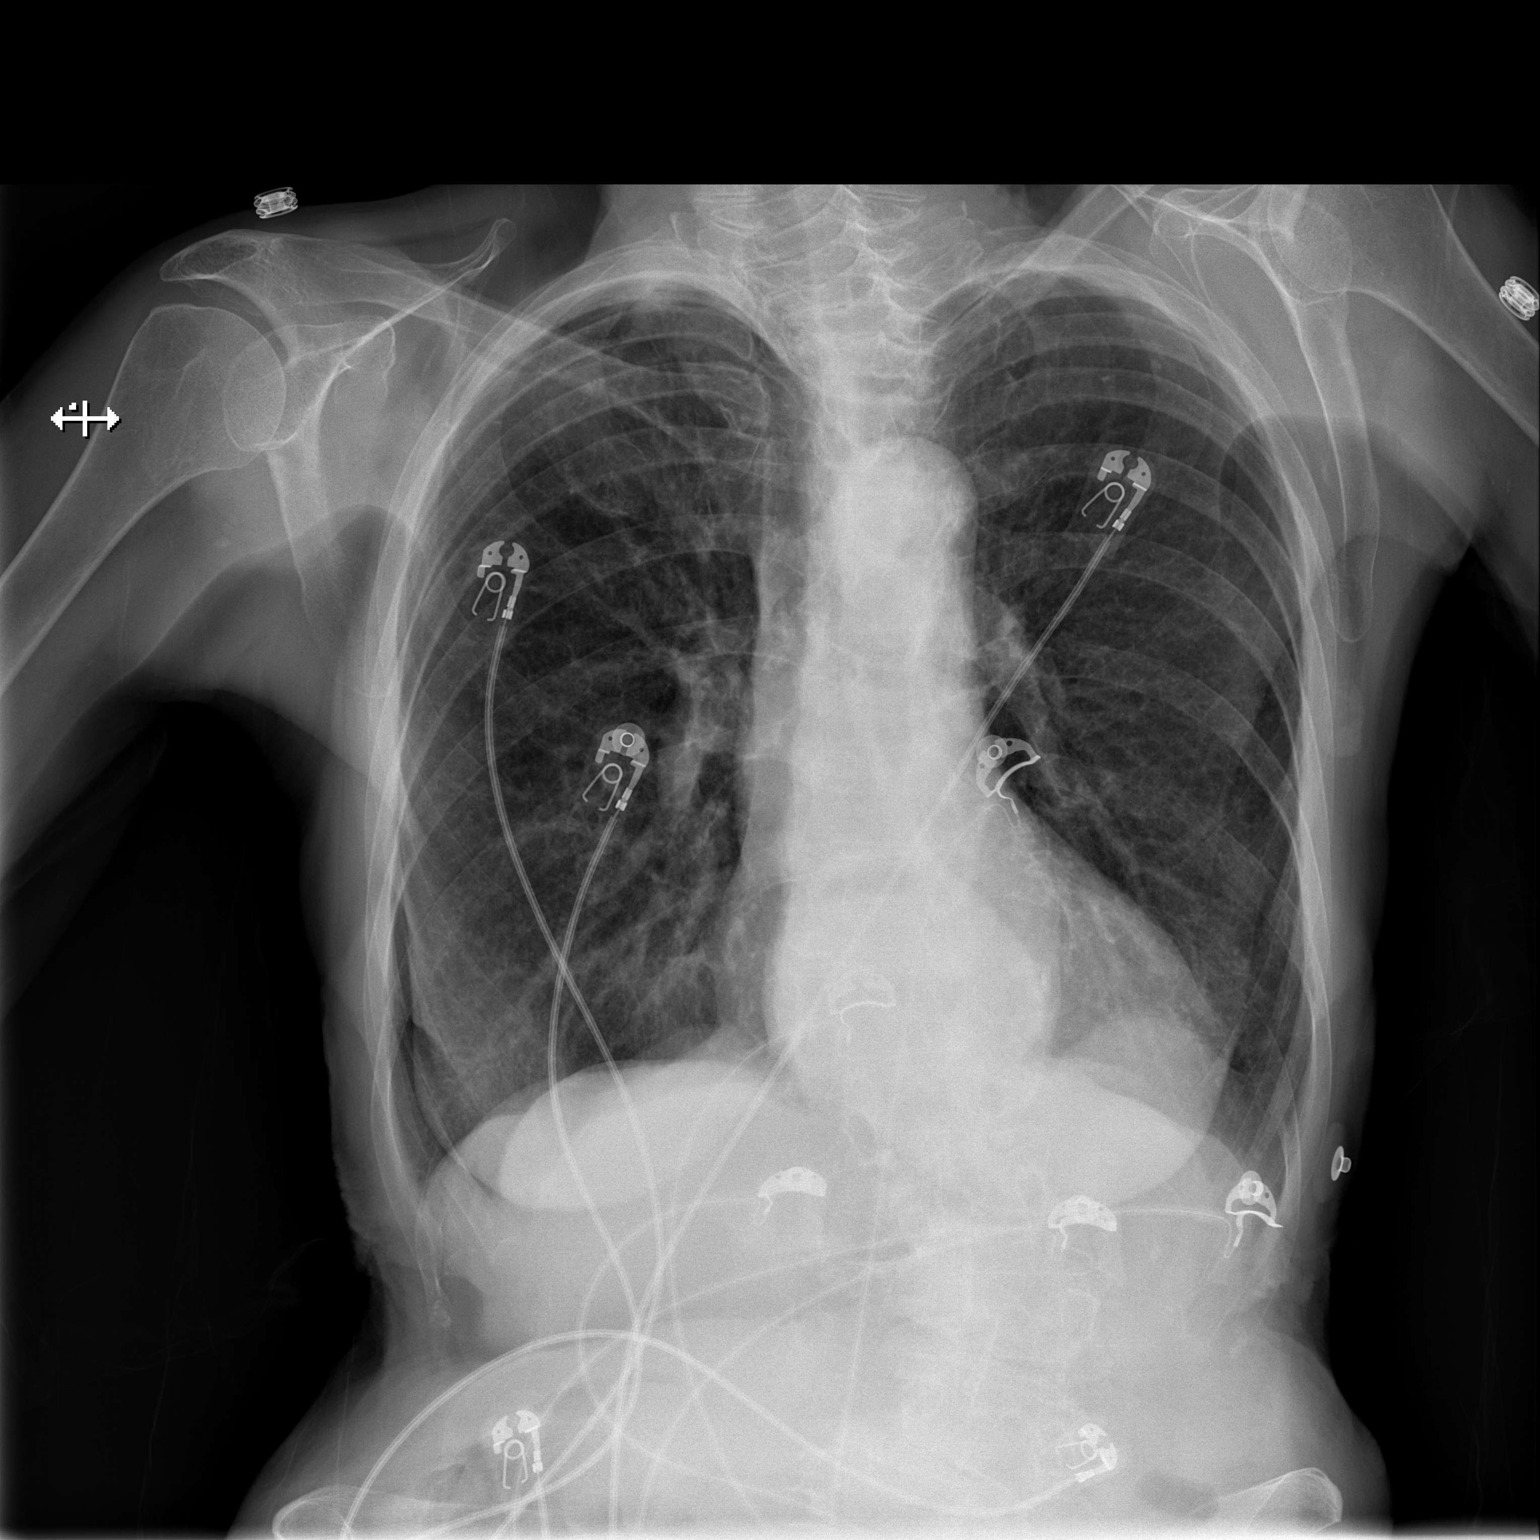

[w chest lat]
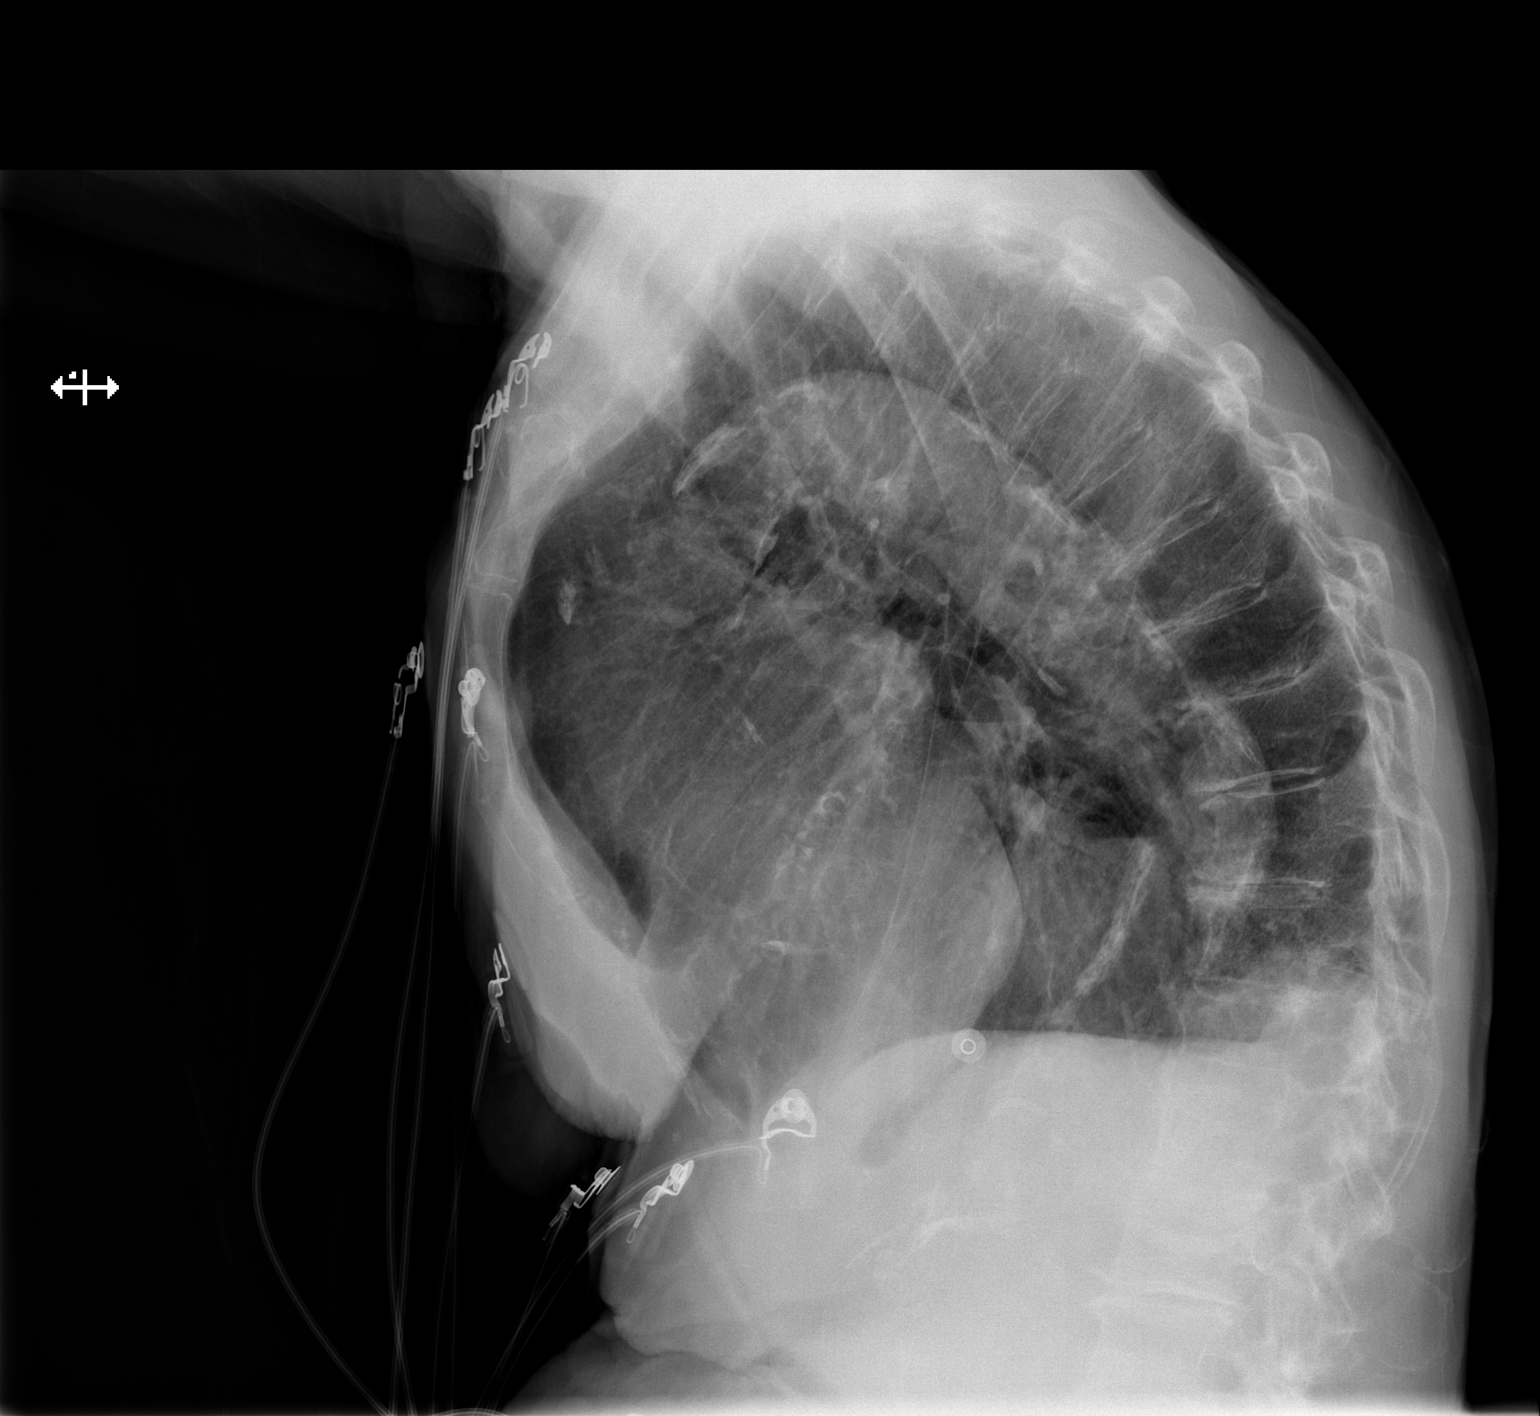

[2 of 2 positions shown; findings below may reference images not displayed]

FINDINGS: Cardiac shadows within normal limits. Aortic calcifications are
again seen. Hiatal hernia is noted. The lungs are hyperinflated
consistent with COPD. No acute bony abnormality is noted.
Degenerative changes of the thoracic spine are seen.
IMPRESSION: Hiatal hernia.

No acute abnormality seen.

## 2019-06-03 MED ORDER — IOHEXOL 350 MG/ML SOLN
100.0000 mL | Freq: Once | INTRAVENOUS | Status: AC | PRN
  Administered 2019-06-03: 75 mL via INTRAVENOUS

## 2019-06-03 MED ORDER — SODIUM CHLORIDE (PF) 0.9 % IJ SOLN
INTRAMUSCULAR | Status: AC
  Filled 2019-06-03: qty 50

## 2019-06-03 MED ORDER — IOHEXOL 350 MG/ML SOLN
100.0000 mL | Freq: Once | INTRAVENOUS | Status: AC | PRN
  Administered 2019-06-03: 70 mL via INTRAVENOUS

## 2019-06-03 MED ORDER — ACETAMINOPHEN 325 MG PO TABS
650.0000 mg | ORAL_TABLET | Freq: Once | ORAL | Status: AC
  Administered 2019-06-03: 650 mg via ORAL
  Filled 2019-06-03: qty 2

## 2019-06-03 MED ORDER — BACLOFEN 5 MG PO TABS: 5.0000 mg | ORAL_TABLET | Freq: Two times a day (BID) | ORAL | 0 refills | Status: DC

## 2019-06-03 MED ORDER — DICLOFENAC SODIUM 1 % EX CREA: 1.0000 "application " | TOPICAL_CREAM | Freq: Two times a day (BID) | CUTANEOUS | 0 refills | Status: DC

## 2019-06-03 MED ORDER — SODIUM CHLORIDE 0.9% FLUSH
3.0000 mL | Freq: Once | INTRAVENOUS | Status: AC
  Administered 2019-06-03: 3 mL via INTRAVENOUS

## 2019-06-03 NOTE — ED Notes (Signed)
MD at bedside. 

## 2019-06-03 NOTE — ED Provider Notes (Signed)
[facility] DEPT Provider Note   CSN: [id number] Arrival date & time: 06/03/19  1157     History Chief Complaint  Patient presents with  . Chest Pain    Jennifer [name] is a 79 y.o. female.  HPI      Jennifer [name] is a 79 y.o. female, with a history of tobacco use, GERD, HTN, presenting to the ED with chest pain beginning Feb 4. Patient was trying to twist open a bottle when she felt sudden pain in the right upper chest, constant and dull, intermittently sharp, was worse with movement of torso and right shoulder.  Over time, constant pain improved and she only had pain with change in position. She denies outright shortness of breath, however, does note when her pain spikes, it does make it painful to breathe.  Denies fever/chills, nausea/vomiting, abdominal pain, back pain, neurologic deficits, direct trauma, dizziness, neck pain, cough, lower extremity edema/pain, or any other complaints.   Past Medical History:  Diagnosis Date  . ANEMIA-NOS   . Cataract    right cataract removed  . Clotting disorder (HCC)    Pletal  . Depression    no longer taking meds/ resolved  . DYSLIPIDEMIA   . GERD (gastroesophageal reflux disease)   . Hemorrhoids   . HYPERTENSION   . Negative colorectal cancer screening using Cologuard test   . Osteoporosis, senile    DEXA 03/2014: -3.3 R fem  . Peripheral vascular disease, unspecified ([place]) dx 02/2010 ABI  . SMOKER     Patient Active Problem List   Diagnosis Date Noted  . Hiatal hernia, large 01/09/2018  . GERD (gastroesophageal reflux disease) 12/08/2016  . History of Clostridium difficile infection 11/29/2016  . Prediabetes 11/02/2016  . Carotid arterial disease ([place]) 10/20/2015  . Anxiety state   . PAD (peripheral artery disease) ([place]) 10/10/2011  . Osteoporosis, senile 05/17/2011  . Dyslipidemia 05/03/2009  . Tobacco abuse 04/14/2009  . Essential hypertension 04/14/2009    Past Surgical  History:  Procedure Laterality Date  . CATARACT EXTRACTION W/ INTRAOCULAR LENS IMPLANT Right 03/12/14   groat  . HEMORRHOID SURGERY  05/18/2011   Procedure: HEMORRHOIDECTOMY;  Surgeon: Merrie Roof, MD;  Location: [facility];  Service: General;  Laterality: N/A;  hemorrhoidectomy, three columns, internal and external  . TONSILLECTOMY  1946  . TUBAL LIGATION  1979  . UPPER GASTROINTESTINAL ENDOSCOPY  01/03/2018     OB History   No obstetric history on file.     Family History  Problem Relation Age of Onset  . Breast cancer Mother   . Lung cancer Father   . Hypertension Brother   . Colon cancer Neg Hx   . Esophageal cancer Neg Hx   . Stomach cancer Neg Hx   . Rectal cancer Neg Hx     Social History   Tobacco Use  . Smoking status: Current Every Day Smoker    Packs/day: 0.50    Years: 62.00    Pack years: 31.00    Types: Cigarettes  . Smokeless tobacco: Never Used  Substance Use Topics  . Alcohol use: Yes    Alcohol/week: 0.0 standard drinks    Comment: Social- 1 scotch, 1 glass wine weekly  . Drug use: No    Home Medications Prior to Admission medications   Medication Sig Start Date End Date Taking? Authorizing Provider  ALPRAZolam (XANAX) 0.25 MG tablet TAKE 1 TABLET BY MOUTH EVERY NIGHT AT BEDTIME AS NEEDED FOR ANXIETY  OR SLEEP Patient taking differently: Take 0.25 mg by mouth at bedtime as needed for anxiety or sleep.  02/05/19  Yes Burns, Bobette Mo, MD  amLODipine (NORVASC) 5 MG tablet TAKE 1 TABLET BY MOUTH EVERY NIGHT AT BEDTIME Patient taking differently: Take 5 mg by mouth daily.  06/03/19  Yes Burns, Bobette Mo, MD  aspirin 81 MG tablet Take 81 mg by mouth daily before breakfast.    Yes [provider]  atorvastatin (LIPITOR) 20 MG tablet TAKE 1 TABLET BY MOUTH EVERY DAY Patient taking differently: Take 20 mg by mouth daily at 6 PM.  05/05/19  Yes Burns, Bobette Mo, MD  calcium-vitamin D (OSCAL WITH D) 500-200 MG-UNIT per tablet Take 2 tablets by mouth daily.    Yes [provider]  cilostazol (PLETAL) 50 MG tablet TAKE 1 TABLET(50 MG) BY MOUTH TWICE DAILY Patient taking differently: Take 50 mg by mouth 2 (two) times daily.  12/13/18  Yes Burns, Bobette Mo, MD  omeprazole (PRILOSEC) 40 MG capsule TAKE 1 CAPSULE(40 MG) BY MOUTH DAILY Patient taking differently: Take 40 mg by mouth daily.  12/19/18  Yes Hilarie Fredrickson, MD  potassium chloride SA (K-DUR) 20 MEQ tablet TAKE 1 TABLET(20 MEQ) BY MOUTH DAILY Patient taking differently: Take 20 mEq by mouth daily.  11/12/18  Yes Pincus Sanes, MD    Allergies    Patient has no known allergies.  Review of Systems   Review of Systems  Constitutional: Negative for chills and fever.  Eyes: Negative for visual disturbance.  Respiratory: Negative for cough.   Cardiovascular: Negative for leg swelling.  Gastrointestinal: Negative for abdominal pain, diarrhea, nausea and vomiting.  Musculoskeletal:       Chest pain and soreness  Neurological: Negative for dizziness, syncope, weakness, numbness and headaches.  All other systems reviewed and are negative.   Physical Exam Updated Vital Signs BP 137/89   Pulse 94   Temp 97.7 F (36.5 C) (Oral)   Resp (!) 24   SpO2 96%   Physical Exam Vitals and nursing note reviewed.  Constitutional:      General: She is not in acute distress.    Appearance: She is well-developed. She is not diaphoretic.  HENT:     Head: Normocephalic and atraumatic.     Mouth/Throat:     Mouth: Mucous membranes are moist.     Pharynx: Oropharynx is clear.  Eyes:     Conjunctiva/sclera: Conjunctivae normal.  Cardiovascular:     Rate and Rhythm: Normal rate and regular rhythm.     Pulses: Normal pulses.          Radial pulses are 2+ on the right side and 2+ on the left side.       Posterior tibial pulses are 2+ on the right side and 2+ on the left side.     Heart sounds: Normal heart sounds.     Comments: Tactile temperature in the extremities appropriate and equal  bilaterally. Pulmonary:     Effort: Pulmonary effort is normal. No respiratory distress.     Breath sounds: Normal breath sounds.  Chest:     Chest wall: Tenderness present. No deformity, swelling or crepitus.       Comments: Patient has no color change, swelling, deformity, or instability to the chest. She does not endorse pain at rest, however, when she twists her torso or sits upright she then has pain in the same area as her tenderness. Abdominal:     Palpations: Abdomen  is soft.     Tenderness: There is no abdominal tenderness. There is no guarding.  Musculoskeletal:     Cervical back: Neck supple.     Right lower leg: No edema.     Left lower leg: No edema.  Lymphadenopathy:     Cervical: No cervical adenopathy.  Skin:    General: Skin is warm and dry.  Neurological:     Mental Status: She is alert.     Comments: No noted acute cognitive deficit. Sensation grossly intact to light touch in the extremities.   Grip strengths equal bilaterally.   Strength 5/5 in all extremities.  No gait disturbance.  Coordination intact.  Cranial nerves III-XII grossly intact.  Handles oral secretions without noted difficulty.  No noted phonation or speech deficit. No facial droop.   Psychiatric:        Mood and Affect: Mood and affect normal.        Speech: Speech normal.        Behavior: Behavior normal.     ED Results / Procedures / Treatments   Labs (all labs ordered are listed, but only abnormal results are displayed) Labs Reviewed  BASIC METABOLIC PANEL - Abnormal; Notable for the following components:      Result Value   Sodium 132 (*)    Chloride 97 (*)    Glucose, Bld 102 (*)    GFR calc non Af Amer 54 (*)    All other components within normal limits  CBC - Abnormal; Notable for the following components:   Hemoglobin 11.6 (*)    HCT 35.4 (*)    All other components within normal limits  D-DIMER, QUANTITATIVE (NOT AT [facility])  TROPONIN I (HIGH SENSITIVITY)  TROPONIN I  (HIGH SENSITIVITY)    EKG EKG Interpretation  Date/Time:  Tuesday June 03 2019 12:13:23 EST Ventricular Rate:  89 PR Interval:    QRS Duration: 60 QT Interval:  362 QTC Calculation: 428 R Axis:   36 Text Interpretation: Sinus rhythm Ventricular premature complex Consider left atrial enlargement Left ventricular hypertrophy Probable anterior infarct, age indeterminate No significant change since last tracing Confirmed by Gwyneth Sprout ([id number]) on 06/03/2019 12:47:20 PM   Radiology DG Chest 2 View  Result Date: 06/03/2019 CLINICAL DATA:  Chest pain on the right EXAM: CHEST - 2 VIEW COMPARISON:  05/11/2011 FINDINGS: Cardiac shadows within normal limits. Aortic calcifications are again seen. Hiatal hernia is noted. The lungs are hyperinflated consistent with COPD. No acute bony abnormality is noted. Degenerative changes of the thoracic spine are seen. IMPRESSION: Hiatal hernia. No acute abnormality seen. Electronically Signed   By: Alcide Clever M.D.   On: 06/03/2019 13:09    Procedures Procedures (including critical care time)  Medications Ordered in ED Medications  sodium chloride flush (NS) 0.9 % injection 3 mL (3 mLs Intravenous Given 06/03/19 1244)  acetaminophen (TYLENOL) tablet 650 mg (650 mg Oral Given 06/03/19 1408)    ED Course  I have reviewed the triage vital signs and the nursing notes.  Pertinent labs & imaging results that were available during my care of the patient were reviewed by me and considered in my medical decision making (see chart for details).    MDM Rules/Calculators/A&P                      Patient presents with pain in the right chest that arose while opening a bottle. Patient is nontoxic appearing, afebrile, not tachycardic, not tachypneic, not hypotensive,  maintains excellent SPO2 on room air, and is in no apparent distress.  No acute abnormality on chest x-ray.  Low suspicion for ACS. HEART score is 3, indicating low risk for a cardiac event.  EKG  without evidence of acute ischemia or pathologic/symptomatic arrhythmia. Troponin negative.  Wells criteria score is 0, indicating low risk for PE.   Dissection was considered, but thought less likely base on: History and description of the pain are not suggestive, patient is not ill-appearing, lack of risk factors, equal bilateral pulses, lack of neurologic deficits, no widened mediastinum on chest x-ray.  Findings and plan of care discussed with Gwyneth Sprout, MD. Dr. Anitra Lauth personally evaluated and examined this patient.  End of shift patient care handoff report given to Felicie Morn, NP. Plan: Awaiting D-dimer result.  If negative, patient can be discharged with pain management for suspected MSK pain.   Vitals:   06/03/19 1230 06/03/19 1430 06/03/19 1500 06/03/19 1530  BP: 140/73 123/78 115/75 129/83  Pulse: 81 69 78 71  Resp: 20 18 (!) 21 (!) 23  Temp:      TempSrc:      SpO2: 96% 96% 96% 97%    Final Clinical Impression(s) / ED Diagnoses Final diagnoses:  None    Rx / DC Orders ED Discharge Orders    None       Concepcion Living 06/03/19 1557    Gwyneth Sprout, MD 06/06/19 1702

## 2019-06-03 NOTE — ED Triage Notes (Signed)
Patient is from home and transported via [facility]. Patient complains of chest wall pain that started Thursday when opening a bottle. Pain has got worse with movement and palpating. Denies any shortness of breath or dizziness. Patient took ASA 324mg  PO prior to EMS arrival.

## 2019-06-03 NOTE — ED Notes (Signed)
Patient was verbalized discharge instructions. Pt had no further questions at this time. NAD. 

## 2019-06-03 NOTE — ED Provider Notes (Signed)
Patient received in sign out from [place]. Ander Slade, [place]. Patient presented for right sided anterior chest pain. She noted the pain last Thursday when opening a cola bottle. Pain has increased over the last several days. It seems to be worse with movement. Occasional dyspnea with pain episodes. D-dimer of 4. Initial troponin of 8, repeat of 11. Not tachycardic or tachypneic on my initial exam. No increased oxygen demand.  No PE on CTA of chest. AAA noted. CTA of abdomen and pelvis reveals large aortic aneurysm of 7.1 x 5.4 cm. Patient does not have abdominal pain.  Discussed with Dr. Edilia Bo. He has reviewed films. Will follow-up in the office.  Chest pain seems to be MSK in origin. Will treat with topical NSAID and baclofen.   Patient aware of CT chest findings. Will follow-up with PCP for pulmonary nodule.  Patient discussed with Dr. Broadus John. Patient will be discharged home. Return precautions discussed.   Results for orders placed or performed during the hospital encounter of 06/03/19  Basic metabolic panel  Result Value Ref Range   Sodium 132 (L) 135 - 145 mmol/L   Potassium 4.3 3.5 - 5.1 mmol/L   Chloride 97 (L) 98 - 111 mmol/L   CO2 24 22 - 32 mmol/L   Glucose, Bld 102 (H) 70 - 99 mg/dL   BUN 18 8 - 23 mg/dL   Creatinine, Ser 2.75 0.44 - 1.00 mg/dL   Calcium 17.0 8.9 - 01.7 mg/dL   GFR calc non Af Amer 54 (L) >60 mL/min   GFR calc Af Amer >60 >60 mL/min   Anion gap 11 5 - 15  CBC  Result Value Ref Range   WBC 6.3 4.0 - 10.5 K/uL   RBC 4.22 3.87 - 5.11 MIL/uL   Hemoglobin 11.6 (L) 12.0 - 15.0 g/dL   HCT 49.4 (L) 49.6 - 75.9 %   MCV 83.9 80.0 - 100.0 fL   MCH 27.5 26.0 - 34.0 pg   MCHC 32.8 30.0 - 36.0 g/dL   RDW 16.3 84.6 - 65.9 %   Platelets 268 150 - 400 K/uL   nRBC 0.0 0.0 - 0.2 %  D-dimer, quantitative (not at [facility])  Result Value Ref Range   D-Dimer, Quant 4.00 (H) 0.00 - 0.50 ug/mL-FEU  Troponin I (High Sensitivity)  Result Value Ref Range   Troponin I (High Sensitivity) 8  <18 ng/L  Troponin I (High Sensitivity)  Result Value Ref Range   Troponin I (High Sensitivity) 11 <18 ng/L   DG Chest 2 View  Result Date: 06/03/2019 CLINICAL DATA:  Chest pain on the right EXAM: CHEST - 2 VIEW COMPARISON:  05/11/2011 FINDINGS: Cardiac shadows within normal limits. Aortic calcifications are again seen. Hiatal hernia is noted. The lungs are hyperinflated consistent with COPD. No acute bony abnormality is noted. Degenerative changes of the thoracic spine are seen. IMPRESSION: Hiatal hernia. No acute abnormality seen. Electronically Signed   By: Alcide Clever M.D.   On: 06/03/2019 13:09   CT Angio Chest PE W and/or Wo Contrast  Result Date: 06/03/2019 CLINICAL DATA:  79 year old female with shortness of breath. EXAM: CT ANGIOGRAPHY CHEST WITH CONTRAST TECHNIQUE: Multidetector CT imaging of the chest was performed using the standard protocol during bolus administration of intravenous contrast. Multiplanar CT image reconstructions and MIPs were obtained to evaluate the vascular anatomy. CONTRAST:  71mL OMNIPAQUE IOHEXOL 350 MG/ML SOLN COMPARISON:  Chest radiograph dated 06/03/2019. FINDINGS: Cardiovascular: There is mild cardiomegaly. Advanced coronary vascular calcification. There is retrograde flow of  contrast from the right atrium into the IVC in keeping with a degree of right heart dysfunction. There is aneurysmal dilatation of the ascending aorta measuring up to 4.3 cm. There is advanced atherosclerotic calcification of the aorta. There is mild dilatation of the descending thoracic aorta measuring up to 3.3 cm in diameter. Evaluation of the aorta is very limited due to suboptimal opacification and timing of the contrast. No pulmonary artery embolus identified. Mediastinum/Nodes: There is no hilar or mediastinal adenopathy. There is a large hiatal hernia. No mediastinal fluid collection. Lungs/Pleura: Emphysema. There is a 6 mm right upper lobe nodule (series 7, image 46) with somewhat  irregular or spiculated margins concerning for malignancy. No consolidative changes. There is no pleural effusion or pneumothorax. The central airways are patent. Upper Abdomen: Partially visualized 6.7 cm aneurysm of the abdominal aorta. CT angiography of the abdomen and pelvis has been ordered and in progress. There is a 4 mm right renal upper pole stone. A 1 cm hypodense lesion in the anterior liver. Musculoskeletal: Degenerative changes of the spine. No acute osseous pathology. Review of the MIP images confirms the above findings. IMPRESSION: 1. No CT evidence of pulmonary embolism. 2. Cardiomegaly with coronary vascular calcification. 3. Advanced Aortic Atherosclerosis (ICD10-I70.0) and Emphysema (ICD10-J43.9). 4. A 4.3 cm aneurysmal dilatation of the ascending thoracic aorta. Recommend annual imaging followup by CTA or MRA. This recommendation follows 2010 ACCF/AHA/AATS/ACR/ASA/SCA/SCAI/SIR/STS/SVM Guidelines for the Diagnosis and Management of Patients with Thoracic Aortic Disease. Circulation. 2010; 121: E563-J497. Aortic aneurysm NOS (ICD10-I71.9) 5. A 6 mm right upper lobe nodule with irregular or spiculated margins concerning for malignancy. Clinical correlation and thoracic consult is advised. 6. Partially visualized 6.7 cm abdominal aortic aneurysm. Electronically Signed   By: Anner Crete M.D.   On: 06/03/2019 18:26   CT Angio Abd/Pel W and/or Wo Contrast  Result Date: 06/03/2019 CLINICAL DATA:  Abdominal aneurysm noted on chest CT. EXAM: CTA ABDOMEN AND PELVIS WITHOUT AND WITH CONTRAST TECHNIQUE: Multidetector CT imaging of the abdomen and pelvis was performed using the standard protocol during bolus administration of intravenous contrast. Multiplanar reconstructed images and MIPs were obtained and reviewed to evaluate the vascular anatomy. CONTRAST:  59mL OMNIPAQUE IOHEXOL 350 MG/ML SOLN COMPARISON:  None. FINDINGS: VASCULAR Aorta: Large abdominal aortic aneurysm measures 7.1 x 5.4 cm.  Markedly tortuous aorta. The aorta tapers distally but remains aneurysmal above the bifurcation measuring 3.1 cm. Heavily calcified aorta. Celiac: Focal stenosis at the origin, greater than 50%.  Patent. SMA: Focal stenosis at the origin, less than 50%, patent Renals: Single renal arteries bilaterally. No definite significant stenosis. IMA: Patent Inflow: Heavily calcified, tortuous and ectatic iliofemoral vessels. No aneurysm. Proximal Outflow: Heavily calcified common femoral arteries and proximal superficial femoral arteries. Veins: No obvious venous abnormality within the limitations of this arterial phase study. Review of the MIP images confirms the above findings. NON-VASCULAR Lower chest: Cardiomegaly.  Moderate-sized hiatal hernia. Hepatobiliary: Small scattered hypodensities in the liver, likely cysts. No biliary ductal dilatation. Gallbladder unremarkable. Pancreas: No focal abnormality or ductal dilatation. Spleen: No focal abnormality.  Normal size. Adrenals/Urinary Tract: No renal or adrenal mass. No hydronephrosis. Urinary bladder unremarkable. Stomach/Bowel: Moderate stool burden. Sigmoid diverticulosis. No active diverticulitis. Stomach and small bowel grossly unremarkable. No bowel obstruction. Lymphatic: No adenopathy Reproductive: Uterus and adnexa unremarkable.  No mass. Other: No free fluid or free air. Musculoskeletal: No acute bony abnormality. IMPRESSION: Large abdominal aortic aneurysm measuring up to 7.1 cm. Extensive mural thrombus/plaque and calcifications. Aorta is markedly tortuous  and remains slightly aneurysmal at just above the aortic bifurcation at 3 cm. Vascular surgery consultation recommended due to increased risk of rupture for AAA >5.5 cm. This recommendation follows ACR consensus guidelines: White Paper of the ACR Incidental Findings Committee II on Vascular Findings. J Am Coll Radiol 2013; 10:789-794. Aortic aneurysm NOS (ICD10-I71.9) Heavily calcified aorta femoral vessels  without aneurysm. Moderate-sized hiatal hernia. Cardiomegaly. Sigmoid diverticulosis.  No active diverticulitis. Electronically Signed   By: Charlett Nose M.D.   On: 06/03/2019 18:57     Felicie Morn, NP 06/04/19 Karle Starch, MD 06/06/19 [phone]

## 2019-06-03 NOTE — ED Notes (Signed)
Patient ambulated to bathroom w/ a steady gate.  

## 2019-06-03 NOTE — Discharge Instructions (Signed)
Please follow-up with your primary care provider regarding today's visit. You will need additional evaluation of the lung nodule found on the CT of your chest. Also, please call Dr. Adele Dan office to schedule a follow-up appointment for the abdominal aneurysm.

## 2019-06-05 ENCOUNTER — Telehealth: Payer: Self-pay | Admitting: Cardiovascular Disease

## 2019-06-05 NOTE — Telephone Encounter (Signed)
Patient is requesting to speak to Dr. Gibson Ramp nurse in regards to an Abdominal Aortic Aneurysm Dx while patient was in the ED. Please call to discuss.

## 2019-06-05 NOTE — Telephone Encounter (Signed)
I reviewed the ED findings. Incidental finding of AAA on CTA. She has plans to see Dr. Edilia Bo on [facility] in 3 weeks. No need to see me before this appt. [name]

## 2019-06-05 NOTE — Telephone Encounter (Signed)
Spoke with the patient and she is pleased and in agreement with the plan.  She feels assured that she will continue to keep Dr. Clifton James as her cardiologist.

## 2019-06-09 ENCOUNTER — Telehealth: Payer: Self-pay | Admitting: Internal Medicine

## 2019-06-09 NOTE — Telephone Encounter (Addendum)
[facility] NOTE: Chief Complaint Chest Pain (non urgent symptoms) Reason for Call Symptomatic / Request for Health Information Initial Comment Caller states she is having chest soreness, discomfort and a little bit of shortness of breath because of the pain. She was in the emergency room last Tuesday, she has a pulled muscle in her chest wall or bruised rib. She was given some medication (anti-inflammatory- Baclosen 5mg ) for this but was only given 10 pills. She is wondering if she can get a refill if that is possible. Translation No Nurse Assessment Nurse: , RN, Candace Date/Time (Eastern Time): 06/09/2019 4:23:14 PM  Nurse advised caller to alternate Tylenol and Advil every 4 hours for pain. Verbalized understanding. Referrals REFERRED TO PCP OFFICE    Patient calling to report chest soreness and some shortness of breath.  Patient also requesting refill on baclofen 5 MG TABS prescribed from [facility] Due to symptoms patient verbalized she was warm transferred to [facility] at  [facility].

## 2019-06-10 ENCOUNTER — Other Ambulatory Visit: Payer: Self-pay | Admitting: Internal Medicine

## 2019-06-10 ENCOUNTER — Ambulatory Visit (INDEPENDENT_AMBULATORY_CARE_PROVIDER_SITE_OTHER): Payer: Medicare Other | Admitting: Internal Medicine

## 2019-06-10 ENCOUNTER — Encounter: Payer: Self-pay | Admitting: Internal Medicine

## 2019-06-10 DIAGNOSIS — I714 Abdominal aortic aneurysm, without rupture, unspecified: Secondary | ICD-10-CM | POA: Insufficient documentation

## 2019-06-10 DIAGNOSIS — K449 Diaphragmatic hernia without obstruction or gangrene: Secondary | ICD-10-CM | POA: Diagnosis not present

## 2019-06-10 DIAGNOSIS — I712 Thoracic aortic aneurysm, without rupture: Secondary | ICD-10-CM | POA: Diagnosis not present

## 2019-06-10 DIAGNOSIS — R0789 Other chest pain: Secondary | ICD-10-CM | POA: Diagnosis not present

## 2019-06-10 DIAGNOSIS — R911 Solitary pulmonary nodule: Secondary | ICD-10-CM

## 2019-06-10 DIAGNOSIS — I7121 Aneurysm of the ascending aorta, without rupture: Secondary | ICD-10-CM | POA: Insufficient documentation

## 2019-06-10 MED ORDER — BACLOFEN 5 MG PO TABS: 5.0000 mg | ORAL_TABLET | Freq: Two times a day (BID) | ORAL | 0 refills | Status: DC

## 2019-06-10 NOTE — Assessment & Plan Note (Signed)
Will be seen cardiothoracic surgery 3/3 for evaluation of this and aortic aneurysm

## 2019-06-10 NOTE — Assessment & Plan Note (Signed)
Seen cardiovascular surgery 3/3

## 2019-06-10 NOTE — Progress Notes (Signed)
Virtual Visit via telephone note  I connected with Jennifer [name] on 06/10/19 at 11:15 AM EST by telephone and verified that I am speaking with the correct person using two identifiers.   I discussed the limitations of evaluation and management by telemedicine and the availability of in person appointments. The patient expressed understanding and agreed to proceed.  Present for the visit:  Myself, Dr Cheryll Cockayne, Greggory Brandy.  The patient is currently at home and I am in the office.    No referring provider.    History of Present Illness: This is an acute visit for chest pain   She went to the emergency room on 2/9 for right-sided chest pain.  She states 1 night she was opening up a bottle of Coke and felt a twinge of pain in the chest, but the next day the pain was much worse.  It continued to get worse over the next few days.  It was worse with movement and she would experience some shortness of breath with the pain.  The shortness of breath got worse and that is what resulted her going to the emergency room for further evaluation.  Work-up included a chest x-ray, CT angio of the chest, CT Angie of the abdomen and pelvis, EKG and blood work.  Blood work was unremarkable, except an elevated D-dimer.  Troponin was negative.    She was found to have a dilated ascending thoracic aorta and a large abdominal aortic aneurysm.  She was also found to have a right upper lobe nodule.  She was prescribed baclofen 5 mg twice daily and diclofenac gel.  She is using the above.  She is also taking Tylenol every 6 hours.  She is continued to have significant pain it is difficult for her to get up without having severe pain.  She is trying to walk around a little.  Today was the first day that she felt like her pain was getting a little bit better.  She has pain at the right side of her sternum and it radiates around to under her right arm.  She is out of the baclofen and wanted to get a refill of it because  she thinks it may be helping, but is not sure.  She is also concerned because the pain has just gotten better today.    3/3 sees CTS.     Review of Systems  Constitutional: Negative for fever.  Respiratory: Negative for cough, shortness of breath and wheezing.   Cardiovascular: Positive for chest pain.     Social History   Socioeconomic History  . Marital status: Single    Spouse name: Not on file  . Number of children: Not on file  . Years of education: Not on file  . Highest education level: Not on file  Occupational History  . Occupation: retired  Tobacco Use  . Smoking status: Current Every Day Smoker    Packs/day: 0.50    Years: 62.00    Pack years: 31.00    Types: Cigarettes  . Smokeless tobacco: Never Used  Substance and Sexual Activity  . Alcohol use: Yes    Alcohol/week: 0.0 standard drinks    Comment: Social- 1 scotch, 1 glass wine weekly  . Drug use: No  . Sexual activity: Not Currently    Birth control/protection: Post-menopausal  Other Topics Concern  . Not on file  Social History Narrative   Single, lives alone. Retired Pensions consultant. Enjoys Agricultural consultant work.   Social Determinants  of Health   Financial Resource Strain: Low Risk   . Difficulty of Paying Living Expenses: Not hard at all  Food Insecurity: No Food Insecurity  . Worried About Programme researcher, broadcasting/film/video in the Last Year: Never true  . Ran Out of Food in the Last Year: Never true  Transportation Needs: No Transportation Needs  . Lack of Transportation (Medical): No  . Lack of Transportation (Non-Medical): No  Physical Activity: Sufficiently Active  . Days of Exercise per Week: 5 days  . Minutes of Exercise per Session: 30 min  Stress: No Stress Concern Present  . Feeling of Stress : Not at all  Social Connections: Unknown  . Frequency of Communication with Friends and Family: More than three times a week  . Frequency of Social Gatherings with Friends and Family: More than three times a  week  . Attends Religious Services: 1 to 4 times per year  . Active Member of Clubs or Organizations: Yes  . Attends Banker Meetings: More than 4 times per year  . Marital Status: Not on file     Observations/Objective:  CT Angio Abd/Pel W and/or Wo Contrast CLINICAL DATA:  Abdominal aneurysm noted on chest CT.  EXAM: CTA ABDOMEN AND PELVIS WITHOUT AND WITH CONTRAST  TECHNIQUE: Multidetector CT imaging of the abdomen and pelvis was performed using the standard protocol during bolus administration of intravenous contrast. Multiplanar reconstructed images and MIPs were obtained and reviewed to evaluate the vascular anatomy.  CONTRAST:  57mL OMNIPAQUE IOHEXOL 350 MG/ML SOLN  COMPARISON:  None.  FINDINGS: VASCULAR  Aorta: Large abdominal aortic aneurysm measures 7.1 x 5.4 cm. Markedly tortuous aorta. The aorta tapers distally but remains aneurysmal above the bifurcation measuring 3.1 cm. Heavily calcified aorta.  Celiac: Focal stenosis at the origin, greater than 50%.  Patent.  SMA: Focal stenosis at the origin, less than 50%, patent  Renals: Single renal arteries bilaterally. No definite significant stenosis.  IMA: Patent  Inflow: Heavily calcified, tortuous and ectatic iliofemoral vessels. No aneurysm.  Proximal Outflow: Heavily calcified common femoral arteries and proximal superficial femoral arteries.  Veins: No obvious venous abnormality within the limitations of this arterial phase study.  Review of the MIP images confirms the above findings.  NON-VASCULAR  Lower chest: Cardiomegaly.  Moderate-sized hiatal hernia.  Hepatobiliary: Small scattered hypodensities in the liver, likely cysts. No biliary ductal dilatation. Gallbladder unremarkable.  Pancreas: No focal abnormality or ductal dilatation.  Spleen: No focal abnormality.  Normal size.  Adrenals/Urinary Tract: No renal or adrenal mass. No hydronephrosis. Urinary bladder  unremarkable.  Stomach/Bowel: Moderate stool burden. Sigmoid diverticulosis. No active diverticulitis. Stomach and small bowel grossly unremarkable. No bowel obstruction.  Lymphatic: No adenopathy  Reproductive: Uterus and adnexa unremarkable.  No mass.  Other: No free fluid or free air.  Musculoskeletal: No acute bony abnormality.  IMPRESSION: Large abdominal aortic aneurysm measuring up to 7.1 cm. Extensive mural thrombus/plaque and calcifications. Aorta is markedly tortuous and remains slightly aneurysmal at just above the aortic bifurcation at 3 cm. Vascular surgery consultation recommended due to increased risk of rupture for AAA >5.5 cm. This recommendation follows ACR consensus guidelines: White Paper of the ACR Incidental Findings Committee II on Vascular Findings. J Am Coll Radiol 2013; 10:789-794. Aortic aneurysm NOS (ICD10-I71.9)  Heavily calcified aorta femoral vessels without aneurysm.  Moderate-sized hiatal hernia.  Cardiomegaly.  Sigmoid diverticulosis.  No active diverticulitis.  Electronically Signed   By: Charlett Nose M.D.   On: 06/03/2019 18:57 CT Angio  Chest PE W and/or Wo Contrast CLINICAL DATA:  79 year old female with shortness of breath.  EXAM: CT ANGIOGRAPHY CHEST WITH CONTRAST  TECHNIQUE: Multidetector CT imaging of the chest was performed using the standard protocol during bolus administration of intravenous contrast. Multiplanar CT image reconstructions and MIPs were obtained to evaluate the vascular anatomy.  CONTRAST:  40mL OMNIPAQUE IOHEXOL 350 MG/ML SOLN  COMPARISON:  Chest radiograph dated 06/03/2019.  FINDINGS: Cardiovascular: There is mild cardiomegaly. Advanced coronary vascular calcification. There is retrograde flow of contrast from the right atrium into the IVC in keeping with a degree of right heart dysfunction. There is aneurysmal dilatation of the ascending aorta measuring up to 4.3 cm. There is advanced  atherosclerotic calcification of the aorta. There is mild dilatation of the descending thoracic aorta measuring up to 3.3 cm in diameter. Evaluation of the aorta is very limited due to suboptimal opacification and timing of the contrast. No pulmonary artery embolus identified.  Mediastinum/Nodes: There is no hilar or mediastinal adenopathy. There is a large hiatal hernia. No mediastinal fluid collection.  Lungs/Pleura: Emphysema. There is a 6 mm right upper lobe nodule (series 7, image 46) with somewhat irregular or spiculated margins concerning for malignancy. No consolidative changes. There is no pleural effusion or pneumothorax. The central airways are patent.  Upper Abdomen: Partially visualized 6.7 cm aneurysm of the abdominal aorta. CT angiography of the abdomen and pelvis has been ordered and in progress. There is a 4 mm right renal upper pole stone. A 1 cm hypodense lesion in the anterior liver.  Musculoskeletal: Degenerative changes of the spine. No acute osseous pathology.  Review of the MIP images confirms the above findings.  IMPRESSION: 1. No CT evidence of pulmonary embolism. 2. Cardiomegaly with coronary vascular calcification. 3. Advanced Aortic Atherosclerosis (ICD10-I70.0) and Emphysema (ICD10-J43.9). 4. A 4.3 cm aneurysmal dilatation of the ascending thoracic aorta. Recommend annual imaging followup by CTA or MRA. This recommendation follows 2010 ACCF/AHA/AATS/ACR/ASA/SCA/SCAI/SIR/STS/SVM Guidelines for the Diagnosis and Management of Patients with Thoracic Aortic Disease. Circulation. 2010; 121: Z610-R604. Aortic aneurysm NOS (ICD10-I71.9) 5. A 6 mm right upper lobe nodule with irregular or spiculated margins concerning for malignancy. Clinical correlation and thoracic consult is advised. 6. Partially visualized 6.7 cm abdominal aortic aneurysm.  Electronically Signed   By: Anner Crete M.D.   On: 06/03/2019 18:26 DG Chest 2 View CLINICAL DATA:   Chest pain on the right  EXAM: CHEST - 2 VIEW  COMPARISON:  05/11/2011  FINDINGS: Cardiac shadows within normal limits. Aortic calcifications are again seen. Hiatal hernia is noted. The lungs are hyperinflated consistent with COPD. No acute bony abnormality is noted. Degenerative changes of the thoracic spine are seen.  IMPRESSION: Hiatal hernia.  No acute abnormality seen.  Electronically Signed   By: Inez Catalina M.D.   On: 06/03/2019 13:09   Lab Results  Component Value Date   WBC 6.3 06/03/2019   HGB 11.6 (L) 06/03/2019   HCT 35.4 (L) 06/03/2019   PLT 268 06/03/2019   GLUCOSE 102 (H) 06/03/2019   CHOL 189 05/07/2019   TRIG 78.0 05/07/2019   HDL 69.30 05/07/2019   LDLDIRECT 232.1 08/09/2009   LDLCALC 104 (H) 05/07/2019   ALT 8 05/07/2019   AST 20 05/07/2019   NA 132 (L) 06/03/2019   K 4.3 06/03/2019   CL 97 (L) 06/03/2019   CREATININE 0.99 06/03/2019   BUN 18 06/03/2019   CO2 24 06/03/2019   TSH 3.63 11/01/2018   HGBA1C 6.0 05/07/2019  Assessment and Plan:  See Problem List for Assessment and Plan of chronic medical problems.   Follow Up Instructions:    I discussed the assessment and treatment plan with the patient. The patient was provided an opportunity to ask questions and all were answered. The patient agreed with the plan and demonstrated an understanding of the instructions.   The patient was advised to call back or seek an in-person evaluation if the symptoms worsen or if the condition fails to improve as anticipated.  Time spent on telephone call: 15 minutes  Pincus Sanes, MD

## 2019-06-10 NOTE — Assessment & Plan Note (Signed)
Sees CTS 3/3

## 2019-06-10 NOTE — Telephone Encounter (Signed)
Baclofen sent

## 2019-06-10 NOTE — Assessment & Plan Note (Signed)
05/2019-evaluation in the ED did not reveal cardiac cause and chest pain is likely musculoskeletal in nature Chest pain is finally improving Continue baclofen, can take 2 pills up to 3 times a day as long as it does not make her drowsy Continue Tylenol Continue topical diclofenac gel She will let me know if she does not see continued improvement

## 2019-06-10 NOTE — Telephone Encounter (Signed)
Ok to refill baclofen? Has an appointment with you at 11:15 today for sxs?

## 2019-06-10 NOTE — Telephone Encounter (Signed)
Ok to fill 

## 2019-06-24 ENCOUNTER — Ambulatory Visit: Payer: Medicare Other

## 2019-06-24 ENCOUNTER — Ambulatory Visit: Payer: Medicare Other | Attending: Internal Medicine

## 2019-06-24 DIAGNOSIS — Z23 Encounter for immunization: Secondary | ICD-10-CM | POA: Insufficient documentation

## 2019-06-24 NOTE — Progress Notes (Signed)
   Covid-19 Vaccination Clinic  Name:  Jennifer [name]    MRN: [id number] DOB: 1940-08-02  06/24/2019  Ms. Hardcastle was observed post Covid-19 immunization for 15 minutes without incident. She was provided with Vaccine Information Sheet and instruction to access the V-Safe system.   Ms. Pamer was instructed to call 911 with any severe reactions post vaccine: Marland Kitchen Difficulty breathing  . Swelling of face and throat  . A fast heartbeat  . A bad rash all over body  . Dizziness and weakness   Immunizations Administered    Name Date Dose VIS Date Route   Pfizer COVID-19 Vaccine 06/24/2019  3:37 PM 0.3 mL 04/04/2019 Intramuscular   Manufacturer: [employer], [employer]   Lot: [id number]   NDC: [address]-3

## 2019-06-25 ENCOUNTER — Ambulatory Visit (INDEPENDENT_AMBULATORY_CARE_PROVIDER_SITE_OTHER): Payer: Medicare Other | Admitting: Vascular Surgery

## 2019-06-25 ENCOUNTER — Other Ambulatory Visit: Payer: Self-pay

## 2019-06-25 ENCOUNTER — Encounter: Payer: Self-pay | Admitting: Vascular Surgery

## 2019-06-25 VITALS — BP 128/68 | HR 92 | Temp 97.7°F | Resp 20 | Ht 64.0 in | Wt 92.0 lb

## 2019-06-25 DIAGNOSIS — I714 Abdominal aortic aneurysm, without rupture, unspecified: Secondary | ICD-10-CM

## 2019-06-25 NOTE — Progress Notes (Signed)
REASON FOR CONSULT:    Abdominal aortic aneurysm.  The consult is requested by the [facility] emergency department.  ASSESSMENT & PLAN:   5.4 CM INFRARENAL ABDOMINAL AORTIC ANEURYSM: Based on my review of the CT angiogram, this patient has a 5.4 cm infrarenal abdominal aortic aneurysm.  This was read as larger but this did not account for the tortuosity of the artery.  The patient has marked tortuosity at the neck of the aneurysm and also in the iliac arteries.  In addition there is marked arterial calcification especially in the iliac vessels and even down into the common femoral vessels.  I think based on my review of the CT angiogram, the patient is clearly not a candidate for endovascular aneurysm repair or a fenestrated graft.  The only option for repair would be a open abdominal aortic aneurysm repair.  There are several issues which complicate her situation.  I am most concerned about the degree of calcification of her arteries which will make clamping and sewing the graft technically challenging.  She would most likely require an aortofemoral bypass graft and possible endarterectomy in order to find a place to sew at the level of the groins.  It does look like I could clamp below the renals.  I explained that although we would normally consider elective repair at 5.5 cm given that she is 5.4 and given that she is so small I think she is at significant increased risk for rupture if this is not addressed.  She would like to consider this further and in the meantime we will arrange for her to see Dr. Clifton James for preoperative cardiac evaluation.  I have discussed with her the importance of tobacco cessation.  She understands that poorly controlled blood pressure and continued tobacco use increase her risk of aneurysm expansion and rupture.  Jennifer Ferrari, MD Office: [phone]   HPI:   Jennifer [name] is a pleasant 79 y.o. female, who had presented with chest pain in February of  this year to the [facility] emergency department.  She also had significant shortness of breath.  She is fairly frail 79 year old and was opening a bottle and pulled a muscle in her chest.  This pain worsened and she had a hard time breathing.  In the emergency department she went underwent extensive work-up including chest CT which showed no evidence of pulmonary embolus.  She also had an abdominal CT as part of the dissection work-up and this showed as an incidental finding an abdominal aortic aneurysm and marked calcific disease of her aorta and iliac arteries.  There was no evidence of aneurysmal leak and she sent for vascular consultation.  There is no family history of aneurysmal disease.  There is no family history of premature cardiovascular disease.  She is followed by Dr. Clifton James with peripheral vascular disease.  She has a known 40 to 59% left carotid stenosis with no significant stenosis on the right.  She has peripheral vascular disease also.  She describes some mild claudication although she is on cilostazol and recently has not had significant symptoms although I think her activity is somewhat limited.  She denies any history of rest pain or nonhealing ulcers.  She denies any history of stroke, TIAs, expressive or receptive aphasia, or amaurosis fugax.  She denies any previous history of myocardial infarction or history of congestive heart failure.  Her chest pain that she had in February has resolved.  She does have chronic low back pain.  Past  Medical History:  Diagnosis Date  . ANEMIA-NOS   . Cataract    right cataract removed  . Clotting disorder (HCC)    Pletal  . Depression    no longer taking meds/ resolved  . DYSLIPIDEMIA   . GERD (gastroesophageal reflux disease)   . Hemorrhoids   . HYPERTENSION   . Negative colorectal cancer screening using Cologuard test   . Osteoporosis, senile    DEXA 03/2014: -3.3 R fem  . Peripheral vascular disease, unspecified ([place]) dx  02/2010 ABI  . SMOKER     Family History  Problem Relation Age of Onset  . Breast cancer Mother   . Lung cancer Father   . Hypertension Brother   . Colon cancer Neg Hx   . Esophageal cancer Neg Hx   . Stomach cancer Neg Hx   . Rectal cancer Neg Hx     SOCIAL HISTORY: She smokes half a pack per day and has been smoking for 62 years.  She is single.  She does not have any children.  Her sister lives in [place] and she would like to bring her to her next appointment. Social History   Socioeconomic History  . Marital status: Single    Spouse name: Not on file  . Number of children: Not on file  . Years of education: Not on file  . Highest education level: Not on file  Occupational History  . Occupation: retired  Tobacco Use  . Smoking status: Current Every Day Smoker    Packs/day: 0.50    Years: 62.00    Pack years: 31.00    Types: Cigarettes  . Smokeless tobacco: Never Used  Substance and Sexual Activity  . Alcohol use: Yes    Alcohol/week: 0.0 standard drinks    Comment: Social- 1 scotch, 1 glass wine weekly  . Drug use: No  . Sexual activity: Not Currently    Birth control/protection: Post-menopausal  Other Topics Concern  . 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   Social Determinants of Health   Financial Resource Strain: Low Risk   . Difficulty of Paying Living Expenses: Not hard at all  Food Insecurity: No Food Insecurity  . Worried About Charity fundraiser in the Last Year: Never true  . Ran Out of Food in the Last Year: Never true  Transportation Needs: No Transportation Needs  . Lack of Transportation (Medical): No  . Lack of Transportation (Non-Medical): No  Physical Activity: Sufficiently Active  . Days of Exercise per Week: 5 days  . Minutes of Exercise per Session: 30 min  Stress: No Stress Concern Present  . Feeling of Stress : Not at all  Social Connections: Unknown  .  Frequency of Communication with Friends and Family: More than three times a week  . Frequency of Social Gatherings with Friends and Family: More than three times a week  . Attends Religious Services: 1 to 4 times per year  . Active Member of Clubs or Organizations: Yes  . Attends Archivist Meetings: More than 4 times per year  . Marital Status: Not on file  Intimate Partner Violence:   . Fear of Current or Ex-Partner: Not on file  . Emotionally Abused: Not on file  . Physically Abused: Not on file  . Sexually Abused: Not on file    No Known Allergies  Current Outpatient Medications  Medication Sig Dispense Refill  . ALPRAZolam Duanne Moron)  0.25 MG tablet TAKE 1 TABLET BY MOUTH EVERY NIGHT AT BEDTIME AS NEEDED FOR ANXIETY OR SLEEP (Patient taking differently: Take 0.25 mg by mouth at bedtime as needed for anxiety or sleep. ) 30 tablet 2  . amLODipine (NORVASC) 5 MG tablet TAKE 1 TABLET BY MOUTH EVERY NIGHT AT BEDTIME (Patient taking differently: Take 5 mg by mouth daily. ) 90 tablet 1  . aspirin 81 MG tablet Take 81 mg by mouth daily before breakfast.     . atorvastatin (LIPITOR) 20 MG tablet TAKE 1 TABLET BY MOUTH EVERY DAY (Patient taking differently: Take 20 mg by mouth daily at 6 PM. ) 90 tablet 1  . calcium-vitamin D (OSCAL WITH D) 500-200 MG-UNIT per tablet Take 2 tablets by mouth daily.    . cilostazol (PLETAL) 50 MG tablet Take 1 tablet (50 mg total) by mouth 2 (two) times daily. 180 tablet 1  . Diclofenac Sodium 1 % CREA Apply 1 application topically 2 (two) times daily. 120 g 0  . omeprazole (PRILOSEC) 40 MG capsule Take 1 capsule (40 mg total) by mouth daily. Patient needs office visit for further refills 90 capsule 0  . potassium chloride SA (K-DUR) 20 MEQ tablet TAKE 1 TABLET(20 MEQ) BY MOUTH DAILY (Patient taking differently: Take 20 mEq by mouth daily. ) 90 tablet 3   No current facility-administered medications for this visit.    REVIEW OF SYSTEMS:  [X]  denotes  positive finding, [ ]  denotes negative finding Cardiac  Comments:  Chest pain or chest pressure:    Shortness of breath upon exertion:    Short of breath when lying flat:    Irregular heart rhythm:        Vascular    Pain in calf, thigh, or hip brought on by ambulation: x   Pain in feet at night that wakes you up from your sleep:     Blood clot in your veins:    Leg swelling:         Pulmonary    Oxygen at home:    Productive cough:     Wheezing:         Neurologic    Sudden weakness in arms or legs:     Sudden numbness in arms or legs:     Sudden onset of difficulty speaking or slurred speech:    Temporary loss of vision in one eye:     Problems with dizziness:         Gastrointestinal    Blood in stool:     Vomited blood:         Genitourinary    Burning when urinating:     Blood in urine:        Psychiatric    Major depression:         Hematologic    Bleeding problems:    Problems with blood clotting too easily:        Skin    Rashes or ulcers:        Constitutional    Fever or chills:     PHYSICAL EXAM:   Vitals:   06/25/19 1107  BP: 128/68  Pulse: 92  Resp: 20  Temp: 97.7 F (36.5 C)  SpO2: 97%  Weight: 92 lb (41.7 kg)  Height: 5\' 4"  (1.626 m)    GENERAL: The patient is a well-nourished female, in no acute distress. The vital signs are documented above. CARDIAC: There is a regular rate and rhythm.  VASCULAR: I do  not detect carotid bruits. She has palpable femoral pulses. On the right side she has a diminished but palpable posterior tibial and dorsalis pedis pulse. On the left side I cannot palpate a dorsalis pedis or posterior tibial pulse.  Both feet are warm and well-perfused. PULMONARY: There is good air exchange bilaterally without wheezing or rales. ABDOMEN: Soft and non-tender with normal pitched bowel sounds.  MUSCULOSKELETAL: There are no major deformities or cyanosis. NEUROLOGIC: No focal weakness or paresthesias are detected. SKIN:  There are no ulcers or rashes noted. PSYCHIATRIC: The patient has a normal affect.  DATA:    CT CHEST: This shows that the maximum diameter of her ascending aortic arch is 4.3 cm.  She also was noted to have a lung nodule in the emergency department notified the patient's primary care physician of this.  CT ABDOMEN PELVIS: When I measure her aneurysm, accounting for the tortuosity of her aorta, I measure 5.4 cm in maximum diameter.  She has significant calcific disease and marked tortuosity of her iliac vessels bilaterally.  LABS: Her GFR is 54.  Creatinine is 0.99.

## 2019-06-26 ENCOUNTER — Encounter: Payer: Self-pay | Admitting: Cardiology

## 2019-06-26 ENCOUNTER — Ambulatory Visit: Payer: Medicare Other | Admitting: Cardiology

## 2019-06-26 VITALS — BP 140/86 | HR 92 | Ht 64.0 in | Wt 93.0 lb

## 2019-06-26 DIAGNOSIS — I351 Nonrheumatic aortic (valve) insufficiency: Secondary | ICD-10-CM

## 2019-06-26 DIAGNOSIS — I714 Abdominal aortic aneurysm, without rupture, unspecified: Secondary | ICD-10-CM

## 2019-06-26 DIAGNOSIS — Z01810 Encounter for preprocedural cardiovascular examination: Secondary | ICD-10-CM | POA: Diagnosis not present

## 2019-06-26 DIAGNOSIS — Z01818 Encounter for other preprocedural examination: Secondary | ICD-10-CM

## 2019-06-26 DIAGNOSIS — I739 Peripheral vascular disease, unspecified: Secondary | ICD-10-CM | POA: Diagnosis not present

## 2019-06-26 DIAGNOSIS — I10 Essential (primary) hypertension: Secondary | ICD-10-CM

## 2019-06-26 NOTE — Progress Notes (Signed)
Cardiology Office Note   Date:  06/26/2019   ID:  Jennifer [name] [name], DOB 12-15-1940, MRN [id number]  PCP:  Binnie Rail, MD  Cardiologist: Dr. Angelena Form, MD  Chief Complaint  Patient presents with  . Pre-op Exam    History of Present Illness: Jennifer [name] [name] is a 79 y.o. female who presents for preoperative cardiac clearance, seen for Dr. Angelena Form.  Jennifer [name] [name] has a history of PAD, tobacco abuse, HTN, aortic valve insufficiency and hyperlipidemia who is here today for PV follow up.  She was initially seen 02/2010 by Dr. Angelena Form for the evaluation of PAD. Initial non-invasive studies showed chronic total occlusion of the left SFA with a long segment of total occlusion, reconstitution above the knee. There was moderate disease in the right SFA. Her initial ABI in 2012 was moderately reduced at 0.76 on the right and 0.60 on the left.  Recommendations were for a diagnostic angiogram however she deferred. Over the last 10 years, her PAD was noted to have been fairly  stable. Most recent non-invasive studies June 2020 with right ABI 0.84 and left ABI 0.69. Carotid artery dopplers June 6203 with 55-97% LICA, 4-16% RICA stenosis, unchanged. She has been on ASA and Pletal. Echo September 2018 with normal LV systolic function, LAGT=36-46%. Grade 1 diastolic dysfunction. Mild AI.   Unfortunately, she was seen in the emergency department at which time a CTA was performed with an incidental finding of a 5.4cm AAA with plans to follow with Dr. Scot Dock given its size.  She was seen by Dr. Scot Dock 06/25/2019 to discuss options. Case determined to be more complicated than anticipated given marked arterial calcifications in the iliac and common femoral vessels felt not to be a candidate for endovascular aneurysm repair or fenestrated graft.  Only option per Dr. Nicole Cella note is for open abdominal aortic aneurysm repair.  Complicating her issue, he was concerned about the degree of calcification in her  arteries making clamping and sewing the graft technically challenging>> felt that she would most likely require an aortofemoral bypass graft and possible endarterectomy in order to find a place to sew at the level of the groins. He felt she was at high risk for rupture given her stature at 71'4. He recommended CV clearance prior ot surgery.   She was last seen by Dr. Angelena Form 01/27/2019 and was doing well from a CV standpoint. Today she presents for preoperative assessment for open abdominal aortic aneurysm repair per Dr. Scot Dock. She does not have any unstable cardiac conditions. Upon evaluation today, she can achieve 4 METs or greater without anginal symptoms. She walks her dog 3 times per day and cleans her house without complications. She denies shortness of breath, orthopnea or other symptoms of fluid volume overload. She has no known coronary artery disease however has peripheral arterial disease as well as carotid artery disease putting her at further risk for CAD. Her revised cardiac risk index of peri-procedural MI or cardiac arrest following surgery is low at 0.9% however given the high risk of surgical procedure with known multivascular abnormalities will proceed with repeating her echocardiogram to reassess aortic insufficiency and plan for further cardiac work-up with either stress testing versus cardiac catheterization.  Will discuss case with Dr. Angelena Form, the patient's primary cardiologist and follow.  Past Medical History:  Diagnosis Date  . ANEMIA-NOS   . Cataract    right cataract removed  . Clotting disorder (HCC)    Pletal  . Depression  no longer taking meds/ resolved  . DYSLIPIDEMIA   . GERD (gastroesophageal reflux disease)   . Hemorrhoids   . HYPERTENSION   . Negative colorectal cancer screening using Cologuard test   . Osteoporosis, senile    DEXA 03/2014: -3.3 R fem  . Peripheral vascular disease, unspecified (HCC) dx 02/2010 ABI  . SMOKER     Past Surgical History:   Procedure Laterality Date  . CATARACT EXTRACTION W/ INTRAOCULAR LENS IMPLANT Right 03/12/14   groat  . HEMORRHOID SURGERY  05/18/2011   Procedure: HEMORRHOIDECTOMY;  Surgeon: Robyne Askew, MD;  Location: [facility];  Service: General;  Laterality: N/A;  hemorrhoidectomy, three columns, internal and external  . TONSILLECTOMY  1946  . TUBAL LIGATION  1979  . UPPER GASTROINTESTINAL ENDOSCOPY  01/03/2018     Current Outpatient Medications  Medication Sig Dispense Refill  . ALPRAZolam (XANAX) 0.25 MG tablet TAKE 1 TABLET BY MOUTH EVERY NIGHT AT BEDTIME AS NEEDED FOR ANXIETY OR SLEEP (Patient taking differently: Take 0.25 mg by mouth at bedtime as needed for anxiety or sleep. ) 30 tablet 2  . amLODipine (NORVASC) 5 MG tablet TAKE 1 TABLET BY MOUTH EVERY NIGHT AT BEDTIME (Patient taking differently: Take 5 mg by mouth daily. ) 90 tablet 1  . aspirin 81 MG tablet Take 81 mg by mouth daily before breakfast.     . atorvastatin (LIPITOR) 20 MG tablet TAKE 1 TABLET BY MOUTH EVERY DAY (Patient taking differently: Take 20 mg by mouth daily at 6 PM. ) 90 tablet 1  . calcium-vitamin D (OSCAL WITH D) 500-200 MG-UNIT per tablet Take 2 tablets by mouth daily.    . cilostazol (PLETAL) 50 MG tablet Take 1 tablet (50 mg total) by mouth 2 (two) times daily. 180 tablet 1  . Diclofenac Sodium 1 % CREA Apply 1 application topically 2 (two) times daily. 120 g 0  . omeprazole (PRILOSEC) 40 MG capsule Take 1 capsule (40 mg total) by mouth daily. Patient needs office visit for further refills 90 capsule 0  . potassium chloride SA (K-DUR) 20 MEQ tablet TAKE 1 TABLET(20 MEQ) BY MOUTH DAILY (Patient taking differently: Take 20 mEq by mouth daily. ) 90 tablet 3   No current facility-administered medications for this visit.    Allergies:   Patient has no known allergies.    Social History:  The patient  reports that she has been smoking cigarettes. She has a 31.00 pack-year smoking history. She has never used smokeless  tobacco. She reports current alcohol use. She reports that she does not use drugs.   Family History:  The patient's family history includes Breast cancer in her mother; Hypertension in her brother; Lung cancer in her father.    ROS:  Please see the history of present illness. Otherwise, review of systems are positive for none.   All other systems are reviewed and negative.    PHYSICAL EXAM: VS:  BP 140/86   Pulse 92   Ht 5\' 4"  (1.626 m)   Wt 93 lb (42.2 kg)   BMI 15.96 kg/m  , BMI Body mass index is 15.96 kg/m.   General: Frail, NAD Neck: Negative for carotid bruits. No JVD Lungs:Clear to ausculation bilaterally. No wheezes, rales, or rhonchi. Breathing is unlabored. Cardiovascular: RRR with S1 S2. No murmur Extremities: No edema. Radial pulses 2+ bilaterally Neuro: Alert and oriented. No focal deficits. No facial asymmetry. MAE spontaneously. Psych: Responds to questions appropriately with normal affect.    EKG:  EKG is ordered today. The ekg ordered today demonstrates NSR with wandering atrial pacemaker, frequent PACs and 1 PVC, HR 92 bpm   Recent Labs: 11/01/2018: TSH 3.63 05/07/2019: ALT 8 06/03/2019: BUN 18; Creatinine, Ser 0.99; Hemoglobin 11.6; Platelets 268; Potassium 4.3; Sodium 132    Lipid Panel    Component Value Date/Time   CHOL 189 05/07/2019 1453   TRIG 78.0 05/07/2019 1453   HDL 69.30 05/07/2019 1453   CHOLHDL 3 05/07/2019 1453   VLDL 15.6 05/07/2019 1453   LDLCALC 104 (H) 05/07/2019 1453   LDLDIRECT 232.1 08/09/2009 1125     Wt Readings from Last 3 Encounters:  06/26/19 93 lb (42.2 kg)  06/25/19 92 lb (41.7 kg)  05/07/19 99 lb 9.6 oz (45.2 kg)     Other studies Reviewed: Additional studies/ records that were reviewed today include:   Echocardiogram 01/03/2017:  Study Conclusions   - Left ventricle: There was mild focal basal hypertrophy of the  septum. Systolic function was normal. The estimated ejection  fraction was in the range of 55%  to 60%. Doppler parameters are  consistent with abnormal left ventricular relaxation (grade 1  diastolic dysfunction).  - Aortic valve: There was mild regurgitation.   ASSESSMENT AND PLAN:  1.  Preoperative assessment for open abdominal aortic aneurysm: -Patient was last seen by Dr. Clifton James 01/27/2019 and was doing well from a CV standpoint. Today she presents for preoperative assessment for open abdominal aortic aneurysm repair per Dr. Edilia Bo. She does not have any unstable cardiac conditions. Upon evaluation today, she can achieve 4 METs or greater without anginal symptoms. She walks her dog 3 times per day and cleans her house without complications. She denies shortness of breath, orthopnea or other symptoms of fluid volume overload. She has no known coronary artery disease however has peripheral arterial disease as well as carotid artery disease putting her at further risk for CAD. Her revised cardiac risk index of peri-procedural MI or cardiac arrest following surgery is low at 0.9% however given the high risk of surgical procedure with known multivascular abnormalities will proceed with repeating her echocardiogram to reassess aortic insufficiency and plan for further cardiac work-up with either stress testing versus cardiac catheterization.  Will discuss case with Dr. Clifton James, the patient's primary cardiologist and follow.  2. PAD:   -Bilateral lower extremity Doppler studies with ABI overall stable  -Denies claudication symptoms  -Continue to follow conservatively  -Continue ASA and Pletal.  -Repeat dopplers June 2021   3. Carotid artery disease:  -Moderate LICA stenosis and mild RICA stenosis by dopplers 09/2018 with plans to repeat 09/2019  4. Tobacco abuse:  -Smoking cessation is strongly encouraged as tobacco use increases risk of aneurysm expansion and possible rupture.  5. Aortic valve insufficiency: -Mild aortic insufficiency per echocardiogram 12/2016 with plans for repeat  echocardiogram 12/2019 however will go ahead and obtain given request for cardiac clearance for open abdominal aortic aneurysm repair.  -No AI symptoms   Current medicines are reviewed at length with the patient today.  The patient does not have concerns regarding medicines.  The following changes have been made:  no change  Labs/ tests ordered today include: Echocardiogram No orders of the defined types were placed in this encounter.    Disposition:   FU with Dr. Clifton James in 6 weeks  Signed, Georgie Chard, NP  06/26/2019 3:32 PM    [facility] [address] [place], [place], [place]  [address] Phone: [phone]; Fax: [phone]

## 2019-06-26 NOTE — Patient Instructions (Signed)
Medication Instructions:   Your physician recommends that you continue on your current medications as directed. Please refer to the Current Medication list given to you today.  *If you need a refill on your cardiac medications before your next appointment, please call your pharmacy*   Lab Work:  None ordered today  If you have labs (blood work) drawn today and your tests are completely normal, you will receive your results only by: Marland Kitchen MyChart Message (if you have MyChart) OR . A paper copy in the mail If you have any lab test that is abnormal or we need to change your treatment, we will call you to review the results.  Testing/Procedures:  Your physician has requested that you have an echocardiogram. Echocardiography is a painless test that uses sound waves to create images of your heart. It provides your doctor with information about the size and shape of your heart and how well your heart's chambers and valves are working. This procedure takes approximately one hour. There are no restrictions for this procedure.  Follow-Up: At [facility], you and your health needs are our priority.  As part of our continuing mission to provide you with exceptional heart care, we have created designated Provider Care Teams.  These Care Teams include your primary Cardiologist (physician) and Advanced Practice Providers (APPs -  Physician Assistants and Nurse Practitioners) who all work together to provide you with the care you need, when you need it.  We recommend signing up for the patient portal called "MyChart".  Sign up information is provided on this After Visit Summary.  MyChart is used to connect with patients for Virtual Visits (Telemedicine).  Patients are able to view lab/test results, encounter notes, upcoming appointments, etc.  Non-urgent messages can be sent to your provider as well.   To learn more about what you can do with MyChart, go to [email or web address].    Your next appointment:    6 month(s)  The format for your next appointment:   In Person  Provider:   You may see Verne Carrow, MD or one of the following Advanced Practice Providers on your designated Care Team:    Ronie Spies, PA-C  Jacolyn Reedy, PA-C

## 2019-06-27 ENCOUNTER — Telehealth: Payer: Self-pay

## 2019-06-27 ENCOUNTER — Encounter: Payer: Self-pay | Admitting: Internal Medicine

## 2019-06-27 ENCOUNTER — Ambulatory Visit (INDEPENDENT_AMBULATORY_CARE_PROVIDER_SITE_OTHER): Payer: Medicare Other | Admitting: Internal Medicine

## 2019-06-27 DIAGNOSIS — M5416 Radiculopathy, lumbar region: Secondary | ICD-10-CM

## 2019-06-27 MED ORDER — GABAPENTIN 100 MG PO CAPS: 100.0000 mg | ORAL_CAPSULE | Freq: Every day | ORAL | 3 refills | Status: DC

## 2019-06-27 MED ORDER — HYDROCODONE-ACETAMINOPHEN 7.5-325 MG PO TABS: 1.0000 | ORAL_TABLET | ORAL | 0 refills | Status: DC | PRN

## 2019-06-27 NOTE — Telephone Encounter (Signed)
Patient states she is having severe lower back pain. States that the pain is so severe that she feels like she could throw up. I did advise the ED or UC which patient refused. Would like a referral to ortho or wherever you feel is appropriate and something sent in for pain. I put her down for a phone call with you.

## 2019-06-27 NOTE — Assessment & Plan Note (Signed)
Symptoms consistent with lumbar radiculopathy Discussed that she likely has a pinched nerve Discussed options for treatment, specifically gabapentin and hydrocodone-acetaminophen, which she has had in the past Discussed that we can increase or decrease these medications depending on how she does with that to help keep her pain controlled Discussed seeing an orthopedic for further evaluation and treatment-she is slightly overwhelmed at this point because she has multiple appointments and is likely going to need to have surgery for her aneurysm She will update me next week so that we can adjust medications or start the referral Avoid positions that cause pain, no lifting, bending or twisting

## 2019-06-27 NOTE — Telephone Encounter (Signed)
New message    The patient calls need advice on something asking Dr. Lawerance Bach to call her back   No detail is given.

## 2019-06-27 NOTE — Progress Notes (Signed)
Virtual Visit via telephone note  I connected with Jennifer [name] on 06/27/19 at  3:45 PM EST by telephone and verified that I am speaking with the correct person using two identifiers.   I discussed the limitations of evaluation and management by telemedicine and the availability of in person appointments. The patient expressed understanding and agreed to proceed.  Present for the visit:  Myself, Dr Billey Gosling, Ernst Spell.  The patient is currently at home and I am in the office.    No referring provider.    History of Present Illness: This is an acute visit for back pain.  Her back started hurting after she was in the ED 2/9.  When dealing with the chest pain she was laying around more and developed back pain.  There is no injury.  It feels like she can't walk.  It is an effort to get in and out of the car.  The pain is in the lower back and centered on her hip.  The pain radiates down her leg, but the pain gets better the further it goes down the leg.  She denies any numbness or tingling.  The leg does feel weak.  She is needing help at home.  She is not able to walk her dogs.  She is having family take her to her doctor's appointments.  She really needs something for the pain.  She has never had pain like this before and it is severe.  She states she can get in a certain position the pain will go away, but moving often brings it back on.  She denies muscle spasms.  She denies fevers, chills, change in urination, bowel movements.   Social History   Socioeconomic History  . Marital status: Single    Spouse name: Not on file  . Number of children: Not on file  . Years of education: Not on file  . Highest education level: Not on file  Occupational History  . Occupation: retired  Tobacco Use  . Smoking status: Current Every Day Smoker    Packs/day: 0.50    Years: 62.00    Pack years: 31.00    Types: Cigarettes  . Smokeless tobacco: Never Used  Substance and Sexual Activity   . Alcohol use: Yes    Alcohol/week: 0.0 standard drinks    Comment: Social- 1 scotch, 1 glass wine weekly  . Drug use: No  . Sexual activity: Not Currently    Birth control/protection: Post-menopausal  Other Topics Concern  . 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   Social Determinants of Health   Financial Resource Strain: Low Risk   . Difficulty of Paying Living Expenses: Not hard at all  Food Insecurity: No Food Insecurity  . Worried About Charity fundraiser in the Last Year: Never true  . Ran Out of Food in the Last Year: Never true  Transportation Needs: No Transportation Needs  . Lack of Transportation (Medical): No  . Lack of Transportation (Non-Medical): No  Physical Activity: Sufficiently Active  . Days of Exercise per Week: 5 days  . Minutes of Exercise per Session: 30 min  Stress: No Stress Concern Present  . Feeling of Stress : Not at all  Social Connections: Unknown  . Frequency of Communication with Friends and Family: More than three times a week  . Frequency of Social Gatherings with Friends and Family: More than three times a week  .  Attends Religious Services: 1 to 4 times per year  . Active Member of Clubs or Organizations: Yes  . Attends Banker Meetings: More than 4 times per year  . Marital Status: Not on file        Assessment and Plan:  See Problem List for Assessment and Plan of chronic medical problems.   Follow Up Instructions:    I discussed the assessment and treatment plan with the patient. The patient was provided an opportunity to ask questions and all were answered. The patient agreed with the plan and demonstrated an understanding of the instructions.   The patient was advised to call back or seek an in-person evaluation if the symptoms worsen or if the condition fails to improve as anticipated.  Time spent on telephone call: 12 minutes  Pincus Sanes, MD

## 2019-06-30 ENCOUNTER — Encounter: Payer: Self-pay | Admitting: Internal Medicine

## 2019-07-01 ENCOUNTER — Telehealth: Payer: Self-pay

## 2019-07-01 DIAGNOSIS — M5416 Radiculopathy, lumbar region: Secondary | ICD-10-CM

## 2019-07-01 NOTE — Addendum Note (Signed)
Addended by: Pincus Sanes on: 07/01/2019 04:56 PM   Modules accepted: Orders

## 2019-07-01 NOTE — Telephone Encounter (Signed)
Wants a referral to ortho. Can this be put in as urgent?

## 2019-07-01 NOTE — Telephone Encounter (Signed)
What do you suggest for her

## 2019-07-01 NOTE — Telephone Encounter (Signed)
I would recommend evaluation by sports medicine or orthopedics.

## 2019-07-01 NOTE — Telephone Encounter (Signed)
New message    The patient calling C/o back pain.   No Emergency room visit , asking the nurse to call her back

## 2019-07-01 NOTE — Telephone Encounter (Signed)
Ordered

## 2019-07-02 ENCOUNTER — Encounter: Payer: Self-pay | Admitting: Surgery

## 2019-07-02 ENCOUNTER — Ambulatory Visit: Payer: Medicare Other | Admitting: Surgery

## 2019-07-02 ENCOUNTER — Ambulatory Visit: Payer: Medicare Other | Admitting: Vascular Surgery

## 2019-07-02 ENCOUNTER — Encounter: Payer: Self-pay | Admitting: Vascular Surgery

## 2019-07-02 ENCOUNTER — Ambulatory Visit: Payer: Self-pay

## 2019-07-02 ENCOUNTER — Other Ambulatory Visit: Payer: Self-pay

## 2019-07-02 VITALS — Ht 64.0 in | Wt 93.0 lb

## 2019-07-02 VITALS — BP 126/67 | HR 80 | Temp 97.8°F | Resp 20 | Ht 64.0 in | Wt 93.0 lb

## 2019-07-02 DIAGNOSIS — I714 Abdominal aortic aneurysm, without rupture, unspecified: Secondary | ICD-10-CM

## 2019-07-02 DIAGNOSIS — M5442 Lumbago with sciatica, left side: Secondary | ICD-10-CM

## 2019-07-02 DIAGNOSIS — M48062 Spinal stenosis, lumbar region with neurogenic claudication: Secondary | ICD-10-CM

## 2019-07-02 DIAGNOSIS — M5441 Lumbago with sciatica, right side: Secondary | ICD-10-CM

## 2019-07-02 NOTE — Progress Notes (Signed)
Patient name: Jennifer [name] MRN: [id number] DOB: 10-10-1940 Sex: female  REASON FOR VISIT:   To discuss abdominal aortic aneurysm with her sister present.  HPI:   Jennifer [name] is a pleasant 79 y.o. female who I saw in consultation on 06/25/2019.  She had a 5.4 cm infrarenal abdominal aortic aneurysm.  This had been interpreted as being larger, however when accounting for the tortuosity of the infrarenal aorta I felt the actual diameter was 5.4 cm.  Based on her anatomy and significant tortuosity and calcific disease of her aortoiliac arteries I did not think she would be a candidate for endovascular repair or a fenestrated graft.  She would likely require open repair which would certainly be associated with significant risk given her calcific disease and her age.  She comes in today to discuss this with her sister present.  Her main complaint is significant back pain related to her significant degenerative disc disease of her back and scoliosis.  She had a very difficult time getting comfortable.  She has had chronic back pain for many years.  She denies any new onset abdominal or back pain.  Current Outpatient Medications  Medication Sig Dispense Refill  . ALPRAZolam (XANAX) 0.25 MG tablet TAKE 1 TABLET BY MOUTH EVERY NIGHT AT BEDTIME AS NEEDED FOR ANXIETY OR SLEEP (Patient taking differently: Take 0.25 mg by mouth at bedtime as needed for anxiety or sleep. ) 30 tablet 2  . amLODipine (NORVASC) 5 MG tablet TAKE 1 TABLET BY MOUTH EVERY NIGHT AT BEDTIME (Patient taking differently: Take 5 mg by mouth daily. ) 90 tablet 1  . aspirin 81 MG tablet Take 81 mg by mouth daily before breakfast.     . atorvastatin (LIPITOR) 20 MG tablet TAKE 1 TABLET BY MOUTH EVERY DAY (Patient taking differently: Take 20 mg by mouth daily at 6 PM. ) 90 tablet 1  . calcium-vitamin D (OSCAL WITH D) 500-200 MG-UNIT per tablet Take 2 tablets by mouth daily.    . cilostazol (PLETAL) 50 MG tablet Take 1 tablet (50 mg  total) by mouth 2 (two) times daily. 180 tablet 1  . Diclofenac Sodium 1 % CREA Apply 1 application topically 2 (two) times daily. 120 g 0  . gabapentin (NEURONTIN) 100 MG capsule Take 1-3 capsules (100-300 mg total) by mouth at bedtime. 90 capsule 3  . HYDROcodone-acetaminophen (NORCO) 7.5-325 MG tablet Take 1 tablet by mouth every 4 (four) hours as needed for moderate pain. 30 tablet 0  . omeprazole (PRILOSEC) 40 MG capsule Take 1 capsule (40 mg total) by mouth daily. Patient needs office visit for further refills 90 capsule 0  . potassium chloride SA (K-DUR) 20 MEQ tablet TAKE 1 TABLET(20 MEQ) BY MOUTH DAILY (Patient taking differently: Take 20 mEq by mouth daily. ) 90 tablet 3   No current facility-administered medications for this visit.    REVIEW OF SYSTEMS:  [X]  denotes positive finding, [ ]  denotes negative finding Vascular    Leg swelling    Cardiac    Chest pain or chest pressure:    Shortness of breath upon exertion:    Short of breath when lying flat:    Irregular heart rhythm:    Constitutional    Fever or chills:     PHYSICAL EXAM:   Vitals:   07/02/19 1238  BP: 126/67  Pulse: 80  Resp: 20  Temp: 97.8 F (36.6 C)  SpO2: 94%  Weight: 93 lb (42.2 kg)  Height: 5\' 4"  (  1.626 m)    GENERAL: The patient is a well-nourished female, in no acute distress. The vital signs are documented above. CARDIOVASCULAR: There is a regular rate and rhythm. PULMONARY: There is good air exchange bilaterally without wheezing or rales.  DATA:   I again reviewed her CT angiogram of the abdomen and pelvis.  Given the tortuosity of her infrarenal aorta the maximum diameter I obtain of her infrarenal aorta is 5.3 cm.  She has severe calcific disease of her infrarenal aorta, iliac arteries, and femoral arteries.  Given the tortuosity in her iliacs and severe calcific disease in addition to the tortuous neck she is not a candidate for endovascular approach nor would she be a candidate for a  fenestrated graft.  Her only option would be open repair which would be associated with significant risk given her severe calcific disease, age, and somewhat debilitated state related to her back issues.  MEDICAL ISSUES:   5.3 CM INFRARENAL ABDOMINAL AORTIC ANEURYSM: I think her only option for repair would be open repair which would be associated with significant risk for all the reasons mentioned above.  At this point I think the risk of repair is significantly greater than the risk of following this.  If the aneurysm enlarge significantly we would have to regroup and reconsider surgery.  However again this would be very high risk.  After extensive discussion with the patient and with her sister who was present today we all agree that at this point we would not recommend open repair of her aneurysm.  For this reason she would like to cancel her scheduled stress test and echo which I think is reasonable.  I have ordered follow-up ultrasound in 6 months and I will see her back at that time.  She knows to call sooner if she has problems.  [name] [facility] [phone]

## 2019-07-02 NOTE — Progress Notes (Signed)
79 year old white female who is new patient the office is being seen at the request of her primary care physician Dr. Billey Gosling for back pain and leg pain.  I have reviewed patient's chart.  States that she was recently seen in the emergency room and was found at that time to have a 5.4 cm abdominal aortic aneurysm.  See report below:  EXAM: CTA ABDOMEN AND PELVIS WITHOUT AND WITH CONTRAST  TECHNIQUE: Multidetector CT imaging of the abdomen and pelvis was performed using the standard protocol during bolus administration of intravenous contrast. Multiplanar reconstructed images and MIPs were obtained and reviewed to evaluate the vascular anatomy.  CONTRAST:  15mL OMNIPAQUE IOHEXOL 350 MG/ML SOLN  COMPARISON:  None.  FINDINGS: VASCULAR  Aorta: Large abdominal aortic aneurysm measures 7.1 x 5.4 cm. Markedly tortuous aorta. The aorta tapers distally but remains aneurysmal above the bifurcation measuring 3.1 cm. Heavily calcified aorta.  Celiac: Focal stenosis at the origin, greater than 50%.  Patent.  SMA: Focal stenosis at the origin, less than 50%, patent  Renals: Single renal arteries bilaterally. No definite significant stenosis.  IMA: Patent  Inflow: Heavily calcified, tortuous and ectatic iliofemoral vessels. No aneurysm.  Proximal Outflow: Heavily calcified common femoral arteries and proximal superficial femoral arteries.  Veins: No obvious venous abnormality within the limitations of this arterial phase study.  Review of the MIP images confirms the above findings.  NON-VASCULAR  Lower chest: Cardiomegaly.  Moderate-sized hiatal hernia.  Hepatobiliary: Small scattered hypodensities in the liver, likely cysts. No biliary ductal dilatation. Gallbladder unremarkable.  Pancreas: No focal abnormality or ductal dilatation.  Spleen: No focal abnormality.  Normal size.  Adrenals/Urinary Tract: No renal or adrenal mass. No hydronephrosis. Urinary  bladder unremarkable.  Stomach/Bowel: Moderate stool burden. Sigmoid diverticulosis. No active diverticulitis. Stomach and small bowel grossly unremarkable. No bowel obstruction.  Lymphatic: No adenopathy  Reproductive: Uterus and adnexa unremarkable.  No mass.  Other: No free fluid or free air.  Musculoskeletal: No acute bony abnormality.  IMPRESSION: Large abdominal aortic aneurysm measuring up to 7.1 cm. Extensive mural thrombus/plaque and calcifications. Aorta is markedly tortuous and remains slightly aneurysmal at just above the aortic bifurcation at 3 cm. Vascular surgery consultation recommended due to increased risk of rupture for AAA >5.5 cm. This recommendation follows ACR consensus guidelines: White Paper of the ACR Incidental Findings Committee II on Vascular Findings. J Am Coll Radiol 2013; 10:789-794. Aortic aneurysm NOS (ICD10-I71.9)  Heavily calcified aorta femoral vessels without aneurysm.  Moderate-sized hiatal hernia.  Cardiomegaly.  Sigmoid diverticulosis.  No active diverticulitis.   Electronically Signed   By: Rolm Baptise M.D.   On: 06/03/2019 18:57  She was seen by vascular surgeon Dr. Deitra Mayo for this who is recommending surgical intervention.  Patient understand that she is at significant increased risk of this rupturing.  Dr. Scot Dock referred her to cardiology for preop clearance.  Patient states that she is awaiting scheduling of echocardiogram and stress test.  States that she has worsening back pain.  She has never had any treatment for her spine in the past.  She describes pain in the left lower extremity.  She also has known history of peripheral artery disease.  States that she had an appointment with Dr. Scot Dock yesterday but had to cancel due to not having a ride.   X-rays Lumbar spine show significant multilevel lumbar spondylosis with degenerative scoliotic curve.  5.4 cm abdominal aortic aneurysm seen on lateral  view.   Assessment Abdominal aortic aneurysm with  peripheral artery disease. Multilevel lumbar spondylosis/stenosis. Claudication  Plan My assistant contacted vascular surgery office today.  Patient was actually supposed to see Dr. Edilia Bo this morning.  They agreed to work patient in this afternoon which is greatly appreciated.  I advised patient that she must have this aneurysm issue addressed per recommendations by vascular surgery.  No treatment options recommended at this time for her lumbar spine.  I advised patient that I would not even order further imaging studies of her lumbar spine until her other problem is addressed.  I discussed with her at length of the risk of the aneurysm rupturing which can lead to immediate death.  She voices understanding.  I did not charge patient for today's visit.  She can follow-up with Dr. Ophelia Charter in 2 months for her lumbar spine.  All questions answered.

## 2019-07-03 ENCOUNTER — Other Ambulatory Visit: Payer: Self-pay | Admitting: *Deleted

## 2019-07-03 ENCOUNTER — Telehealth: Payer: Self-pay

## 2019-07-03 DIAGNOSIS — I714 Abdominal aortic aneurysm, without rupture, unspecified: Secondary | ICD-10-CM

## 2019-07-03 DIAGNOSIS — M5416 Radiculopathy, lumbar region: Secondary | ICD-10-CM

## 2019-07-03 NOTE — Telephone Encounter (Addendum)
Patient calling and states that she would like a call back from Dr Lawerance Bach in regards to her back pain, the aneurism that can't be oporated on, and that she would like to go to [facility]. Please advise.  CB#: [phone]

## 2019-07-03 NOTE — Telephone Encounter (Signed)
Can you put in another referral ° °

## 2019-07-03 NOTE — Telephone Encounter (Signed)
Referral ordered.    If she wants to discuss aneurysm I guess we would have to set up a phone call visit

## 2019-07-03 NOTE — Telephone Encounter (Signed)
Will need a new referral. [facility] went [facility] with the referral Dr.Burns put in on 3/9 thanks

## 2019-07-03 NOTE — Progress Notes (Signed)
Patient contacted.  Echo cancelled.

## 2019-07-03 NOTE — Telephone Encounter (Signed)
Pt wants this referral to go to [facility]. Dr. Lawerance Bach put a referral in on 3/9.

## 2019-07-04 NOTE — Telephone Encounter (Signed)
She says hydrocodone does not help and gabapentin she takes as needed but that does not really help her. She said she will call [facility] and kind of sounded frustrated and hung up.

## 2019-07-04 NOTE — Telephone Encounter (Signed)
Did the hydrocodone help?  What does of gabapentin is she taking/   She should call [facility]( [facility]) herself too - I did put in a referral but she may be able to get an appointment sooner if she calls herself.

## 2019-07-04 NOTE — Telephone Encounter (Signed)
Pt wants to know if you could prescribe something else for pain. Pain is severe but keeps refusing to go to ED

## 2019-07-05 ENCOUNTER — Emergency Department (HOSPITAL_COMMUNITY): Payer: Medicare Other

## 2019-07-05 ENCOUNTER — Other Ambulatory Visit: Payer: Self-pay

## 2019-07-05 ENCOUNTER — Inpatient Hospital Stay (HOSPITAL_COMMUNITY)
Admission: EM | Admit: 2019-07-05 | Discharge: 2019-07-18 | DRG: 516 | Disposition: A | Discharge status: Skilled Nursing Facility | Payer: Medicare Other | Attending: Internal Medicine | Admitting: Internal Medicine

## 2019-07-05 DIAGNOSIS — G8929 Other chronic pain: Secondary | ICD-10-CM | POA: Diagnosis not present

## 2019-07-05 DIAGNOSIS — Z03818 Encounter for observation for suspected exposure to other biological agents ruled out: Secondary | ICD-10-CM | POA: Diagnosis not present

## 2019-07-05 DIAGNOSIS — E876 Hypokalemia: Secondary | ICD-10-CM | POA: Diagnosis present

## 2019-07-05 DIAGNOSIS — M810 Age-related osteoporosis without current pathological fracture: Secondary | ICD-10-CM | POA: Diagnosis not present

## 2019-07-05 DIAGNOSIS — Z8249 Family history of ischemic heart disease and other diseases of the circulatory system: Secondary | ICD-10-CM

## 2019-07-05 DIAGNOSIS — M4856XA Collapsed vertebra, not elsewhere classified, lumbar region, initial encounter for fracture: Secondary | ICD-10-CM | POA: Diagnosis not present

## 2019-07-05 DIAGNOSIS — Z20822 Contact with and (suspected) exposure to covid-19: Secondary | ICD-10-CM | POA: Diagnosis present

## 2019-07-05 DIAGNOSIS — W06XXXA Fall from bed, initial encounter: Secondary | ICD-10-CM | POA: Diagnosis present

## 2019-07-05 DIAGNOSIS — M2550 Pain in unspecified joint: Secondary | ICD-10-CM | POA: Diagnosis not present

## 2019-07-05 DIAGNOSIS — Z79899 Other long term (current) drug therapy: Secondary | ICD-10-CM | POA: Diagnosis not present

## 2019-07-05 DIAGNOSIS — Z538 Procedure and treatment not carried out for other reasons: Secondary | ICD-10-CM | POA: Diagnosis not present

## 2019-07-05 DIAGNOSIS — K5900 Constipation, unspecified: Secondary | ICD-10-CM | POA: Diagnosis not present

## 2019-07-05 DIAGNOSIS — Z801 Family history of malignant neoplasm of trachea, bronchus and lung: Secondary | ICD-10-CM

## 2019-07-05 DIAGNOSIS — M545 Low back pain: Secondary | ICD-10-CM | POA: Diagnosis not present

## 2019-07-05 DIAGNOSIS — F411 Generalized anxiety disorder: Secondary | ICD-10-CM | POA: Diagnosis present

## 2019-07-05 DIAGNOSIS — Z7401 Bed confinement status: Secondary | ICD-10-CM | POA: Diagnosis not present

## 2019-07-05 DIAGNOSIS — M8448XA Pathological fracture, other site, initial encounter for fracture: Secondary | ICD-10-CM | POA: Diagnosis present

## 2019-07-05 DIAGNOSIS — F329 Major depressive disorder, single episode, unspecified: Secondary | ICD-10-CM | POA: Diagnosis present

## 2019-07-05 DIAGNOSIS — Z961 Presence of intraocular lens: Secondary | ICD-10-CM | POA: Diagnosis present

## 2019-07-05 DIAGNOSIS — K219 Gastro-esophageal reflux disease without esophagitis: Secondary | ICD-10-CM | POA: Diagnosis not present

## 2019-07-05 DIAGNOSIS — S32019A Unspecified fracture of first lumbar vertebra, initial encounter for closed fracture: Secondary | ICD-10-CM | POA: Diagnosis not present

## 2019-07-05 DIAGNOSIS — M1990 Unspecified osteoarthritis, unspecified site: Secondary | ICD-10-CM | POA: Diagnosis not present

## 2019-07-05 DIAGNOSIS — Z79891 Long term (current) use of opiate analgesic: Secondary | ICD-10-CM

## 2019-07-05 DIAGNOSIS — I714 Abdominal aortic aneurysm, without rupture, unspecified: Secondary | ICD-10-CM | POA: Diagnosis present

## 2019-07-05 DIAGNOSIS — Z7982 Long term (current) use of aspirin: Secondary | ICD-10-CM | POA: Diagnosis not present

## 2019-07-05 DIAGNOSIS — E785 Hyperlipidemia, unspecified: Secondary | ICD-10-CM | POA: Diagnosis present

## 2019-07-05 DIAGNOSIS — Z9841 Cataract extraction status, right eye: Secondary | ICD-10-CM | POA: Diagnosis not present

## 2019-07-05 DIAGNOSIS — M549 Dorsalgia, unspecified: Secondary | ICD-10-CM | POA: Diagnosis present

## 2019-07-05 DIAGNOSIS — Z716 Tobacco abuse counseling: Secondary | ICD-10-CM | POA: Diagnosis not present

## 2019-07-05 DIAGNOSIS — D649 Anemia, unspecified: Secondary | ICD-10-CM | POA: Diagnosis not present

## 2019-07-05 DIAGNOSIS — Z803 Family history of malignant neoplasm of breast: Secondary | ICD-10-CM | POA: Diagnosis not present

## 2019-07-05 DIAGNOSIS — M48061 Spinal stenosis, lumbar region without neurogenic claudication: Secondary | ICD-10-CM | POA: Diagnosis not present

## 2019-07-05 DIAGNOSIS — S32010D Wedge compression fracture of first lumbar vertebra, subsequent encounter for fracture with routine healing: Secondary | ICD-10-CM | POA: Diagnosis not present

## 2019-07-05 DIAGNOSIS — M4186 Other forms of scoliosis, lumbar region: Secondary | ICD-10-CM | POA: Diagnosis not present

## 2019-07-05 DIAGNOSIS — R5381 Other malaise: Secondary | ICD-10-CM | POA: Diagnosis not present

## 2019-07-05 DIAGNOSIS — I739 Peripheral vascular disease, unspecified: Secondary | ICD-10-CM | POA: Diagnosis present

## 2019-07-05 DIAGNOSIS — S32010A Wedge compression fracture of first lumbar vertebra, initial encounter for closed fracture: Secondary | ICD-10-CM | POA: Diagnosis not present

## 2019-07-05 DIAGNOSIS — Z720 Tobacco use: Secondary | ICD-10-CM | POA: Diagnosis present

## 2019-07-05 DIAGNOSIS — Y92009 Unspecified place in unspecified non-institutional (private) residence as the place of occurrence of the external cause: Secondary | ICD-10-CM | POA: Diagnosis not present

## 2019-07-05 DIAGNOSIS — Z4789 Encounter for other orthopedic aftercare: Secondary | ICD-10-CM | POA: Diagnosis not present

## 2019-07-05 DIAGNOSIS — E46 Unspecified protein-calorie malnutrition: Secondary | ICD-10-CM | POA: Diagnosis not present

## 2019-07-05 DIAGNOSIS — J449 Chronic obstructive pulmonary disease, unspecified: Secondary | ICD-10-CM | POA: Diagnosis not present

## 2019-07-05 DIAGNOSIS — I10 Essential (primary) hypertension: Secondary | ICD-10-CM | POA: Diagnosis present

## 2019-07-05 DIAGNOSIS — F17210 Nicotine dependence, cigarettes, uncomplicated: Secondary | ICD-10-CM | POA: Diagnosis present

## 2019-07-05 DIAGNOSIS — M62838 Other muscle spasm: Secondary | ICD-10-CM | POA: Diagnosis not present

## 2019-07-05 DIAGNOSIS — S3219XD Other fracture of sacrum, subsequent encounter for fracture with routine healing: Secondary | ICD-10-CM | POA: Diagnosis not present

## 2019-07-05 DIAGNOSIS — M792 Neuralgia and neuritis, unspecified: Secondary | ICD-10-CM | POA: Diagnosis not present

## 2019-07-05 DIAGNOSIS — M4846XA Fatigue fracture of vertebra, lumbar region, initial encounter for fracture: Secondary | ICD-10-CM

## 2019-07-05 DIAGNOSIS — Z743 Need for continuous supervision: Secondary | ICD-10-CM | POA: Diagnosis not present

## 2019-07-05 LAB — BASIC METABOLIC PANEL
Anion gap: 13 (ref 5–15)
BUN: 52 mg/dL — ABNORMAL HIGH (ref 8–23)
CO2: 23 mmol/L (ref 22–32)
Calcium: 9.4 mg/dL (ref 8.9–10.3)
Chloride: 98 mmol/L (ref 98–111)
Creatinine, Ser: 1.17 mg/dL — ABNORMAL HIGH (ref 0.44–1.00)
GFR calc Af Amer: 51 mL/min — ABNORMAL LOW (ref >60)
GFR calc non Af Amer: 44 mL/min — ABNORMAL LOW (ref >60)
Glucose, Bld: 109 mg/dL — ABNORMAL HIGH (ref 70–99)
Potassium: 3.3 mmol/L — ABNORMAL LOW (ref 3.5–5.1)
Sodium: 134 mmol/L — ABNORMAL LOW (ref 135–145)

## 2019-07-05 LAB — CBC WITH DIFFERENTIAL/PLATELET
Abs Immature Granulocytes: 0.05 10*3/uL (ref 0.00–0.07)
Basophils Absolute: 0 10*3/uL (ref 0.0–0.1)
Basophils Relative: 0 %
Eosinophils Absolute: 0 10*3/uL (ref 0.0–0.5)
Eosinophils Relative: 0 %
HCT: 30.2 % — ABNORMAL LOW (ref 36.0–46.0)
Hemoglobin: 10.1 g/dL — ABNORMAL LOW (ref 12.0–15.0)
Immature Granulocytes: 1 %
Lymphocytes Relative: 12 %
Lymphs Abs: 1.1 10*3/uL (ref 0.7–4.0)
MCH: 27.6 pg (ref 26.0–34.0)
MCHC: 33.4 g/dL (ref 30.0–36.0)
MCV: 82.5 fL (ref 80.0–100.0)
Monocytes Absolute: 0.8 10*3/uL (ref 0.1–1.0)
Monocytes Relative: 8 %
Neutro Abs: 7.7 10*3/uL (ref 1.7–7.7)
Neutrophils Relative %: 79 %
Platelets: 331 10*3/uL (ref 150–400)
RBC: 3.66 MIL/uL — ABNORMAL LOW (ref 3.87–5.11)
RDW: 14.5 % (ref 11.5–15.5)
WBC: 9.7 10*3/uL (ref 4.0–10.5)
nRBC: 0 % (ref 0.0–0.2)

## 2019-07-05 LAB — POC OCCULT BLOOD, ED: Fecal Occult Bld: NEGATIVE

## 2019-07-05 IMAGING — CR DG LUMBAR SPINE COMPLETE 4+V
5 series · 5 of 5 positions shown · non-contrast
Comparison: Radiograph 3 days ago [DATE]. Reformats from
abdominal CT [DATE]

CLINICAL DATA: Back pain. Patient reports pain for months.

EXAM:
LUMBAR SPINE - COMPLETE 4+ VIEW

[t lumbar spine ap]
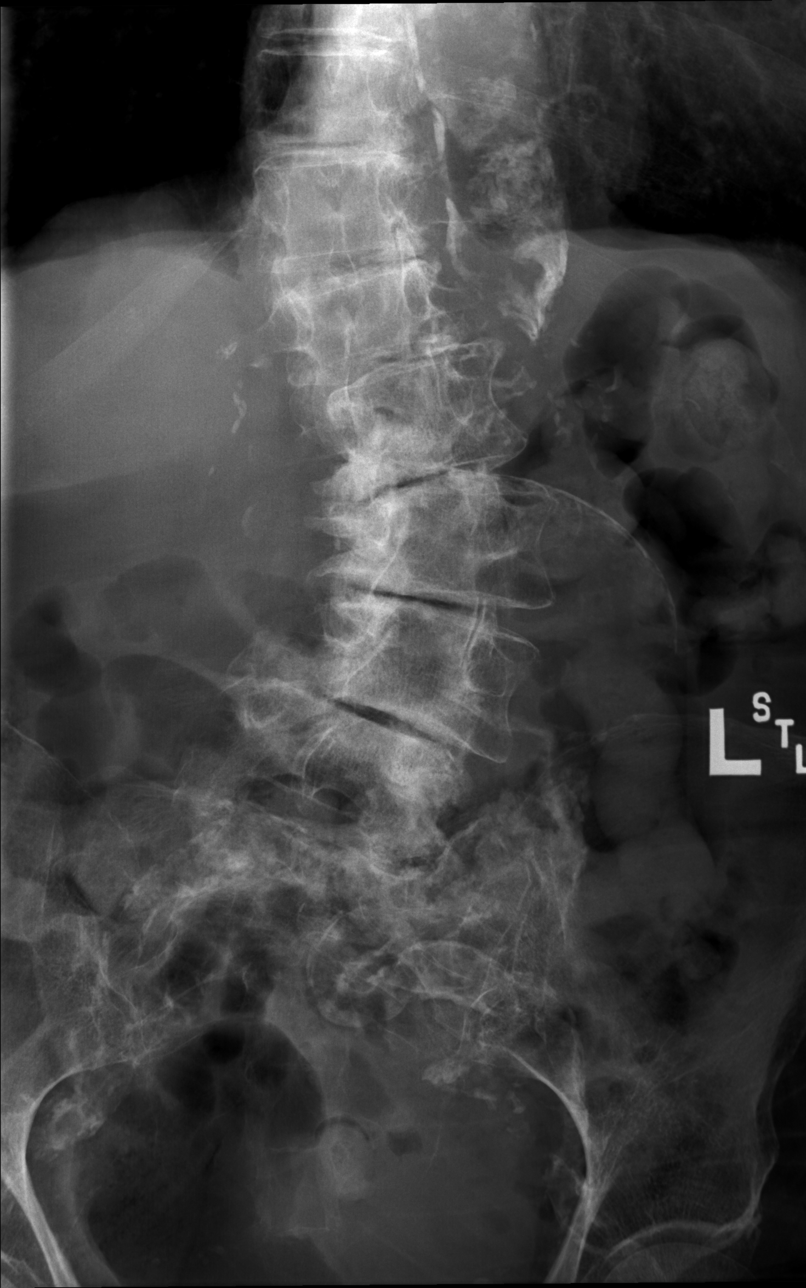

[t lumbar spine obl (1 of 2)]
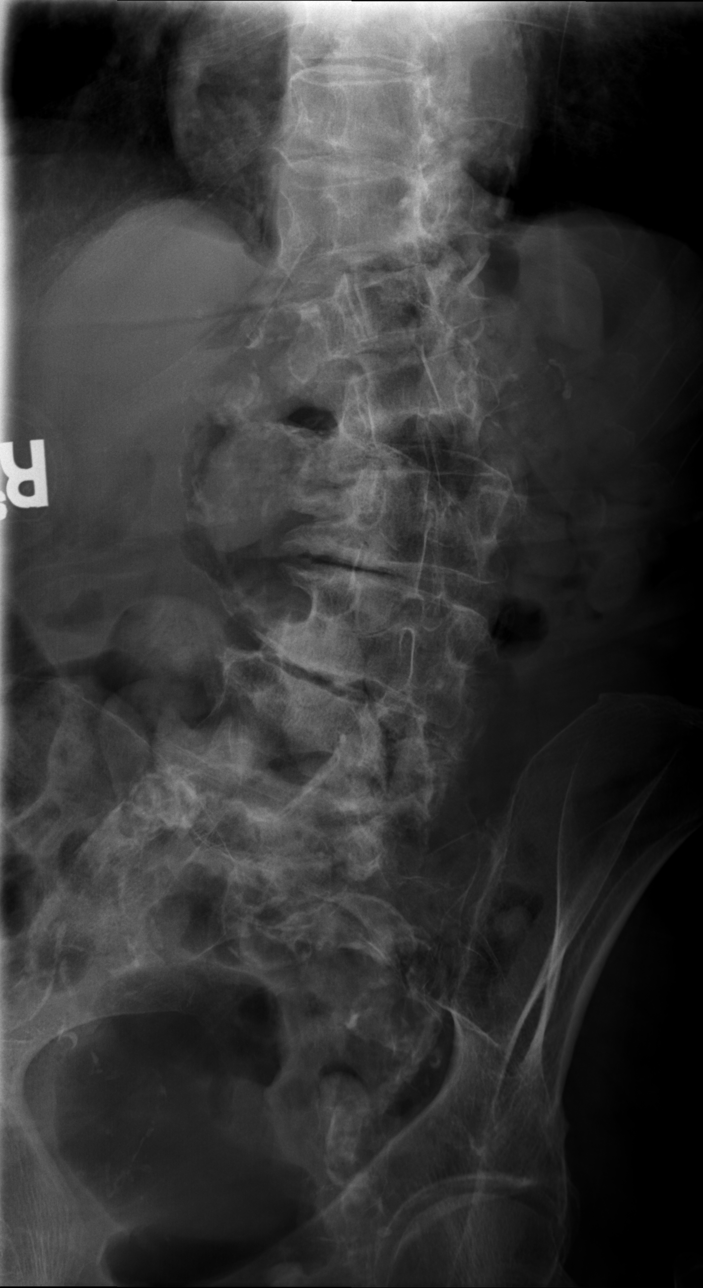

[t lumbar spine obl (2 of 2)]
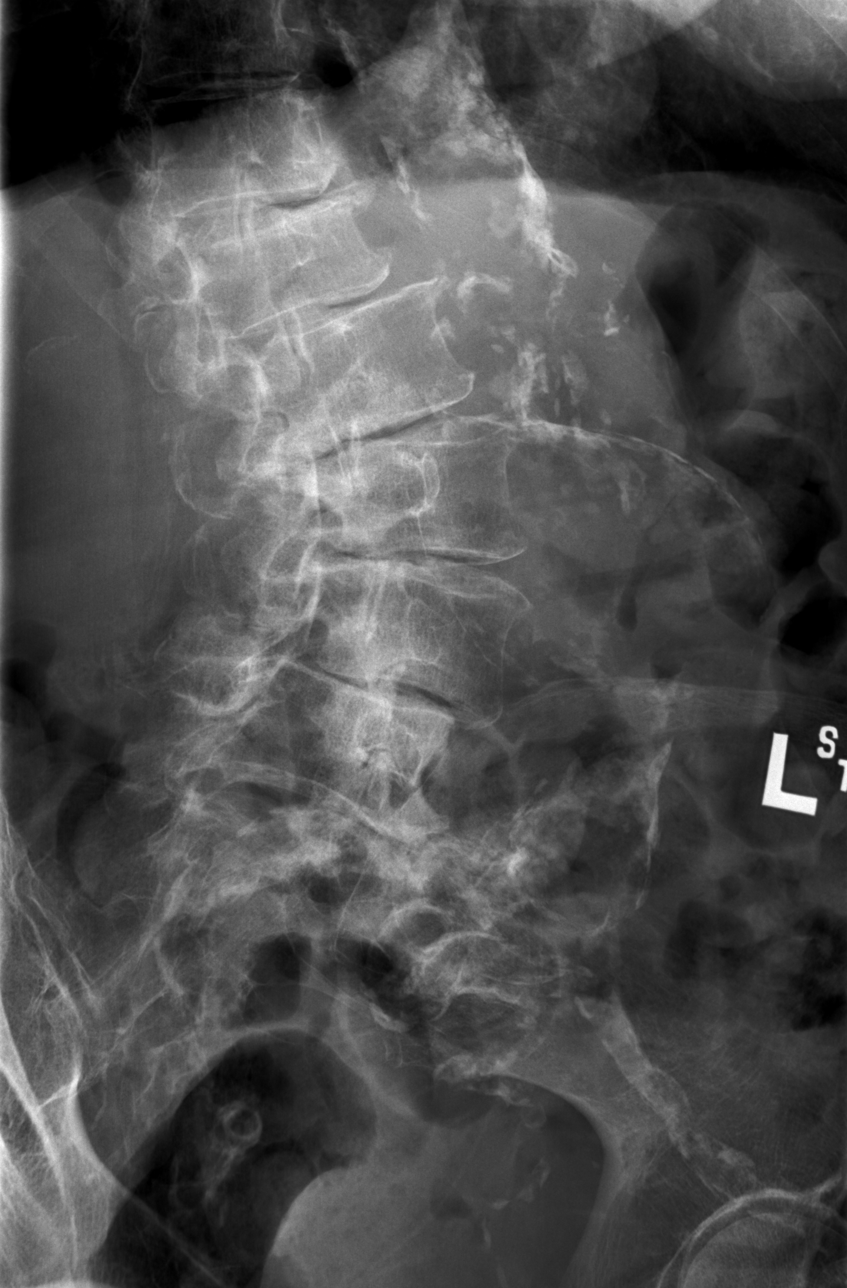

[t lumbar spine lat]
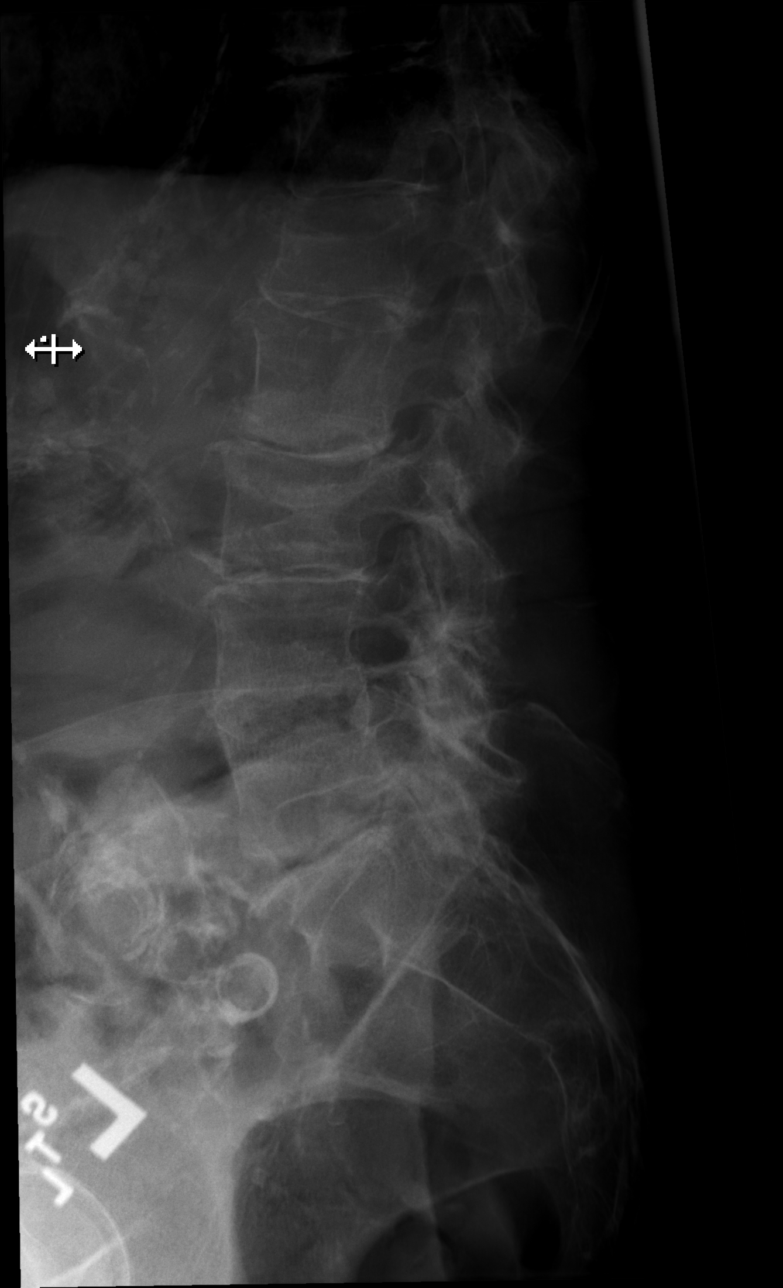

[t lumbar l-5 s-1 spot]
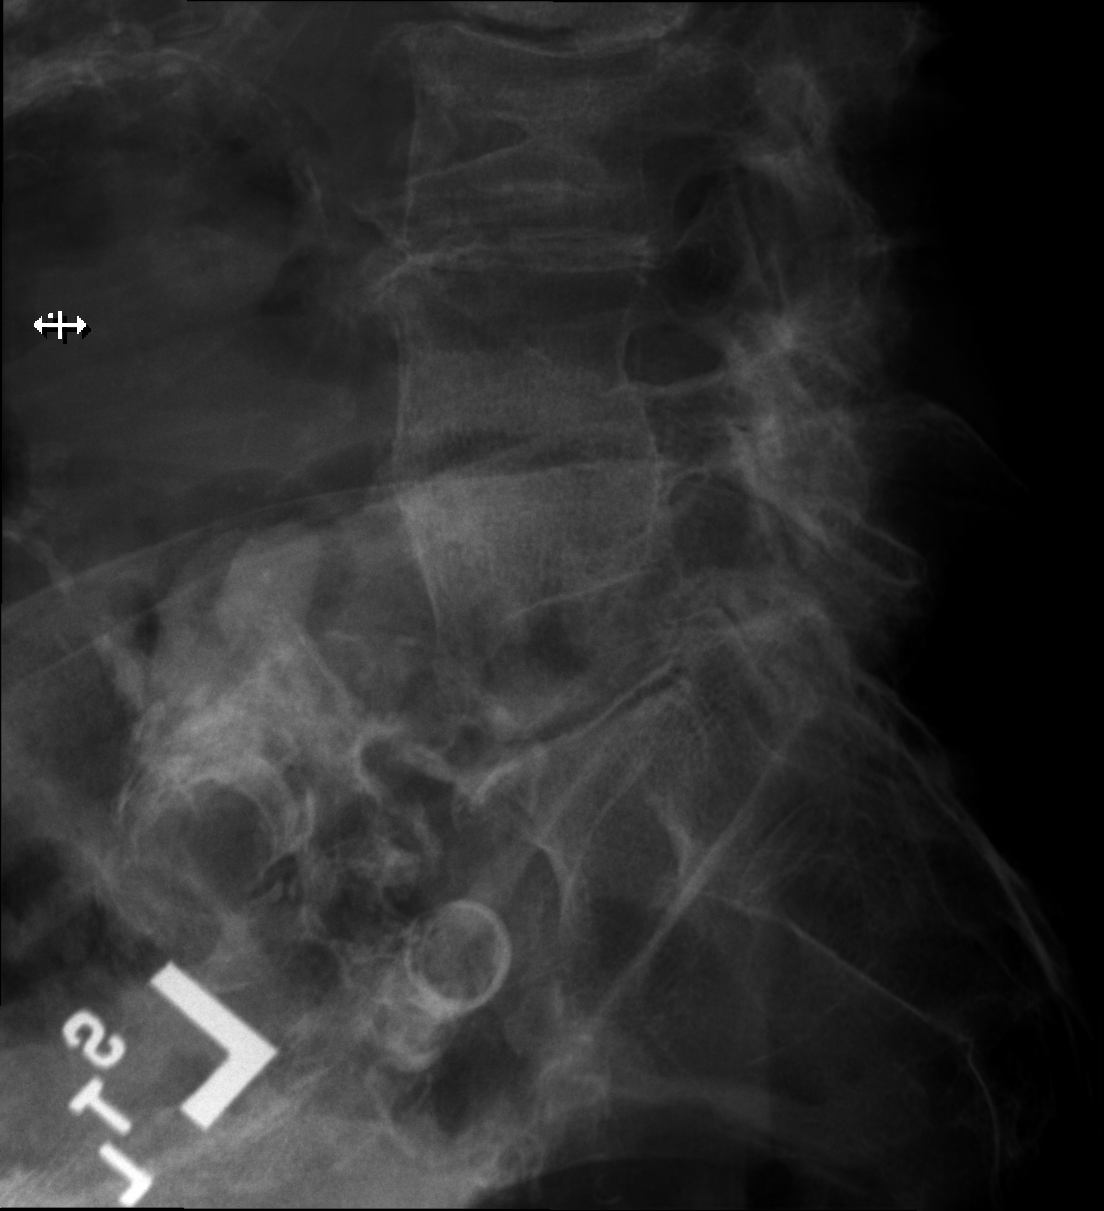

[5 of 5 positions shown; findings below may reference images not displayed]

FINDINGS: Levo scoliotic curvature of the lumbar spine of 40 degrees. Mild
left lateral translation of L4 on L5, degenerative. Diffuse
degenerative disc disease and facet hypertrophy. Vertebral body
heights are preserved. No acute or compression fracture. The bones
are under mineralized. Calcified aortic atherosclerosis. Aortic
aneurysm demonstrated on prior CTA.
IMPRESSION: Scoliosis and multilevel degenerative change in the lumbar spine. No
evidence of acute abnormality.

## 2019-07-05 MED ORDER — MORPHINE SULFATE (PF) 2 MG/ML IV SOLN
2.0000 mg | INTRAVENOUS | Status: DC | PRN
  Administered 2019-07-05 – 2019-07-18 (×3): 2 mg via INTRAVENOUS
  Filled 2019-07-05 (×3): qty 1

## 2019-07-05 MED ORDER — ONDANSETRON HCL 4 MG PO TABS: 4.0000 mg | ORAL_TABLET | Freq: Four times a day (QID) | ORAL | Status: DC | PRN

## 2019-07-05 MED ORDER — ACETAMINOPHEN 325 MG PO TABS
650.0000 mg | ORAL_TABLET | Freq: Four times a day (QID) | ORAL | Status: DC | PRN
  Administered 2019-07-05: 650 mg via ORAL
  Filled 2019-07-05: qty 2

## 2019-07-05 MED ORDER — SODIUM CHLORIDE 0.9 % IV BOLUS (SEPSIS)
500.0000 mL | Freq: Once | INTRAVENOUS | Status: AC
  Administered 2019-07-05: 500 mL via INTRAVENOUS

## 2019-07-05 MED ORDER — SODIUM CHLORIDE 0.9 % IV SOLN
1000.0000 mL | INTRAVENOUS | Status: DC
  Administered 2019-07-05 – 2019-07-09 (×7): 1000 mL via INTRAVENOUS

## 2019-07-05 MED ORDER — ENOXAPARIN SODIUM 40 MG/0.4ML ~~LOC~~ SOLN: 40.0000 mg | SUBCUTANEOUS | Status: DC

## 2019-07-05 MED ORDER — ENOXAPARIN SODIUM 30 MG/0.3ML ~~LOC~~ SOLN
30.0000 mg | SUBCUTANEOUS | Status: DC
  Administered 2019-07-05 – 2019-07-07 (×3): 30 mg via SUBCUTANEOUS
  Filled 2019-07-05 (×3): qty 0.3

## 2019-07-05 MED ORDER — ACETAMINOPHEN 650 MG RE SUPP: 650.0000 mg | Freq: Four times a day (QID) | RECTAL | Status: DC | PRN

## 2019-07-05 MED ORDER — ONDANSETRON HCL 4 MG/2ML IJ SOLN
4.0000 mg | Freq: Four times a day (QID) | INTRAMUSCULAR | Status: DC | PRN
  Administered 2019-07-15 – 2019-07-18 (×2): 4 mg via INTRAVENOUS
  Filled 2019-07-05 (×2): qty 2

## 2019-07-05 MED ORDER — HYDROMORPHONE HCL 1 MG/ML IJ SOLN
1.0000 mg | Freq: Once | INTRAMUSCULAR | Status: AC
  Administered 2019-07-05: 1 mg via INTRAVENOUS
  Filled 2019-07-05: qty 1

## 2019-07-05 NOTE — ED Provider Notes (Addendum)
[facility]-EMERGENCY DEPT Provider Note   CSN: [id number] Arrival date & time: 07/05/19  1422     History Chief Complaint  Patient presents with  . Back Pain    Jennifer [name] is a 79 y.o. female.  HPI   Pt has been having back pain now for several weeks.  She was evaluated for this recently and they found she had an abdominal aneurysm.  Initially it was measured at 7.1 cm. She saw vascular surgery.  They did not feel like she was a good candidate for surgery and did not feel that was the source of her pain (Dr Edilia Bo felt aneurysm was 5.2cm).  She is scheduled to see Dr Ethelene Hal on Tuesday.  She previously saw [facility] ortho care [place].  Pt is here because she is still having pain.  She took hydrocodone last weekend prescribed by her primary care doctor.  It helped initially but did not help as much.  She took the hydrocodone without relief. Pt is having trouble just moving around now. SHe had an outpt MRI this past Thursday at [facility] ortho.  Past Medical History:  Diagnosis Date  . ANEMIA-NOS   . Cataract    right cataract removed  . Clotting disorder (HCC)    Pletal  . Depression    no longer taking meds/ resolved  . DYSLIPIDEMIA   . GERD (gastroesophageal reflux disease)   . Hemorrhoids   . HYPERTENSION   . Negative colorectal cancer screening using Cologuard test   . Osteoporosis, senile    DEXA 03/2014: -3.3 R fem  . Peripheral vascular disease, unspecified (HCC) dx 02/2010 ABI  . SMOKER     Patient Active Problem List   Diagnosis Date Noted  . Lumbar radiculopathy 06/27/2019  . Lung nodule 06/10/2019  . Abdominal aortic aneurysm (AAA) (HCC) 06/10/2019  . Chest pain, musculoskeletal 06/10/2019  . Ascending aortic aneurysm (HCC) 06/10/2019  . Hiatal hernia, large 01/09/2018  . GERD (gastroesophageal reflux disease) 12/08/2016  . History of Clostridium difficile infection 11/29/2016  . Prediabetes 11/02/2016  . Carotid arterial disease  (HCC) 10/20/2015  . Anxiety state   . PAD (peripheral artery disease) (HCC) 10/10/2011  . Osteoporosis, senile 05/17/2011  . Dyslipidemia 05/03/2009  . Tobacco abuse 04/14/2009  . Essential hypertension 04/14/2009    Past Surgical History:  Procedure Laterality Date  . CATARACT EXTRACTION W/ INTRAOCULAR LENS IMPLANT Right 03/12/14   groat  . HEMORRHOID SURGERY  05/18/2011   Procedure: HEMORRHOIDECTOMY;  Surgeon: Robyne Askew, MD;  Location: [facility] ORS;  Service: General;  Laterality: N/A;  hemorrhoidectomy, three columns, internal and external  . TONSILLECTOMY  1946  . TUBAL LIGATION  1979  . UPPER GASTROINTESTINAL ENDOSCOPY  01/03/2018     OB History   No obstetric history on file.     Family History  Problem Relation Age of Onset  . Breast cancer Mother   . Lung cancer Father   . Hypertension Brother   . Colon cancer Neg Hx   . Esophageal cancer Neg Hx   . Stomach cancer Neg Hx   . Rectal cancer Neg Hx     Social History   Tobacco Use  . Smoking status: Current Every Day Smoker    Packs/day: 0.50    Years: 62.00    Pack years: 31.00    Types: Cigarettes  . Smokeless tobacco: Never Used  Substance Use Topics  . Alcohol use: Yes    Alcohol/week: 0.0 standard drinks  Comment: Social- 1 scotch, 1 glass wine weekly  . Drug use: No    Home Medications Prior to Admission medications   Medication Sig Start Date End Date Taking? Authorizing Provider  ALPRAZolam (XANAX) 0.25 MG tablet TAKE 1 TABLET BY MOUTH EVERY NIGHT AT BEDTIME AS NEEDED FOR ANXIETY OR SLEEP Patient taking differently: Take 0.25 mg by mouth at bedtime as needed for anxiety or sleep.  02/05/19   Burns, Bobette Mo, MD  amLODipine (NORVASC) 5 MG tablet TAKE 1 TABLET BY MOUTH EVERY NIGHT AT BEDTIME Patient taking differently: Take 5 mg by mouth daily.  06/03/19   Pincus Sanes, MD  aspirin 81 MG tablet Take 81 mg by mouth daily before breakfast.     [provider]  atorvastatin (LIPITOR) 20 MG  tablet TAKE 1 TABLET BY MOUTH EVERY DAY Patient taking differently: Take 20 mg by mouth daily at 6 PM.  05/05/19   Burns, Bobette Mo, MD  calcium-vitamin D (OSCAL WITH D) 500-200 MG-UNIT per tablet Take 2 tablets by mouth daily.    [provider]  cilostazol (PLETAL) 50 MG tablet Take 1 tablet (50 mg total) by mouth 2 (two) times daily. 06/11/19   Pincus Sanes, MD  Diclofenac Sodium 1 % CREA Apply 1 application topically 2 (two) times daily. 06/03/19   Felicie Morn, NP  gabapentin (NEURONTIN) 100 MG capsule Take 1-3 capsules (100-300 mg total) by mouth at bedtime. 06/27/19   Pincus Sanes, MD  HYDROcodone-acetaminophen (NORCO) 7.5-325 MG tablet Take 1 tablet by mouth every 4 (four) hours as needed for moderate pain. 06/27/19   Pincus Sanes, MD  omeprazole (PRILOSEC) 40 MG capsule Take 1 capsule (40 mg total) by mouth daily. Patient needs office visit for further refills 06/11/19   Hilarie Fredrickson, MD  potassium chloride SA (K-DUR) 20 MEQ tablet TAKE 1 TABLET(20 MEQ) BY MOUTH DAILY Patient taking differently: Take 20 mEq by mouth daily.  11/12/18   Pincus Sanes, MD    Allergies    Patient has no known allergies.  Review of Systems   Review of Systems  All other systems reviewed and are negative.   Physical Exam Updated Vital Signs BP 111/69   Pulse (!) 105   Temp 98.1 F (36.7 C) (Oral)   Resp 19   Ht 1.626 m (5\' 4" )   Wt 42.2 kg   SpO2 93%   BMI 15.96 kg/m   Physical Exam Vitals and nursing note reviewed.  Constitutional:      Appearance: She is well-developed. She is not toxic-appearing or diaphoretic.  HENT:     Head: Normocephalic and atraumatic.     Right Ear: External ear normal.     Left Ear: External ear normal.  Eyes:     General: No scleral icterus.       Right eye: No discharge.        Left eye: No discharge.     Conjunctiva/sclera: Conjunctivae normal.  Neck:     Trachea: No tracheal deviation.  Cardiovascular:     Rate and Rhythm: Normal rate and regular  rhythm.  Pulmonary:     Effort: Pulmonary effort is normal. No respiratory distress.     Breath sounds: Normal breath sounds. No stridor. No wheezing or rales.  Abdominal:     General: Bowel sounds are normal. There is no distension.     Palpations: Abdomen is soft.     Tenderness: There is no abdominal tenderness. There is no  guarding or rebound.  Musculoskeletal:        General: Tenderness present.     Cervical back: Neck supple.     Lumbar back: Tenderness present. No swelling or deformity. Decreased range of motion.  Skin:    General: Skin is warm and dry.     Findings: No rash.  Neurological:     Mental Status: She is alert.     Cranial Nerves: No cranial nerve deficit (no facial droop, extraocular movements intact, no slurred speech).     Sensory: No sensory deficit.     Motor: No abnormal muscle tone or seizure activity.     Coordination: Coordination normal.     ED Results / Procedures / Treatments   Labs (all labs ordered are listed, but only abnormal results are displayed) Labs Reviewed  CBC WITH DIFFERENTIAL/PLATELET - Abnormal; Notable for the following components:      Result Value   RBC 3.66 (*)    Hemoglobin 10.1 (*)    HCT 30.2 (*)    All other components within normal limits  BASIC METABOLIC PANEL - Abnormal; Notable for the following components:   Sodium 134 (*)    Potassium 3.3 (*)    Glucose, Bld 109 (*)    BUN 52 (*)    Creatinine, Ser 1.17 (*)    GFR calc non Af Amer 44 (*)    GFR calc Af Amer 51 (*)    All other components within normal limits  SARS CORONAVIRUS 2 (TAT 6-24 HRS)  URINALYSIS, ROUTINE W REFLEX MICROSCOPIC  POC OCCULT BLOOD, ED    EKG None  Radiology DG Lumbar Spine Complete  Result Date: 07/05/2019 CLINICAL DATA:  Back pain. Patient reports pain for months. EXAM: LUMBAR SPINE - COMPLETE 4+ VIEW COMPARISON:  Radiograph 3 days ago 07/02/2019. Reformats from abdominal CT 12/21/2019 FINDINGS: Levo scoliotic curvature of the lumbar  spine of 40 degrees. Mild left lateral translation of L4 on L5, degenerative. Diffuse degenerative disc disease and facet hypertrophy. Vertebral body heights are preserved. No acute or compression fracture. The bones are under mineralized. Calcified aortic atherosclerosis. Aortic aneurysm demonstrated on prior CTA. IMPRESSION: Scoliosis and multilevel degenerative change in the lumbar spine. No evidence of acute abnormality. Electronically Signed   By: Keith Rake M.D.   On: 07/05/2019 16:39    Procedures Procedures (including critical care time)  Medications Ordered in ED Medications  sodium chloride 0.9 % bolus 500 mL (500 mLs Intravenous New Bag/Given 07/05/19 1754)    Followed by  0.9 %  sodium chloride infusion (1,000 mLs Intravenous New Bag/Given 07/05/19 1753)  HYDROmorphone (DILAUDID) injection 1 mg (1 mg Intravenous Given 07/05/19 1612)    ED Course  I have reviewed the triage vital signs and the nursing notes.  Pertinent labs & imaging results that were available during my care of the patient were reviewed by me and considered in my medical decision making (see chart for details).  Clinical Course as of Jul 05 1822  Sat Jul 05, 2019  1555 Medical records reviewed.  Patient has seen orthopedics.  She has also been evaluated by Dr. Doren Custard    [JK]  1618 Hgb slightly lower than previous   [JK]  1720 BUN and creatinine elevated compared to recent   [JK]  1721 Pt states pain is better.  She attempted to ambulate but was unable to do so.  Able to stand but could not make it to the bathroom   [JK]  1817 Clarified "MRI  test" pt had.  No results in the system.  Pt describes sliding on a table.  I believe she is referring to the plain films she had at the orthopedist office   [JK]    Clinical Course User Index [JK] Linwood Dibbles, MD   MDM Rules/Calculators/A&P                      Patient presents to ED with complaints of severe back pain.  She has been having increasing difficulty  to sleep.  Patient has multiple medical problems and was diagnosed with abdominal aortic aneurysm.  Initially there was concern this could be the source of her pain.  Dr Durwin Nora discussed surgical options with patient but she is extremely high risk, pt is not interested.  She has severe scoliosis and DDD.  Pt states she had an MRI recently, I do not have access to that information.  Suspect his is related to her spine rather than the aneurysm.    Xrays shows severe DDD and scoliosis.  Pt's pain is better after meds but she still is unable to ambulate.  Pt had an outpt mri but those results are not available.  Pt is unable to go home.  I will consult medical service for admission.  Would benefit from PT and OT eval.  Consult ortho to obtain mri results and recommendations   Final Clinical Impression(s) / ED Diagnoses Final diagnoses:  Chronic low back pain without sciatica, unspecified back pain laterality      Linwood Dibbles, MD 07/05/19 Mallie Snooks    Linwood Dibbles, MD 07/05/19 [phone]

## 2019-07-05 NOTE — ED Notes (Signed)
Pt required wheelchair assistance out of vehicle. Pt's brother and sister brought pt to ED stating " We just cant take care of her anymore, she was seen here last week but they didn't admit her we found her on her floor this morning we just cant do it anymore".   Pt A/OX4 Pt denied new onset head neck or back pain and reports "falling out of her bed this morning"

## 2019-07-05 NOTE — ED Notes (Signed)
Pt ambulated in room. Pt was able to take a couple of unsteady steps, but could not continue further. Pt was then assisted back into bed.

## 2019-07-05 NOTE — ED Notes (Addendum)
Pt provided cup of water, pop-sickle, saltine crackers.

## 2019-07-05 NOTE — H&P (Signed)
History and Physical   Jennifer [name] VPX:[id number] DOB: February 25, 1941 DOA: 07/05/2019  Referring MD/NP/PA: Dr. Dorie Rank  PCP: Binnie Rail, MD   Outpatient Specialists: Dr. Deitra Mayo, vascular surgery  Patient coming from: Home  Chief Complaint: Back pain  HPI: Jennifer [name] is a 79 y.o. female with medical history significant of hypertension, depression, GERD, hyperlipidemia, peripheral vascular disease recently diagnosed AAA followed by vascular surgery Dr. Scot Dock who was given her second Covid vaccine early in February and since then she has been sick she has been having significant low back pain nausea vomiting or diarrhea.  Patient came today however with poor appetite severe low back pain unable to lay back.  Pain is 10 out of 10 worse with any movement.  She was sent to orthopedic surgery where she had CT done with subsequent referral back to vascular surgeon 3  days ago.  She was referred back to orthopedic surgery by vascular surgery to continue evaluation but pain is unbearable so patient came to the ER.  She is moving in bed rolling in severe pain.  Initial pain management in the ER was inadequate.  She has been admitted to the hospital for pain control.  Patient denied any dizziness.  Denied any trauma.  She does have abdominal pain that sometimes radiates to the back.  She is being admitted mainly for pain management and further evaluation. .  ED Course: Temperature 98.2, blood pressure 130/59 pulse 121 respirate of 19 oxygen sat 89% on room air.  Sodium 134 potassium 3.3: 98 CO2 23 glucose 109.  BUN is 52 and creatinine 1.17.  White count 9.7 hemoglobin 10.9 platelet 331.  COVID-19 screen currently pending.  Fecal occult blood test is negative x3.  X-ray of the lumbar region showed scoliosis and multilevel degenerative change no evidence of acute abnormalities.  Patient being admitted mainly for pain management  Review of Systems: As per HPI otherwise 10 point review  of systems negative.    Past Medical History:  Diagnosis Date  . ANEMIA-NOS   . Cataract    right cataract removed  . Clotting disorder (HCC)    Pletal  . Depression    no longer taking meds/ resolved  . DYSLIPIDEMIA   . GERD (gastroesophageal reflux disease)   . Hemorrhoids   . HYPERTENSION   . Negative colorectal cancer screening using Cologuard test   . Osteoporosis, senile    DEXA 03/2014: -3.3 R fem  . Peripheral vascular disease, unspecified ([place]) dx 02/2010 ABI  . SMOKER     Past Surgical History:  Procedure Laterality Date  . CATARACT EXTRACTION W/ INTRAOCULAR LENS IMPLANT Right 03/12/14   groat  . HEMORRHOID SURGERY  05/18/2011   Procedure: HEMORRHOIDECTOMY;  Surgeon: Merrie Roof, MD;  Location: [facility] ORS;  Service: General;  Laterality: N/A;  hemorrhoidectomy, three columns, internal and external  . TONSILLECTOMY  1946  . TUBAL LIGATION  1979  . UPPER GASTROINTESTINAL ENDOSCOPY  01/03/2018     reports that she has been smoking cigarettes. She has a 31.00 pack-year smoking history. She has never used smokeless tobacco. She reports current alcohol use. She reports that she does not use drugs.  No Known Allergies  Family History  Problem Relation Age of Onset  . Breast cancer Mother   . Lung cancer Father   . Hypertension Brother   . Colon cancer Neg Hx   . Esophageal cancer Neg Hx   . Stomach cancer Neg Hx   .  Rectal cancer Neg Hx      Prior to Admission medications   Medication Sig Start Date End Date Taking? Authorizing Provider  ALPRAZolam (XANAX) 0.25 MG tablet TAKE 1 TABLET BY MOUTH EVERY NIGHT AT BEDTIME AS NEEDED FOR ANXIETY OR SLEEP Patient taking differently: Take 0.25 mg by mouth at bedtime as needed for anxiety or sleep.  02/05/19   Burns, Bobette Mo, MD  amLODipine (NORVASC) 5 MG tablet TAKE 1 TABLET BY MOUTH EVERY NIGHT AT BEDTIME Patient taking differently: Take 5 mg by mouth daily.  06/03/19   Pincus Sanes, MD  aspirin 81 MG tablet Take 81  mg by mouth daily before breakfast.     [provider]  atorvastatin (LIPITOR) 20 MG tablet TAKE 1 TABLET BY MOUTH EVERY DAY Patient taking differently: Take 20 mg by mouth daily at 6 PM.  05/05/19   Burns, Bobette Mo, MD  calcium-vitamin D (OSCAL WITH D) 500-200 MG-UNIT per tablet Take 2 tablets by mouth daily.    [provider]  cilostazol (PLETAL) 50 MG tablet Take 1 tablet (50 mg total) by mouth 2 (two) times daily. 06/11/19   Pincus Sanes, MD  Diclofenac Sodium 1 % CREA Apply 1 application topically 2 (two) times daily. 06/03/19   Felicie Morn, NP  gabapentin (NEURONTIN) 100 MG capsule Take 1-3 capsules (100-300 mg total) by mouth at bedtime. 06/27/19   Pincus Sanes, MD  HYDROcodone-acetaminophen (NORCO) 7.5-325 MG tablet Take 1 tablet by mouth every 4 (four) hours as needed for moderate pain. 06/27/19   Pincus Sanes, MD  omeprazole (PRILOSEC) 40 MG capsule Take 1 capsule (40 mg total) by mouth daily. Patient needs office visit for further refills 06/11/19   Hilarie Fredrickson, MD  potassium chloride SA (K-DUR) 20 MEQ tablet TAKE 1 TABLET(20 MEQ) BY MOUTH DAILY Patient taking differently: Take 20 mEq by mouth daily.  11/12/18   Pincus Sanes, MD    Physical Exam: Vitals:   07/05/19 1436 07/05/19 1440 07/05/19 1545  BP:  127/66 111/69  Pulse:  78 (!) 105  Resp:  19   Temp:  98.1 F (36.7 C)   TempSrc:  Oral   SpO2:   93%  Weight: 42.2 kg    Height: 5\' 4"  (1.626 m)        Constitutional: Chronically ill looking, frail, in mild to moderate distress Vitals:   07/05/19 1436 07/05/19 1440 07/05/19 1545  BP:  127/66 111/69  Pulse:  78 (!) 105  Resp:  19   Temp:  98.1 F (36.7 C)   TempSrc:  Oral   SpO2:   93%  Weight: 42.2 kg    Height: 5\' 4"  (1.626 m)     Eyes: PERRL, lids and conjunctivae normal ENMT: Mucous membranes are moist. Posterior pharynx clear of any exudate or lesions.Normal dentition.  Neck: normal, supple, no masses, no thyromegaly Respiratory: clear  to auscultation bilaterally, no wheezing, no crackles. Normal respiratory effort. No accessory muscle use.  Cardiovascular: Regular rate and rhythm, no murmurs / rubs / gallops. No extremity edema. 2+ pedal pulses. No carotid bruits.  Abdomen: no tenderness, no masses palpated. No hepatosplenomegaly. Bowel sounds positive.  Musculoskeletal: no clubbing / cyanosis. No joint deformity upper and lower extremities. Good ROM, no contractures. Normal muscle tone.  Lower back tenderness, loss of muscle mass Skin: no rashes, lesions, ulcers. No induration Neurologic: CN 2-12 grossly intact. Sensation intact, DTR normal. Strength 5/5 in all 4.  Psychiatric: Normal judgment  and insight. Alert and oriented x 3.  Patient very anxious moving in bed due to pain    Labs on Admission: I have personally reviewed following labs and imaging studies  CBC: Recent Labs  Lab 07/05/19 1604  WBC 9.7  NEUTROABS 7.7  HGB 10.1*  HCT 30.2*  MCV 82.5  PLT 331   Basic Metabolic Panel: Recent Labs  Lab 07/05/19 1604  NA 134*  K 3.3*  CL 98  CO2 23  GLUCOSE 109*  BUN 52*  CREATININE 1.17*  CALCIUM 9.4   GFR: Estimated Creatinine Clearance: 25.5 mL/min (A) (by C-G formula based on SCr of 1.17 mg/dL (H)). Liver Function Tests: No results for input(s): AST, ALT, ALKPHOS, BILITOT, PROT, ALBUMIN in the last 168 hours. No results for input(s): LIPASE, AMYLASE in the last 168 hours. No results for input(s): AMMONIA in the last 168 hours. Coagulation Profile: No results for input(s): INR, PROTIME in the last 168 hours. Cardiac Enzymes: No results for input(s): CKTOTAL, CKMB, CKMBINDEX, TROPONINI in the last 168 hours. BNP (last 3 results) No results for input(s): PROBNP in the last 8760 hours. HbA1C: No results for input(s): HGBA1C in the last 72 hours. CBG: No results for input(s): GLUCAP in the last 168 hours. Lipid Profile: No results for input(s): CHOL, HDL, LDLCALC, TRIG, CHOLHDL, LDLDIRECT in the  last 72 hours. Thyroid Function Tests: No results for input(s): TSH, T4TOTAL, FREET4, T3FREE, THYROIDAB in the last 72 hours. Anemia Panel: No results for input(s): VITAMINB12, FOLATE, FERRITIN, TIBC, IRON, RETICCTPCT in the last 72 hours. Urine analysis:    Component Value Date/Time   COLORURINE YELLOW 04/21/2015 1135   APPEARANCEUR CLEAR 04/21/2015 1135   LABSPEC <=1.005 (A) 04/21/2015 1135   PHURINE 6.5 04/21/2015 1135   GLUCOSEU NEGATIVE 04/21/2015 1135   HGBUR LARGE (A) 04/21/2015 1135   BILIRUBINUR NEGATIVE 04/21/2015 1135   KETONESUR NEGATIVE 04/21/2015 1135   UROBILINOGEN 0.2 04/21/2015 1135   NITRITE NEGATIVE 04/21/2015 1135   LEUKOCYTESUR MODERATE (A) 04/21/2015 1135   Sepsis Labs: @LABRCNTIP (procalcitonin:4,lacticidven:4) )No results found for this or any previous visit (from the past 240 hour(s)).   Radiological Exams on Admission: DG Lumbar Spine Complete  Result Date: 07/05/2019 CLINICAL DATA:  Back pain. Patient reports pain for months. EXAM: LUMBAR SPINE - COMPLETE 4+ VIEW COMPARISON:  Radiograph 3 days ago 07/02/2019. Reformats from abdominal CT 12/21/2019 FINDINGS: Levo scoliotic curvature of the lumbar spine of 40 degrees. Mild left lateral translation of L4 on L5, degenerative. Diffuse degenerative disc disease and facet hypertrophy. Vertebral body heights are preserved. No acute or compression fracture. The bones are under mineralized. Calcified aortic atherosclerosis. Aortic aneurysm demonstrated on prior CTA. IMPRESSION: Scoliosis and multilevel degenerative change in the lumbar spine. No evidence of acute abnormality. Electronically Signed   By: 12/23/2019 M.D.   On: 07/05/2019 16:39      Assessment/Plan Principal Problem:   Back pain Active Problems:   Dyslipidemia   Tobacco abuse   Essential hypertension   PAD (peripheral artery disease) (HCC)   Anxiety state   Abdominal aortic aneurysm (AAA) (HCC)     #1 low back pain: Suspected  degenerative disc disease with possible nerve impingement.  MRI will be ordered.  Admit for pain management.  Subsequent physical therapy and Occupational Therapy.  #2 essential hypertension: Home regimen.  Blood pressure currently stable.  #3 abdominal aortic aneurysm: Followed by vascular surgery.  #4 anxiety disorder: Resume home management with alprazolam.  #5 tobacco abuse: Nicotine patch and supportive  care  #6 hyperlipidemia: Continue statin  #7 peripheral vascular disease: Continue home regimen of Pletal  #8 hypokalemia: Replete potassium   DVT prophylaxis: Lovenox Code Status: Full code Family Communication: No family at bedside Disposition Plan: To be determined Consults called: None Admission status: Inpatient  Severity of Illness: The appropriate patient status for this patient is INPATIENT. Inpatient status is judged to be reasonable and necessary in order to provide the required intensity of service to ensure the patient's safety. The patient's presenting symptoms, physical exam findings, and initial radiographic and laboratory data in the context of their chronic comorbidities is felt to place them at high risk for further clinical deterioration. Furthermore, it is not anticipated that the patient will be medically stable for discharge from the hospital within 2 midnights of admission. The following factors support the patient status of inpatient.   " The patient's presenting symptoms include low back pain. " The worrisome physical exam findings include frail and tender back. " The initial radiographic and laboratory data are worrisome because of none yet. " The chronic co-morbidities include hypertension and AAA.   * I certify that at the point of admission it is my clinical judgment that the patient will require inpatient hospital care spanning beyond 2 midnights from the point of admission due to high intensity of service, high risk for further deterioration and  high frequency of surveillance required.Lonia Blood MD [facility] Pager [phone]  If 7PM-7AM, please contact night-coverage [email or web address] Password [facility]  07/05/2019, 6:41 PM

## 2019-07-05 NOTE — ED Triage Notes (Signed)
Per patient, she has lots of problem. Patient states she was diagnosed with AAA a week ago. Patient says she has since developed back pain rated 10/10. Patient says she is unable to complete any ADL's. Patient's brother and sister brought patient to ED stating they just cannot take care of her anymore. Patient states siblings will return to pick her up upon d/c.

## 2019-07-05 NOTE — ED Notes (Signed)
Pt provided peri-care, purewick applied, sheets changed.

## 2019-07-06 ENCOUNTER — Other Ambulatory Visit: Payer: Self-pay

## 2019-07-06 ENCOUNTER — Encounter (HOSPITAL_COMMUNITY): Payer: Self-pay | Admitting: Internal Medicine

## 2019-07-06 ENCOUNTER — Inpatient Hospital Stay (HOSPITAL_COMMUNITY): Payer: Medicare Other

## 2019-07-06 DIAGNOSIS — G8929 Other chronic pain: Secondary | ICD-10-CM

## 2019-07-06 LAB — COMPREHENSIVE METABOLIC PANEL
ALT: 14 U/L (ref 0–44)
AST: 33 U/L (ref 15–41)
Albumin: 2.6 g/dL — ABNORMAL LOW (ref 3.5–5.0)
Alkaline Phosphatase: 69 U/L (ref 38–126)
Anion gap: 10 (ref 5–15)
BUN: 37 mg/dL — ABNORMAL HIGH (ref 8–23)
CO2: 23 mmol/L (ref 22–32)
Calcium: 8.4 mg/dL — ABNORMAL LOW (ref 8.9–10.3)
Chloride: 105 mmol/L (ref 98–111)
Creatinine, Ser: 0.95 mg/dL (ref 0.44–1.00)
GFR calc Af Amer: >60 mL/min (ref >60)
GFR calc non Af Amer: 57 mL/min — ABNORMAL LOW (ref >60)
Glucose, Bld: 89 mg/dL (ref 70–99)
Potassium: 3.4 mmol/L — ABNORMAL LOW (ref 3.5–5.1)
Sodium: 138 mmol/L (ref 135–145)
Total Bilirubin: 0.5 mg/dL (ref 0.3–1.2)
Total Protein: 6.1 g/dL — ABNORMAL LOW (ref 6.5–8.1)

## 2019-07-06 LAB — URINALYSIS, ROUTINE W REFLEX MICROSCOPIC
Bacteria, UA: NONE SEEN
Bilirubin Urine: NEGATIVE
Glucose, UA: NEGATIVE mg/dL
Ketones, ur: NEGATIVE mg/dL
Leukocytes,Ua: NEGATIVE
Nitrite: NEGATIVE
Protein, ur: NEGATIVE mg/dL
Specific Gravity, Urine: 1.013 (ref 1.005–1.030)
pH: 5 (ref 5.0–8.0)

## 2019-07-06 LAB — CBC
HCT: 28.2 % — ABNORMAL LOW (ref 36.0–46.0)
Hemoglobin: 9 g/dL — ABNORMAL LOW (ref 12.0–15.0)
MCH: 27.1 pg (ref 26.0–34.0)
MCHC: 31.9 g/dL (ref 30.0–36.0)
MCV: 84.9 fL (ref 80.0–100.0)
Platelets: 290 10*3/uL (ref 150–400)
RBC: 3.32 MIL/uL — ABNORMAL LOW (ref 3.87–5.11)
RDW: 14.7 % (ref 11.5–15.5)
WBC: 7.5 10*3/uL (ref 4.0–10.5)
nRBC: 0 % (ref 0.0–0.2)

## 2019-07-06 LAB — SARS CORONAVIRUS 2 (TAT 6-24 HRS): SARS Coronavirus 2: NEGATIVE

## 2019-07-06 IMAGING — MR MR LUMBAR SPINE WO/W CM
6 of 7 series · 32 of 48 positions shown · IV contrast (gadavist)
Comparison: None similar

CLINICAL DATA: Low back pain for several weeks

EXAM:
MRI LUMBAR SPINE WITHOUT AND WITH CONTRAST
TECHNIQUE: Multiplanar and multiecho pulse sequences of the lumbar spine were
obtained without and with intravenous contrast.
CONTRAST:  5mL GADAVIST GADOBUTROL 1 MMOL/ML IV SOLN

[Series 9: T1 · sagittal · 4.0mm · 1.02mm/px · 5 of 17 slices shown (1 of 2)]
[im 1/17]
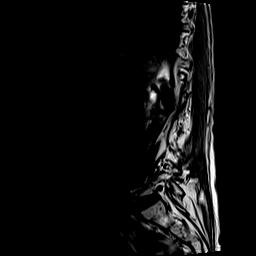
[im 5/17]
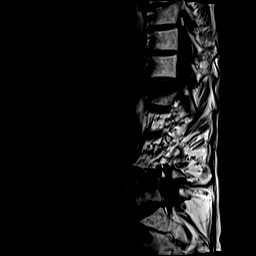
[im 9/17]
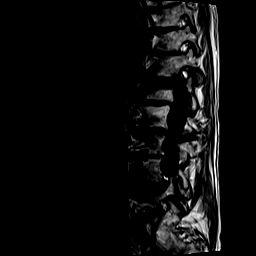
[im 13/17]
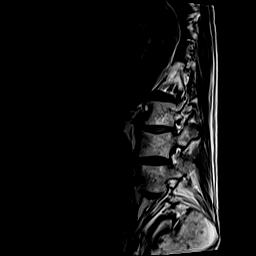
[im 17/17]
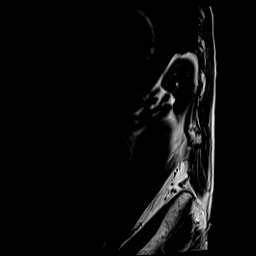

[Series 10: STIR · sagittal · 4.0mm · 0.51mm/px · 3 of 17 slices shown]
[im 1/17]
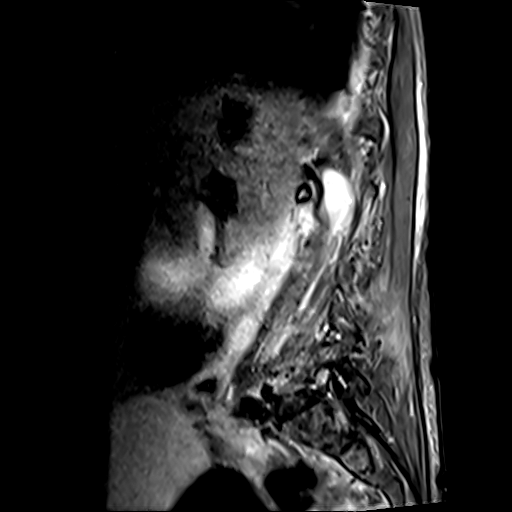
[im 5/17]
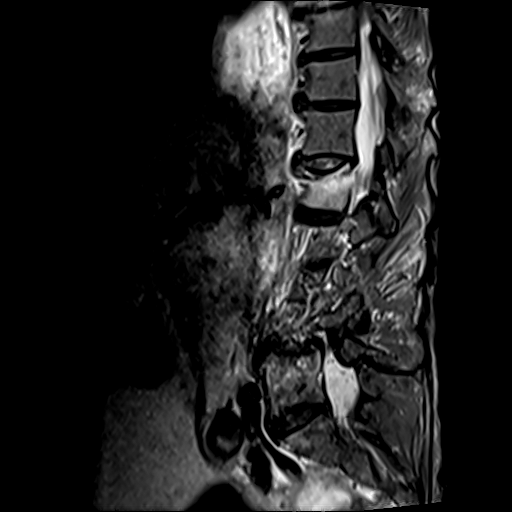
[im 9/17]
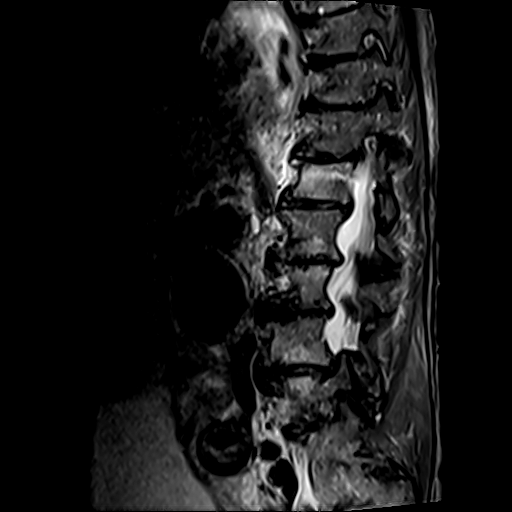

[Series 11: T2 · axial · 4.0mm · 0.78mm/px · z∈[-114,+71]mm · 8 of 38 slices shown]
[im 1/38]
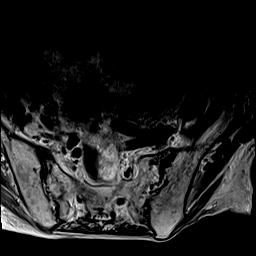
[im 5/38]
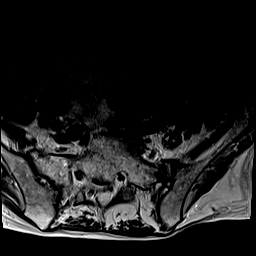
[im 13/38]
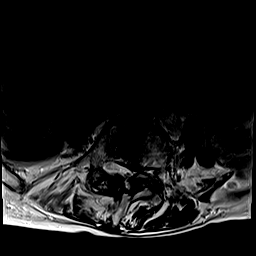
[im 17/38]
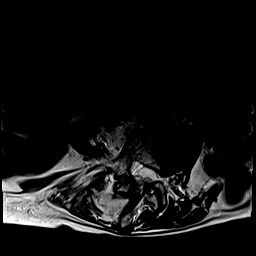
[im 21/38]
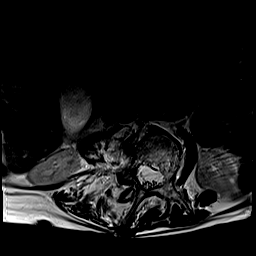
[im 25/38]
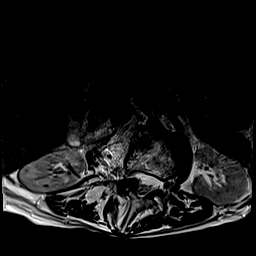
[im 33/38]
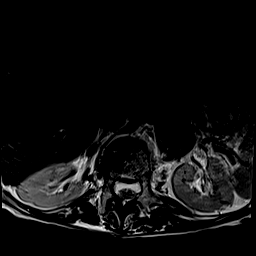
[im 38/38]
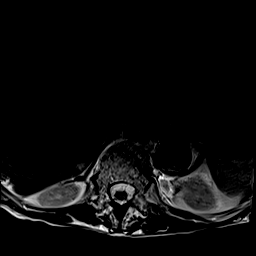

[Series 12: T1 · axial · 4.0mm · 0.43mm/px · z∈[-114,+71]mm · 8 of 38 slices shown (2 of 2)]
[im 1/38]
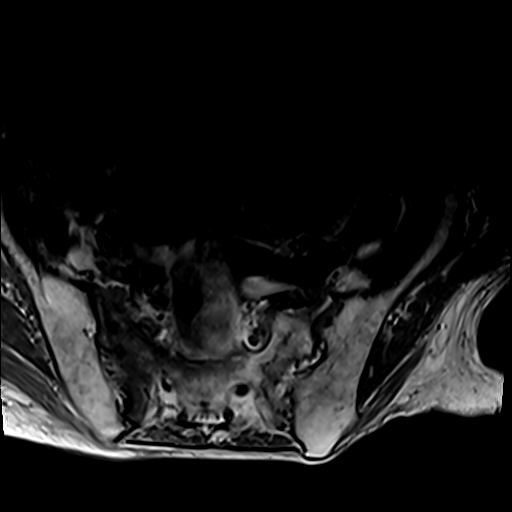
[im 5/38]
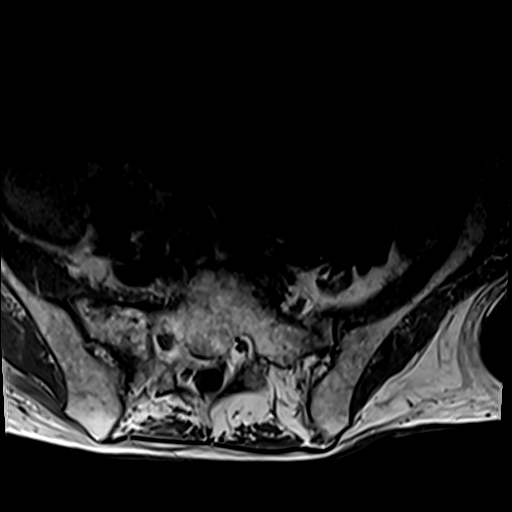
[im 13/38]
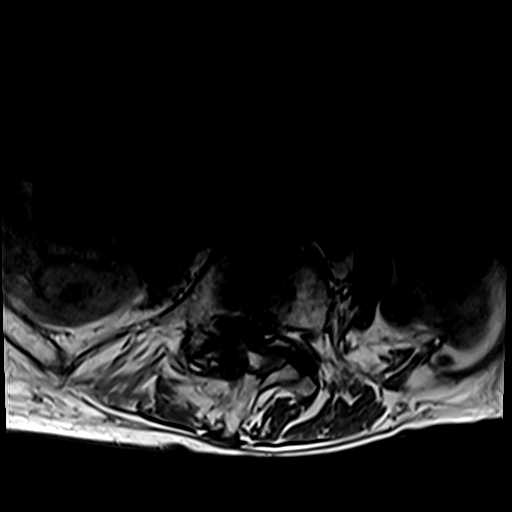
[im 17/38]
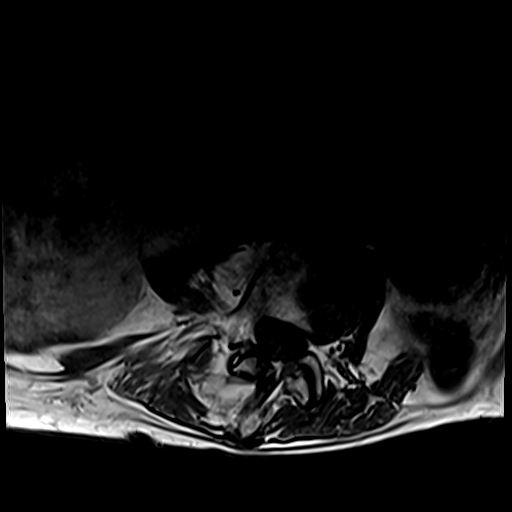
[im 21/38]
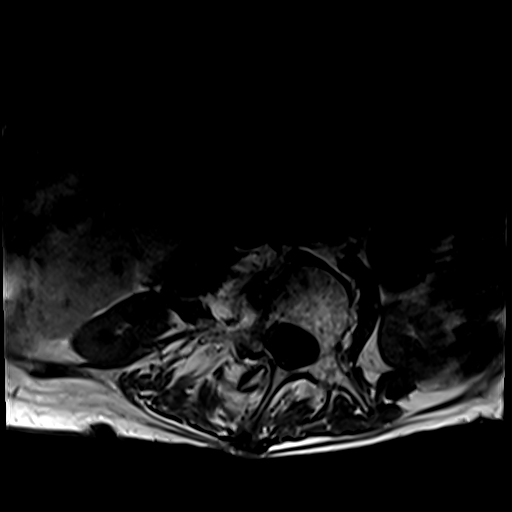
[im 25/38]
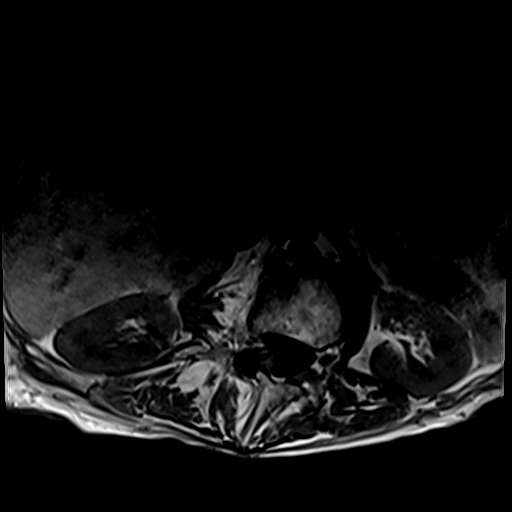
[im 33/38]
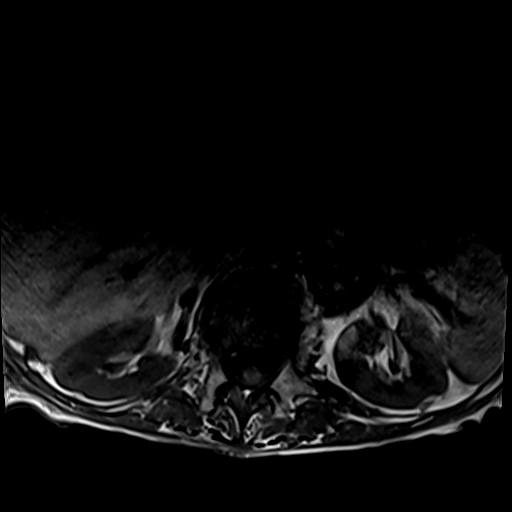
[im 38/38]
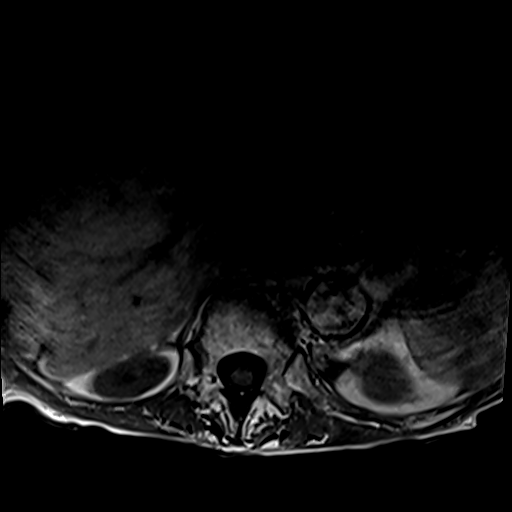

[Series 13: T2 post-contrast · sagittal · 4.0mm · 0.81mm/px · 4 of 17 slices shown]
[im 1/17]
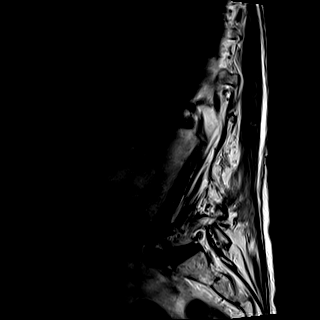
[im 6/17]
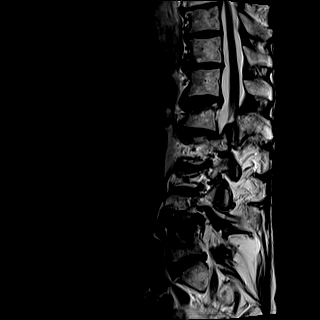
[im 11/17]
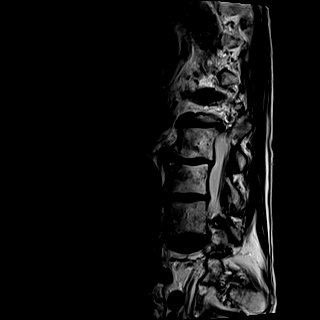
[im 17/17]
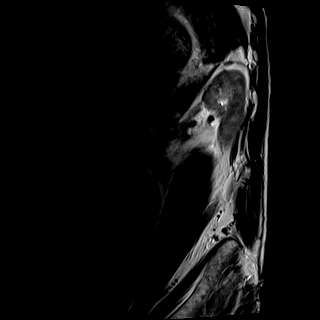

[Series 14: T1 fat-sat post-contrast · sagittal · 4.0mm · 0.81mm/px · 4 of 17 slices shown]
[im 1/17]
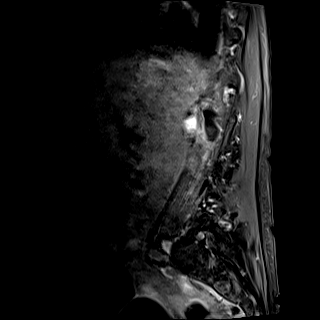
[im 6/17]
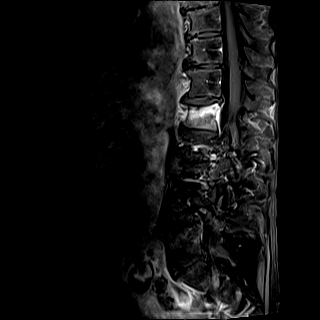
[im 11/17]
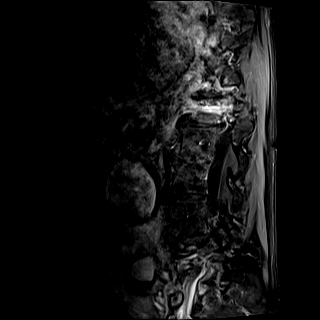
[im 17/17]
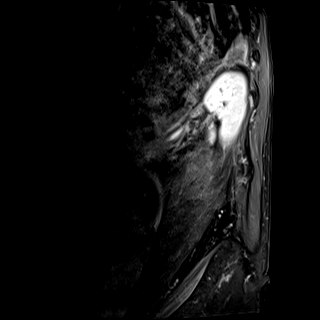

[32 of 48 positions shown; findings below may reference images not displayed]

FINDINGS: Segmentation:  5 lumbar type vertebrae

Alignment: Marked levoscoliosis with Cobb angle measured on
preceding radiography.

Vertebrae: There is a horizontal fracture plane through the L1
superior endplate with marrow edema and enhancement consistent with
recent injury. No evidence of underlying mass. Height loss measures
up to 25%. Negative for retropulsion or posterior element
involvement.

Best seen on T1 weighted imaging there is bilateral vertical sacral
insufficiency.

Conus medullaris and cauda equina: Conus extends to the L1 level.
Conus and cauda equina appear normal.

Paraspinal and other soft tissues: Very tortuous abdominal aorta
with aneurysm and left-sided mural thrombus. The aorta was evaluated
by CTA last month.

T2 hyperintensity in the central right liver correlating with
hemangioma on CT.

Large hiatal hernia.

Disc levels:

T12- L1: Disc narrowing and asymmetric left-sided height loss with
bulging. No neural impingement

L1-L2: Disc narrowing and bulging with endplate spurring. No
impingement

L2-L3: Disc narrowing with asymmetric right-sided collapse and
spurring. Noncompressive right foraminal narrowing. Patent spinal
canal

L3-L4: Disc narrowing and endplate degeneration with right
far-lateral spurring. Mild facet spurring.

L4-L5: Disc narrowing and bulging. Degenerative facet spurring.
Moderate spinal stenosis with bilateral subarticular recess
narrowing that could affect either L5 nerve root. No foraminal
compression

L5-S1:Disc narrowing with asymmetric left-sided height loss and
bulging. Degenerative facet spurring asymmetric to the left. There
is left foraminal impingement. Patent spinal canal.
IMPRESSION: 1. L1 acute or subacute compression fracture with 25% height loss.
2. Acute or subacute bilateral sacral ala insufficiency fracture.
3. Diffuse degenerative disease with scoliosis.
4. L4-5 moderate spinal stenosis. Either L5 nerve root could be
affected in the subarticular recesses.
5. L5-S1 left foraminal impingement.

## 2019-07-06 MED ORDER — DOCUSATE SODIUM 100 MG PO CAPS
200.0000 mg | ORAL_CAPSULE | Freq: Every day | ORAL | Status: DC
  Administered 2019-07-06 – 2019-07-17 (×10): 200 mg via ORAL
  Filled 2019-07-06 (×12): qty 2

## 2019-07-06 MED ORDER — CILOSTAZOL 100 MG PO TABS
50.0000 mg | ORAL_TABLET | Freq: Two times a day (BID) | ORAL | Status: DC
  Administered 2019-07-06 – 2019-07-07 (×3): 50 mg via ORAL
  Filled 2019-07-06 (×2): qty 0.5
  Filled 2019-07-06: qty 1
  Filled 2019-07-06: qty 0.5

## 2019-07-06 MED ORDER — AMLODIPINE BESYLATE 5 MG PO TABS
5.0000 mg | ORAL_TABLET | Freq: Every day | ORAL | Status: DC
  Administered 2019-07-06: 5 mg via ORAL
  Filled 2019-07-06: qty 1

## 2019-07-06 MED ORDER — POTASSIUM CHLORIDE CRYS ER 20 MEQ PO TBCR
20.0000 meq | EXTENDED_RELEASE_TABLET | Freq: Every day | ORAL | Status: DC
  Administered 2019-07-06 – 2019-07-09 (×4): 20 meq via ORAL
  Filled 2019-07-06 (×4): qty 1

## 2019-07-06 MED ORDER — ALPRAZOLAM 0.25 MG PO TABS: 0.2500 mg | ORAL_TABLET | Freq: Every evening | ORAL | Status: DC | PRN

## 2019-07-06 MED ORDER — CALCIUM CARBONATE-VITAMIN D 500-200 MG-UNIT PO TABS
2.0000 | ORAL_TABLET | Freq: Every day | ORAL | Status: DC
  Administered 2019-07-06 – 2019-07-17 (×12): 2 via ORAL
  Filled 2019-07-06 (×13): qty 2

## 2019-07-06 MED ORDER — ATORVASTATIN CALCIUM 10 MG PO TABS
20.0000 mg | ORAL_TABLET | Freq: Every day | ORAL | Status: DC
  Administered 2019-07-06 – 2019-07-17 (×12): 20 mg via ORAL
  Filled 2019-07-06 (×12): qty 2

## 2019-07-06 MED ORDER — GADOBUTROL 1 MMOL/ML IV SOLN
5.0000 mL | Freq: Once | INTRAVENOUS | Status: AC | PRN
  Administered 2019-07-06: 5 mL via INTRAVENOUS

## 2019-07-06 MED ORDER — PANTOPRAZOLE SODIUM 40 MG PO TBEC
40.0000 mg | DELAYED_RELEASE_TABLET | Freq: Every day | ORAL | Status: DC
  Administered 2019-07-06 – 2019-07-18 (×13): 40 mg via ORAL
  Filled 2019-07-06 (×13): qty 1

## 2019-07-06 MED ORDER — CYCLOBENZAPRINE HCL 5 MG PO TABS
5.0000 mg | ORAL_TABLET | Freq: Three times a day (TID) | ORAL | Status: DC | PRN
  Administered 2019-07-06 – 2019-07-16 (×4): 5 mg via ORAL
  Filled 2019-07-06 (×4): qty 1

## 2019-07-06 MED ORDER — HYDROCODONE-ACETAMINOPHEN 7.5-325 MG PO TABS
1.0000 | ORAL_TABLET | ORAL | Status: DC | PRN
  Administered 2019-07-06 – 2019-07-18 (×27): 1 via ORAL
  Filled 2019-07-06 (×28): qty 1

## 2019-07-06 MED ORDER — ASPIRIN EC 81 MG PO TBEC
81.0000 mg | DELAYED_RELEASE_TABLET | Freq: Every day | ORAL | Status: DC
  Administered 2019-07-06 – 2019-07-18 (×13): 81 mg via ORAL
  Filled 2019-07-06 (×13): qty 1

## 2019-07-06 MED ORDER — LIDOCAINE 5 % EX PTCH
1.0000 | MEDICATED_PATCH | CUTANEOUS | Status: DC
  Administered 2019-07-06 – 2019-07-17 (×12): 1 via TRANSDERMAL
  Filled 2019-07-06 (×14): qty 1

## 2019-07-06 MED ORDER — GABAPENTIN 100 MG PO CAPS
100.0000 mg | ORAL_CAPSULE | Freq: Every day | ORAL | Status: DC
  Administered 2019-07-06 – 2019-07-17 (×13): 100 mg via ORAL
  Filled 2019-07-06 (×13): qty 1

## 2019-07-06 MED ORDER — BISACODYL 5 MG PO TBEC
10.0000 mg | DELAYED_RELEASE_TABLET | Freq: Every day | ORAL | Status: DC
  Administered 2019-07-06 – 2019-07-17 (×9): 10 mg via ORAL
  Filled 2019-07-06 (×12): qty 2

## 2019-07-06 NOTE — Progress Notes (Signed)
PROGRESS NOTE    Jennifer [name]  OAC:[id number] DOB: 1940/05/14 DOA: 07/05/2019 PCP: Binnie Rail, MD    Brief Narrative: 79 y.o. female with medical history significant of hypertension, depression, GERD, hyperlipidemia, peripheral vascular disease recently diagnosed AAA followed by vascular surgery Dr. Scot Dock who was given her second Covid vaccine early in February and since then she has been sick she has been having significant low back pain nausea vomiting or diarrhea.  Patient came today however with poor appetite severe low back pain unable to lay back.  Pain is 10 out of 10 worse with any movement.  She was sent to orthopedic surgery where she had CT done with subsequent referral back to vascular surgeon 3  days ago.  She was referred back to orthopedic surgery by vascular surgery to continue evaluation but pain is unbearable so patient came to the ER.  She is moving in bed rolling in severe pain.  Initial pain management in the ER was inadequate.  She has been admitted to the hospital for pain control.  Patient denied any dizziness.  Denied any trauma.  She does have abdominal pain that sometimes radiates to the back.  She is being admitted mainly for pain management and further evaluation. .  ED Course: Temperature 98.2, blood pressure 130/59 pulse 121 respirate of 19 oxygen sat 89% on room air.  Sodium 134 potassium 3.3: 98 CO2 23 glucose 109.  BUN is 52 and creatinine 1.17.  White count 9.7 hemoglobin 10.9 platelet 331.  COVID-19 screen currently pending.  Fecal occult blood test is negative x3.  X-ray of the lumbar region showed scoliosis and multilevel degenerative change no evidence of acute abnormalities.  Patient being admitted mainly for pain management  Assessment & Plan:   Principal Problem:   Back pain Active Problems:   Dyslipidemia   Tobacco abuse   Essential hypertension   PAD (peripheral artery disease) (HCC)   Anxiety state   Abdominal aortic aneurysm (AAA)  (HCC)    #1 Severe DJD with scoliosis-patient admitted with severe low back pain 10 out of 10 unable to get out of bed or ambulate.  This morning she is still in excruciating pain she reports her pain is 8 out of 10.  She lives alone.  She has not gotten out of bed since she came to the hospital.  She received IV morphine for pain control.  MRI spine pending. Add bowel regime   #2 history of essential hypertension blood pressure today 117/77.  She is on Norvasc which I will hold.  #3 history of infrarenal abdominal aortic aneurysm 5.4 cm followed by vascular surgery patient saw Dr. Doren Custard on 07/02/2019 for the same.  Follow-up ultrasound in 6 months.  #4 history of anxiety disorder continue Xanax  #5 hyperlipidemia continue statin  #6 peripheral vascular disease continue Pletal  #7 history of tobacco abuse continue nicotine patch patient counseled   #8 chronic hypokalemia on potassium replacement at home continue.  Estimated body mass index is 15.96 kg/m as calculated from the following:   Height as of this encounter: 5\' 4"  (1.626 m).   Weight as of this encounter: 42.2 kg.  DVT prophylaxis: Lovenox Code Status: Full code  family Communication: None  disposition Plan: Patient came from home patient will likely discharge back home.  Barriers to discharge patient with significant back pain unable to get out of bed PT eval is pending receiving IV morphine and Flexeril.  RI of the spine pending.   Consultants:  None  Procedures: None Antimicrobials: None Subjective: She is resting in bed on her left side appears uncomfortable due to back discomfort  Objective: Thin frail elderly female in mild distress due to back pain Vitals:   07/05/19 2053 07/05/19 2214 07/06/19 0007 07/06/19 0526  BP: 121/73   117/77  Pulse: 86   82  Resp: 16   13  Temp: 98.2 F (36.8 C)   98.2 F (36.8 C)  TempSrc: Oral   Oral  SpO2: 96% 94% 97% 94%  Weight:      Height:        Intake/Output  Summary (Last 24 hours) at 07/06/2019 1025 Last data filed at 07/06/2019 0645 Gross per 24 hour  Intake 1458.33 ml  Output 100 ml  Net 1358.33 ml   Filed Weights   07/05/19 1436  Weight: 42.2 kg    Examination: No spinal tenderness noted. General exam: Appears calm and comfortable  Respiratory system: Clear to auscultation. Respiratory effort normal. Cardiovascular system: S1 & S2 heard, RRR. No JVD, murmurs, rubs, gallops or clicks. No pedal edema. Gastrointestinal system: Abdomen is nondistended, soft and nontender. No organomegaly or masses felt. Normal bowel sounds heard. Central nervous system: Alert and oriented. No focal neurological deficits. Extremities: Symmetric 5 x 5 power. Skin: No rashes, lesions or ulcers Psychiatry: Judgement and insight appear normal. Mood & affect appropriate.     Data Reviewed: I have personally reviewed following labs and imaging studies  CBC: Recent Labs  Lab 07/05/19 1604 07/06/19 0556  WBC 9.7 7.5  NEUTROABS 7.7  --   HGB 10.1* 9.0*  HCT 30.2* 28.2*  MCV 82.5 84.9  PLT 331 290   Basic Metabolic Panel: Recent Labs  Lab 07/05/19 1604 07/06/19 0556  NA 134* 138  K 3.3* 3.4*  CL 98 105  CO2 23 23  GLUCOSE 109* 89  BUN 52* 37*  CREATININE 1.17* 0.95  CALCIUM 9.4 8.4*   GFR: Estimated Creatinine Clearance: 31.5 mL/min (by C-G formula based on SCr of 0.95 mg/dL). Liver Function Tests: Recent Labs  Lab 07/06/19 0556  AST 33  ALT 14  ALKPHOS 69  BILITOT 0.5  PROT 6.1*  ALBUMIN 2.6*   No results for input(s): LIPASE, AMYLASE in the last 168 hours. No results for input(s): AMMONIA in the last 168 hours. Coagulation Profile: No results for input(s): INR, PROTIME in the last 168 hours. Cardiac Enzymes: No results for input(s): CKTOTAL, CKMB, CKMBINDEX, TROPONINI in the last 168 hours. BNP (last 3 results) No results for input(s): PROBNP in the last 8760 hours. HbA1C: No results for input(s): HGBA1C in the last 72  hours. CBG: No results for input(s): GLUCAP in the last 168 hours. Lipid Profile: No results for input(s): CHOL, HDL, LDLCALC, TRIG, CHOLHDL, LDLDIRECT in the last 72 hours. Thyroid Function Tests: No results for input(s): TSH, T4TOTAL, FREET4, T3FREE, THYROIDAB in the last 72 hours. Anemia Panel: No results for input(s): VITAMINB12, FOLATE, FERRITIN, TIBC, IRON, RETICCTPCT in the last 72 hours. Sepsis Labs: No results for input(s): PROCALCITON, LATICACIDVEN in the last 168 hours.  Recent Results (from the past 240 hour(s))  SARS CORONAVIRUS 2 (TAT 6-24 HRS) Nasopharyngeal Nasopharyngeal Swab     Status: None   Collection Time: 07/05/19  6:06 PM   Specimen: Nasopharyngeal Swab  Result Value Ref Range Status   SARS Coronavirus 2 NEGATIVE NEGATIVE Final    Comment: (NOTE) SARS-CoV-2 target nucleic acids are NOT DETECTED. The SARS-CoV-2 RNA is generally detectable in upper and lower  respiratory specimens during the acute phase of infection. Negative results do not preclude SARS-CoV-2 infection, do not rule out co-infections with other pathogens, and should not be used as the sole basis for treatment or other patient management decisions. Negative results must be combined with clinical observations, patient history, and epidemiological information. The expected result is Negative. Fact Sheet for Patients: HairSlick.no Fact Sheet for Healthcare Providers: quierodirigir.com This test is not yet approved or cleared by the [place] FDA and  has been authorized for detection and/or diagnosis of SARS-CoV-2 by FDA under an Emergency Use Authorization (EUA). This EUA will remain  in effect (meaning this test can be used) for the duration of the COVID-19 declaration under Section 56 4(b)(1) of the Act, 21 U.S.C. section 360bbb-3(b)(1), unless the authorization is terminated or revoked sooner. Performed at [facility],  [address] [place], [place] [address]          Radiology Studies: DG Lumbar Spine Complete  Result Date: 07/05/2019 CLINICAL DATA:  Back pain. Patient reports pain for months. EXAM: LUMBAR SPINE - COMPLETE 4+ VIEW COMPARISON:  Radiograph 3 days ago 07/02/2019. Reformats from abdominal CT 12/21/2019 FINDINGS: Levo scoliotic curvature of the lumbar spine of 40 degrees. Mild left lateral translation of L4 on L5, degenerative. Diffuse degenerative disc disease and facet hypertrophy. Vertebral body heights are preserved. No acute or compression fracture. The bones are under mineralized. Calcified aortic atherosclerosis. Aortic aneurysm demonstrated on prior CTA. IMPRESSION: Scoliosis and multilevel degenerative change in the lumbar spine. No evidence of acute abnormality. Electronically Signed   By: Narda Rutherford M.D.   On: 07/05/2019 16:39        Scheduled Meds: . amLODipine  5 mg Oral QHS  . aspirin EC  81 mg Oral QAC breakfast  . atorvastatin  20 mg Oral q1800  . calcium-vitamin D  2 tablet Oral Daily  . cilostazol  50 mg Oral BID  . enoxaparin (LOVENOX) injection  30 mg Subcutaneous Q24H  . gabapentin  100 mg Oral QHS  . pantoprazole  40 mg Oral Daily  . potassium chloride SA  20 mEq Oral Daily   Continuous Infusions: . sodium chloride 1,000 mL (07/05/19 2146)     LOS: 1 day     Alwyn Ren, MD 07/06/2019, 10:25 AM

## 2019-07-06 NOTE — Telephone Encounter (Signed)
Currently in hospital

## 2019-07-06 NOTE — Progress Notes (Signed)
Patient is experiencing difficulty swallowing medications, particularly Potassium and Oscal. Placed medication in applesauce. Patient was able to swallow medications without difficulty. Attending physician is aware.

## 2019-07-06 NOTE — Progress Notes (Signed)
PT Cancellation Note  Patient Details Name: Jennifer [name] MRN: [id number] DOB: 08/22/1940   Cancelled Treatment:    Reason Eval/Treat Not Completed: Pain limiting ability to participate;Medical issues which prohibited therapy(Will HOLD at least today- pending MRI. Pain 8/10 in back.Receiving morphine per IV) Harriett Rush, PT # [phone] CGV cell  Ephriam Jenkins 07/06/2019, 12:33 PM

## 2019-07-07 NOTE — Consult Note (Signed)
Chief Complaint: Lower back pain   Referring Physician(s): Dr.  Delight Hoh  Supervising Physician: Malachy Moan  Patient Status: [facility] - In-pt  History of Present Illness: Jennifer [name] is a 79 y.o. female. History of AAA (followed by vascular) presented to this facility with persistent lower back pain X 3 weeks unresolved with narcotic pain medication. Found to have acute or subacture compression fracture with height loss on L1 and cute or subacute bilateral sacral ala insufficiency fracture  Team is requesting evaluation for  kyphoplasty (L1) possible sacroplasty.  Patient denies any falls that correlate with back pain (patient does endorse a fall from bed approximately 1 week ago) Patient reports generalized diffuse lower back pain, paresthesia to the  lateral aspect of bilateral lower extremities and increase in difficulty with urination. Patient denies any decrease in sensation or pin point tenderness. Patient also denies any loss of bowel or bladder   Past Medical History:  Diagnosis Date  . ANEMIA-NOS   . Cataract    right cataract removed  . Clotting disorder (HCC)    Pletal  . Depression    no longer taking meds/ resolved  . DYSLIPIDEMIA   . GERD (gastroesophageal reflux disease)   . Hemorrhoids   . HYPERTENSION   . Negative colorectal cancer screening using Cologuard test   . Osteoporosis, senile    DEXA 03/2014: -3.3 R fem  . Peripheral vascular disease, unspecified (HCC) dx 02/2010 ABI  . SMOKER     Past Surgical History:  Procedure Laterality Date  . CATARACT EXTRACTION W/ INTRAOCULAR LENS IMPLANT Right 03/12/14   groat  . HEMORRHOID SURGERY  05/18/2011   Procedure: HEMORRHOIDECTOMY;  Surgeon: Robyne Askew, MD;  Location: [facility];  Service: General;  Laterality: N/A;  hemorrhoidectomy, three columns, internal and external  . TONSILLECTOMY  1946  . TUBAL LIGATION  1979  . UPPER GASTROINTESTINAL ENDOSCOPY  01/03/2018    Allergies: Patient has no  known allergies.  Medications: Prior to Admission medications   Medication Sig Start Date End Date Taking? Authorizing Provider  ALPRAZolam (XANAX) 0.25 MG tablet TAKE 1 TABLET BY MOUTH EVERY NIGHT AT BEDTIME AS NEEDED FOR ANXIETY OR SLEEP Patient taking differently: Take 0.25 mg by mouth at bedtime as needed for anxiety or sleep.  02/05/19  Yes Burns, Bobette Mo, MD  amLODipine (NORVASC) 5 MG tablet TAKE 1 TABLET BY MOUTH EVERY NIGHT AT BEDTIME Patient taking differently: Take 5 mg by mouth at bedtime.  06/03/19  Yes Burns, Bobette Mo, MD  aspirin 81 MG tablet Take 81 mg by mouth daily before breakfast.    Yes [provider]  atorvastatin (LIPITOR) 20 MG tablet TAKE 1 TABLET BY MOUTH EVERY DAY Patient taking differently: Take 20 mg by mouth daily at 6 PM.  05/05/19  Yes Burns, Bobette Mo, MD  calcium-vitamin D (OSCAL WITH D) 500-200 MG-UNIT per tablet Take 2 tablets by mouth daily.   Yes [provider]  cilostazol (PLETAL) 50 MG tablet Take 1 tablet (50 mg total) by mouth 2 (two) times daily. 06/11/19  Yes Burns, Bobette Mo, MD  gabapentin (NEURONTIN) 100 MG capsule Take 1-3 capsules (100-300 mg total) by mouth at bedtime. Patient taking differently: Take 100 mg by mouth at bedtime.  06/27/19  Yes Burns, Bobette Mo, MD  HYDROcodone-acetaminophen (NORCO) 7.5-325 MG tablet Take 1 tablet by mouth every 4 (four) hours as needed for moderate pain. 06/27/19  Yes Burns, Bobette Mo, MD  omeprazole (PRILOSEC) 40 MG capsule  Take 1 capsule (40 mg total) by mouth daily. Patient needs office visit for further refills 06/11/19  Yes Irene Shipper, MD  potassium chloride SA (K-DUR) 20 MEQ tablet TAKE 1 TABLET(20 MEQ) BY MOUTH DAILY Patient taking differently: Take 20 mEq by mouth daily.  11/12/18  Yes Burns, Claudina Lick, MD  Diclofenac Sodium 1 % CREA Apply 1 application topically 2 (two) times daily. Patient not taking: Reported on 07/05/2019 06/03/19   Etta Quill, NP     Family History  Problem Relation Age of Onset    . Breast cancer Mother   . Lung cancer Father   . Hypertension Brother   . Colon cancer Neg Hx   . Esophageal cancer Neg Hx   . Stomach cancer Neg Hx   . Rectal cancer Neg Hx     Social History   Socioeconomic History  . Marital status: Single    Spouse name: Not on file  . Number of children: Not on file  . Years of education: Not on file  . Highest education level: Not on file  Occupational History  . Occupation: retired  Tobacco Use  . Smoking status: Current Every Day Smoker    Packs/day: 0.50    Years: 62.00    Pack years: 31.00    Types: Cigarettes  . Smokeless tobacco: Never Used  Substance and Sexual Activity  . Alcohol use: Yes    Alcohol/week: 0.0 standard drinks    Comment: Social- 1 scotch, 1 glass wine weekly  . Drug use: No  . Sexual activity: Not Currently    Birth control/protection: Post-menopausal  Other Topics Concern  . 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   Social Determinants of Health   Financial Resource Strain: Low Risk   . Difficulty of Paying Living Expenses: Not hard at all  Food Insecurity: No Food Insecurity  . Worried About Charity fundraiser in the Last Year: Never true  . Ran Out of Food in the Last Year: Never true  Transportation Needs: No Transportation Needs  . Lack of Transportation (Medical): No  . Lack of Transportation (Non-Medical): No  Physical Activity: Sufficiently Active  . Days of Exercise per Week: 5 days  . Minutes of Exercise per Session: 30 min  Stress: No Stress Concern Present  . Feeling of Stress : Not at all  Social Connections: Unknown  . Frequency of Communication with Friends and Family: More than three times a week  . Frequency of Social Gatherings with Friends and Family: More than three times a week  . Attends Religious Services: 1 to 4 times per year  . Active Member of Clubs or Organizations: Yes  . Attends Theatre manager Meetings: More than 4 times per year  . Marital Status: Not on file    Review of Systems: A 12 point ROS discussed and pertinent positives are indicated in the HPI above.  All other systems are negative.  Review of Systems  Constitutional: Negative for fatigue and fever.  HENT: Negative for congestion.   Respiratory: Negative for cough and shortness of breath.   Gastrointestinal: Negative for abdominal pain, diarrhea, nausea and vomiting.  Genitourinary: Positive for difficulty urinating.  Musculoskeletal: Positive for back pain ( with tingling radiating down the outisde of bilateral legs).    Vital Signs: BP 129/80 (BP Location: Right Arm)   Pulse 80   Temp 98.2 F (36.8 C)  Resp 16   Ht 5\' 4"  (1.626 m)   Wt 93 lb (42.2 kg)   SpO2 98%   BMI 15.96 kg/m   Physical Exam Vitals and nursing note reviewed.  Constitutional:      Appearance: She is well-developed.  HENT:     Head: Normocephalic and atraumatic.  Eyes:     Conjunctiva/sclera: Conjunctivae normal.  Pulmonary:     Effort: Pulmonary effort is normal.  Musculoskeletal:        General: Tenderness ( generalized lower back pain no pin point tenderness. ) present. Normal range of motion.     Cervical back: Normal range of motion.  Skin:    General: Skin is warm.  Neurological:     Mental Status: She is alert and oriented to person, place, and time.     Imaging: DG Lumbar Spine Complete  Result Date: 07/05/2019 CLINICAL DATA:  Back pain. Patient reports pain for months. EXAM: LUMBAR SPINE - COMPLETE 4+ VIEW COMPARISON:  Radiograph 3 days ago 07/02/2019. Reformats from abdominal CT 12/21/2019 FINDINGS: Levo scoliotic curvature of the lumbar spine of 40 degrees. Mild left lateral translation of L4 on L5, degenerative. Diffuse degenerative disc disease and facet hypertrophy. Vertebral body heights are preserved. No acute or compression fracture. The bones are under mineralized. Calcified aortic  atherosclerosis. Aortic aneurysm demonstrated on prior CTA. IMPRESSION: Scoliosis and multilevel degenerative change in the lumbar spine. No evidence of acute abnormality. Electronically Signed   By: 12/23/2019 M.D.   On: 07/05/2019 16:39   MR Lumbar Spine W Wo Contrast  Result Date: 07/06/2019 CLINICAL DATA:  Low back pain for several weeks EXAM: MRI LUMBAR SPINE WITHOUT AND WITH CONTRAST TECHNIQUE: Multiplanar and multiecho pulse sequences of the lumbar spine were obtained without and with intravenous contrast. CONTRAST:  11mL GADAVIST GADOBUTROL 1 MMOL/ML IV SOLN COMPARISON:  None similar FINDINGS: Segmentation:  5 lumbar type vertebrae Alignment: Marked levoscoliosis with Cobb angle measured on preceding radiography. Vertebrae: There is a horizontal fracture plane through the L1 superior endplate with marrow edema and enhancement consistent with recent injury. No evidence of underlying mass. Height loss measures up to 25%. Negative for retropulsion or posterior element involvement. Best seen on T1 weighted imaging there is bilateral vertical sacral insufficiency. Conus medullaris and cauda equina: Conus extends to the L1 level. Conus and cauda equina appear normal. Paraspinal and other soft tissues: Very tortuous abdominal aorta with aneurysm and left-sided mural thrombus. The aorta was evaluated by CTA last month. T2 hyperintensity in the central right liver correlating with hemangioma on CT. Large hiatal hernia. Disc levels: T12- L1: Disc narrowing and asymmetric left-sided height loss with bulging. No neural impingement L1-L2: Disc narrowing and bulging with endplate spurring. No impingement L2-L3: Disc narrowing with asymmetric right-sided collapse and spurring. Noncompressive right foraminal narrowing. Patent spinal canal L3-L4: Disc narrowing and endplate degeneration with right far-lateral spurring. Mild facet spurring. L4-L5: Disc narrowing and bulging. Degenerative facet spurring. Moderate  spinal stenosis with bilateral subarticular recess narrowing that could affect either L5 nerve root. No foraminal compression L5-S1:Disc narrowing with asymmetric left-sided height loss and bulging. Degenerative facet spurring asymmetric to the left. There is left foraminal impingement. Patent spinal canal. IMPRESSION: 1. L1 acute or subacute compression fracture with 25% height loss. 2. Acute or subacute bilateral sacral ala insufficiency fracture. 3. Diffuse degenerative disease with scoliosis. 4. L4-5 moderate spinal stenosis. Either L5 nerve root could be affected in the subarticular recesses. 5. L5-S1 left foraminal impingement. Electronically Signed  By: Marnee Spring M.D.   On: 07/06/2019 11:35    Labs:  CBC: Recent Labs    11/01/18 1555 06/03/19 1218 07/05/19 1604 07/06/19 0556  WBC 7.5 6.3 9.7 7.5  HGB 12.1 11.6* 10.1* 9.0*  HCT 35.8* 35.4* 30.2* 28.2*  PLT 284.0 268 331 290    COAGS: No results for input(s): INR, APTT in the last 8760 hours.  BMP: Recent Labs    05/07/19 1453 06/03/19 1218 07/05/19 1604 07/06/19 0556  NA 129* 132* 134* 138  K 3.8 4.3 3.3* 3.4*  CL 96 97* 98 105  CO2 24 24 23 23   GLUCOSE 91 102* 109* 89  BUN 21 18 52* 37*  CALCIUM 9.6 10.1 9.4 8.4*  CREATININE 1.00 0.99 1.17* 0.95  GFRNONAA  --  54* 44* 57*  GFRAA  --  >60 51* >60    LIVER FUNCTION TESTS: Recent Labs    11/01/18 1555 05/07/19 1453 07/06/19 0556  BILITOT 0.4 0.4 0.5  AST 19 20 33  ALT 10 8 14   ALKPHOS 48 52 69  PROT 8.4* 7.7 6.1*  ALBUMIN 4.3 3.9 2.6*    Assessment and Plan:  79 y.o, female inpatient. History of AAA (followed by vascular) presented to this facility with persistent lower back pain X 3 weeks unresolved with narcotic pain medication. Found to have acute or subacture compression fracture with height loss on L1 and cute or subacute bilateral sacral ala insufficiency fracture Team is requesting evaluation for  kyphoplasty (L1) possible  sacroplasty.  Patient denies any falls that correlate with back pain (patient does endorse a fall from bed approximately 1 week ago) Patient reports generalized diffuse lower back pain, paresthesia to the  lateral aspect of bilateral lower extremities and increase in difficulty with urination. Patient denies any decrease in sensation or pin point tenderness. Patient also denies any loss of bowel or bladder  Pertinent Imaging 3.14.21 - MR Lumbar reads L1 acute or subacute compression fracture with 25% height loss. Acute or subacute bilateral sacral ala insufficiency fracture.  Pertinent IR History none  Pertinent Allergies NKDA  BUN 37  All labs are within acceptable parameters.  Patient is on pletal last dose given on 3.15.21 @ 09:26 and .Patient is on subcutaneous prophylactic dose of lovenox.  Patient is afebrile.  Patient tentatively scheduled for procedure 3.19.21. IR to  Team instructed to: Keep Patient to be NPO after midnight Hold Pletal for 96 hours. Request sent to Team to hold effective 3.16.21 if approved by prescribing provider Hold prophylactic anticoagulation at midnight  IR will call patient when ready.   Risks and benefits of Kyphoplasty possible sacroplasty were discussed with the patient including, but not limited to education regarding the natural healing process of compression fractures without intervention, bleeding, infection, cement migration which may cause spinal cord damage, paralysis, pulmonary embolism or even death.  This interventional procedure involves the use of X-rays and because of the nature of the planned procedure, it is possible that we will have prolonged use of X-ray fluoroscopy.  Potential radiation risks to you include (but are not limited to) the following: - A slightly elevated risk for cancer  several years later in life. This risk is typically less than 0.5% percent. This risk is low in comparison to the normal incidence of human cancer,  which is 33% for women and 50% for men according to the [employer]. - Radiation induced injury can include skin redness, resembling a rash, tissue breakdown / ulcers and  hair loss (which can be temporary or permanent).   The likelihood of either of these occurring depends on the difficulty of the procedure and whether you are sensitive to radiation due to previous procedures, disease, or genetic conditions.   IF your procedure requires a prolonged use of radiation, you will be notified and given written instructions for further action.  It is your responsibility to monitor the irradiated area for the 2 weeks following the procedure and to notify your physician if you are concerned that you have suffered a radiation induced injury.    All of the patient's questions were answered, patient is agreeable to proceed.  Consent signed and in chart.  Thank you for this interesting consult.  I greatly enjoyed meeting Jennifer [name] and look forward to participating in their care.  A copy of this report was sent to the requesting provider on this date.  Electronically Signed: Marletta Lormohundro, Lawrnce Reyez Corey, NP 07/07/2019, 5:01 PM   I spent a total of 40 Minutes    in face to face in clinical consultation, greater than 50% of which was counseling/coordinating care for hypoplasty - sacroplasty

## 2019-07-07 NOTE — Progress Notes (Signed)
PROGRESS NOTE    Jennifer [name]  OIN:[id number] DOB: 1940-11-07 DOA: 07/05/2019 PCP: Binnie Rail, MD  Brief Narrative:79 y.o.femalewith medical history significant ofhypertension, depression, GERD, hyperlipidemia, peripheral vascular diseaserecently diagnosed AAA followed by vascular surgery Dr. Scot Dock who was given her second Covid vaccine early in February and since then she has been sick she has been having significant low back pain nausea vomiting or diarrhea. Patient came today however with poor appetite severe low back pain unable to lay back. Pain is 10 out of 10 worse with any movement. She was sent to orthopedic surgery where she had CT done with subsequent referral back to vascular surgeon 3 days ago. She was referred back to orthopedic surgery by vascular surgery to continue evaluation but pain is unbearable so patient came to the ER. She is moving in bed rolling in severe pain. Initial pain management in the ER was inadequate. She has been admitted to the hospital for pain control. Patient denied any dizziness. Denied any trauma. She does have abdominal pain that sometimes radiates to the back. She is being admitted mainly for pain management and further evaluation..  ED Course:Temperature 98.2, blood pressure 130/59 pulse 121 respirate of 19 oxygen sat 89% on room air. Sodium 134 potassium 3.3: 98 CO2 23 glucose 109. BUN is 52 and creatinine 1.17. White count 9.7 hemoglobin 10.9 platelet 331. COVID-19 screen currently pending. Fecal occult blood test is negative x3. X-ray of the lumbar region showed scoliosis and multilevel degenerative change no evidence of acute abnormalities. Patient being admitted mainly for pain management  07/07/2019 patient with severe pain unable to walk even to the restroom she needed 2 people to assist her.  IV morphine not helping.  Assessment & Plan:   Principal Problem:   Back pain Active Problems:   Dyslipidemia   Tobacco  abuse   Essential hypertension   PAD (peripheral artery disease) (HCC)   Anxiety state   Abdominal aortic aneurysm (AAA) (HCC)   #1 Severe DJD with scoliosis-patient admitted with severe low back pain 10 out of 10 unable to get out of bed or ambulate.  This morning she is still in excruciating pain she reports her pain is 8 out of 10.  She lives alone.  She has not gotten out of bed since she came to the hospital.   Since IV morphine is not helping I will try low-dose Dilaudid.  MRI of the spine lumbar-L1 acute or subacute compression fracture with 25% height loss.  Acute or subacute bilateral sacral ala insufficiency fracture.  Diffuse degenerative disease with scoliosis. L4-5 moderate spinal stenosis. Either L5 nerve root could be affected in the subarticular recesses.  L5-S1 left foraminal impingement.  PT evaluation is pending.  #2 history of essential hypertension is 118/75 continue to hold Norvasc.-  #3 history of infrarenal abdominal aortic aneurysm 5.4 cm followed by vascular surgery patient saw Dr. Doren Custard on 07/02/2019 for the same.  Follow-up ultrasound in 6 months.  #4 history of anxiety disorder continue Xanax  #5 hyperlipidemia continue statin  #6 peripheral vascular disease continue Pletal  #7 history of tobacco abuse continue nicotine patch patient counseled   #8 chronic hypokalemia on potassium replacement at home continue.  Estimated body mass index is 15.96 kg/m as calculated from the following:   Height as of this encounter: 5\' 4"  (1.626 m).   Weight as of this encounter: 42.2 kg.  DVT prophylaxis: Lovenox Code Status: Full code Family Communication: Attempted to call her Sister Benjamine Mola twice  without any response.   Disposition Plan: Patient came from home Unknown discharge plans at this time PT evaluation is pending Barriers to discharge patient with excruciating lower back pain with acute L1 fracture ends L4-L5 spinal stenosis IR consult is  pending Consultants:   IR  Procedures: None Antimicrobials: None  Subjective: Patient resting in bed unable to move due to excruciating lower back pain IV morphine not helping  Objective: Vitals:   07/06/19 0526 07/06/19 1325 07/06/19 2012 07/07/19 0535  BP: 117/77 128/76 102/66 118/75  Pulse: 82 88 86 81  Resp: 13 18 16 18   Temp: 98.2 F (36.8 C) (!) 97.5 F (36.4 C) 98.8 F (37.1 C) 98.3 F (36.8 C)  TempSrc: Oral  Oral Oral  SpO2: 94% 91% 97% 94%  Weight:      Height:        Intake/Output Summary (Last 24 hours) at 07/07/2019 1139 Last data filed at 07/07/2019 0947 Gross per 24 hour  Intake 1744.93 ml  Output 700 ml  Net 1044.93 ml   Filed Weights   07/05/19 1436  Weight: 42.2 kg    Examination:  General exam: Appears calm and comfortable  Respiratory system: Clear to auscultation. Respiratory effort normal. Cardiovascular system: S1 & S2 heard, RRR. No JVD, murmurs, rubs, gallops or clicks. No pedal edema. Gastrointestinal system: Abdomen is nondistended, soft and nontender. No organomegaly or masses felt. Normal bowel sounds heard. Central nervous system: Alert and oriented. No focal neurological deficits. Extremities: Symmetric 5 x 5 power.,  Lower lumbar spinal tenderness Skin: No rashes, lesions or ulcers Psychiatry: Judgement and insight appear normal. Mood & affect appropriate.     Data Reviewed: I have personally reviewed following labs and imaging studies  CBC: Recent Labs  Lab 07/05/19 1604 07/06/19 0556  WBC 9.7 7.5  NEUTROABS 7.7  --   HGB 10.1* 9.0*  HCT 30.2* 28.2*  MCV 82.5 84.9  PLT 331 290   Basic Metabolic Panel: Recent Labs  Lab 07/05/19 1604 07/06/19 0556  NA 134* 138  K 3.3* 3.4*  CL 98 105  CO2 23 23  GLUCOSE 109* 89  BUN 52* 37*  CREATININE 1.17* 0.95  CALCIUM 9.4 8.4*   GFR: Estimated Creatinine Clearance: 31.5 mL/min (by C-G formula based on SCr of 0.95 mg/dL). Liver Function Tests: Recent Labs  Lab  07/06/19 0556  AST 33  ALT 14  ALKPHOS 69  BILITOT 0.5  PROT 6.1*  ALBUMIN 2.6*   No results for input(s): LIPASE, AMYLASE in the last 168 hours. No results for input(s): AMMONIA in the last 168 hours. Coagulation Profile: No results for input(s): INR, PROTIME in the last 168 hours. Cardiac Enzymes: No results for input(s): CKTOTAL, CKMB, CKMBINDEX, TROPONINI in the last 168 hours. BNP (last 3 results) No results for input(s): PROBNP in the last 8760 hours. HbA1C: No results for input(s): HGBA1C in the last 72 hours. CBG: No results for input(s): GLUCAP in the last 168 hours. Lipid Profile: No results for input(s): CHOL, HDL, LDLCALC, TRIG, CHOLHDL, LDLDIRECT in the last 72 hours. Thyroid Function Tests: No results for input(s): TSH, T4TOTAL, FREET4, T3FREE, THYROIDAB in the last 72 hours. Anemia Panel: No results for input(s): VITAMINB12, FOLATE, FERRITIN, TIBC, IRON, RETICCTPCT in the last 72 hours. Sepsis Labs: No results for input(s): PROCALCITON, LATICACIDVEN in the last 168 hours.  Recent Results (from the past 240 hour(s))  SARS CORONAVIRUS 2 (TAT 6-24 HRS) Nasopharyngeal Nasopharyngeal Swab     Status: None   Collection  Time: 07/05/19  6:06 PM   Specimen: Nasopharyngeal Swab  Result Value Ref Range Status   SARS Coronavirus 2 NEGATIVE NEGATIVE Final    Comment: (NOTE) SARS-CoV-2 target nucleic acids are NOT DETECTED. The SARS-CoV-2 RNA is generally detectable in upper and lower respiratory specimens during the acute phase of infection. Negative results do not preclude SARS-CoV-2 infection, do not rule out co-infections with other pathogens, and should not be used as the sole basis for treatment or other patient management decisions. Negative results must be combined with clinical observations, patient history, and epidemiological information. The expected result is Negative. Fact Sheet for Patients: HairSlick.no Fact Sheet for  Healthcare Providers: quierodirigir.com This test is not yet approved or cleared by the [place] FDA and  has been authorized for detection and/or diagnosis of SARS-CoV-2 by FDA under an Emergency Use Authorization (EUA). This EUA will remain  in effect (meaning this test can be used) for the duration of the COVID-19 declaration under Section 56 4(b)(1) of the Act, 21 U.S.C. section 360bbb-3(b)(1), unless the authorization is terminated or revoked sooner. Performed at [facility], [address] [place], [place] [address]          Radiology Studies: DG Lumbar Spine Complete  Result Date: 07/05/2019 CLINICAL DATA:  Back pain. Patient reports pain for months. EXAM: LUMBAR SPINE - COMPLETE 4+ VIEW COMPARISON:  Radiograph 3 days ago 07/02/2019. Reformats from abdominal CT 12/21/2019 FINDINGS: Levo scoliotic curvature of the lumbar spine of 40 degrees. Mild left lateral translation of L4 on L5, degenerative. Diffuse degenerative disc disease and facet hypertrophy. Vertebral body heights are preserved. No acute or compression fracture. The bones are under mineralized. Calcified aortic atherosclerosis. Aortic aneurysm demonstrated on prior CTA. IMPRESSION: Scoliosis and multilevel degenerative change in the lumbar spine. No evidence of acute abnormality. Electronically Signed   By: Narda Rutherford M.D.   On: 07/05/2019 16:39   MR Lumbar Spine W Wo Contrast  Result Date: 07/06/2019 CLINICAL DATA:  Low back pain for several weeks EXAM: MRI LUMBAR SPINE WITHOUT AND WITH CONTRAST TECHNIQUE: Multiplanar and multiecho pulse sequences of the lumbar spine were obtained without and with intravenous contrast. CONTRAST:  75mL GADAVIST GADOBUTROL 1 MMOL/ML IV SOLN COMPARISON:  None similar FINDINGS: Segmentation:  5 lumbar type vertebrae Alignment: Marked levoscoliosis with Cobb angle measured on preceding radiography. Vertebrae: There is a horizontal fracture plane through  the L1 superior endplate with marrow edema and enhancement consistent with recent injury. No evidence of underlying mass. Height loss measures up to 25%. Negative for retropulsion or posterior element involvement. Best seen on T1 weighted imaging there is bilateral vertical sacral insufficiency. Conus medullaris and cauda equina: Conus extends to the L1 level. Conus and cauda equina appear normal. Paraspinal and other soft tissues: Very tortuous abdominal aorta with aneurysm and left-sided mural thrombus. The aorta was evaluated by CTA last month. T2 hyperintensity in the central right liver correlating with hemangioma on CT. Large hiatal hernia. Disc levels: T12- L1: Disc narrowing and asymmetric left-sided height loss with bulging. No neural impingement L1-L2: Disc narrowing and bulging with endplate spurring. No impingement L2-L3: Disc narrowing with asymmetric right-sided collapse and spurring. Noncompressive right foraminal narrowing. Patent spinal canal L3-L4: Disc narrowing and endplate degeneration with right far-lateral spurring. Mild facet spurring. L4-L5: Disc narrowing and bulging. Degenerative facet spurring. Moderate spinal stenosis with bilateral subarticular recess narrowing that could affect either L5 nerve root. No foraminal compression L5-S1:Disc narrowing with asymmetric left-sided height loss and bulging.  Degenerative facet spurring asymmetric to the left. There is left foraminal impingement. Patent spinal canal. IMPRESSION: 1. L1 acute or subacute compression fracture with 25% height loss. 2. Acute or subacute bilateral sacral ala insufficiency fracture. 3. Diffuse degenerative disease with scoliosis. 4. L4-5 moderate spinal stenosis. Either L5 nerve root could be affected in the subarticular recesses. 5. L5-S1 left foraminal impingement. Electronically Signed   By: Marnee Spring M.D.   On: 07/06/2019 11:35        Scheduled Meds: . aspirin EC  81 mg Oral QAC breakfast  . atorvastatin   20 mg Oral q1800  . bisacodyl  10 mg Oral Daily  . calcium-vitamin D  2 tablet Oral Daily  . cilostazol  50 mg Oral BID  . docusate sodium  200 mg Oral Daily  . enoxaparin (LOVENOX) injection  30 mg Subcutaneous Q24H  . gabapentin  100 mg Oral QHS  . lidocaine  1 patch Transdermal Q24H  . pantoprazole  40 mg Oral Daily  . potassium chloride SA  20 mEq Oral Daily   Continuous Infusions: . sodium chloride 1,000 mL (07/07/19 0110)     LOS: 2 days     Alwyn Ren, MD 07/07/2019, 11:39 AM

## 2019-07-08 MED ORDER — ENOXAPARIN SODIUM 30 MG/0.3ML ~~LOC~~ SOLN: 30.0000 mg | Freq: Once | SUBCUTANEOUS | Status: DC

## 2019-07-08 MED ORDER — ENOXAPARIN SODIUM 30 MG/0.3ML ~~LOC~~ SOLN
30.0000 mg | SUBCUTANEOUS | Status: AC
  Administered 2019-07-08 – 2019-07-09 (×2): 30 mg via SUBCUTANEOUS
  Filled 2019-07-08 (×2): qty 0.3

## 2019-07-08 MED ORDER — AMLODIPINE BESYLATE 5 MG PO TABS
5.0000 mg | ORAL_TABLET | Freq: Every day | ORAL | Status: DC
  Administered 2019-07-08 – 2019-07-16 (×9): 5 mg via ORAL
  Filled 2019-07-08 (×9): qty 1

## 2019-07-08 NOTE — TOC Initial Note (Signed)
Transition of Care [facility]) - Initial/Assessment Note    Patient Details  Name: Jennifer [name] MRN: [id number] Date of Birth: Feb 23, 1941  [facility] CM/SW Contact:    Lennart Pall, LCSW Phone Number: 07/08/2019, 2:01 PM  Clinical Narrative:    Met with pt to review plans for surgery end of week and potential dc plans dependent on mobility and ADL functioning post surgery.  Pt does live alone and notes she has a sister, brother and sister-in-law living "very close by and willing to help", however, she does not want to rely on them to provide 24/7 support.  She is aware that she will likely require rehab following surgery.  She reports that she will need to be modified independent to return home and is agreeable to either CIR or SNF.  Will continue to monitor progress               Expected Discharge Plan: IP Rehab Facility(vs. SNF) Barriers to Discharge: Continued Medical Work up   Patient Goals and CMS Choice Patient states their goals for this hospitalization and ongoing recovery are:: Pt's goal is to eventually be able to return to her home where she lives alone.      Expected Discharge Plan and Services Expected Discharge Plan: IP Rehab Facility(vs. SNF) In-house Referral: Clinical Social Work     Living arrangements for the past 2 months: Single Family Home                                      Prior Living Arrangements/Services Living arrangements for the past 2 months: Single Family Home Lives with:: Self   Do you feel safe going back to the place where you live?: Yes(she feels very certain that she will not feel ready to return home directly from this hospital but will need rehab prior to return home)      Need for Family Participation in Patient Care: Yes (Comment) Care giver support system in place?: No (comment)   Criminal Activity/Legal Involvement Pertinent to Current Situation/Hospitalization: No - Comment as needed  Activities of Daily  Living Home Assistive Devices/Equipment: Walker (specify type) ADL Screening (condition at time of admission) Patient's cognitive ability adequate to safely complete daily activities?: Yes Is the patient deaf or have difficulty hearing?: No Does the patient have difficulty seeing, even when wearing glasses/contacts?: No Does the patient have difficulty concentrating, remembering, or making decisions?: No Patient able to express need for assistance with ADLs?: Yes Does the patient have difficulty dressing or bathing?: No Independently performs ADLs?: Yes (appropriate for developmental age) Does the patient have difficulty walking or climbing stairs?: No Weakness of Legs: None Weakness of Arms/Hands: None  Permission Sought/Granted Permission sought to share information with : Family Supports Permission granted to share information with : Yes, Verbal Permission Granted  Share Information with NAME: Benjamine Mola     Permission granted to share info w Relationship: sister  Permission granted to share info w Contact Information: [phone]  Emotional Assessment Appearance:: Appears stated age Attitude/Demeanor/Rapport: Engaged, Gracious Affect (typically observed): Accepting, Calm Orientation: : Oriented to Situation, Oriented to  Time, Oriented to Place, Oriented to Self   Psych Involvement: No (comment)  Admission diagnosis:  Back pain [M54.9] Chronic low back pain without sciatica, unspecified back pain laterality [M54.5, G89.29] Patient Active Problem List   Diagnosis Date Noted  . Back pain 07/05/2019  . Lumbar radiculopathy  06/27/2019  . Lung nodule 06/10/2019  . Abdominal aortic aneurysm (AAA) ([place]) 06/10/2019  . Chest pain, musculoskeletal 06/10/2019  . Ascending aortic aneurysm ([place]) 06/10/2019  . Hiatal hernia, large 01/09/2018  . GERD (gastroesophageal reflux disease) 12/08/2016  . History of Clostridium difficile infection 11/29/2016  . Prediabetes 11/02/2016  .  Carotid arterial disease ([place]) 10/20/2015  . Anxiety state   . PAD (peripheral artery disease) ([place]) 10/10/2011  . Osteoporosis, senile 05/17/2011  . Dyslipidemia 05/03/2009  . Tobacco abuse 04/14/2009  . Essential hypertension 04/14/2009   PCP:  Binnie Rail, MD Pharmacy:   [facility] [place], [address] [place] [address] Phone: [phone] Fax: [phone]     Social Determinants of Health (SDOH) Interventions    Readmission Risk Interventions No flowsheet data found.

## 2019-07-08 NOTE — Progress Notes (Signed)
Rehab Admissions Coordinator Note:  Patient was screened by Clois Dupes for appropriateness for an Inpatient Acute Rehab Consult per therapy recs. Noted for kyphoplasty on Friday. We will follow to await progress with therapy to assist with rehab venue options. [facility] will unlikely approve an inpt rehab admit with this diagnosis.  Clois Dupes RN MSN 07/08/2019, 9:51 AM  I can be reached at [phone].

## 2019-07-08 NOTE — Care Management Important Message (Signed)
Important Message  Patient Details IM Letter given Daryel Gerald SW Case Manager to present to the Patient Name: Jennifer [name] MRN: [id number] Date of Birth: 25-Apr-1940   Medicare Important Message Given:  Yes     Caren Macadam 07/08/2019, 10:23 AM

## 2019-07-08 NOTE — Progress Notes (Signed)
PROGRESS NOTE    Jennifer [name]  ZOX:[id number] DOB: 1941-02-11 DOA: 07/05/2019 PCP: Pincus Sanes, MD    Brief Narrative: 79 y.o.femalewith medical history significant ofhypertension, depression, GERD, hyperlipidemia, peripheral vascular diseaserecently diagnosed AAA followed by vascular surgery Dr. Edilia Bo who was given her second Covid vaccine early in February and since then she has been sick she has been having significant low back pain nausea vomiting or diarrhea. Patient came today however with poor appetite severe low back pain unable to lay back. Pain is 10 out of 10 worse with any movement. She was sent to orthopedic surgery where she had CT done with subsequent referral back to vascular surgeon 3 days ago. She was referred back to orthopedic surgery by vascular surgery to continue evaluation but pain is unbearable so patient came to the ER. She is moving in bed rolling in severe pain. Initial pain management in the ER was inadequate. She has been admitted to the hospital for pain control. Patient denied any dizziness. Denied any trauma. She does have abdominal pain that sometimes radiates to the back. She is being admitted mainly for pain management and further evaluation..  ED Course:Temperature 98.2, blood pressure 130/59 pulse 121 respirate of 19 oxygen sat 89% on room air. Sodium 134 potassium 3.3: 98 CO2 23 glucose 109. BUN is 52 and creatinine 1.17. White count 9.7 hemoglobin 10.9 platelet 331. COVID-19 screen currently pending. Fecal occult blood test is negative x3. X-ray of the lumbar region showed scoliosis and multilevel degenerative change no evidence of acute abnormalities. Patient being admitted mainly for pain management  3/16- Assessment & Plan:   Principal Problem:   Back pain Active Problems:   Dyslipidemia   Tobacco abuse   Essential hypertension   PAD (peripheral artery disease) (HCC)   Anxiety state   Abdominal aortic aneurysm (AAA)  (HCC)  #1 Severe DJDwith scoliosis-with excruciating pain still 10 out of 10 on Dilaudid and oxycodone and Flexeril.  Appreciate IR input.  Patient to have kyphoplasty/sacral plasty waiting for insurance approval.  Waiting for Pletal to be out of her system.   She lives alone. She was seen by PT and recommended CIR.   Hold Lovenox on Thursday for possible kyphoplasty on Friday.  MRI of the spine lumbar-L1 acute or subacute compression fracture with 25% height loss.  Acute or subacute bilateral sacral ala insufficiency fracture.  Diffuse degenerative disease with scoliosis. L4-5 moderate spinal stenosis. Either L5 nerve root could be affected in the subarticular recesses.  L5-S1 left foraminal impingement.  PT evaluation recommends CIR  #2 history of essential hypertension blood pressure 146/81.  Restart Norvasc.   #3 history of infrarenal abdominal aortic aneurysm 5.4 cm followed by vascular surgery patient saw Dr. Durwin Nora on 07/02/2019 for the same. Follow-up ultrasound in 6 months.  #4 history of anxiety disorder continue Xanax  #5 hyperlipidemia continue statin  #6 peripheral vascular disease Pletal on hold for surgery  #7 history of tobacco abuse continue nicotine patch patient counseled   #8 chronic hypokalemia on potassium replacement at home continue.  Recheck labs in a.m.   Estimated body mass index is 15.96 kg/m as calculated from the following:   Height as of this encounter: 5\' 4"  (1.626 m).   Weight as of this encounter: 42.2 kg.  DVT prophylaxis: Lovenox Code Status: Full code Family Communication:  Discussed with her Sister on 07/08/2019  Disposition Plan: Patient came from home She likely will need SNF on discharge for rehabilitation Barriers to discharge  patient with excruciating lower back pain with acute L1 fracture ends L4-L5 spinal stenosis IR recommending kyphoplasty and sacral plasty waiting for insurance authorization and to get Pletal  out of the system  consultants:   IR  Procedures: None Antimicrobials: None Subjective:  Patient resting in bed slightly better than yesterday but with severe acute excruciating pain  with movement Objective: Vitals:   07/07/19 1608 07/07/19 2042 07/07/19 2114 07/08/19 0619  BP:  (!) 141/83  (!) 146/81  Pulse:  69  87  Resp:  16  15  Temp:  97.8 F (36.6 C)  98.2 F (36.8 C)  TempSrc:  Oral  Oral  SpO2: 94% (!) 89% 91% 92%  Weight:      Height:        Intake/Output Summary (Last 24 hours) at 07/08/2019 1206 Last data filed at 07/08/2019 3244 Gross per 24 hour  Intake --  Output 2200 ml  Net -2200 ml   Filed Weights   07/05/19 1436  Weight: 42.2 kg    Examination:  General exam: Appears in mild distress due to pain Respiratory system: Clear to auscultation. Respiratory effort normal. Cardiovascular system: S1 & S2 heard, RRR. No JVD, murmurs, rubs, gallops or clicks. No pedal edema. Gastrointestinal system: Abdomen is nondistended, soft and nontender. No organomegaly or masses felt. Normal bowel sounds heard. Central nervous system: Alert and oriented. No focal neurological deficits. Extremities: Symmetric 5 x 5 power. Skin: No rashes, lesions or ulcers Psychiatry: Judgement and insight appear normal. Mood & affect appropriate.     Data Reviewed: I have personally reviewed following labs and imaging studies  CBC: Recent Labs  Lab 07/05/19 1604 07/06/19 0556  WBC 9.7 7.5  NEUTROABS 7.7  --   HGB 10.1* 9.0*  HCT 30.2* 28.2*  MCV 82.5 84.9  PLT 331 010   Basic Metabolic Panel: Recent Labs  Lab 07/05/19 1604 07/06/19 0556  NA 134* 138  K 3.3* 3.4*  CL 98 105  CO2 23 23  GLUCOSE 109* 89  BUN 52* 37*  CREATININE 1.17* 0.95  CALCIUM 9.4 8.4*   GFR: Estimated Creatinine Clearance: 31.5 mL/min (by C-G formula based on SCr of 0.95 mg/dL). Liver Function Tests: Recent Labs  Lab 07/06/19 0556  AST 33  ALT 14  ALKPHOS 69  BILITOT 0.5  PROT 6.1*   ALBUMIN 2.6*   No results for input(s): LIPASE, AMYLASE in the last 168 hours. No results for input(s): AMMONIA in the last 168 hours. Coagulation Profile: No results for input(s): INR, PROTIME in the last 168 hours. Cardiac Enzymes: No results for input(s): CKTOTAL, CKMB, CKMBINDEX, TROPONINI in the last 168 hours. BNP (last 3 results) No results for input(s): PROBNP in the last 8760 hours. HbA1C: No results for input(s): HGBA1C in the last 72 hours. CBG: No results for input(s): GLUCAP in the last 168 hours. Lipid Profile: No results for input(s): CHOL, HDL, LDLCALC, TRIG, CHOLHDL, LDLDIRECT in the last 72 hours. Thyroid Function Tests: No results for input(s): TSH, T4TOTAL, FREET4, T3FREE, THYROIDAB in the last 72 hours. Anemia Panel: No results for input(s): VITAMINB12, FOLATE, FERRITIN, TIBC, IRON, RETICCTPCT in the last 72 hours. Sepsis Labs: No results for input(s): PROCALCITON, LATICACIDVEN in the last 168 hours.  Recent Results (from the past 240 hour(s))  SARS CORONAVIRUS 2 (TAT 6-24 HRS) Nasopharyngeal Nasopharyngeal Swab     Status: None   Collection Time: 07/05/19  6:06 PM   Specimen: Nasopharyngeal Swab  Result Value Ref Range Status  SARS Coronavirus 2 NEGATIVE NEGATIVE Final    Comment: (NOTE) SARS-CoV-2 target nucleic acids are NOT DETECTED. The SARS-CoV-2 RNA is generally detectable in upper and lower respiratory specimens during the acute phase of infection. Negative results do not preclude SARS-CoV-2 infection, do not rule out co-infections with other pathogens, and should not be used as the sole basis for treatment or other patient management decisions. Negative results must be combined with clinical observations, patient history, and epidemiological information. The expected result is Negative. Fact Sheet for Patients: HairSlick.no Fact Sheet for Healthcare Providers: quierodirigir.com This test  is not yet approved or cleared by the [place] FDA and  has been authorized for detection and/or diagnosis of SARS-CoV-2 by FDA under an Emergency Use Authorization (EUA). This EUA will remain  in effect (meaning this test can be used) for the duration of the COVID-19 declaration under Section 56 4(b)(1) of the Act, 21 U.S.C. section 360bbb-3(b)(1), unless the authorization is terminated or revoked sooner. Performed at [facility], [address] [place], [place] [address]          Radiology Studies: No results found.      Scheduled Meds: . aspirin EC  81 mg Oral QAC breakfast  . atorvastatin  20 mg Oral q1800  . bisacodyl  10 mg Oral Daily  . calcium-vitamin D  2 tablet Oral Daily  . docusate sodium  200 mg Oral Daily  . enoxaparin (LOVENOX) injection  30 mg Subcutaneous Q24H  . gabapentin  100 mg Oral QHS  . lidocaine  1 patch Transdermal Q24H  . pantoprazole  40 mg Oral Daily  . potassium chloride SA  20 mEq Oral Daily   Continuous Infusions: . sodium chloride 1,000 mL (07/08/19 1117)     LOS: 3 days     Alwyn Ren, MD  07/08/2019, 12:06 PM

## 2019-07-08 NOTE — Progress Notes (Signed)
Physical Therapy Treatment Patient Details Name: Jennifer [name] MRN: [id number] DOB: Mar 18, 1941 Today's Date: 07/08/2019    History of Present Illness 79 year old female admitted in increasing back pain, no known injury. PMH includes HTN, GERD,hyperlipidemia, PVD, According to chart, a potential for L1 kyphoplasty and bilateral sacroplasty is planned for this Friday 07/11/2019    PT Comments    Patient resting in bed when PT arrived. She is awaiting final decisions regarding surgery pending insurance approval. She agreed to ex format as previously instructed, and printed HEP had been issued. She states she was fairly comfortable . Able to perform ex as outlined on flow sheet - and included IS with good return demo , proper techniques. Will hold PT until after cleared by surgery if the procedure does get approved-  Discussed with patient and she will continue to perform the LE ex and IS in the meantime.   Follow Up Recommendations  CIR     Equipment Recommendations  Rolling walker with 5" wheels    Recommendations for Other Services       Precautions / Restrictions Precautions Precautions: Fall Precaution Comments: She is at risk for falls, and still having break through pain - radicular in nature down both legs Restrictions Weight Bearing Restrictions: No Other Position/Activity Restrictions: She currently has difficulty attempting to stand secondary to severe pain. L1 kyphoplasty and Bilateral sacroplasty is tentatively planned for this Friday 07/11/2019    Mobility  Bed Mobility Overal bed mobility: Needs Assistance(did not attempt any OOB activity this session)                Transfers                    Ambulation/Gait                 Stairs             Wheelchair Mobility    Modified Rankin (Stroke Patients Only)       Balance Overall balance assessment: (Did not attempt any OOB activity during this session.)                                          Cognition Arousal/Alertness: Awake/alert   Overall Cognitive Status: Within Functional Limits for tasks assessed                                 General Comments: Very pleasant and cooperative- receptive to PT- agrred to perform ex as instructed for HEP- printed sheet had been issued. Will monitor for any clearance post surgery if procedures are approved through insurance.      Exercises General Exercises - Lower Extremity Ankle Circles/Pumps: AROM;Supine;10 reps Quad Sets: AROM;Supine;10 reps Gluteal Sets: AROM;Supine;10 reps Short Arc Quad: AROM;Supine;10 reps Heel Slides: AROM;Supine;10 reps Hip ABduction/ADduction: AROM;AAROM;Both;10 reps;Supine Straight Leg Raises: AROM;AAROM;10 reps;Both;Supine Other Exercises Other Exercises: Reminded to perform ex as instructed and printed sheet was provided. Other Exercises: Practiced IS and able to achieve 750 with good breath control, good return demo    General Comments General comments (skin integrity, edema, etc.): Monitor skin and joint integrity- she has increased pain  on any attempts to stand, and has difficulty with log rolling techniques      Pertinent Vitals/Pain Pain Score: 3  Pain Location: Low back-  most pronounced in sitting , s<>stand with RW Pain Descriptors / Indicators: Aching;Shooting;Constant Pain Intervention(s): (Has been receiving medication per RN- and currently states "she is fairly comfortable")    Home Living                      Prior Function            PT Goals (current goals can now be found in the care plan section) Acute Rehab PT Goals Patient Stated Goal: To get well, and be painfree- get back to PLOF PT Goal Formulation: With patient Time For Goal Achievement: 07/21/19 Progress towards PT goals: Progressing toward goals(Anticipates surgical intervention this Friday 07/11/2019)    Frequency    Min 3X/week      PT Plan Current  plan remains appropriate    Co-evaluation              AM-PAC PT "6 Clicks" Mobility   Outcome Measure  Help needed turning from your back to your side while in a flat bed without using bedrails?: A Little Help needed moving from lying on your back to sitting on the side of a flat bed without using bedrails?: A Lot Help needed moving to and from a bed to a chair (including a wheelchair)?: A Lot Help needed standing up from a chair using your arms (e.g., wheelchair or bedside chair)?: A Lot Help needed to walk in hospital room?: A Lot Help needed climbing 3-5 steps with a railing? : A Lot 6 Click Score: 13    End of Session   Activity Tolerance: Patient limited by pain Patient left: in bed Nurse Communication: Mobility status PT Visit Diagnosis: Muscle weakness (generalized) (M62.81);Other abnormalities of gait and mobility (R26.89);Unsteadiness on feet (R26.81)     Time: 3893-7342 PT Time Calculation (min) (ACUTE ONLY): 30 min  Charges:  $Therapeutic Exercise: 23-37 mins                     Rollen Sox, PT # [phone] CGV cell  Casandra Doffing 07/08/2019, 3:49 PM

## 2019-07-08 NOTE — Evaluation (Signed)
Physical Therapy Evaluation Patient Details Name: Jennifer [name] MRN: [id number] DOB: 1940/05/16 Today's Date: 07/08/2019   History of Present Illness   79 year old female admitted with increasing back pain, no known injury. She was found to have possibly 3 fracture sites, and potential plan per chart is for L1 kyphoplasty and bilateral sacroplasty this Friday, 07/11/2019. PMH includes HTN, GERD, hyperlipidemia, PVD, AAA.    Clinical Impression  Very pleasant female patient receptive to PT. She states she does have back pain, but it increases in sitting and sit<>stand. Has difficulty with log rolling technique. She states she is normally independent in PLOF, including driving- and only recently borrowed a RW to ambulate due to the pain in her back. She lives alone in a townhouse setting, 3 outside steps with handrails, tub-shower combo, stationary shower head, no grab bars. Commode is standard height. She is generally weak at this time. Instructed in LE/UE ex format with good return demo, and printed sheet was issued for reference. She was unable to stand fully erect with RW and assist of 2 due to increased pain on standing. She does have radicular symptoms bilaterally to knee level. Should benefit from ongoing PT- initially with ex format- and then once cleared post op for increased OOB activity.    Follow Up Recommendations CIR    Equipment Recommendations  Rolling walker with 5" wheels    Recommendations for Other Services       Precautions / Restrictions Precautions Precautions: Fall Precaution Comments: She is at risk for falls, and still having break through pain - radicular in nature down both legs Restrictions Weight Bearing Restrictions: No Other Position/Activity Restrictions: She currently has difficulty attempting to stand secondary to severe pain. L1 kyphoplasty and Bilateral sacroplasty is tentatively planned for this Friday 07/11/2019      Mobility  Bed Mobility Overal  bed mobility: Needs Assistance Bed Mobility: Rolling;Sidelying to Sit;Supine to Sit;Sit to Supine;Sit to Sidelying Rolling: Min guard;Min assist Sidelying to sit: Min guard;Min assist Supine to sit: Min guard;Min assist Sit to supine: Min guard;Min assist Sit to sidelying: Min guard;Min assist General bed mobility comments: Has difficulty with log rolling technique  Transfers Overall transfer level: Needs assistance Equipment used: Rolling walker (2 wheeled) Transfers: Sit to/from Stand Sit to Stand: +2 physical assistance;Min guard;Min assist         General transfer comment: Could not stand erect  Ambulation/Gait Ambulation/Gait assistance: Max assist;Mod assist;+2 physical assistance Gait Distance (Feet): 4 Feet(Could not ambulate further secondary to pain) Assistive device: Rolling walker (2 wheeled) Gait Pattern/deviations: Shuffle;Step-to pattern   Gait velocity interpretation: 1.31 - 2.62 ft/sec, indicative of limited community Insurance account manager Rankin (Stroke Patients Only)       Balance Overall balance assessment: Needs assistance   Sitting balance-Leahy Scale: Good   Postural control: (FF trunk)   Standing balance-Leahy Scale: Fair                               Pertinent Vitals/Pain Pain Assessment: 0-10 Pain Score: 6  Pain Location: Low back- most pronounced in sitting , s<>stand with RW Pain Descriptors / Indicators: Aching;Shooting;Constant(radicular pain noted to knee level, bilaterally)    Home Living Family/patient expects to be discharged to:: Private residence Living Arrangements: Alone Available Help at Discharge: Family(But per patient , no one can  stay with her (can run errands etc)) Type of Home: Other(Comment)(Townhouse) Home Access: Stairs to enter Entrance Stairs-Rails: Right;Left;Can reach both Entrance Stairs-Number of Steps: 3 Home Layout: Two level Home Equipment:  Walker - 2 wheels(borrowed, and only used recently with the increase in back pain) Additional Comments: Commode standard height. She had been able to drive until recently    Prior Function Level of Independence: Independent               Hand Dominance   Dominant Hand: Right    Extremity/Trunk Assessment        Lower Extremity Assessment Lower Extremity Assessment: Generalized weakness    Cervical / Trunk Assessment Cervical / Trunk Assessment: Normal  Communication   Communication: No difficulties  Cognition Arousal/Alertness: Awake/alert Behavior During Therapy: WFL for tasks assessed/performed Overall Cognitive Status: Within Functional Limits for tasks assessed                                 General Comments: Very pleasant and cooperative- receptive to PT      General Comments General comments (skin integrity, edema, etc.): Monitor skin and joint integrity- she has increased pain on attempts to stand, and has difficulty with log rolling techniques    Exercises General Exercises - Lower Extremity Ankle Circles/Pumps: AROM;Supine;10 reps Quad Sets: AROM;Supine;10 reps Gluteal Sets: AROM;Supine;10 reps Short Arc Quad: AROM;Supine;10 reps Heel Slides: AROM;Supine;10 reps Other Exercises Other Exercises: Reminded to perform ex as instructed and printed sheet was provided.   Assessment/Plan    PT Assessment Patient needs continued PT services  PT Problem List Decreased strength;Decreased activity tolerance;Pain;Decreased mobility       PT Treatment Interventions Gait training;DME instruction;Stair training;Therapeutic activities;Patient/family education;Therapeutic exercise    PT Goals (Current goals can be found in the Care Plan section)       Frequency Min 3X/week   Barriers to discharge   She lives alone, and has no one to stay with her even temporarily. According to most recent notes- L1 kyphoplasty and bilateral sacroplasty is  planned for this Friday.    Co-evaluation               AM-PAC PT "6 Clicks" Mobility  Outcome Measure Help needed turning from your back to your side while in a flat bed without using bedrails?: A Little Help needed moving from lying on your back to sitting on the side of a flat bed without using bedrails?: A Lot Help needed moving to and from a bed to a chair (including a wheelchair)?: A Lot Help needed standing up from a chair using your arms (e.g., wheelchair or bedside chair)?: A Lot Help needed to walk in hospital room?: A Lot Help needed climbing 3-5 steps with a railing? : Total 6 Click Score: 12    End of Session   Activity Tolerance: Patient limited by pain Patient left: in bed Nurse Communication: Mobility status PT Visit Diagnosis: Muscle weakness (generalized) (M62.81);Other abnormalities of gait and mobility (R26.89);Unsteadiness on feet (R26.81)    Time:  - 1510- 1600 (50 minutes)     Charges: One MOD eval, One unit therapeutic activity, and one unit therapeutic exercise             Rollen Sox, PT # [phone] CGV cell  Casandra Doffing 07/08/2019, 8:30 AM

## 2019-07-09 ENCOUNTER — Other Ambulatory Visit (HOSPITAL_COMMUNITY): Payer: Medicare Other

## 2019-07-09 DIAGNOSIS — Z720 Tobacco use: Secondary | ICD-10-CM

## 2019-07-09 LAB — COMPREHENSIVE METABOLIC PANEL
ALT: 14 U/L (ref 0–44)
AST: 27 U/L (ref 15–41)
Albumin: 2.6 g/dL — ABNORMAL LOW (ref 3.5–5.0)
Alkaline Phosphatase: 81 U/L (ref 38–126)
Anion gap: 9 (ref 5–15)
BUN: 7 mg/dL — ABNORMAL LOW (ref 8–23)
CO2: 26 mmol/L (ref 22–32)
Calcium: 7.7 mg/dL — ABNORMAL LOW (ref 8.9–10.3)
Chloride: 99 mmol/L (ref 98–111)
Creatinine, Ser: 0.63 mg/dL (ref 0.44–1.00)
GFR calc Af Amer: >60 mL/min (ref >60)
GFR calc non Af Amer: >60 mL/min (ref >60)
Glucose, Bld: 88 mg/dL (ref 70–99)
Potassium: 2.6 mmol/L — CL (ref 3.5–5.1)
Sodium: 134 mmol/L — ABNORMAL LOW (ref 135–145)
Total Bilirubin: 0.6 mg/dL (ref 0.3–1.2)
Total Protein: 6.2 g/dL — ABNORMAL LOW (ref 6.5–8.1)

## 2019-07-09 LAB — CBC
HCT: 30.2 % — ABNORMAL LOW (ref 36.0–46.0)
Hemoglobin: 9.6 g/dL — ABNORMAL LOW (ref 12.0–15.0)
MCH: 26.8 pg (ref 26.0–34.0)
MCHC: 31.8 g/dL (ref 30.0–36.0)
MCV: 84.4 fL (ref 80.0–100.0)
Platelets: 291 10*3/uL (ref 150–400)
RBC: 3.58 MIL/uL — ABNORMAL LOW (ref 3.87–5.11)
RDW: 14.7 % (ref 11.5–15.5)
WBC: 7.1 10*3/uL (ref 4.0–10.5)
nRBC: 0 % (ref 0.0–0.2)

## 2019-07-09 MED ORDER — POTASSIUM CHLORIDE CRYS ER 20 MEQ PO TBCR
20.0000 meq | EXTENDED_RELEASE_TABLET | Freq: Once | ORAL | Status: AC
  Administered 2019-07-09: 20 meq via ORAL
  Filled 2019-07-09: qty 1

## 2019-07-09 MED ORDER — SODIUM CHLORIDE 0.9 % IV SOLN
1000.0000 mL | INTRAVENOUS | Status: DC
  Administered 2019-07-09: 1000 mL via INTRAVENOUS

## 2019-07-09 MED ORDER — POTASSIUM CHLORIDE 10 MEQ/100ML IV SOLN
10.0000 meq | INTRAVENOUS | Status: DC
  Administered 2019-07-09 (×2): 10 meq via INTRAVENOUS
  Filled 2019-07-09 (×2): qty 100

## 2019-07-09 MED ORDER — POTASSIUM CHLORIDE CRYS ER 20 MEQ PO TBCR
40.0000 meq | EXTENDED_RELEASE_TABLET | Freq: Two times a day (BID) | ORAL | Status: DC
  Administered 2019-07-09 – 2019-07-10 (×3): 40 meq via ORAL
  Filled 2019-07-09 (×3): qty 2

## 2019-07-09 NOTE — Progress Notes (Signed)
PROGRESS NOTE    Jennifer [name]  ELF:[id number] DOB: [birth year] DOA: 07/05/2019 PCP: Binnie Rail, MD    Brief Narrative:  79 y.o.femalewith medical history significant ofhypertension, depression, GERD, hyperlipidemia, peripheral vascular diseaserecently diagnosed AAA followed by vascular surgery Dr. Scot Dock presents to the ED, c/o poor appetite, severe low back pain unable to lay back. Pain is 10 out of 10 worse with any movement. She was sent to orthopedic surgery where she had CT done with subsequent referral back to vascular surgeon 3 days ago. She was referred back to orthopedic surgery by vascular surgery to continue evaluation but pain is unbearable so patient came to the ER. Pt admitted to the hospital for pain control. Denied any trauma. In the ED, X-ray of the lumbar region showed scoliosis and multilevel degenerative change no evidence of acute abnormalities. Patient being admitted mainly for pain management.    Today, patient still complaining of significant back pain, worse with any movement.  Patient denies any chest pain, shortness of breath, abdominal pain, nausea/vomiting, fever/chills.   Assessment & Plan:   Principal Problem:   Back pain Active Problems:   Dyslipidemia   Tobacco abuse   Essential hypertension   PAD (peripheral artery disease) (HCC)   Anxiety state   Abdominal aortic aneurysm (AAA) (HCC)  Severe back pain likely 2/2 acute/subacute compression fracture  MRI of lumbar spine showed L1 acute or subacute compression fracture with 25% height loss, diffuse degenerative disease with scoliosis, L4-5 moderate spinal stenosis, L5-S1 left foraminal impingement IR consulted, plan for kyphoplasty/sacral plasty pending insurance approval PT recommended CIR Pain management  Chronic hypokalemia Replace as needed Daily BMP  Essential hypertension  Continue Norvasc  History of infrarenal abdominal aortic aneurysm  5.4 cm followed by vascular  surgery, Dr. Scot Dock Follow-up ultrasound in 6 months  Anxiety disorder  Continue Xanax  Hyperlipidemia Continue statin  Peripheral vascular disease Continue to hold cilostazol pending procedure  Tobacco abuse Pt advised to quit    Estimated body mass index is 15.96 kg/m as calculated from the following:   Height as of this encounter: 5\' 4"  (1.626 m).   Weight as of this encounter: 42.2 kg.  DVT prophylaxis: Lovenox Code Status: Full code Family Communication:  Discussed with patient   Disposition Plan:  Patient came from home, likely discharge to CIR Barriers to discharge patient with excruciating lower back pain with acute L1 fracture ends L4-L5 spinal stenosis. IR recommending kyphoplasty and sacral plasty waiting for insurance authorization    Consultants:   IR  Procedures: None Antimicrobials: None    Objective: Vitals:   07/08/19 1344 07/08/19 1540 07/08/19 2053 07/09/19 0505  BP: (!) 157/91  (!) 146/88 140/84  Pulse: 93  77 88  Resp: 20  16 18   Temp: 97.9 F (36.6 C)  98 F (36.7 C) 97.8 F (36.6 C)  TempSrc:   Oral Oral  SpO2: 95% 95% 97% 94%  Weight:      Height:        Intake/Output Summary (Last 24 hours) at 07/09/2019 1534 Last data filed at 07/09/2019 5852 Gross per 24 hour  Intake 7239.77 ml  Output 1900 ml  Net 5339.77 ml   Filed Weights   07/05/19 1436  Weight: 42.2 kg    Examination:  General: NAD   Cardiovascular: S1, S2 present  Respiratory: CTAB  Abdomen: Soft, nontender, nondistended, bowel sounds present  Musculoskeletal: No bilateral pedal edema noted  Skin: Normal  Psychiatry: Normal mood    Data  Reviewed: I have personally reviewed following labs and imaging studies  CBC: Recent Labs  Lab 07/05/19 1604 07/06/19 0556 07/09/19 0536  WBC 9.7 7.5 7.1  NEUTROABS 7.7  --   --   HGB 10.1* 9.0* 9.6*  HCT 30.2* 28.2* 30.2*  MCV 82.5 84.9 84.4  PLT 331 290 291   Basic Metabolic Panel: Recent Labs    Lab 07/05/19 1604 07/06/19 0556 07/09/19 0536  NA 134* 138 134*  K 3.3* 3.4* 2.6*  CL 98 105 99  CO2 23 23 26   GLUCOSE 109* 89 88  BUN 52* 37* 7*  CREATININE 1.17* 0.95 0.63  CALCIUM 9.4 8.4* 7.7*   GFR: Estimated Creatinine Clearance: 37.4 mL/min (by C-G formula based on SCr of 0.63 mg/dL). Liver Function Tests: Recent Labs  Lab 07/06/19 0556 07/09/19 0536  AST 33 27  ALT 14 14  ALKPHOS 69 81  BILITOT 0.5 0.6  PROT 6.1* 6.2*  ALBUMIN 2.6* 2.6*   No results for input(s): LIPASE, AMYLASE in the last 168 hours. No results for input(s): AMMONIA in the last 168 hours. Coagulation Profile: No results for input(s): INR, PROTIME in the last 168 hours. Cardiac Enzymes: No results for input(s): CKTOTAL, CKMB, CKMBINDEX, TROPONINI in the last 168 hours. BNP (last 3 results) No results for input(s): PROBNP in the last 8760 hours. HbA1C: No results for input(s): HGBA1C in the last 72 hours. CBG: No results for input(s): GLUCAP in the last 168 hours. Lipid Profile: No results for input(s): CHOL, HDL, LDLCALC, TRIG, CHOLHDL, LDLDIRECT in the last 72 hours. Thyroid Function Tests: No results for input(s): TSH, T4TOTAL, FREET4, T3FREE, THYROIDAB in the last 72 hours. Anemia Panel: No results for input(s): VITAMINB12, FOLATE, FERRITIN, TIBC, IRON, RETICCTPCT in the last 72 hours. Sepsis Labs: No results for input(s): PROCALCITON, LATICACIDVEN in the last 168 hours.  Recent Results (from the past 240 hour(s))  SARS CORONAVIRUS 2 (TAT 6-24 HRS) Nasopharyngeal Nasopharyngeal Swab     Status: None   Collection Time: 07/05/19  6:06 PM   Specimen: Nasopharyngeal Swab  Result Value Ref Range Status   SARS Coronavirus 2 NEGATIVE NEGATIVE Final    Comment: (NOTE) SARS-CoV-2 target nucleic acids are NOT DETECTED. The SARS-CoV-2 RNA is generally detectable in upper and lower respiratory specimens during the acute phase of infection. Negative results do not preclude SARS-CoV-2  infection, do not rule out co-infections with other pathogens, and should not be used as the sole basis for treatment or other patient management decisions. Negative results must be combined with clinical observations, patient history, and epidemiological information. The expected result is Negative. Fact Sheet for Patients: 07/07/19 Fact Sheet for Healthcare Providers: HairSlick.no This test is not yet approved or cleared by the [employer] FDA and  has been authorized for detection and/or diagnosis of SARS-CoV-2 by FDA under an Emergency Use Authorization (EUA). This EUA will remain  in effect (meaning this test can be used) for the duration of the COVID-19 declaration under Section 56 4(b)(1) of the Act, 21 U.S.C. section 360bbb-3(b)(1), unless the authorization is terminated or revoked sooner. Performed at [facility], [address] [place], [place]          Radiology Studies: No results found.      Scheduled Meds: . amLODipine  5 mg Oral Daily  . aspirin EC  81 mg Oral QAC breakfast  . atorvastatin  20 mg Oral q1800  . bisacodyl  10 mg Oral Daily  . calcium-vitamin D  2 tablet Oral Daily  . docusate sodium  200 mg Oral Daily  . enoxaparin (LOVENOX) injection  30 mg Subcutaneous Q24H  . gabapentin  100 mg Oral QHS  . lidocaine  1 patch Transdermal Q24H  . pantoprazole  40 mg Oral Daily  . potassium chloride SA  40 mEq Oral BID   Continuous Infusions: . sodium chloride 1,000 mL (07/09/19 0953)     LOS: 4 days     Briant Cedar, MD  07/09/2019, 3:34 PM

## 2019-07-09 NOTE — Plan of Care (Signed)

## 2019-07-09 NOTE — Progress Notes (Addendum)
CRITICAL VALUE ALERT  Critical Value:  Potassium 2.6  Date & Time Notied: 07/09/2019 ; 5830 am  Provider Notified: Katherina Right, NP  Orders Received/Actions taken:see new orders from the [facility].

## 2019-07-10 LAB — BASIC METABOLIC PANEL
Anion gap: 8 (ref 5–15)
BUN: 9 mg/dL (ref 8–23)
CO2: 24 mmol/L (ref 22–32)
Calcium: 7.9 mg/dL — ABNORMAL LOW (ref 8.9–10.3)
Chloride: 100 mmol/L (ref 98–111)
Creatinine, Ser: 0.62 mg/dL (ref 0.44–1.00)
GFR calc Af Amer: >60 mL/min (ref >60)
GFR calc non Af Amer: >60 mL/min (ref >60)
Glucose, Bld: 92 mg/dL (ref 70–99)
Potassium: 3.8 mmol/L (ref 3.5–5.1)
Sodium: 132 mmol/L — ABNORMAL LOW (ref 135–145)

## 2019-07-10 LAB — CBC WITH DIFFERENTIAL/PLATELET
Abs Immature Granulocytes: 0.04 10*3/uL (ref 0.00–0.07)
Basophils Absolute: 0 10*3/uL (ref 0.0–0.1)
Basophils Relative: 1 %
Eosinophils Absolute: 0.1 10*3/uL (ref 0.0–0.5)
Eosinophils Relative: 2 %
HCT: 30.5 % — ABNORMAL LOW (ref 36.0–46.0)
Hemoglobin: 10 g/dL — ABNORMAL LOW (ref 12.0–15.0)
Immature Granulocytes: 1 %
Lymphocytes Relative: 20 %
Lymphs Abs: 1.4 10*3/uL (ref 0.7–4.0)
MCH: 27.9 pg (ref 26.0–34.0)
MCHC: 32.8 g/dL (ref 30.0–36.0)
MCV: 85 fL (ref 80.0–100.0)
Monocytes Absolute: 0.6 10*3/uL (ref 0.1–1.0)
Monocytes Relative: 9 %
Neutro Abs: 4.8 10*3/uL (ref 1.7–7.7)
Neutrophils Relative %: 67 %
Platelets: 293 10*3/uL (ref 150–400)
RBC: 3.59 MIL/uL — ABNORMAL LOW (ref 3.87–5.11)
RDW: 14.6 % (ref 11.5–15.5)
WBC: 7 10*3/uL (ref 4.0–10.5)
nRBC: 0 % (ref 0.0–0.2)

## 2019-07-10 LAB — PROTIME-INR
INR: 1.1 (ref 0.8–1.2)
Prothrombin Time: 13.7 seconds (ref 11.4–15.2)

## 2019-07-10 MED ORDER — ENSURE ENLIVE PO LIQD
237.0000 mL | Freq: Two times a day (BID) | ORAL | Status: DC
  Administered 2019-07-10 – 2019-07-15 (×7): 237 mL via ORAL

## 2019-07-10 MED ORDER — CEFAZOLIN SODIUM-DEXTROSE 2-4 GM/100ML-% IV SOLN: 2.0000 g | INTRAVENOUS | Status: AC

## 2019-07-10 NOTE — Progress Notes (Signed)
IR Brief Note:  IR requested by Dr. Ashley Royalty for possible image-guided L1 kyphoplasty/vertebroplasty.  Case has been reviewed by Dr. Archer Asa who approves procedure. Patient has been seen/consented for procedure, consent in chart. Plan for image-guided L1 KP/VP tentatively for tomorrow in IR. Patient will be NPO at midnight. Hold Lovenox until 24 hours post-procedure. INR 1.1 today. Ancef ordered, to be given in IR.  Please call IR with questions/concerns.   Waylan Boga Shelby Anderle, PA-C 07/10/2019, 10:00 AM

## 2019-07-10 NOTE — Progress Notes (Signed)
PROGRESS NOTE    SHAVELL NORED  AST:[id number] DOB: 09/09/1940 DOA: 07/05/2019 PCP: Pincus Sanes, MD    Brief Narrative:  79 y.o.femalewith medical history significant ofhypertension, depression, GERD, hyperlipidemia, peripheral vascular diseaserecently diagnosed AAA followed by vascular surgery Dr. Edilia Bo presents to the ED, c/o poor appetite, severe low back pain unable to lay back. Pain is 10 out of 10 worse with any movement. She was sent to orthopedic surgery where she had CT done with subsequent referral back to vascular surgeon 3 days ago. She was referred back to orthopedic surgery by vascular surgery to continue evaluation but pain is unbearable so patient came to the ER. Pt admitted to the hospital for pain control. Denied any trauma. In the ED, X-ray of the lumbar region showed scoliosis and multilevel degenerative change no evidence of acute abnormalities. Patient being admitted mainly for pain management.    Today, patient still complains of back pain, worse with any movement.  Patient denies any weakness in her lower extremities, chest pain, shortness of breath, abdominal pain, nausea/vomiting, fever/chills.   Assessment & Plan:   Principal Problem:   Back pain Active Problems:   Dyslipidemia   Tobacco abuse   Essential hypertension   PAD (peripheral artery disease) (HCC)   Anxiety state   Abdominal aortic aneurysm (AAA) (HCC)  Severe back pain likely 2/2 acute/subacute compression fracture  MRI of lumbar spine showed L1 acute or subacute compression fracture with 25% height loss, diffuse degenerative disease with scoliosis, L4-5 moderate spinal stenosis, L5-S1 left foraminal impingement IR consulted, plan for kyphoplasty/sacral plasty on 07/11/19 PT recommended CIR Pain management  Chronic hypokalemia Replace as needed Daily BMP  Essential hypertension  Continue Norvasc  History of infrarenal abdominal aortic aneurysm  5.4 cm followed by vascular  surgery, Dr. Edilia Bo Follow-up ultrasound in 6 months  Anxiety disorder  Continue Xanax  Hyperlipidemia Continue statin  Peripheral vascular disease Continue to hold cilostazol pending procedure  Tobacco abuse Pt advised to quit    Estimated body mass index is 15.96 kg/m as calculated from the following:   Height as of this encounter: 5\' 4"  (1.626 m).   Weight as of this encounter: 42.2 kg.  DVT prophylaxis: Lovenox Code Status: Full code Family Communication:  Discussed with patient, brother at  bedside   Disposition Plan:  Patient came from home, likely discharge to CIR Barriers to discharge patient with excruciating lower back pain with acute L1 fracture ends L4-L5 spinal stenosis. IR recommending kyphoplasty and sacral plasty on 07/11/19   Consultants:   IR  Procedures: None Antimicrobials: None    Objective: Vitals:   07/09/19 1611 07/09/19 2032 07/10/19 0459 07/10/19 1323  BP: 135/76 108/70 (!) 141/84 122/81  Pulse: 70 76 72 (!) 54  Resp: 20 20 20 20   Temp: 97.7 F (36.5 C) 98 F (36.7 C) 98 F (36.7 C) 98.6 F (37 C)  TempSrc: Oral Oral Oral Oral  SpO2: 98% 95% 92% 95%  Weight:      Height:        Intake/Output Summary (Last 24 hours) at 07/10/2019 1353 Last data filed at 07/10/2019 0459 Gross per 24 hour  Intake --  Output 1600 ml  Net -1600 ml   Filed Weights   07/05/19 1436  Weight: 42.2 kg    Examination:  General: NAD   Cardiovascular: S1, S2 present  Respiratory: CTAB  Abdomen: Soft, nontender, nondistended, bowel sounds present  Musculoskeletal: No bilateral pedal edema noted  Skin: Normal  Psychiatry:  Normal mood    Data Reviewed: I have personally reviewed following labs and imaging studies  CBC: Recent Labs  Lab 07/05/19 1604 07/06/19 0556 07/09/19 0536 07/10/19 0556  WBC 9.7 7.5 7.1 7.0  NEUTROABS 7.7  --   --  4.8  HGB 10.1* 9.0* 9.6* 10.0*  HCT 30.2* 28.2* 30.2* 30.5*  MCV 82.5 84.9 84.4 85.0   PLT 331 290 291 425   Basic Metabolic Panel: Recent Labs  Lab 07/05/19 1604 07/06/19 0556 07/09/19 0536 07/10/19 0556  NA 134* 138 134* 132*  K 3.3* 3.4* 2.6* 3.8  CL 98 105 99 100  CO2 23 23 26 24   GLUCOSE 109* 89 88 92  BUN 52* 37* 7* 9  CREATININE 1.17* 0.95 0.63 0.62  CALCIUM 9.4 8.4* 7.7* 7.9*   GFR: Estimated Creatinine Clearance: 37.4 mL/min (by C-G formula based on SCr of 0.62 mg/dL). Liver Function Tests: Recent Labs  Lab 07/06/19 0556 07/09/19 0536  AST 33 27  ALT 14 14  ALKPHOS 69 81  BILITOT 0.5 0.6  PROT 6.1* 6.2*  ALBUMIN 2.6* 2.6*   No results for input(s): LIPASE, AMYLASE in the last 168 hours. No results for input(s): AMMONIA in the last 168 hours. Coagulation Profile: Recent Labs  Lab 07/10/19 0556  INR 1.1   Cardiac Enzymes: No results for input(s): CKTOTAL, CKMB, CKMBINDEX, TROPONINI in the last 168 hours. BNP (last 3 results) No results for input(s): PROBNP in the last 8760 hours. HbA1C: No results for input(s): HGBA1C in the last 72 hours. CBG: No results for input(s): GLUCAP in the last 168 hours. Lipid Profile: No results for input(s): CHOL, HDL, LDLCALC, TRIG, CHOLHDL, LDLDIRECT in the last 72 hours. Thyroid Function Tests: No results for input(s): TSH, T4TOTAL, FREET4, T3FREE, THYROIDAB in the last 72 hours. Anemia Panel: No results for input(s): VITAMINB12, FOLATE, FERRITIN, TIBC, IRON, RETICCTPCT in the last 72 hours. Sepsis Labs: No results for input(s): PROCALCITON, LATICACIDVEN in the last 168 hours.  Recent Results (from the past 240 hour(s))  SARS CORONAVIRUS 2 (TAT 6-24 HRS) Nasopharyngeal Nasopharyngeal Swab     Status: None   Collection Time: 07/05/19  6:06 PM   Specimen: Nasopharyngeal Swab  Result Value Ref Range Status   SARS Coronavirus 2 NEGATIVE NEGATIVE Final    Comment: (NOTE) SARS-CoV-2 target nucleic acids are NOT DETECTED. The SARS-CoV-2 RNA is generally detectable in upper and lower respiratory  specimens during the acute phase of infection. Negative results do not preclude SARS-CoV-2 infection, do not rule out co-infections with other pathogens, and should not be used as the sole basis for treatment or other patient management decisions. Negative results must be combined with clinical observations, patient history, and epidemiological information. The expected result is Negative. Fact Sheet for Patients: SugarRoll.be Fact Sheet for Healthcare Providers: https://www.woods-mathews.com/ This test is not yet approved or cleared by the [place] FDA and  has been authorized for detection and/or diagnosis of SARS-CoV-2 by FDA under an Emergency Use Authorization (EUA). This EUA will remain  in effect (meaning this test can be used) for the duration of the COVID-19 declaration under Section 56 4(b)(1) of the Act, 21 U.S.C. section 360bbb-3(b)(1), unless the authorization is terminated or revoked sooner. Performed at [facility], [place] [address] [place],  [address]          Radiology Studies: No results found.      Scheduled Meds: . amLODipine  5 mg Oral Daily  . aspirin EC  81 mg  Oral QAC breakfast  . atorvastatin  20 mg Oral q1800  . bisacodyl  10 mg Oral Daily  . calcium-vitamin D  2 tablet Oral Daily  . docusate sodium  200 mg Oral Daily  . gabapentin  100 mg Oral QHS  . lidocaine  1 patch Transdermal Q24H  . pantoprazole  40 mg Oral Daily  . potassium chloride SA  40 mEq Oral BID   Continuous Infusions: . [START ON 07/11/2019]  ceFAZolin (ANCEF) IV       LOS: 5 days     Briant Cedar, MD  07/10/2019, 1:53 PM

## 2019-07-11 ENCOUNTER — Inpatient Hospital Stay (HOSPITAL_COMMUNITY): Payer: Medicare Other

## 2019-07-11 HISTORY — PX: IR KYPHO LUMBAR INC FX REDUCE BONE BX UNI/BIL CANNULATION INC/IMAGING: IMG5519

## 2019-07-11 LAB — CBC WITH DIFFERENTIAL/PLATELET
Abs Immature Granulocytes: 0.04 10*3/uL (ref 0.00–0.07)
Basophils Absolute: 0.1 10*3/uL (ref 0.0–0.1)
Basophils Relative: 1 %
Eosinophils Absolute: 0.1 10*3/uL (ref 0.0–0.5)
Eosinophils Relative: 1 %
HCT: 32.5 % — ABNORMAL LOW (ref 36.0–46.0)
Hemoglobin: 10.4 g/dL — ABNORMAL LOW (ref 12.0–15.0)
Immature Granulocytes: 1 %
Lymphocytes Relative: 23 %
Lymphs Abs: 1.7 10*3/uL (ref 0.7–4.0)
MCH: 26.7 pg (ref 26.0–34.0)
MCHC: 32 g/dL (ref 30.0–36.0)
MCV: 83.5 fL (ref 80.0–100.0)
Monocytes Absolute: 0.7 10*3/uL (ref 0.1–1.0)
Monocytes Relative: 9 %
Neutro Abs: 5 10*3/uL (ref 1.7–7.7)
Neutrophils Relative %: 65 %
Platelets: 349 10*3/uL (ref 150–400)
RBC: 3.89 MIL/uL (ref 3.87–5.11)
RDW: 14.8 % (ref 11.5–15.5)
WBC: 7.6 10*3/uL (ref 4.0–10.5)
nRBC: 0 % (ref 0.0–0.2)

## 2019-07-11 LAB — BASIC METABOLIC PANEL
Anion gap: 9 (ref 5–15)
BUN: 15 mg/dL (ref 8–23)
CO2: 26 mmol/L (ref 22–32)
Calcium: 8.5 mg/dL — ABNORMAL LOW (ref 8.9–10.3)
Chloride: 98 mmol/L (ref 98–111)
Creatinine, Ser: 0.67 mg/dL (ref 0.44–1.00)
GFR calc Af Amer: >60 mL/min (ref >60)
GFR calc non Af Amer: >60 mL/min (ref >60)
Glucose, Bld: 99 mg/dL (ref 70–99)
Potassium: 4.2 mmol/L (ref 3.5–5.1)
Sodium: 133 mmol/L — ABNORMAL LOW (ref 135–145)

## 2019-07-11 IMAGING — XA IR KYPHO VERTEBRAL LUMBAR AUGMENTATION
5 of 7 series · 13 of 24 positions shown · non-contrast
Comparison: none

CLINICAL DATA: Low back pain. MR demonstrates subacute unhealed L1
compression fracture deformity, and bilateral sacral ala
insufficiency fractures.

EXAM:
KYPHOPLASTY AT LUMBAR L1
TECHNIQUE: The procedure, risks (including but not limited to bleeding,
infection, organ damage), benefits, and alternatives were explained
to the patient. Questions regarding the procedure were encouraged
and answered. The patient understands and consents to the procedure.

[Series 2: fl - angio · 2 of 40 frames shown]
[frame 7/40]
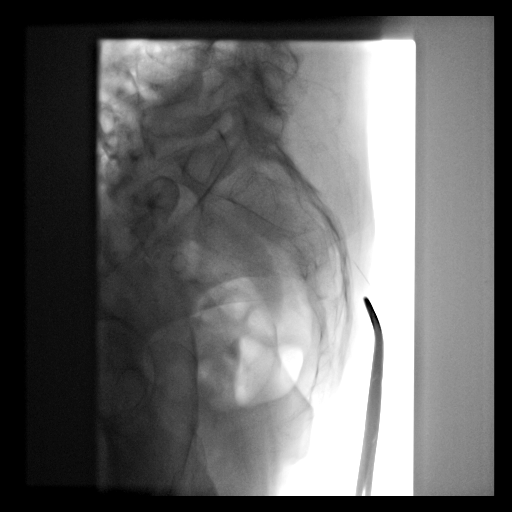
[frame 32/40]
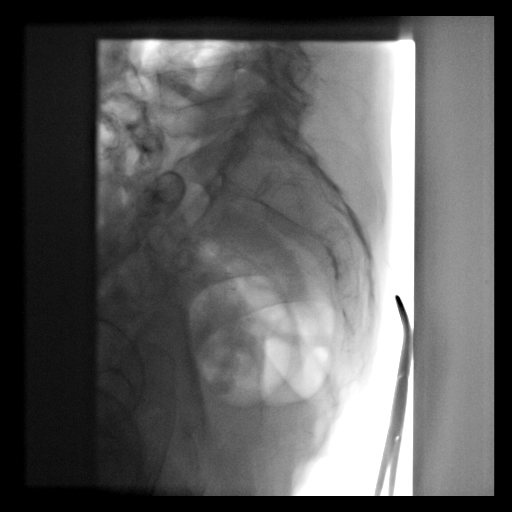

[Series 11: care single · 1 of 1 slices shown (1 of 3)]
[im 1/1]
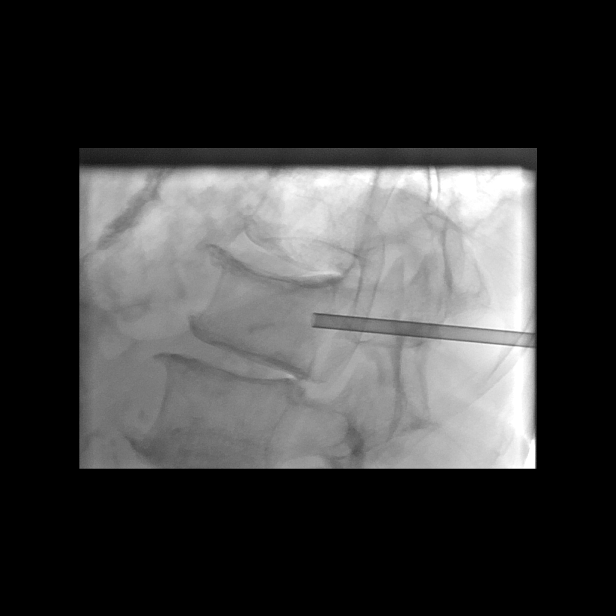

[Series 18: care single · 1 of 1 slices shown (2 of 3)]
[im 1/1]
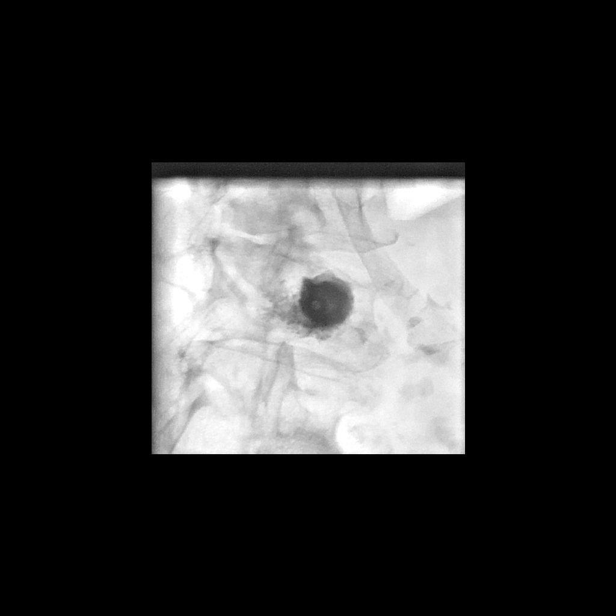

[Series 20: care single · 1 of 1 slices shown (3 of 3)]
[im 1/1]
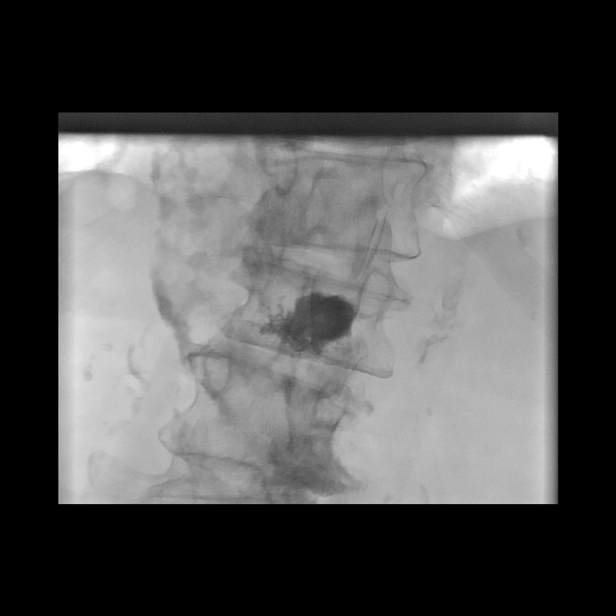

[Series 300: ir kypho lumbar inc fx reduce bone bx un · 8 of 16 slices shown]
[im 2/16]
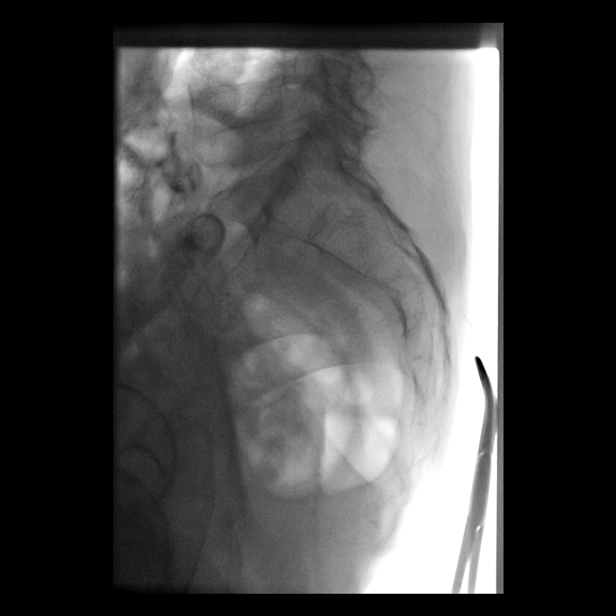
[im 5/16]
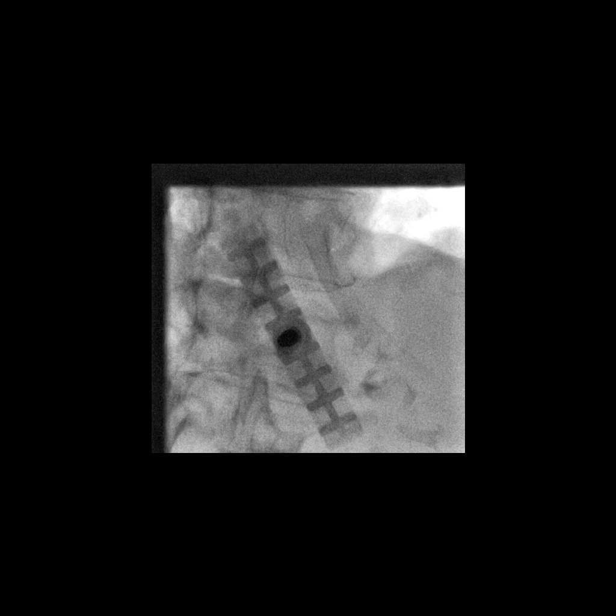
[im 6/16]
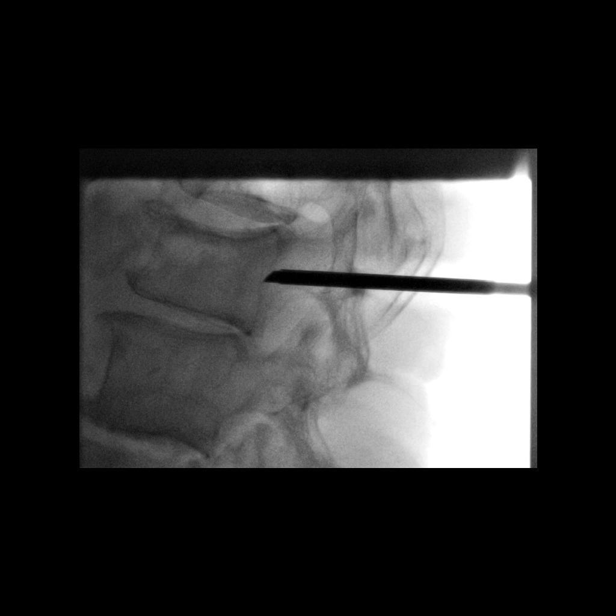
[im 8/16]
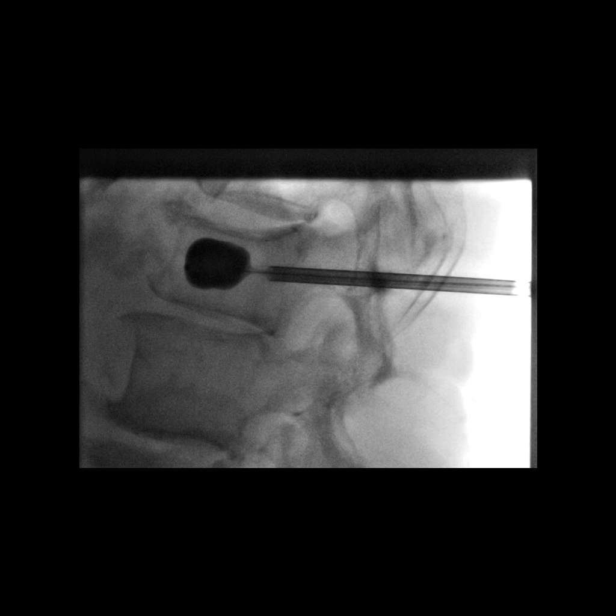
[im 10/16]
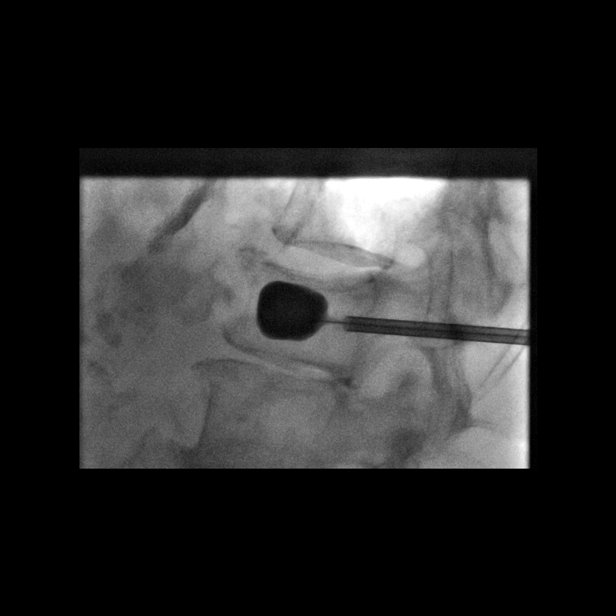
[im 12/16]
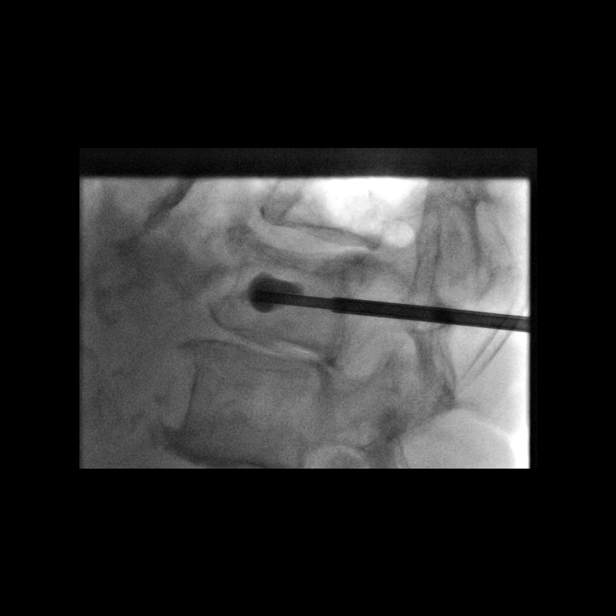
[im 14/16]
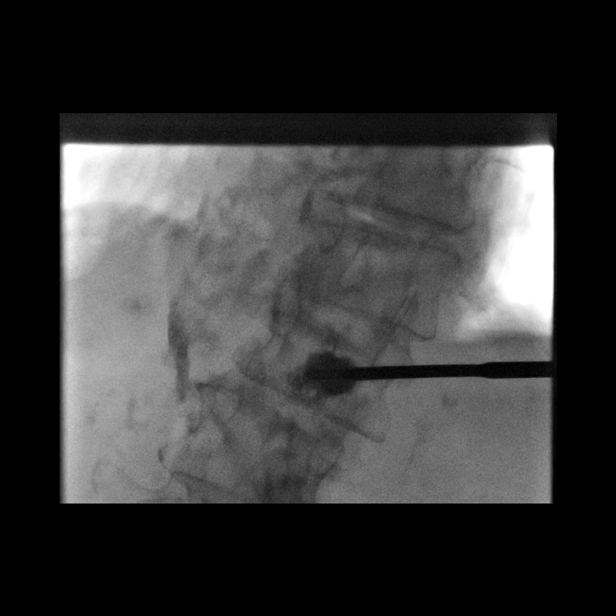
[im 16/16]
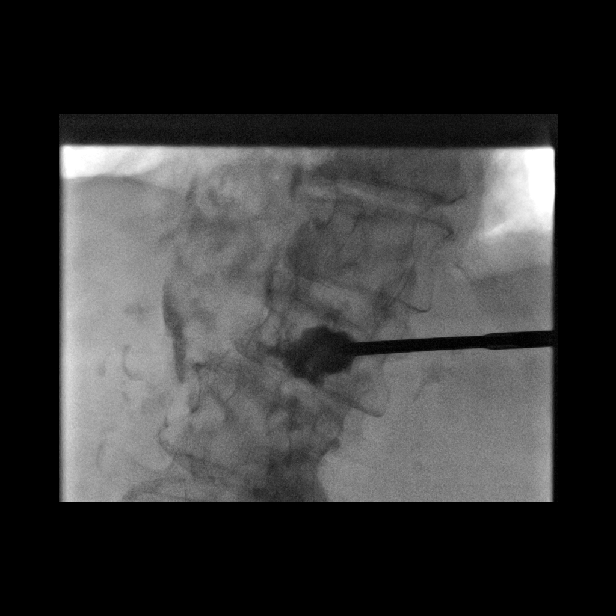

[13 of 24 positions shown; findings below may reference images not displayed]

The patient was placed prone on the fluoroscopic table. The skin
overlying the upper lumbar and sacral region was then prepped and
draped in the usual sterile fashion. Maximal barrier sterile
technique was utilized including caps, mask, sterile gowns, sterile
gloves, sterile drape, hand hygiene and skin antiseptic.

Intravenous Fentanyl [UH] and Versed 4mg were administered as
conscious sedation during continuous monitoring of the patient's
level of consciousness and physiological / cardiorespiratory status
by the radiology RN, with a total moderate sedation time of 37
minutes.

As antibiotic prophylaxis, cefazolin 2 g was ordered pre-procedure
and administered intravenously within !one hour! of incision.

Initially, fluoroscopic inspection of the sacrum was performed in
expectation of bilateral sacroplasty. However, due to a combination
of the patient's osteopenia, scoliosis, and overlying projection of
bowel gas and significant vascular calcifications on fluoroscopy,
the anatomic bony landmarks to allow safe sacroplasty under
fluoroscopy could not be confidently determined. Sacroplasty was
deferred.

The right pedicle at lumbar L1 was then infiltrated with 1%
lidocaine followed by the advancement of a Kyphon trocar needle
through the right pedicle into the posterior one-third. The trocar
was removed and the osteo drill was advanced to the anterior third
of the vertebral body. The osteo drill was retracted. Through the
working cannula, a Kyphon inflatable bone tamp 15 x 2 was advanced
and positioned with the distal marker 5 mm from the anterior aspect
of the cortex. Crossing of the midline was seen on the AP
projection. At this time, the balloon was expanded using contrast
via a Kyphon inflation syringe device via micro tubing.

Inflations were continued until there was near apposition with the
superior and the inferior end plates.

At this time, methylmethacrylate mixture was reconstituted in the
Kyphon bone mixing device system. This was then loaded into the
delivery mechanism, attached to Kyphon bone fillers.

The balloon was deflated and removed followed by the instillation of
methylmethacrylate mixture with excellent filling in the AP and
lateral projections. No extravasation was noted in the disk spaces
or posteriorly into the spinal canal. No epidural venous
contamination was seen.

The patient tolerated the procedure well. There were no acute
complications. The working cannulae and the bone filler were then
retrieved and removed.

COMPLICATIONS:
COMPLICATIONS
None immediate.
IMPRESSION: 1. Status post vertebral body augmentation using balloon kyphoplasty
at lumbar L1 as described without event.
2. Sacroplasty under fluoroscopy was deferred because of poor
visualization. We can reassess the patient next week and if pain
response inadequate, we can set up sacroplasty under CT guidance.
3. Per CMS PQRS reporting requirements (PQRS Measure 24): Given the
patient's age of greater than 50 and the fracture site (hip, distal
radius, or spine), the patient should be tested for osteoporosis
using DXA, and the appropriate treatment considered based on the DXA
results.

## 2019-07-11 MED ORDER — MIDAZOLAM HCL 2 MG/2ML IJ SOLN
INTRAMUSCULAR | Status: AC | PRN
  Administered 2019-07-11 (×4): 1 mg via INTRAVENOUS

## 2019-07-11 MED ORDER — FENTANYL CITRATE (PF) 100 MCG/2ML IJ SOLN
INTRAMUSCULAR | Status: AC | PRN
  Administered 2019-07-11 (×2): 50 ug via INTRAVENOUS

## 2019-07-11 MED ORDER — LIDOCAINE HCL (PF) 1 % IJ SOLN
INTRAMUSCULAR | Status: AC
  Filled 2019-07-11: qty 30

## 2019-07-11 MED ORDER — MIDAZOLAM HCL 2 MG/2ML IJ SOLN
INTRAMUSCULAR | Status: AC
  Filled 2019-07-11: qty 4

## 2019-07-11 MED ORDER — POTASSIUM CHLORIDE CRYS ER 20 MEQ PO TBCR
20.0000 meq | EXTENDED_RELEASE_TABLET | Freq: Every day | ORAL | Status: DC
  Administered 2019-07-11 – 2019-07-17 (×7): 20 meq via ORAL
  Filled 2019-07-11 (×7): qty 1

## 2019-07-11 MED ORDER — LIDOCAINE HCL (PF) 1 % IJ SOLN
INTRAMUSCULAR | Status: AC | PRN
  Administered 2019-07-11 (×3): 5 mL via INTRADERMAL

## 2019-07-11 MED ORDER — FENTANYL CITRATE (PF) 100 MCG/2ML IJ SOLN
INTRAMUSCULAR | Status: AC
  Filled 2019-07-11: qty 4

## 2019-07-11 MED ORDER — IOHEXOL 300 MG/ML  SOLN
50.0000 mL | Freq: Once | INTRAMUSCULAR | Status: AC | PRN
  Administered 2019-07-11: 50 mL

## 2019-07-11 MED ORDER — CEFAZOLIN SODIUM-DEXTROSE 2-4 GM/100ML-% IV SOLN
INTRAVENOUS | Status: AC
  Administered 2019-07-11: 2 g via INTRAVENOUS
  Filled 2019-07-11: qty 100

## 2019-07-11 MED ORDER — IOHEXOL 300 MG/ML  SOLN: 50.0000 mL | Freq: Once | INTRAMUSCULAR | Status: DC | PRN

## 2019-07-11 MED ORDER — CILOSTAZOL 100 MG PO TABS
50.0000 mg | ORAL_TABLET | Freq: Two times a day (BID) | ORAL | Status: DC
  Administered 2019-07-11 – 2019-07-18 (×14): 50 mg via ORAL
  Filled 2019-07-11 (×2): qty 0.5
  Filled 2019-07-11: qty 1
  Filled 2019-07-11 (×4): qty 0.5
  Filled 2019-07-11: qty 1
  Filled 2019-07-11 (×8): qty 0.5
  Filled 2019-07-11: qty 1
  Filled 2019-07-11 (×2): qty 0.5

## 2019-07-11 NOTE — Progress Notes (Signed)
PROGRESS NOTE    Jennifer [name]  ZOX:[id number] DOB: 1940-11-23 DOA: 07/05/2019 PCP: Binnie Rail, MD    Brief Narrative:  79 y.o.femalewith medical history significant ofhypertension, depression, GERD, hyperlipidemia, peripheral vascular diseaserecently diagnosed AAA followed by vascular surgery Dr. Scot Dock presents to the ED, c/o poor appetite, severe low back pain unable to lay back. Pain is 10 out of 10 worse with any movement. She was sent to orthopedic surgery where she had CT done with subsequent referral back to vascular surgeon 3 days ago. She was referred back to orthopedic surgery by vascular surgery to continue evaluation but pain is unbearable so patient came to the ER. Pt admitted to the hospital for pain control. Denied any trauma. In the ED, X-ray of the lumbar region showed scoliosis and multilevel degenerative change no evidence of acute abnormalities. Patient being admitted mainly for pain management.    Today, saw patient early this am, a little anxious about procedure. Denies any new complaints, other than persistent back pain.    Assessment & Plan:   Principal Problem:   Back pain Active Problems:   Dyslipidemia   Tobacco abuse   Essential hypertension   PAD (peripheral artery disease) (HCC)   Anxiety state   Abdominal aortic aneurysm (AAA) (HCC)  Severe back pain likely 2/2 acute/subacute compression fracture  MRI of lumbar spine showed L1 acute or subacute compression fracture with 25% height loss, diffuse degenerative disease with scoliosis, L4-5 moderate spinal stenosis, L5-S1 left foraminal impingement IR on board, s/p kyphoplasty on 07/11/19, deferred sacroplasty due to poor fluoro visualized anatomy (scoliosis, osteopenia). If symptoms persist, could perform under CT PT recommended CIR Pain management  Chronic hypokalemia Replace daily Daily BMP  Essential hypertension  Continue Norvasc  History of infrarenal abdominal aortic aneurysm    5.4 cm followed by vascular surgery, Dr. Scot Dock Follow-up ultrasound in 6 months  Anxiety disorder  Continue Xanax  Hyperlipidemia Continue statin  Peripheral vascular disease Resume cilostazol  Tobacco abuse Pt advised to quit    Estimated body mass index is 15.96 kg/m as calculated from the following:   Height as of this encounter: 5\' 4"  (1.626 m).   Weight as of this encounter: 42.2 kg.  DVT prophylaxis: SCDs Code Status: Full code Family Communication:  Discussed with patient, brother at bedside on 07/10/19   Disposition Plan:  Patient came from home, likely discharge to [facility] pending authorization   Consultants:   IR  Procedures: None Antimicrobials: None    Objective: Vitals:   07/11/19 1305 07/11/19 1310 07/11/19 1315 07/11/19 1320  BP: (!) 145/103 (!) 147/94 (!) 149/92 (!) 138/99  Pulse:      Resp: 20 17 (!) 21 19  Temp:      TempSrc:      SpO2: 100% 100% 100% 100%  Weight:      Height:        Intake/Output Summary (Last 24 hours) at 07/11/2019 1442 Last data filed at 07/11/2019 0600 Gross per 24 hour  Intake --  Output 1425 ml  Net -1425 ml   Filed Weights   07/05/19 1436  Weight: 42.2 kg    Examination:  General: NAD   Cardiovascular: S1, S2 present  Respiratory: CTAB  Abdomen: Soft, nontender, nondistended, bowel sounds present  Musculoskeletal: No bilateral pedal edema noted  Skin: Normal  Psychiatry: Normal mood    Data Reviewed: I have personally reviewed following labs and imaging studies  CBC: Recent Labs  Lab 07/05/19 1604 07/06/19 0556 07/09/19  8563 07/10/19 0556 07/11/19 0528  WBC 9.7 7.5 7.1 7.0 7.6  NEUTROABS 7.7  --   --  4.8 5.0  HGB 10.1* 9.0* 9.6* 10.0* 10.4*  HCT 30.2* 28.2* 30.2* 30.5* 32.5*  MCV 82.5 84.9 84.4 85.0 83.5  PLT 331 290 291 293 349   Basic Metabolic Panel: Recent Labs  Lab 07/05/19 1604 07/06/19 0556 07/09/19 0536 07/10/19 0556 07/11/19 0528  NA 134* 138 134* 132*  133*  K 3.3* 3.4* 2.6* 3.8 4.2  CL 98 105 99 100 98  CO2 23 23 26 24 26   GLUCOSE 109* 89 88 92 99  BUN 52* 37* 7* 9 15  CREATININE 1.17* 0.95 0.63 0.62 0.67  CALCIUM 9.4 8.4* 7.7* 7.9* 8.5*   GFR: Estimated Creatinine Clearance: 37.4 mL/min (by C-G formula based on SCr of 0.67 mg/dL). Liver Function Tests: Recent Labs  Lab 07/06/19 0556 07/09/19 0536  AST 33 27  ALT 14 14  ALKPHOS 69 81  BILITOT 0.5 0.6  PROT 6.1* 6.2*  ALBUMIN 2.6* 2.6*   No results for input(s): LIPASE, AMYLASE in the last 168 hours. No results for input(s): AMMONIA in the last 168 hours. Coagulation Profile: Recent Labs  Lab 07/10/19 0556  INR 1.1   Cardiac Enzymes: No results for input(s): CKTOTAL, CKMB, CKMBINDEX, TROPONINI in the last 168 hours. BNP (last 3 results) No results for input(s): PROBNP in the last 8760 hours. HbA1C: No results for input(s): HGBA1C in the last 72 hours. CBG: No results for input(s): GLUCAP in the last 168 hours. Lipid Profile: No results for input(s): CHOL, HDL, LDLCALC, TRIG, CHOLHDL, LDLDIRECT in the last 72 hours. Thyroid Function Tests: No results for input(s): TSH, T4TOTAL, FREET4, T3FREE, THYROIDAB in the last 72 hours. Anemia Panel: No results for input(s): VITAMINB12, FOLATE, FERRITIN, TIBC, IRON, RETICCTPCT in the last 72 hours. Sepsis Labs: No results for input(s): PROCALCITON, LATICACIDVEN in the last 168 hours.  Recent Results (from the past 240 hour(s))  SARS CORONAVIRUS 2 (TAT 6-24 HRS) Nasopharyngeal Nasopharyngeal Swab     Status: None   Collection Time: 07/05/19  6:06 PM   Specimen: Nasopharyngeal Swab  Result Value Ref Range Status   SARS Coronavirus 2 NEGATIVE NEGATIVE Final    Comment: (NOTE) SARS-CoV-2 target nucleic acids are NOT DETECTED. The SARS-CoV-2 RNA is generally detectable in upper and lower respiratory specimens during the acute phase of infection. Negative results do not preclude SARS-CoV-2 infection, do not rule  out co-infections with other pathogens, and should not be used as the sole basis for treatment or other patient management decisions. Negative results must be combined with clinical observations, patient history, and epidemiological information. The expected result is Negative. Fact Sheet for Patients: 07/07/19 Fact Sheet for Healthcare Providers: HairSlick.no This test is not yet approved or cleared by the quierodirigir.com FDA and  has been authorized for detection and/or diagnosis of SARS-CoV-2 by FDA under an Emergency Use Authorization (EUA). This EUA will remain  in effect (meaning this test can be used) for the duration of the COVID-19 declaration under Section 56 4(b)(1) of the Act, 21 U.S.C. section 360bbb-3(b)(1), unless the authorization is terminated or revoked sooner. Performed at [facility], [address], [place], [place]          Radiology Studies: No results found.      Scheduled Meds: . amLODipine  5 mg Oral Daily  . aspirin EC  81 mg Oral QAC breakfast  . atorvastatin  20 mg  Oral q1800  . bisacodyl  10 mg Oral Daily  . calcium-vitamin D  2 tablet Oral Daily  . docusate sodium  200 mg Oral Daily  . feeding supplement (ENSURE ENLIVE)  237 mL Oral BID BM  . gabapentin  100 mg Oral QHS  . lidocaine  1 patch Transdermal Q24H  . pantoprazole  40 mg Oral Daily  . potassium chloride SA  20 mEq Oral Daily   Continuous Infusions:    LOS: 6 days     Briant Cedar, MD  07/11/2019, 2:42 PM

## 2019-07-11 NOTE — Discharge Instructions (Addendum)
Balloon Kyphoplasty, Care After This sheet gives you information about how to care for yourself after your procedure. Your health care provider may also give you more specific instructions. If you have problems or questions, contact your health care provider. What can I expect after the procedure? After your procedure, it is common to have back pain. Follow these instructions at home: Medicines  Take over-the-counter and prescription medicines only as told by your health care provider.  Ask your health care provider if the medicine prescribed to you: ? Requires you to avoid driving or using heavy machinery. ? Can cause constipation. You may need to take steps to prevent or treat constipation, such as:  Drink enough fluid to keep your urine pale yellow.  Take over-the-counter or prescription medicines.  Eat foods that are high in fiber, such as beans, whole grains, and fresh fruits and vegetables.  Limit foods that are high in fat and processed sugars, such as fried or sweet foods. Puncture site care   Follow instructions from your health care provider about how to take care of your puncture site. Make sure you: ? Wash your hands with soap and water before and after you change your bandage (dressing). If soap and water are not available, use hand sanitizer. ? Change your dressing as told by your health care provider. ? Leave skin glue or adhesive strips in place. These skin closures may need to be in place for 2 weeks or longer. If adhesive strip edges start to loosen and curl up, you may trim the loose edges. Do not remove adhesive strips completely unless your health care provider tells you to do that.  Check your puncture site every day for signs of infection. Watch for: ? Redness, swelling, or pain. ? Fluid or blood. ? Warmth. ? Pus or a bad smell.  Keep your dressing dry until your health care provider says that it can be removed. Managing pain, stiffness, and swelling   If  directed, put ice on the painful area. ? Put ice in a plastic bag. ? Place a towel between your skin and the bag. ? Leave the ice on for 20 minutes, 2-3 times a day. Activity  Rest your back and avoid intense physical activity for as long as told by your health care provider.  Avoid bending, lifting, or twisting your back for as long as told by your health care provider.  Return to your normal activities as told by your health care provider. Ask your health care provider what activities are safe for you.  Do not lift anything that is heavier than 5 lb (2.2 kg). You may need to avoid heavy lifting for several weeks. General instructions  Do not use any products that contain nicotine or tobacco, such as cigarettes, e-cigarettes, and chewing tobacco. These can delay bone healing. If you need help quitting, ask your health care provider.  Do not drive for 24 hours if you were given a sedative during your procedure.  Keep all follow-up visits as told by your health care provider. This is important. Contact a health care provider if:  You have a fever or chills.  You have redness, swelling, or pain at the site of your puncture.  You have fluid, blood, or pus coming from the puncture site.  You have pain that gets worse or does not get better with medicine.  You develop numbness or weakness in any part of your body. Get help right away if:  You have chest pain.  You have difficulty breathing.  You have weakness, numbness, or tingling in your legs.  You cannot control your bladder or bowel movements.  You suddenly become weak or numb on one side of your body.  You become very confused.  You have trouble speaking or understanding, or both. Summary  Follow instructions from your health care provider about how to take care of your puncture site.  Take over-the-counter and prescription medicines only as told by your health care provider.  Rest your back and avoid intense  physical activity for as long as told by your health care provider.  Contact a health care provider if you have pain that gets worse or does not get better with medicine.  Keep all follow-up visits as told by your health care provider. This is important. This information is not intended to replace advice given to you by your health care provider. Make sure you discuss any questions you have with your health care provider. Document Revised: 03/18/2018 Document Reviewed: 03/18/2018 Elsevier Patient Education  2020 [employer].

## 2019-07-11 NOTE — Care Management Important Message (Signed)
Important Message  Patient Details IM Letter given to [facility] SW Case Mananger to present to the Patient Name: Jennifer [name] MRN: [id number] Date of Birth: [birth year]   Medicare Important Message Given:  Yes     Caren Macadam 07/11/2019, 10:13 AM

## 2019-07-11 NOTE — Procedures (Signed)
  Procedure: Kyphoplasty lumbar L1  EBL:   minimal Complications:  none immediate  See full dictation in [employer]. Sacroplasty deferred due to poor fluoro visualized anatomy (scoliosis, osteopenia); if symptoms persist we could perform under CT.   Thora Lance MD Main # [phone] Pager  [phone]

## 2019-07-12 LAB — CBC WITH DIFFERENTIAL/PLATELET
Abs Immature Granulocytes: 0.05 10*3/uL (ref 0.00–0.07)
Basophils Absolute: 0 10*3/uL (ref 0.0–0.1)
Basophils Relative: 0 %
Eosinophils Absolute: 0.1 10*3/uL (ref 0.0–0.5)
Eosinophils Relative: 1 %
HCT: 32.6 % — ABNORMAL LOW (ref 36.0–46.0)
Hemoglobin: 10.5 g/dL — ABNORMAL LOW (ref 12.0–15.0)
Immature Granulocytes: 1 %
Lymphocytes Relative: 17 %
Lymphs Abs: 1.5 10*3/uL (ref 0.7–4.0)
MCH: 27.1 pg (ref 26.0–34.0)
MCHC: 32.2 g/dL (ref 30.0–36.0)
MCV: 84 fL (ref 80.0–100.0)
Monocytes Absolute: 0.8 10*3/uL (ref 0.1–1.0)
Monocytes Relative: 8 %
Neutro Abs: 6.7 10*3/uL (ref 1.7–7.7)
Neutrophils Relative %: 73 %
Platelets: 375 10*3/uL (ref 150–400)
RBC: 3.88 MIL/uL (ref 3.87–5.11)
RDW: 15.1 % (ref 11.5–15.5)
WBC: 9.2 10*3/uL (ref 4.0–10.5)
nRBC: 0 % (ref 0.0–0.2)

## 2019-07-12 LAB — BASIC METABOLIC PANEL
Anion gap: 12 (ref 5–15)
BUN: 20 mg/dL (ref 8–23)
CO2: 24 mmol/L (ref 22–32)
Calcium: 9 mg/dL (ref 8.9–10.3)
Chloride: 98 mmol/L (ref 98–111)
Creatinine, Ser: 0.94 mg/dL (ref 0.44–1.00)
GFR calc Af Amer: >60 mL/min (ref >60)
GFR calc non Af Amer: 57 mL/min — ABNORMAL LOW (ref >60)
Glucose, Bld: 87 mg/dL (ref 70–99)
Potassium: 4.1 mmol/L (ref 3.5–5.1)
Sodium: 134 mmol/L — ABNORMAL LOW (ref 135–145)

## 2019-07-12 MED ORDER — ENOXAPARIN SODIUM 40 MG/0.4ML ~~LOC~~ SOLN: 40.0000 mg | SUBCUTANEOUS | Status: DC

## 2019-07-12 NOTE — Evaluation (Signed)
Occupational Therapy Evaluation Patient Details Name: Jennifer [name] MRN: [id number] DOB: [birth year] Today's Date: 07/12/2019    History of Present Illness 79 year old female admitted in increasing back pain, no known injury. PMH includes HTN, GERD,hyperlipidemia, PVD. Now s/p L1 kyphoplasty 07/11/2019   Clinical Impression   Pt admitted with the above diagnoses and presents with below problem list. Pt will benefit from continued acute OT to address the below listed deficits and maximize independence with basic ADLs prior to d/c to venue below. PTA pt was independent with ADLs. Pt is currently min A with most ADLs and with toilet and shower transfers, min guard with bathroom distance functional mobility utilizing rw. Up to chair at end of session.     Follow Up Recommendations  CIR    Equipment Recommendations  Other (comment)(defer to next venue)    Recommendations for Other Services       Precautions / Restrictions Precautions Precautions: Fall Restrictions Weight Bearing Restrictions: No      Mobility Bed Mobility Overal bed mobility: Needs Assistance Bed Mobility: Rolling;Sidelying to Sit Rolling: Min guard Sidelying to sit: Min guard;Min assist       General bed mobility comments: min guard for safety  Transfers Overall transfer level: Needs assistance Equipment used: Rolling walker (2 wheeled) Transfers: Sit to/from Stand Sit to Stand: Min assist         General transfer comment: to/from EOB, 3n1 over toilet, and recliner. assist to steady and control descent. Cues for technique and hand placement with rw.    Balance Overall balance assessment: Needs assistance   Sitting balance-Leahy Scale: Good       Standing balance-Leahy Scale: Fair                             ADL either performed or assessed with clinical judgement   ADL Overall ADL's : Needs assistance/impaired Eating/Feeding: Set up;Sitting   Grooming: Set up;Sitting    Upper Body Bathing: Minimal assistance   Lower Body Bathing: Minimal assistance;Sit to/from stand   Upper Body Dressing : Set up;Sitting   Lower Body Dressing: Minimal assistance;Sit to/from stand   Toilet Transfer: Minimal assistance;Ambulation;RW;Comfort height toilet;Grab bars   Toileting- Clothing Manipulation and Hygiene: Minimal assistance;Sit to/from stand   Tub/ Shower Transfer: Minimal assistance;Ambulation;Shower seat;Rolling walker   Functional mobility during ADLs: Min guard;Minimal assistance;Rolling walker General ADL Comments: Pt completed bed mobility and toilet transfer (walked to bathroom).      Vision         Perception     Praxis      Pertinent Vitals/Pain Pain Assessment: Faces Faces Pain Scale: Hurts whole lot Pain Location: low back and RLE mobilizing in the room Pain Descriptors / Indicators: Aching;Shooting;Constant     Hand Dominance Right   Extremity/Trunk Assessment Upper Extremity Assessment Upper Extremity Assessment: Generalized weakness;Overall [facility] for tasks assessed   Lower Extremity Assessment Lower Extremity Assessment: Defer to PT evaluation   Cervical / Trunk Assessment Cervical / Trunk Assessment: Kyphotic   Communication Communication Communication: No difficulties   Cognition Arousal/Alertness: Awake/alert Behavior During Therapy: WFL for tasks assessed/performed;Anxious Overall Cognitive Status: Within Functional Limits for tasks assessed                                 General Comments: endorses feeling anxious   General Comments       Exercises  Shoulder Instructions      Home Living Family/patient expects to be discharged to:: Private residence Living Arrangements: Alone Available Help at Discharge: Family(But per patient , no one can stay with her (can run errands ) Type of Home: Other(Comment)(townhouse) Home Access: Stairs to enter Entrance Stairs-Number of Steps: 3 Entrance  Stairs-Rails: Right;Left;Can reach both Home Layout: Two level     Bathroom Shower/Tub: Engineer, production Accessibility: Yes   Home Equipment: None(currently borrowing a rw)   Additional Comments: Commode standard height. She had been able to drive until recently      Prior Functioning/Environment Level of Independence: Independent                 OT Problem List: Decreased activity tolerance;Impaired balance (sitting and/or standing);Decreased knowledge of use of DME or AE;Decreased knowledge of precautions;Pain      OT Treatment/Interventions: Self-care/ADL training;DME and/or AE instruction;Therapeutic activities;Patient/family education;Balance training    OT Goals(Current goals can be found in the care plan section) Acute Rehab OT Goals Patient Stated Goal: To get well, and be painfree- get back to PLOF OT Goal Formulation: With patient Time For Goal Achievement: 07/26/19 Potential to Achieve Goals: Good ADL Goals Pt Will Perform Lower Body Bathing: with modified independence;sit to/from stand Pt Will Perform Lower Body Dressing: with modified independence;sit to/from stand Pt Will Transfer to Toilet: with modified independence;ambulating Pt Will Perform Toileting - Clothing Manipulation and hygiene: with modified independence;sit to/from stand Pt Will Perform Tub/Shower Transfer: with modified independence;ambulating;shower seat;rolling walker Additional ADL Goal #1: Pt will complete bed mobility at mod I level to prepare for OOB ADLs.  OT Frequency: Min 2X/week   Barriers to D/C:            Co-evaluation              AM-PAC OT "6 Clicks" Daily Activity     Outcome Measure Help from another person eating meals?: None Help from another person taking care of personal grooming?: None Help from another person toileting, which includes using toliet, bedpan, or urinal?: A Little Help from another person bathing (including washing, rinsing,  drying)?: A Little Help from another person to put on and taking off regular upper body clothing?: A Little Help from another person to put on and taking off regular lower body clothing?: A Little 6 Click Score: 20   End of Session Equipment Utilized During Treatment: Rolling walker Nurse Communication: Mobility status  Activity Tolerance: Patient limited by pain;Patient tolerated treatment well Patient left: in chair;with call bell/phone within reach  OT Visit Diagnosis: Unsteadiness on feet (R26.81);Pain                Time: 8144-8185 OT Time Calculation (min): 35 min Charges:  OT General Charges $OT Visit: 1 Visit OT Evaluation $OT Eval Low Complexity: 1 Low OT Treatments $Self Care/Home Management : 8-22 mins  Raynald Kemp, OT Acute Rehabilitation Services Pager: [phone] Office: [phone]   Pilar Grammes 07/12/2019, 4:40 PM

## 2019-07-12 NOTE — Progress Notes (Signed)
PROGRESS NOTE    Jennifer [name]  LZJ:[id number] DOB: 1941/04/20 DOA: 07/05/2019 PCP: Pincus Sanes, MD    Brief Narrative:  79 y.o.femalewith medical history significant ofhypertension, depression, GERD, hyperlipidemia, peripheral vascular diseaserecently diagnosed AAA followed by vascular surgery Dr. Edilia Bo presents to the ED, c/o poor appetite, severe low back pain unable to lay back. Pain is 10 out of 10 worse with any movement. She was sent to orthopedic surgery where she had CT done with subsequent referral back to vascular surgeon 3 days ago. She was referred back to orthopedic surgery by vascular surgery to continue evaluation but pain is unbearable so patient came to the ER. Pt admitted to the hospital for pain control. Denied any trauma. In the ED, X-ray of the lumbar region showed scoliosis and multilevel degenerative change no evidence of acute abnormalities. Patient being admitted mainly for pain management.    Today, patient denies any new complaints, reports improved back pain.  Denies any other complaints.    Assessment & Plan:   Principal Problem:   Back pain Active Problems:   Dyslipidemia   Tobacco abuse   Essential hypertension   PAD (peripheral artery disease) (HCC)   Anxiety state   Abdominal aortic aneurysm (AAA) (HCC)  Severe back pain likely 2/2 acute/subacute compression fracture  MRI of lumbar spine showed L1 acute or subacute compression fracture with 25% height loss, diffuse degenerative disease with scoliosis, L4-5 moderate spinal stenosis, L5-S1 left foraminal impingement IR on board, s/p kyphoplasty on 07/11/19, deferred sacroplasty due to poor fluoro visualized anatomy (scoliosis, osteopenia). If symptoms persist, could perform under CT PT recommended [facility] Pain management  Chronic hypokalemia Replace daily Daily BMP  Essential hypertension  Continue Norvasc  History of infrarenal abdominal aortic aneurysm  5.4 cm followed by  vascular surgery, Dr. Edilia Bo Follow-up ultrasound in 6 months  Anxiety disorder  Continue Xanax  Hyperlipidemia Continue statin  Peripheral vascular disease Resume cilostazol  Tobacco abuse Pt advised to quit    Estimated body mass index is 15.96 kg/m as calculated from the following:   Height as of this encounter: 5\' 4"  (1.626 m).   Weight as of this encounter: 42.2 kg.  DVT prophylaxis: Lovenox Code Status: Full code Family Communication:  Discussed with patient, brother at bedside on 07/10/19   Disposition Plan:  Patient came from home, likely discharge to CIR pending authorization   Consultants:   IR  Procedures: None Antimicrobials: None    Objective: Vitals:   07/11/19 1646 07/11/19 2043 07/12/19 0458 07/12/19 1503  BP: (!) 164/94 131/73 (!) 144/83 132/76  Pulse: 94 82 92 90  Resp: 19 19 19 18   Temp: 98 F (36.7 C) 97.9 F (36.6 C) 97.6 F (36.4 C) 97.8 F (36.6 C)  TempSrc: Oral Oral Oral Oral  SpO2: 91% 93% 94% 93%  Weight:      Height:        Intake/Output Summary (Last 24 hours) at 07/12/2019 1647 Last data filed at 07/12/2019 1500 Gross per 24 hour  Intake 140 ml  Output 950 ml  Net -810 ml   Filed Weights   07/05/19 1436  Weight: 42.2 kg    Examination:  General: NAD   Cardiovascular: S1, S2 present  Respiratory: CTAB  Abdomen: Soft, nontender, nondistended, bowel sounds present  Musculoskeletal: No bilateral pedal edema noted  Skin: Normal  Psychiatry: Normal mood    Data Reviewed: I have personally reviewed following labs and imaging studies  CBC: Recent Labs  Lab 07/05/19  1604 07/05/19 1604 07/06/19 0556 07/09/19 0536 07/10/19 0556 07/11/19 0528 07/12/19 0547  WBC 9.7   < > 7.5 7.1 7.0 7.6 9.2  NEUTROABS 7.7  --   --   --  4.8 5.0 6.7  HGB 10.1*   < > 9.0* 9.6* 10.0* 10.4* 10.5*  HCT 30.2*   < > 28.2* 30.2* 30.5* 32.5* 32.6*  MCV 82.5   < > 84.9 84.4 85.0 83.5 84.0  PLT 331   < > 290 291 293 349  375   < > = values in this interval not displayed.   Basic Metabolic Panel: Recent Labs  Lab 07/06/19 0556 07/09/19 0536 07/10/19 0556 07/11/19 0528 07/12/19 0547  NA 138 134* 132* 133* 134*  K 3.4* 2.6* 3.8 4.2 4.1  CL 105 99 100 98 98  CO2 23 26 24 26 24   GLUCOSE 89 88 92 99 87  BUN 37* 7* 9 15 20   CREATININE 0.95 0.63 0.62 0.67 0.94  CALCIUM 8.4* 7.7* 7.9* 8.5* 9.0   GFR: Estimated Creatinine Clearance: 31.8 mL/min (by C-G formula based on SCr of 0.94 mg/dL). Liver Function Tests: Recent Labs  Lab 07/06/19 0556 07/09/19 0536  AST 33 27  ALT 14 14  ALKPHOS 69 81  BILITOT 0.5 0.6  PROT 6.1* 6.2*  ALBUMIN 2.6* 2.6*   No results for input(s): LIPASE, AMYLASE in the last 168 hours. No results for input(s): AMMONIA in the last 168 hours. Coagulation Profile: Recent Labs  Lab 07/10/19 0556  INR 1.1   Cardiac Enzymes: No results for input(s): CKTOTAL, CKMB, CKMBINDEX, TROPONINI in the last 168 hours. BNP (last 3 results) No results for input(s): PROBNP in the last 8760 hours. HbA1C: No results for input(s): HGBA1C in the last 72 hours. CBG: No results for input(s): GLUCAP in the last 168 hours. Lipid Profile: No results for input(s): CHOL, HDL, LDLCALC, TRIG, CHOLHDL, LDLDIRECT in the last 72 hours. Thyroid Function Tests: No results for input(s): TSH, T4TOTAL, FREET4, T3FREE, THYROIDAB in the last 72 hours. Anemia Panel: No results for input(s): VITAMINB12, FOLATE, FERRITIN, TIBC, IRON, RETICCTPCT in the last 72 hours. Sepsis Labs: No results for input(s): PROCALCITON, LATICACIDVEN in the last 168 hours.  Recent Results (from the past 240 hour(s))  SARS CORONAVIRUS 2 (TAT 6-24 HRS) Nasopharyngeal Nasopharyngeal Swab     Status: None   Collection Time: 07/05/19  6:06 PM   Specimen: Nasopharyngeal Swab  Result Value Ref Range Status   SARS Coronavirus 2 NEGATIVE NEGATIVE Final    Comment: (NOTE) SARS-CoV-2 target nucleic acids are NOT DETECTED. The  SARS-CoV-2 RNA is generally detectable in upper and lower respiratory specimens during the acute phase of infection. Negative results do not preclude SARS-CoV-2 infection, do not rule out co-infections with other pathogens, and should not be used as the sole basis for treatment or other patient management decisions. Negative results must be combined with clinical observations, patient history, and epidemiological information. The expected result is Negative. Fact Sheet for Patients: 07/12/19 Fact Sheet for Healthcare Providers: 07/07/19 This test is not yet approved or cleared by the [employer] FDA and  has been authorized for detection and/or diagnosis of SARS-CoV-2 by FDA under an Emergency Use Authorization (EUA). This EUA will remain  in effect (meaning this test can be used) for the duration of the COVID-19 declaration under Section 56 4(b)(1) of the Act, 21 U.S.C. section 360bbb-3(b)(1), unless the authorization is terminated or revoked sooner. Performed at [facility], [address], [place], [place] [address]          Radiology Studies: IR KYPHO LUMBAR INC FX REDUCE BONE BX UNI/BIL CANNULATION INC/IMAGING  Result Date: 07/11/2019 CLINICAL DATA:  Low back pain. MR demonstrates subacute unhealed L1 compression fracture deformity, and bilateral sacral ala insufficiency fractures. EXAM: KYPHOPLASTY AT LUMBAR L1 TECHNIQUE: The procedure, risks (including but not limited to bleeding, infection, organ damage), benefits, and alternatives were explained to the patient. Questions regarding the procedure were encouraged and answered. The patient understands and consents to the procedure. The patient was placed prone on the fluoroscopic table. The skin overlying the upper lumbar and sacral region was then prepped and draped in the usual sterile fashion. Maximal barrier sterile technique was utilized including  caps, mask, sterile gowns, sterile gloves, sterile drape, hand hygiene and skin antiseptic. Intravenous Fentanyl and Versed 4mg  were administered as conscious sedation during continuous monitoring of the patient's level of consciousness and physiological / cardiorespiratory status by the radiology RN, with a total moderate sedation time of 37 minutes. As antibiotic prophylaxis, cefazolin 2 g was ordered pre-procedure and administered intravenously within !one hour! of incision. Initially, fluoroscopic inspection of the sacrum was performed in expectation of bilateral sacroplasty. However, due to a combination of the patient's osteopenia, scoliosis, and overlying projection of bowel gas and significant vascular calcifications on fluoroscopy, the anatomic bony landmarks to allow safe sacroplasty under fluoroscopy could not be confidently determined. Sacroplasty was deferred. The right pedicle at lumbar L1 was then infiltrated with 1% lidocaine followed by the advancement of a Kyphon trocar needle through the right pedicle into the posterior one-third. The trocar was removed and the osteo drill was advanced to the anterior third of the vertebral body. The osteo drill was retracted. Through the working cannula, a Kyphon inflatable bone tamp 15 x 2 was advanced and positioned with the distal marker 5 mm from the anterior aspect of the cortex. Crossing of the midline was seen on the AP projection. At this time, the balloon was expanded using contrast via a Kyphon inflation syringe device via micro tubing. Inflations were continued until there was near apposition with the superior and the inferior end plates. At this time, methylmethacrylate mixture was reconstituted in the Kyphon bone mixing device system. This was then loaded into the delivery mechanism, attached to Kyphon bone fillers. The balloon was deflated and removed followed by the instillation of methylmethacrylate mixture with excellent filling in the AP  and lateral projections. No extravasation was noted in the disk spaces or posteriorly into the spinal canal. No epidural venous contamination was seen. The patient tolerated the procedure well. There were no acute complications. The working cannulae and the bone filler were then retrieved and removed. COMPLICATIONS: COMPLICATIONS None immediate. IMPRESSION: 1. Status post vertebral body augmentation using balloon kyphoplasty at lumbar L1 as described without event. 2. Sacroplasty under fluoroscopy was deferred because of poor visualization. We can reassess the patient next week and if pain response inadequate, we can set up sacroplasty under CT guidance. 3. Per CMS PQRS reporting requirements (PQRS Measure 24): Given the patient's age of greater than 50 and the fracture site (hip, distal radius, or spine), the patient should be tested for osteoporosis using DXA, and the appropriate treatment considered based on the DXA results. Electronically Signed   By: M.D.   On: 07/11/2019 16:39        Scheduled Meds: . amLODipine  5 mg Oral Daily  . aspirin EC  81 mg  Oral QAC breakfast  . atorvastatin  20 mg Oral q1800  . bisacodyl  10 mg Oral Daily  . calcium-vitamin D  2 tablet Oral Daily  . cilostazol  50 mg Oral BID  . docusate sodium  200 mg Oral Daily  . feeding supplement (ENSURE ENLIVE)  237 mL Oral BID BM  . gabapentin  100 mg Oral QHS  . lidocaine  1 patch Transdermal Q24H  . pantoprazole  40 mg Oral Daily  . potassium chloride SA  20 mEq Oral Daily   Continuous Infusions:    LOS: 7 days     Alma Friendly, MD  07/12/2019, 4:47 PM

## 2019-07-13 MED ORDER — ENOXAPARIN SODIUM 30 MG/0.3ML ~~LOC~~ SOLN
30.0000 mg | SUBCUTANEOUS | Status: DC
  Administered 2019-07-13 – 2019-07-17 (×5): 30 mg via SUBCUTANEOUS
  Filled 2019-07-13 (×5): qty 0.3

## 2019-07-13 NOTE — Progress Notes (Signed)
PROGRESS NOTE    Jennifer [name]  LFY:[id number] DOB: [birth year] DOA: 07/05/2019 PCP: Pincus Sanes, MD    Brief Narrative:  79 y.o.femalewith medical history significant ofhypertension, depression, GERD, hyperlipidemia, peripheral vascular diseaserecently diagnosed AAA followed by vascular surgery Dr. Edilia Bo presents to the ED, c/o poor appetite, severe low back pain unable to lay back. Pain is 10 out of 10 worse with any movement. She was sent to orthopedic surgery where she had CT done with subsequent referral back to vascular surgeon 3 days ago. She was referred back to orthopedic surgery by vascular surgery to continue evaluation but pain is unbearable so patient came to the ER. Pt admitted to the hospital for pain control. Denied any trauma. In the ED, X-ray of the lumbar region showed scoliosis and multilevel degenerative change no evidence of acute abnormalities. Patient being admitted mainly for pain management.    Today, patient denies any new complaints.  Still having some back pain, improved with pain meds.  Patient denies any chest pain, abdominal pain, nausea/vomiting, fever/chills.    Assessment & Plan:   Principal Problem:   Back pain Active Problems:   Dyslipidemia   Tobacco abuse   Essential hypertension   PAD (peripheral artery disease) (HCC)   Anxiety state   Abdominal aortic aneurysm (AAA) (HCC)  Severe back pain likely 2/2 acute/subacute compression fracture  MRI of lumbar spine showed L1 acute or subacute compression fracture with 25% height loss, diffuse degenerative disease with scoliosis, L4-5 moderate spinal stenosis, L5-S1 left foraminal impingement IR on board, s/p kyphoplasty on 07/11/19, deferred sacroplasty due to poor fluoro visualized anatomy (scoliosis, osteopenia). If symptoms persist, could perform under CT PT recommended CIR Pain management  Chronic hypokalemia Replace daily Daily BMP  Essential hypertension  Continue  Norvasc  History of infrarenal abdominal aortic aneurysm  5.4 cm followed by vascular surgery, Dr. Edilia Bo Follow-up ultrasound in 6 months  Anxiety disorder  Continue Xanax  Hyperlipidemia Continue statin  Peripheral vascular disease Resume cilostazol  Tobacco abuse Pt advised to quit    Estimated body mass index is 15.96 kg/m as calculated from the following:   Height as of this encounter: 5\' 4"  (1.626 m).   Weight as of this encounter: 42.2 kg.  DVT prophylaxis: Lovenox Code Status: Full code Family Communication:  Discussed with patient, brother at bedside on 07/10/19   Disposition Plan:  Patient came from home, likely discharge to CIR pending authorization   Consultants:   IR  Procedures: None Antimicrobials: None    Objective: Vitals:   07/12/19 1503 07/12/19 2029 07/13/19 0539 07/13/19 0956  BP: 132/76 108/69 116/82 120/78  Pulse: 90 81 71   Resp: 18 16 18    Temp: 97.8 F (36.6 C) 97.7 F (36.5 C) 98 F (36.7 C)   TempSrc: Oral Oral Oral   SpO2: 93% 98% 94%   Weight:      Height:        Intake/Output Summary (Last 24 hours) at 07/13/2019 1358 Last data filed at 07/12/2019 1500 Gross per 24 hour  Intake --  Output 400 ml  Net -400 ml   Filed Weights   07/05/19 1436  Weight: 42.2 kg    Examination:  General: NAD   Cardiovascular: S1, S2 present  Respiratory: CTAB  Abdomen: Soft, nontender, nondistended, bowel sounds present  Musculoskeletal: No bilateral pedal edema noted  Skin: Normal  Psychiatry: Normal mood    Data Reviewed: I have personally reviewed following labs and imaging studies  CBC:  Recent Labs  Lab 07/09/19 0536 07/10/19 0556 07/11/19 0528 07/12/19 0547  WBC 7.1 7.0 7.6 9.2  NEUTROABS  --  4.8 5.0 6.7  HGB 9.6* 10.0* 10.4* 10.5*  HCT 30.2* 30.5* 32.5* 32.6*  MCV 84.4 85.0 83.5 84.0  PLT 291 293 349 270   Basic Metabolic Panel: Recent Labs  Lab 07/09/19 0536 07/10/19 0556 07/11/19 0528  07/12/19 0547  NA 134* 132* 133* 134*  K 2.6* 3.8 4.2 4.1  CL 99 100 98 98  CO2 26 24 26 24   GLUCOSE 88 92 99 87  BUN 7* 9 15 20   CREATININE 0.63 0.62 0.67 0.94  CALCIUM 7.7* 7.9* 8.5* 9.0   GFR: Estimated Creatinine Clearance: 31.8 mL/min (by C-G formula based on SCr of 0.94 mg/dL). Liver Function Tests: Recent Labs  Lab 07/09/19 0536  AST 27  ALT 14  ALKPHOS 81  BILITOT 0.6  PROT 6.2*  ALBUMIN 2.6*   No results for input(s): LIPASE, AMYLASE in the last 168 hours. No results for input(s): AMMONIA in the last 168 hours. Coagulation Profile: Recent Labs  Lab 07/10/19 0556  INR 1.1   Cardiac Enzymes: No results for input(s): CKTOTAL, CKMB, CKMBINDEX, TROPONINI in the last 168 hours. BNP (last 3 results) No results for input(s): PROBNP in the last 8760 hours. HbA1C: No results for input(s): HGBA1C in the last 72 hours. CBG: No results for input(s): GLUCAP in the last 168 hours. Lipid Profile: No results for input(s): CHOL, HDL, LDLCALC, TRIG, CHOLHDL, LDLDIRECT in the last 72 hours. Thyroid Function Tests: No results for input(s): TSH, T4TOTAL, FREET4, T3FREE, THYROIDAB in the last 72 hours. Anemia Panel: No results for input(s): VITAMINB12, FOLATE, FERRITIN, TIBC, IRON, RETICCTPCT in the last 72 hours. Sepsis Labs: No results for input(s): PROCALCITON, LATICACIDVEN in the last 168 hours.  Recent Results (from the past 240 hour(s))  SARS CORONAVIRUS 2 (TAT 6-24 HRS) Nasopharyngeal Nasopharyngeal Swab     Status: None   Collection Time: 07/05/19  6:06 PM   Specimen: Nasopharyngeal Swab  Result Value Ref Range Status   SARS Coronavirus 2 NEGATIVE NEGATIVE Final    Comment: (NOTE) SARS-CoV-2 target nucleic acids are NOT DETECTED. The SARS-CoV-2 RNA is generally detectable in upper and lower respiratory specimens during the acute phase of infection. Negative results do not preclude SARS-CoV-2 infection, do not rule out co-infections with other pathogens, and  should not be used as the sole basis for treatment or other patient management decisions. Negative results must be combined with clinical observations, patient history, and epidemiological information. The expected result is Negative. Fact Sheet for Patients: [employer] Fact Sheet for Healthcare Providers: https://www.woods-mathews.com/ This test is not yet approved or cleared by the [place] FDA and  has been authorized for detection and/or diagnosis of SARS-CoV-2 by FDA under an Emergency Use Authorization (EUA). This EUA will remain  in effect (meaning this test can be used) for the duration of the COVID-19 declaration under Section 56 4(b)(1) of the Act, 21 U.S.C. section 360bbb-3(b)(1), unless the authorization is terminated or revoked sooner. Performed at [facility], [place] [address], [place], [place] [address]          Radiology Studies: No results found.      Scheduled Meds: . amLODipine  5 mg Oral Daily  . aspirin EC  81 mg Oral QAC breakfast  . atorvastatin  20 mg Oral q1800  . bisacodyl  10 mg Oral Daily  . calcium-vitamin D  2 tablet Oral Daily  .  cilostazol  50 mg Oral BID  . docusate sodium  200 mg Oral Daily  . enoxaparin (LOVENOX) injection  30 mg Subcutaneous Q24H  . feeding supplement (ENSURE ENLIVE)  237 mL Oral BID BM  . gabapentin  100 mg Oral QHS  . lidocaine  1 patch Transdermal Q24H  . pantoprazole  40 mg Oral Daily  . potassium chloride SA  20 mEq Oral Daily   Continuous Infusions:    LOS: 8 days     Briant Cedar, MD  07/13/2019, 1:58 PM

## 2019-07-14 NOTE — Care Management Important Message (Signed)
Important Message  Patient Details IM Letter given to Daryel Gerald SW Case Manager to present to the Patient Name: Jennifer [name] MRN: [id number] Date of Birth: Dec 26, 1940   Medicare Important Message Given:  Yes     Caren Macadam 07/14/2019, 11:13 AM

## 2019-07-14 NOTE — Progress Notes (Signed)
Physical Therapy Treatment Patient Details Name: Jennifer [name] MRN: [id number] DOB: [birth year] Today's Date: 07/14/2019    History of Present Illness 79 year old female admitted in increasing back pain, no known injury. PMH includes HTN, GERD,hyperlipidemia, PVD. Now s/p L1 kyphoplasty 07/11/2019    PT Comments    Patient resting in bed when PT arrived. She was receptive to OOB and wanted to walk. She requires min-mod A for all bed mobility, still needing practice in log rolling. Able to sit on edge of bed x 5 minutes to acclimate, with verbal cues in postural awareness. Complains of discomfort in LEFT hip, buttock area. Min-Mod A sit<>stand with RW, and min A for ambulation in hallway with cues for safety with RW. On return to room, agreed to sit in [facility] chair- practiced LE ex as noted. Reminded to practice IS and HEP in between sessions. Remained in BS chair- Remote/call light in reach, telephone in reach. BS table over lap (covers in place) and LEs elevated. She should benefit from PT for optimal functional outcomes. Recommend CIR, and she is very motivated. Is not currently safe enough to return to home setting.  Follow Up Recommendations  CIR     Equipment Recommendations  Rolling walker with 5" wheels    Recommendations for Other Services       Precautions / Restrictions Precautions Precautions: Fall Precaution Comments: She is at risk for falls, but improving in her ambulation s/p surgery (07/11/2019) Restrictions Weight Bearing Restrictions: No    Mobility  Bed Mobility Overal bed mobility: Needs Assistance Bed Mobility: Rolling;Sidelying to Sit Rolling: Min guard Sidelying to sit: Min guard Supine to sit: Min assist;Mod assist(still need practice in log rolling)     General bed mobility comments: Min to mod Assist overall in bed mobility- still needs practice with log rolling  Transfers Overall transfer level: Needs assistance Equipment used: Rolling walker (2  wheeled) Transfers: Sit to/from Stand Sit to Stand: Min guard;Min assist         General transfer comment: verbal cues for body mechanics for eccentric control when sitting onto edge of bed  Ambulation/Gait Ambulation/Gait assistance: Min assist;Mod assist Gait Distance (Feet): 62 Feet Assistive device: Rolling walker (2 wheeled) Gait Pattern/deviations: Shuffle;Step-to pattern   Gait velocity interpretation: 1.31 - 2.62 ft/sec, indicative of limited community Industrial/product [employer] (Stroke Patients Only)       Balance Overall balance assessment: Needs assistance Sitting-balance support: No upper extremity supported;Feet supported Sitting balance-Leahy Scale: Good     Standing balance support: Bilateral upper extremity supported Standing balance-Leahy Scale: Fair(F+,G- with RW) Standing balance comment: reliant on B UE support                            Cognition Arousal/Alertness: Awake/alert Behavior During Therapy: WFL for tasks assessed/performed;Anxious Overall Cognitive Status: Within Functional Limits for tasks assessed                                 General Comments: She is steadily improving overall from recentl surgery on 07/11/2019- and is receptive and should benefit from [facility]- recommend CIR if at all possible.      Exercises General Exercises - Lower Extremity Ankle Circles/Pumps: AROM;Seated;10 reps;Both Quad Sets: AROM;Seated;10 reps;Both Hip ABduction/ADduction: AROM;Seated;Both;10  reps Other Exercises Other Exercises: Reminded to perform ex as instructed and printed sheet was provided. Other Exercises: Practiced IS and able to achieve 750 with good breath control, good return demo    General Comments General comments (skin integrity, edema, etc.): Monitor skin and joint integrity - does complain of discomfort in left hip, and  into buttock      Pertinent  Vitals/Pain Pain Assessment: Faces Pain Score: 3  Faces Pain Scale: Hurts even more Pain Location: essentially left hip/thigh Pain Descriptors / Indicators: Aching;Pressure Pain Intervention(s): Monitored during session    Home Living                      Prior Function            PT Goals (current goals can now be found in the care plan section) Acute Rehab PT Goals Patient Stated Goal: To get well, and be painfree- get back to PLOF PT Goal Formulation: With patient Time For Goal Achievement: 07/21/19 Progress towards PT goals: Progressing toward goals    Frequency    Min 2X/week      PT Plan Current plan remains appropriate    Co-evaluation              AM-PAC PT "6 Clicks" Mobility   Outcome Measure  Help needed turning from your back to your side while in a flat bed without using bedrails?: A Lot Help needed moving from lying on your back to sitting on the side of a flat bed without using bedrails?: A Lot Help needed moving to and from a bed to a chair (including a wheelchair)?: A Little Help needed standing up from a chair using your arms (e.g., wheelchair or bedside chair)?: A Little Help needed to walk in hospital room?: A Little Help needed climbing 3-5 steps with a railing? : A Lot 6 Click Score: 15    End of Session   Activity Tolerance: Patient tolerated treatment well Patient left: in chair;with call bell/phone within reach;with chair alarm set Nurse Communication: Mobility status PT Visit Diagnosis: Muscle weakness (generalized) (M62.81);Other abnormalities of gait and mobility (R26.89);Unsteadiness on feet (R26.81)     Time: 4132-4401 PT Time Calculation (min) (ACUTE ONLY): 35 min  Charges:  $Gait Training: 8-22 mins $Therapeutic Exercise: 8-22 mins                     Rollen Sox, PT # [phone] CGV cell  Casandra Doffing 07/14/2019, 3:27 PM

## 2019-07-14 NOTE — Progress Notes (Signed)
Referring Physician(s): Mathews,E  Supervising Physician: Richarda Overlie  Patient Status:  [facility] - In-pt  Chief Complaint:  Back/sacral  pain  Subjective: Pt states she feels quite a bit better since L1 kyphoplasty done on 3/19.  She still has some pain in her sacral region exacerbated with movement along with radiation down left lateral thigh region but not acutely worsening.   Allergies: Patient has no known allergies.  Medications: Prior to Admission medications   Medication Sig Start Date End Date Taking? Authorizing Provider  ALPRAZolam (XANAX) 0.25 MG tablet TAKE 1 TABLET BY MOUTH EVERY NIGHT AT BEDTIME AS NEEDED FOR ANXIETY OR SLEEP Patient taking differently: Take 0.25 mg by mouth at bedtime as needed for anxiety or sleep.  02/05/19  Yes Burns, Bobette Mo, MD  amLODipine (NORVASC) 5 MG tablet TAKE 1 TABLET BY MOUTH EVERY NIGHT AT BEDTIME Patient taking differently: Take 5 mg by mouth at bedtime.  06/03/19  Yes Burns, Bobette Mo, MD  aspirin 81 MG tablet Take 81 mg by mouth daily before breakfast.    Yes [provider]  atorvastatin (LIPITOR) 20 MG tablet TAKE 1 TABLET BY MOUTH EVERY DAY Patient taking differently: Take 20 mg by mouth daily at 6 PM.  05/05/19  Yes Burns, Bobette Mo, MD  calcium-vitamin D (OSCAL WITH D) 500-200 MG-UNIT per tablet Take 2 tablets by mouth daily.   Yes [provider]  cilostazol (PLETAL) 50 MG tablet Take 1 tablet (50 mg total) by mouth 2 (two) times daily. 06/11/19  Yes Burns, Bobette Mo, MD  gabapentin (NEURONTIN) 100 MG capsule Take 1-3 capsules (100-300 mg total) by mouth at bedtime. Patient taking differently: Take 100 mg by mouth at bedtime.  06/27/19  Yes Burns, Bobette Mo, MD  HYDROcodone-acetaminophen (NORCO) 7.5-325 MG tablet Take 1 tablet by mouth every 4 (four) hours as needed for moderate pain. 06/27/19  Yes Burns, Bobette Mo, MD  omeprazole (PRILOSEC) 40 MG capsule Take 1 capsule (40 mg total) by mouth daily. Patient needs office visit for  further refills 06/11/19  Yes Hilarie Fredrickson, MD  potassium chloride SA (K-DUR) 20 MEQ tablet TAKE 1 TABLET(20 MEQ) BY MOUTH DAILY Patient taking differently: Take 20 mEq by mouth daily.  11/12/18  Yes Burns, Bobette Mo, MD  Diclofenac Sodium 1 % CREA Apply 1 application topically 2 (two) times daily. Patient not taking: Reported on 07/05/2019 06/03/19   Felicie Morn, NP     Vital Signs: BP 92/61 (BP Location: Right Arm)   Pulse 79   Temp 98.2 F (36.8 C) (Oral)   Resp 20   Ht 5\' 4"  (1.626 m)   Wt 93 lb (42.2 kg)   SpO2 98%   BMI 15.96 kg/m   Physical Exam awake, alert.  Puncture site L1 region clean, dry, not significant tender, no hematoma.  No new neuro findings.  Still slightly tender in sacral region.  Imaging: IR KYPHO LUMBAR INC FX REDUCE BONE BX UNI/BIL CANNULATION INC/IMAGING  Result Date: 07/11/2019 CLINICAL DATA:  Low back pain. MR demonstrates subacute unhealed L1 compression fracture deformity, and bilateral sacral ala insufficiency fractures. EXAM: KYPHOPLASTY AT LUMBAR L1 TECHNIQUE: The procedure, risks (including but not limited to bleeding, infection, organ damage), benefits, and alternatives were explained to the patient. Questions regarding the procedure were encouraged and answered. The patient understands and consents to the procedure. The patient was placed prone on the fluoroscopic table. The skin overlying the upper lumbar and sacral region was then prepped and draped  in the usual sterile fashion. Maximal barrier sterile technique was utilized including caps, mask, sterile gowns, sterile gloves, sterile drape, hand hygiene and skin antiseptic. Intravenous Fentanyl 154mcg and Versed 4mg  were administered as conscious sedation during continuous monitoring of the patient's level of consciousness and physiological / cardiorespiratory status by the radiology RN, with a total moderate sedation time of 37 minutes. As antibiotic prophylaxis, cefazolin 2 g was ordered pre-procedure and  administered intravenously within !one hour! of incision. Initially, fluoroscopic inspection of the sacrum was performed in expectation of bilateral sacroplasty. However, due to a combination of the patient's osteopenia, scoliosis, and overlying projection of bowel gas and significant vascular calcifications on fluoroscopy, the anatomic bony landmarks to allow safe sacroplasty under fluoroscopy could not be confidently determined. Sacroplasty was deferred. The right pedicle at lumbar L1 was then infiltrated with 1% lidocaine followed by the advancement of a Kyphon trocar needle through the right pedicle into the posterior one-third. The trocar was removed and the osteo drill was advanced to the anterior third of the vertebral body. The osteo drill was retracted. Through the working cannula, a Kyphon inflatable bone tamp 15 x 2 was advanced and positioned with the distal marker 5 mm from the anterior aspect of the cortex. Crossing of the midline was seen on the AP projection. At this time, the balloon was expanded using contrast via a Kyphon inflation syringe device via micro tubing. Inflations were continued until there was near apposition with the superior and the inferior end plates. At this time, methylmethacrylate mixture was reconstituted in the Kyphon bone mixing device system. This was then loaded into the delivery mechanism, attached to Kyphon bone fillers. The balloon was deflated and removed followed by the instillation of methylmethacrylate mixture with excellent filling in the AP and lateral projections. No extravasation was noted in the disk spaces or posteriorly into the spinal canal. No epidural venous contamination was seen. The patient tolerated the procedure well. There were no acute complications. The working cannulae and the bone filler were then retrieved and removed. COMPLICATIONS: COMPLICATIONS None immediate. IMPRESSION: 1. Status post vertebral body augmentation using balloon kyphoplasty at  lumbar L1 as described without event. 2. Sacroplasty under fluoroscopy was deferred because of poor visualization. We can reassess the patient next week and if pain response inadequate, we can set up sacroplasty under CT guidance. 3. Per CMS PQRS reporting requirements (PQRS Measure 24): Given the patient's age of greater than 28 and the fracture site (hip, distal radius, or spine), the patient should be tested for osteoporosis using DXA, and the appropriate treatment considered based on the DXA results. Electronically Signed   By: Lucrezia Europe M.D.   On: 07/11/2019 16:39    Labs:  CBC: Recent Labs    07/09/19 0536 07/10/19 0556 07/11/19 0528 07/12/19 0547  WBC 7.1 7.0 7.6 9.2  HGB 9.6* 10.0* 10.4* 10.5*  HCT 30.2* 30.5* 32.5* 32.6*  PLT 291 293 349 375    COAGS: Recent Labs    07/10/19 0556  INR 1.1    BMP: Recent Labs    07/09/19 0536 07/10/19 0556 07/11/19 0528 07/12/19 0547  NA 134* 132* 133* 134*  K 2.6* 3.8 4.2 4.1  CL 99 100 98 98  CO2 26 24 26 24   GLUCOSE 88 92 99 87  BUN 7* 9 15 20   CALCIUM 7.7* 7.9* 8.5* 9.0  CREATININE 0.63 0.62 0.67 0.94  GFRNONAA >60 >60 >60 57*  GFRAA >60 >60 >60 >60    LIVER  FUNCTION TESTS: Recent Labs    11/01/18 1555 05/07/19 1453 07/06/19 0556 07/09/19 0536  BILITOT 0.4 0.4 0.5 0.6  AST 19 20 33 27  ALT 10 8 14 14   ALKPHOS 48 52 69 81  PROT 8.4* 7.7 6.1* 6.2*  ALBUMIN 4.3 3.9 2.6* 2.6*    Assessment and Plan: Patient with history of low back pain, L1 compression fracture along with bilateral sacral insufficiency fractures; status post L1 KP on 3/19; sacroplasty was deferred because of poor visualization with fluoroscopy; patient reports improvement in back pain with recent procedure.  Continues to have some sacral discomfort; continue with PT; if patient has worsening sacral pain following trial of PT could consider sacroplasty under CT guidance.   Electronically Signed: D. 4/19, PA-C 07/14/2019, 4:10 PM   I  spent a total of 15 minutes at the the patient's bedside AND on the patient's hospital floor or unit, greater than 50% of which was counseling/coordinating care for L1 kyphoplasty    Patient ID: 07/16/2019, female   DOB: Feb 13, 1941, 79 y.o.   MRN: [id number]

## 2019-07-14 NOTE — Progress Notes (Signed)
Inpatient Rehabilitation-Admissions Coordinator   Spoke with pt via phone regarding recommended rehab program. We reviewed CIR program details, expectations, anticipated LOS, and anticipated functional outcomes. Feel pt is a good candidate for CIR at this time given her prior level of function and prior activity level as well as her current functional progress with therapies. We did discuss that she will most likely still need intermittent supervision at DC and that she will need to confirm that prior to me beginning her insurance authorization process. She plans to speak with her family later today and I will call her in the morning to confirm DC plans. If support is confirmed, I will begin her insurance authorization process for possible admit to CIR. Pt is aware that her insurance must approve before we can admit her to CIR.   Please call if questions.   Cheri Rous, OTR/L  Rehab Admissions Coordinator  [phone] 07/14/2019 6:27 PM

## 2019-07-14 NOTE — Progress Notes (Signed)
PROGRESS NOTE    Jennifer [name]  KKX:[id number] DOB: December 04, 1940 DOA: 07/05/2019 PCP: Binnie Rail, MD    Brief Narrative:  79 y.o.femalewith medical history significant ofhypertension, depression, GERD, hyperlipidemia, peripheral vascular diseaserecently diagnosed AAA followed by vascular surgery Dr. Scot Dock presents to the ED, c/o poor appetite, severe low back pain unable to lay back. Pain is 10 out of 10 worse with any movement. She was sent to orthopedic surgery where she had CT done with subsequent referral back to vascular surgeon 3 days ago. She was referred back to orthopedic surgery by vascular surgery to continue evaluation but pain is unbearable so patient came to the ER. Pt admitted to the hospital for pain control. Denied any trauma. In the ED, X-ray of the lumbar region showed scoliosis and multilevel degenerative change no evidence of acute abnormalities. Patient being admitted mainly for pain management.    Today, patient denies any new complaints.  Awaiting CIR evaluation.  Denies any chest pain, abdominal pain, shortness of breath, nausea/vomiting, fever/chills.     Assessment & Plan:   Principal Problem:   Back pain Active Problems:   Dyslipidemia   Tobacco abuse   Essential hypertension   PAD (peripheral artery disease) (HCC)   Anxiety state   Abdominal aortic aneurysm (AAA) (HCC)  Severe back pain likely 2/2 acute/subacute compression fracture  MRI of lumbar spine showed L1 acute or subacute compression fracture with 25% height loss, diffuse degenerative disease with scoliosis, L4-5 moderate spinal stenosis, L5-S1 left foraminal impingement IR on board, s/p kyphoplasty on 07/11/19, deferred sacroplasty due to poor fluoro visualized anatomy (scoliosis, osteopenia). If symptoms persist, could perform under CT PT recommended CIR Pain management  Chronic hypokalemia Replace daily Daily BMP  Essential hypertension  Continue Norvasc  History of  infrarenal abdominal aortic aneurysm  5.4 cm followed by vascular surgery, Dr. Scot Dock Follow-up ultrasound in 6 months  Anxiety disorder  Continue Xanax  Hyperlipidemia Continue statin  Peripheral vascular disease Resume cilostazol  Tobacco abuse Pt advised to quit    Estimated body mass index is 15.96 kg/m as calculated from the following:   Height as of this encounter: 5\' 4"  (1.626 m).   Weight as of this encounter: 42.2 kg.  DVT prophylaxis: Lovenox Code Status:  Partial code, discussed with patient on 07/14/2019, does not want intubation, or put on life support Family Communication:  Discussed with patient extensively   Disposition Plan:  Patient came from home, likely discharge to CIR pending authorization/evaluation.  Stable to DC to CIR/SNF   Consultants:   IR  Procedures: None Antimicrobials: None    Objective: Vitals:   07/13/19 2024 07/14/19 0545 07/14/19 1345 07/14/19 1517  BP: 90/60 115/72 92/61   Pulse: 78 77 79   Resp: 19 18 20    Temp: 98.3 F (36.8 C) 97.8 F (36.6 C) 98.2 F (36.8 C)   TempSrc: Oral Oral Oral   SpO2: 96% 95% 97% 98%  Weight:      Height:        Intake/Output Summary (Last 24 hours) at 07/14/2019 1611 Last data filed at 07/14/2019 1696 Gross per 24 hour  Intake 712 ml  Output 450 ml  Net 262 ml   Filed Weights   07/05/19 1436  Weight: 42.2 kg    Examination:  General: NAD   Cardiovascular: S1, S2 present  Respiratory: CTAB  Abdomen: Soft, nontender, nondistended, bowel sounds present  Musculoskeletal: No bilateral pedal edema noted  Skin: Normal  Psychiatry: Normal mood  Data Reviewed: I have personally reviewed following labs and imaging studies  CBC: Recent Labs  Lab 07/09/19 0536 07/10/19 0556 07/11/19 0528 07/12/19 0547  WBC 7.1 7.0 7.6 9.2  NEUTROABS  --  4.8 5.0 6.7  HGB 9.6* 10.0* 10.4* 10.5*  HCT 30.2* 30.5* 32.5* 32.6*  MCV 84.4 85.0 83.5 84.0  PLT 291 293 349 375    Basic Metabolic Panel: Recent Labs  Lab 07/09/19 0536 07/10/19 0556 07/11/19 0528 07/12/19 0547  NA 134* 132* 133* 134*  K 2.6* 3.8 4.2 4.1  CL 99 100 98 98  CO2 26 24 26 24   GLUCOSE 88 92 99 87  BUN 7* 9 15 20   CREATININE 0.63 0.62 0.67 0.94  CALCIUM 7.7* 7.9* 8.5* 9.0   GFR: Estimated Creatinine Clearance: 31.8 mL/min (by C-G formula based on SCr of 0.94 mg/dL). Liver Function Tests: Recent Labs  Lab 07/09/19 0536  AST 27  ALT 14  ALKPHOS 81  BILITOT 0.6  PROT 6.2*  ALBUMIN 2.6*   No results for input(s): LIPASE, AMYLASE in the last 168 hours. No results for input(s): AMMONIA in the last 168 hours. Coagulation Profile: Recent Labs  Lab 07/10/19 0556  INR 1.1   Cardiac Enzymes: No results for input(s): CKTOTAL, CKMB, CKMBINDEX, TROPONINI in the last 168 hours. BNP (last 3 results) No results for input(s): PROBNP in the last 8760 hours. HbA1C: No results for input(s): HGBA1C in the last 72 hours. CBG: No results for input(s): GLUCAP in the last 168 hours. Lipid Profile: No results for input(s): CHOL, HDL, LDLCALC, TRIG, CHOLHDL, LDLDIRECT in the last 72 hours. Thyroid Function Tests: No results for input(s): TSH, T4TOTAL, FREET4, T3FREE, THYROIDAB in the last 72 hours. Anemia Panel: No results for input(s): VITAMINB12, FOLATE, FERRITIN, TIBC, IRON, RETICCTPCT in the last 72 hours. Sepsis Labs: No results for input(s): PROCALCITON, LATICACIDVEN in the last 168 hours.  Recent Results (from the past 240 hour(s))  SARS CORONAVIRUS 2 (TAT 6-24 HRS) Nasopharyngeal Nasopharyngeal Swab     Status: None   Collection Time: 07/05/19  6:06 PM   Specimen: Nasopharyngeal Swab  Result Value Ref Range Status   SARS Coronavirus 2 NEGATIVE NEGATIVE Final    Comment: (NOTE) SARS-CoV-2 target nucleic acids are NOT DETECTED. The SARS-CoV-2 RNA is generally detectable in upper and lower respiratory specimens during the acute phase of infection. Negative results do not  preclude SARS-CoV-2 infection, do not rule out co-infections with other pathogens, and should not be used as the sole basis for treatment or other patient management decisions. Negative results must be combined with clinical observations, patient history, and epidemiological information. The expected result is Negative. Fact Sheet for Patients: 07/12/19 Fact Sheet for Healthcare Providers: 07/07/19 This test is not yet approved or cleared by the [email or web address] FDA and  has been authorized for detection and/or diagnosis of SARS-CoV-2 by FDA under an Emergency Use Authorization (EUA). This EUA will remain  in effect (meaning this test can be used) for the duration of the COVID-19 declaration under Section 56 4(b)(1) of the Act, 21 U.S.C. section 360bbb-3(b)(1), unless the authorization is terminated or revoked sooner. Performed at [facility], [address] [place], [address] [place]          Radiology Studies: No results found.      Scheduled Meds: . amLODipine  5 mg Oral Daily  . aspirin EC  81 mg Oral QAC breakfast  . atorvastatin  20 mg Oral q1800  . bisacodyl  10 mg Oral Daily  . calcium-vitamin D  2 tablet Oral Daily  . cilostazol  50 mg Oral BID  . docusate sodium  200 mg Oral Daily  . enoxaparin (LOVENOX) injection  30 mg Subcutaneous Q24H  . feeding supplement (ENSURE ENLIVE)  237 mL Oral BID BM  . gabapentin  100 mg Oral QHS  . lidocaine  1 patch Transdermal Q24H  . pantoprazole  40 mg Oral Daily  . potassium chloride SA  20 mEq Oral Daily   Continuous Infusions:    LOS: 9 days     Briant Cedar, MD  07/14/2019, 4:11 PM

## 2019-07-14 NOTE — Progress Notes (Signed)
Occupational Therapy Treatment Patient Details Name: Jennifer [name] MRN: [id number] DOB: June 23, 1940 Today's Date: 07/14/2019    History of present illness 79 year old female admitted in increasing back pain, no known injury. PMH includes HTN, GERD,hyperlipidemia, PVD. Now s/p L1 kyphoplasty 07/11/2019   OT comments  Patient motivated to work with therapy. Pt min guard for bed mobility for safety, min A for functional transfers and ambulation to/from bathroom due to unsteadiness with min cues for body mechanics. Patient transfer to commode over toilet with min A and min A for managing clothing due to limited standing balance. Patient attempt sink side grooming/hygiene however due to pain pt standing tolerance limited and had to complete in sitting. Patient transfer to recliner at end of session with min A. Will continue to follow.   Follow Up Recommendations  CIR    Equipment Recommendations  Other (comment)(defer to next venue)       Precautions / Restrictions Precautions Precautions: Fall Precaution Comments: She is at risk for falls, and still having break through pain - radicular in nature down both legs Restrictions Weight Bearing Restrictions: No       Mobility Bed Mobility Overal bed mobility: Needs Assistance Bed Mobility: Rolling;Sidelying to Sit Rolling: Min guard Sidelying to sit: Min guard       General bed mobility comments: min guard for safety  Transfers Overall transfer level: Needs assistance Equipment used: Rolling walker (2 wheeled) Transfers: Sit to/from Stand Sit to Stand: Min assist         General transfer comment: verbal cues for body mechanics for eccentric control when sitting onto edge of bed    Balance Overall balance assessment: Needs assistance Sitting-balance support: No upper extremity supported;Feet supported Sitting balance-Leahy Scale: Good     Standing balance support: Bilateral upper extremity supported;During functional  activity Standing balance-Leahy Scale: Poor Standing balance comment: reliant on B UE support                           ADL either performed or assessed with clinical judgement   ADL Overall ADL's : Needs assistance/impaired     Grooming: Wash/dry face;Wash/dry hands;Set up;Sitting Grooming Details (indicate cue type and reason): attempted at sink side however due to pain pt with decreased standing tolerance and completed task in sitting                 Toilet Transfer: Minimal assistance;Cueing for safety;Ambulation;RW(commode over toilet) Toilet Transfer Details (indicate cue type and reason): min cues for body mechanics Toileting- Clothing Manipulation and Hygiene: Minimal assistance;Sit to/from stand Toileting - Clothing Manipulation Details (indicate cue type and reason): min cue to pull down underwear prior to sitting     Functional mobility during ADLs: Minimal assistance;Rolling walker;Cueing for safety General ADL Comments: patient requires increased assistance with self care due to pain, decreased activity tolerance, limited balance               Cognition Arousal/Alertness: Awake/alert Behavior During Therapy: WFL for tasks assessed/performed;Anxious Overall Cognitive Status: Within Functional Limits for tasks assessed                                                     Pertinent Vitals/ Pain       Pain Assessment: Faces Faces Pain Scale: Hurts even  more Pain Location: low back and RLE mobilizing in the room Pain Descriptors / Indicators: Aching;Discomfort;Grimacing;Shooting Pain Intervention(s): Monitored during session         Frequency  Min 2X/week        Progress Toward Goals  OT Goals(current goals can now be found in the care plan section)  Progress towards OT goals: Progressing toward goals  Acute Rehab OT Goals Patient Stated Goal: To get well, and be painfree- get back to PLOF OT Goal Formulation:  With patient Time For Goal Achievement: 07/26/19 Potential to Achieve Goals: Good ADL Goals Pt Will Perform Lower Body Bathing: with modified independence;sit to/from stand Pt Will Perform Lower Body Dressing: with modified independence;sit to/from stand Pt Will Transfer to Toilet: with modified independence;ambulating Pt Will Perform Toileting - Clothing Manipulation and hygiene: with modified independence;sit to/from stand Pt Will Perform Tub/Shower Transfer: with modified independence;ambulating;shower seat;rolling walker Additional ADL Goal #1: Pt will complete bed mobility at mod I level to prepare for OOB ADLs.  Plan Discharge plan remains appropriate       AM-PAC OT "6 Clicks" Daily Activity     Outcome Measure   Help from another person eating meals?: None Help from another person taking care of personal grooming?: A Little Help from another person toileting, which includes using toliet, bedpan, or urinal?: A Little Help from another person bathing (including washing, rinsing, drying)?: A Little Help from another person to put on and taking off regular upper body clothing?: A Little Help from another person to put on and taking off regular lower body clothing?: A Little 6 Click Score: 19    End of Session Equipment Utilized During Treatment: Rolling walker  OT Visit Diagnosis: Unsteadiness on feet (R26.81);Pain Pain - part of body: (back)   Activity Tolerance Patient limited by pain;Patient tolerated treatment well   Patient Left in chair;with call bell/phone within reach;with chair alarm set   Nurse Communication Patient requests pain meds        Time: 2542-7062 OT Time Calculation (min): 17 min  Charges: OT General Charges $OT Visit: 1 Visit OT Treatments $Self Care/Home Management : 8-22 mins  Myrtie Neither OT OT office: [phone]   Carmelia Roller 07/14/2019, 1:12 PM

## 2019-07-15 MED ORDER — ALUM & MAG HYDROXIDE-SIMETH 200-200-20 MG/5ML PO SUSP
15.0000 mL | ORAL | Status: DC | PRN
  Administered 2019-07-15 – 2019-07-17 (×3): 15 mL via ORAL
  Filled 2019-07-15 (×3): qty 30

## 2019-07-15 NOTE — Progress Notes (Signed)
Inpatient Rehabilitation-Admissions Coordinator   Confirmed DC plans with pt this AM. AC will begin insurance authorization process for possible admit.   Cheri Rous, OTR/L  Rehab Admissions Coordinator  [phone] 07/15/2019 8:44 AM

## 2019-07-15 NOTE — Progress Notes (Signed)
PROGRESS NOTE    Jennifer [name]  YQM:[id number] DOB: April 14, 1941 DOA: 07/05/2019 PCP: Pincus Sanes, MD    Brief Narrative:  79 y.o.femalewith medical history significant ofhypertension, depression, GERD, hyperlipidemia, peripheral vascular diseaserecently diagnosed AAA followed by vascular surgery Dr. Edilia Bo presents to the ED, c/o poor appetite, severe low back pain unable to lay back. Pain is 10 out of 10 worse with any movement. She was sent to orthopedic surgery where she had CT done with subsequent referral back to vascular surgeon 3 days ago. She was referred back to orthopedic surgery by vascular surgery to continue evaluation but pain is unbearable so patient came to the ER. Pt admitted to the hospital for pain control. Denied any trauma. In the ED, X-ray of the lumbar region showed scoliosis and multilevel degenerative change no evidence of acute abnormalities. Patient being admitted mainly for pain management.    Today, patient denies any new complaints.    Assessment & Plan:   Principal Problem:   Back pain Active Problems:   Dyslipidemia   Tobacco abuse   Essential hypertension   PAD (peripheral artery disease) (HCC)   Anxiety state   Abdominal aortic aneurysm (AAA) (HCC)  Severe back pain likely 2/2 acute/subacute compression fracture  MRI of lumbar spine showed L1 acute or subacute compression fracture with 25% height loss, diffuse degenerative disease with scoliosis, L4-5 moderate spinal stenosis, L5-S1 left foraminal impingement IR on board, s/p kyphoplasty on 07/11/19, deferred sacroplasty due to poor fluoro visualized anatomy (scoliosis, osteopenia). If symptoms persist, could perform under CT guidance PT recommended CIR Pain management  Chronic hypokalemia Replace daily Daily BMP  Essential hypertension  Continue Norvasc  History of infrarenal abdominal aortic aneurysm  5.4 cm followed by vascular surgery, Dr. Edilia Bo Follow-up ultrasound in 6  months  Anxiety disorder  Continue Xanax  Hyperlipidemia Continue statin  Peripheral vascular disease Resume cilostazol  Tobacco abuse Pt advised to quit    Estimated body mass index is 15.96 kg/m as calculated from the following:   Height as of this encounter: 5\' 4"  (1.626 m).   Weight as of this encounter: 42.2 kg.  DVT prophylaxis: Lovenox Code Status:  Partial code, discussed with patient on 07/14/2019, does not want intubation, or put on life support Family Communication:  Discussed with patient extensively   Disposition Plan:  Patient came from home, likely discharge to CIR pending authorization/evaluation.  Stable to DC to CIR/SNF   Consultants:   IR  Procedures: None Antimicrobials: None    Objective: Vitals:   07/14/19 1517 07/14/19 2038 07/15/19 0544 07/15/19 1159  BP:  116/64 129/78 129/73  Pulse:  76 79 70  Resp:  19 18 20   Temp:  98.4 F (36.9 C) 97.9 F (36.6 C) 98.3 F (36.8 C)  TempSrc:  Oral Oral Oral  SpO2: 98% 91% 95% 98%  Weight:      Height:        Intake/Output Summary (Last 24 hours) at 07/15/2019 1709 Last data filed at 07/15/2019 0545 Gross per 24 hour  Intake --  Output 400 ml  Net -400 ml   Filed Weights   07/05/19 1436  Weight: 42.2 kg    Examination:  General: NAD   Cardiovascular: S1, S2 present  Respiratory: CTAB  Abdomen: Soft, nontender, nondistended, bowel sounds present  Musculoskeletal: No bilateral pedal edema noted  Skin: Normal  Psychiatry: Normal mood    Data Reviewed: I have personally reviewed following labs and imaging studies  CBC: Recent Labs  Lab 07/09/19 0536 07/10/19 0556 07/11/19 0528 07/12/19 0547  WBC 7.1 7.0 7.6 9.2  NEUTROABS  --  4.8 5.0 6.7  HGB 9.6* 10.0* 10.4* 10.5*  HCT 30.2* 30.5* 32.5* 32.6*  MCV 84.4 85.0 83.5 84.0  PLT 291 293 349 761   Basic Metabolic Panel: Recent Labs  Lab 07/09/19 0536 07/10/19 0556 07/11/19 0528 07/12/19 0547  NA 134* 132*  133* 134*  K 2.6* 3.8 4.2 4.1  CL 99 100 98 98  CO2 26 24 26 24   GLUCOSE 88 92 99 87  BUN 7* 9 15 20   CREATININE 0.63 0.62 0.67 0.94  CALCIUM 7.7* 7.9* 8.5* 9.0   GFR: Estimated Creatinine Clearance: 31.8 mL/min (by C-G formula based on SCr of 0.94 mg/dL). Liver Function Tests: Recent Labs  Lab 07/09/19 0536  AST 27  ALT 14  ALKPHOS 81  BILITOT 0.6  PROT 6.2*  ALBUMIN 2.6*   No results for input(s): LIPASE, AMYLASE in the last 168 hours. No results for input(s): AMMONIA in the last 168 hours. Coagulation Profile: Recent Labs  Lab 07/10/19 0556  INR 1.1   Cardiac Enzymes: No results for input(s): CKTOTAL, CKMB, CKMBINDEX, TROPONINI in the last 168 hours. BNP (last 3 results) No results for input(s): PROBNP in the last 8760 hours. HbA1C: No results for input(s): HGBA1C in the last 72 hours. CBG: No results for input(s): GLUCAP in the last 168 hours. Lipid Profile: No results for input(s): CHOL, HDL, LDLCALC, TRIG, CHOLHDL, LDLDIRECT in the last 72 hours. Thyroid Function Tests: No results for input(s): TSH, T4TOTAL, FREET4, T3FREE, THYROIDAB in the last 72 hours. Anemia Panel: No results for input(s): VITAMINB12, FOLATE, FERRITIN, TIBC, IRON, RETICCTPCT in the last 72 hours. Sepsis Labs: No results for input(s): PROCALCITON, LATICACIDVEN in the last 168 hours.  Recent Results (from the past 240 hour(s))  SARS CORONAVIRUS 2 (TAT 6-24 HRS) Nasopharyngeal Nasopharyngeal Swab     Status: None   Collection Time: 07/05/19  6:06 PM   Specimen: Nasopharyngeal Swab  Result Value Ref Range Status   SARS Coronavirus 2 NEGATIVE NEGATIVE Final    Comment: (NOTE) SARS-CoV-2 target nucleic acids are NOT DETECTED. The SARS-CoV-2 RNA is generally detectable in upper and lower respiratory specimens during the acute phase of infection. Negative results do not preclude SARS-CoV-2 infection, do not rule out co-infections with other pathogens, and should not be used as the sole  basis for treatment or other patient management decisions. Negative results must be combined with clinical observations, patient history, and epidemiological information. The expected result is Negative. Fact Sheet for Patients: SugarRoll.be Fact Sheet for Healthcare Providers: https://www.woods-mathews.com/ This test is not yet approved or cleared by the [place] FDA and  has been authorized for detection and/or diagnosis of SARS-CoV-2 by FDA under an Emergency Use Authorization (EUA). This EUA will remain  in effect (meaning this test can be used) for the duration of the COVID-19 declaration under Section 56 4(b)(1) of the Act, 21 U.S.C. section 360bbb-3(b)(1), unless the authorization is terminated or revoked sooner. Performed at [facility], [place] [address] [place], [place] [address]          Radiology Studies: No results found.      Scheduled Meds: . amLODipine  5 mg Oral Daily  . aspirin EC  81 mg Oral QAC breakfast  . atorvastatin  20 mg Oral q1800  . bisacodyl  10 mg Oral Daily  . calcium-vitamin D  2 tablet Oral Daily  . cilostazol  50 mg Oral BID  . docusate sodium  200 mg Oral Daily  . enoxaparin (LOVENOX) injection  30 mg Subcutaneous Q24H  . feeding supplement (ENSURE ENLIVE)  237 mL Oral BID BM  . gabapentin  100 mg Oral QHS  . lidocaine  1 patch Transdermal Q24H  . pantoprazole  40 mg Oral Daily  . potassium chloride SA  20 mEq Oral Daily   Continuous Infusions:    LOS: 10 days     Briant Cedar, MD  07/15/2019, 5:09 PM

## 2019-07-15 NOTE — Progress Notes (Signed)
Physical Therapy Treatment Patient Details Name: Jennifer [name] MRN: [id number] DOB: 07-25-1940 Today's Date: 07/15/2019    History of Present Illness 79 year old female admitted in increasing back pain, no known injury. PMH includes HTN, GERD,hyperlipidemia, PVD. Now s/p L1 kyphoplasty 07/11/2019    PT Comments    Pt educated on back precautions for comfort, also since s/p kyphoplasty.  Pt requiring increased cues to maintain precautions with bed mobility.  Pt also reaching over and twisting for objects so educated to keep all belongings on tray table if possible and left pt with all needed items within easy reach.  Pt ambulated in hallway and performed a few exercises.  Pt eagerly anticipating d/c to CIR (pending insurance approval).  Pt very pleasant, cooperative and motivated.   Follow Up Recommendations  CIR     Equipment Recommendations  Rolling walker with 5" wheels    Recommendations for Other Services       Precautions / Restrictions Precautions Precautions: Fall;Back Precaution Comments: back for comfort s/p KP    Mobility  Bed Mobility   Bed Mobility: Rolling;Sidelying to Sit Rolling: Min guard Sidelying to sit: Min guard       General bed mobility comments: verbal cues for pt to use log roll technique however pt tends to perform her own way despite cues; explained technique again for returning to bed later with nursing when pt in recliner  Transfers Overall transfer level: Needs assistance Equipment used: Rolling walker (2 wheeled) Transfers: Sit to/from Stand Sit to Stand: Min assist         General transfer comment: slight assist to rise, cues for using lower extremities to assist (not bending over at trunk)  Ambulation/Gait Ambulation/Gait assistance: Min assist Gait Distance (Feet): 80 Feet Assistive device: Rolling walker (2 wheeled) Gait Pattern/deviations: Step-through pattern;Decreased stride length;Trunk flexed     General Gait Details:  verbal cues for RW positioning and posture as pt tends to move RW too far forward; pt reports pain improved since yesterday   Stairs             Wheelchair Mobility    Modified Rankin (Stroke Patients Only)       Balance                                            Cognition Arousal/Alertness: Awake/alert Behavior During Therapy: WFL for tasks assessed/performed Overall Cognitive Status: Within Functional Limits for tasks assessed                                        Exercises General Exercises - Lower Extremity Ankle Circles/Pumps: AROM;10 reps;Both Quad Sets: AROM;10 reps;Both Short Arc Quad: AROM;10 reps;Both Heel Slides: AROM;10 reps;AAROM;Both;Limitations Heel Slides Limitations: AAROM on Left for pain control Hip ABduction/ADduction: AROM;Both;10 reps;Limitations Hip Abduction/Adduction Limitations: AAROM on Left for pain control    General Comments        Pertinent Vitals/Pain Pain Assessment: Faces Faces Pain Scale: Hurts little more Pain Location: left low back Pain Descriptors / Indicators: Pressure;Discomfort Pain Intervention(s): Repositioned;Monitored during session    Home Living                      Prior Function  PT Goals (current goals can now be found in the care plan section) Progress towards PT goals: Progressing toward goals    Frequency    Min 3X/week      PT Plan Current plan remains appropriate    Co-evaluation              AM-PAC PT "6 Clicks" Mobility   Outcome Measure  Help needed turning from your back to your side while in a flat bed without using bedrails?: A Little Help needed moving from lying on your back to sitting on the side of a flat bed without using bedrails?: A Little Help needed moving to and from a bed to a chair (including a wheelchair)?: A Little Help needed standing up from a chair using your arms (e.g., wheelchair or bedside chair)?: A  Little Help needed to walk in hospital room?: A Little Help needed climbing 3-5 steps with a railing? : A Lot 6 Click Score: 17    End of Session   Activity Tolerance: Patient tolerated treatment well Patient left: in chair;with call bell/phone within reach;with chair alarm set   PT Visit Diagnosis: Muscle weakness (generalized) (M62.81);Other abnormalities of gait and mobility (R26.89)     Time: 2423-5361 PT Time Calculation (min) (ACUTE ONLY): 20 min  Charges:  $Gait Training: 8-22 mins                     Arlyce Dice, DPT Acute Rehabilitation Services Office: [phone]  York Ram E 07/15/2019, 3:20 PM

## 2019-07-16 LAB — BASIC METABOLIC PANEL
Anion gap: 8 (ref 5–15)
BUN: 18 mg/dL (ref 8–23)
CO2: 27 mmol/L (ref 22–32)
Calcium: 8.9 mg/dL (ref 8.9–10.3)
Chloride: 99 mmol/L (ref 98–111)
Creatinine, Ser: 0.79 mg/dL (ref 0.44–1.00)
GFR calc Af Amer: >60 mL/min (ref >60)
GFR calc non Af Amer: >60 mL/min (ref >60)
Glucose, Bld: 92 mg/dL (ref 70–99)
Potassium: 4.7 mmol/L (ref 3.5–5.1)
Sodium: 134 mmol/L — ABNORMAL LOW (ref 135–145)

## 2019-07-16 LAB — CBC
HCT: 31.8 % — ABNORMAL LOW (ref 36.0–46.0)
Hemoglobin: 9.9 g/dL — ABNORMAL LOW (ref 12.0–15.0)
MCH: 26.8 pg (ref 26.0–34.0)
MCHC: 31.1 g/dL (ref 30.0–36.0)
MCV: 85.9 fL (ref 80.0–100.0)
Platelets: 403 10*3/uL — ABNORMAL HIGH (ref 150–400)
RBC: 3.7 MIL/uL — ABNORMAL LOW (ref 3.87–5.11)
RDW: 15.3 % (ref 11.5–15.5)
WBC: 7.1 10*3/uL (ref 4.0–10.5)
nRBC: 0 % (ref 0.0–0.2)

## 2019-07-16 NOTE — Progress Notes (Signed)
PROGRESS NOTE    JEREMY MCLAMB  KGM:[id number] DOB: August 19, 1940 DOA: 07/05/2019 PCP: Pincus Sanes, MD   Brief Narrative:79 y.o.femalewith medical history significant ofhypertension, depression, GERD, hyperlipidemia, peripheral vascular diseaserecently diagnosed AAA followed by vascular surgery Dr. Edilia Bo presents to the ED, c/o poor appetite, severe low back pain unable to lay back. Pain is 10 out of 10 worse with any movement. She was sent to orthopedic surgery where she had CT done with subsequent referral back to vascular surgeon 3 days ago. She was referred back to orthopedic surgery by vascular surgery to continue evaluation but pain is unbearable so patient came to the ER. Pt admitted to the hospital for pain control. Denied any trauma. In the ED, X-ray of the lumbar region showed scoliosis and multilevel degenerative change no evidence of acute abnormalities. Patient being admitted mainly for pain management.  Assessment & Plan:   Principal Problem:   Back pain Active Problems:   Dyslipidemia   Tobacco abuse   Essential hypertension   PAD (peripheral artery disease) (HCC)   Anxiety state   Abdominal aortic aneurysm (AAA) (HCC)  Severe back pain likely 2/2 acute/subacute compression fracture  MRI of lumbar spine showed L1 acute or subacute compression fracture with 25% height loss, diffuse degenerative disease with scoliosis, L4-5 moderate spinal stenosis, L5-S1 left foraminal impingement IR on board, s/p kyphoplasty on 07/11/19, deferred sacroplasty due to poor fluoro visualized anatomy (scoliosis, osteopenia). If symptoms persist, could perform under CT guidance PT recommended CIR; CIR insurance authorization pending. Continue current pain management.  Chronic hypokalemia resolved potassium 4.7.  Essential hypertension -her blood pressure is too soft 96/66 I will stop her Norvasc and monitor closely.  History of infrarenal abdominal aortic aneurysm  5.4 cm  followed by vascular surgery, Dr. Edilia Bo Follow-up ultrasound in 6 months  Anxiety disorder  Continue Xanax  Hyperlipidemia Continue statin  Peripheral vascular disease Resume cilostazol  Tobacco abuse Pt advised to quit  Estimated body mass index is 15.96 kg/m as calculated from the following:   Height as of this encounter: 5\' 4"  (1.626 m).   Weight as of this encounter: 42.2 kg.  DVT prophylaxis: LOVENOX Code Status: Partial code Family Communication: DW SISTER Disposition Plan: Came from home Plan to discharge to CIR if insurance authorization goes through Barriers to discharge none  Consultants:  IR  Procedures:NONE Antimicrobials: NONE  Subjective: Resting in bed in no acute distress anxious to start rehab  Objective: Vitals:   07/16/19 0342 07/16/19 1019 07/16/19 1019 07/16/19 1357  BP: 116/80 119/79 119/79 96/66  Pulse: 68  81 94  Resp: 16   20  Temp: 97.7 F (36.5 C)   98 F (36.7 C)  TempSrc: Oral     SpO2: 100%  98% 97%  Weight:      Height:        Intake/Output Summary (Last 24 hours) at 07/16/2019 1436 Last data filed at 07/16/2019 0500 Gross per 24 hour  Intake --  Output 850 ml  Net -850 ml   Filed Weights   07/05/19 1436  Weight: 42.2 kg    Examination:  General exam: Appears calm and comfortable  Respiratory system: Clear to auscultation. Respiratory effort normal. Cardiovascular system: S1 & S2 heard, RRR. No JVD, murmurs, rubs, gallops or clicks. No pedal edema. Gastrointestinal system: Abdomen is nondistended, soft and nontender. No organomegaly or masses felt. Normal bowel sounds heard. Central nervous system: Alert and oriented. No focal neurological deficits. Extremities: Symmetric 5 x 5 power.  Skin: No rashes, lesions or ulcers Psychiatry: Judgement and insight appear normal. Mood & affect appropriate.     Data Reviewed: I have personally reviewed following labs and imaging studies  CBC: Recent Labs  Lab  07/10/19 0556 07/11/19 0528 07/12/19 0547 07/16/19 0538  WBC 7.0 7.6 9.2 7.1  NEUTROABS 4.8 5.0 6.7  --   HGB 10.0* 10.4* 10.5* 9.9*  HCT 30.5* 32.5* 32.6* 31.8*  MCV 85.0 83.5 84.0 85.9  PLT 293 349 375 564*   Basic Metabolic Panel: Recent Labs  Lab 07/10/19 0556 07/11/19 0528 07/12/19 0547 07/16/19 0538  NA 132* 133* 134* 134*  K 3.8 4.2 4.1 4.7  CL 100 98 98 99  CO2 24 26 24 27   GLUCOSE 92 99 87 92  BUN 9 15 20 18   CREATININE 0.62 0.67 0.94 0.79  CALCIUM 7.9* 8.5* 9.0 8.9   GFR: Estimated Creatinine Clearance: 37.4 mL/min (by C-G formula based on SCr of 0.79 mg/dL). Liver Function Tests: No results for input(s): AST, ALT, ALKPHOS, BILITOT, PROT, ALBUMIN in the last 168 hours. No results for input(s): LIPASE, AMYLASE in the last 168 hours. No results for input(s): AMMONIA in the last 168 hours. Coagulation Profile: Recent Labs  Lab 07/10/19 0556  INR 1.1   Cardiac Enzymes: No results for input(s): CKTOTAL, CKMB, CKMBINDEX, TROPONINI in the last 168 hours. BNP (last 3 results) No results for input(s): PROBNP in the last 8760 hours. HbA1C: No results for input(s): HGBA1C in the last 72 hours. CBG: No results for input(s): GLUCAP in the last 168 hours. Lipid Profile: No results for input(s): CHOL, HDL, LDLCALC, TRIG, CHOLHDL, LDLDIRECT in the last 72 hours. Thyroid Function Tests: No results for input(s): TSH, T4TOTAL, FREET4, T3FREE, THYROIDAB in the last 72 hours. Anemia Panel: No results for input(s): VITAMINB12, FOLATE, FERRITIN, TIBC, IRON, RETICCTPCT in the last 72 hours. Sepsis Labs: No results for input(s): PROCALCITON, LATICACIDVEN in the last 168 hours.  No results found for this or any previous visit (from the past 240 hour(s)).       Radiology Studies: No results found.      Scheduled Meds: . amLODipine  5 mg Oral Daily  . aspirin EC  81 mg Oral QAC breakfast  . atorvastatin  20 mg Oral q1800  . bisacodyl  10 mg Oral Daily  .  calcium-vitamin D  2 tablet Oral Daily  . cilostazol  50 mg Oral BID  . docusate sodium  200 mg Oral Daily  . enoxaparin (LOVENOX) injection  30 mg Subcutaneous Q24H  . feeding supplement (ENSURE ENLIVE)  237 mL Oral BID BM  . gabapentin  100 mg Oral QHS  . lidocaine  1 patch Transdermal Q24H  . pantoprazole  40 mg Oral Daily  . potassium chloride SA  20 mEq Oral Daily   Continuous Infusions:   LOS: 11 days       Georgette Shell, MD  07/16/2019, 2:36 PM

## 2019-07-16 NOTE — TOC Progression Note (Signed)
Transition of Care [facility]) - Progression Note    Patient Details  Name: Jennifer [name] MRN: [id number] Date of Birth: 07/02/1940  Transition of Care [facility]) CM/SW Contact  Ida Rogue, [place] Phone Number: 07/16/2019, 11:43 AM  Clinical Narrative:  Based on recommendation of Quality Collaborative Meeting, have sent out patient's information to local SNFs as back up plan to CIR as we wait for authorization. TOC will continue to follow during the course of hospitalization.      Expected Discharge Plan: IP Rehab Facility(vs. SNF) Barriers to Discharge: Other (comment)(Waiting for CIR to make determination)  Expected Discharge Plan and Services Expected Discharge Plan: IP Rehab Facility(vs. SNF) In-house Referral: Clinical Social Work     Living arrangements for the past 2 months: Single Family Home                                       Social Determinants of Health (SDOH) Interventions    Readmission Risk Interventions No flowsheet data found.

## 2019-07-16 NOTE — Progress Notes (Signed)
Inpatient Rehabilitation-Admissions Coordinator   I do not have a bed available for this patient today. Anticipate I will hear from her insurance company by tomorrow AM. Will follow up as soon as I hear from her insurance regarding a final determination.   Cheri Rous, OTR/L  Rehab Admissions Coordinator  [phone] 07/16/2019 12:04 PM

## 2019-07-16 NOTE — Progress Notes (Signed)
PT Cancellation Note  Patient Details Name: Jennifer [name] MRN: [id number] DOB: 06-Sep-1940   Cancelled Treatment:    Reason Eval/Treat Not Completed: Fatigue/lethargy limiting ability to participate;Pain limiting ability to participate(pt stated she just walked in the hallway then sat in recliner for 45 minutes, she is now fatigued and in pain and would like to rest. Will follow.)   Tamala Ser PT 07/16/2019  Acute Rehabilitation Services Pager [phone] Office [phone]

## 2019-07-16 NOTE — Progress Notes (Signed)
Inpatient Rehabilitation-Admissions Coordinator   Was notified by pt's insurance company for request for peer to peer conference with the attending physician. Dr. Ashley Royalty has agreed to complete. Peer to peer due by 11am on 3/25. Dr. Ashley Royalty plans to complete before end of day today. Will update once there has been a determination.   Cheri Rous, OTR/L  Rehab Admissions Coordinator  [phone] 07/16/2019 4:14 PM

## 2019-07-16 NOTE — NC FL2 (Signed)
[place] MEDICAID FL2 LEVEL OF CARE SCREENING TOOL     IDENTIFICATION  Patient Name: Jennifer [name] Birthdate: Feb 23, 1941 Sex: female Admission Date (Current Location): 07/05/2019  [facility] and [place] Number:  Herbalist and Address:  [facility],  [address], [place]      Provider Number: [id number]  Attending Physician Name and Address:  Georgette Shell, MD  Relative Name and Phone Number:  Philip Aspen [phone] Sister    Current Level of Care: Hospital Recommended Level of Care: [place] Prior Approval Number:    Date Approved/Denied:   PASRR Number: [id number]  Discharge Plan: SNF    Current Diagnoses: Patient Active Problem List   Diagnosis Date Noted  . Back pain 07/05/2019  . Lumbar radiculopathy 06/27/2019  . Lung nodule 06/10/2019  . Abdominal aortic aneurysm (AAA) ([place]) 06/10/2019  . Chest pain, musculoskeletal 06/10/2019  . Ascending aortic aneurysm ([place]) 06/10/2019  . Hiatal hernia, large 01/09/2018  . GERD (gastroesophageal reflux disease) 12/08/2016  . History of Clostridium difficile infection 11/29/2016  . Prediabetes 11/02/2016  . Carotid arterial disease ([place]) 10/20/2015  . Anxiety state   . PAD (peripheral artery disease) ([place]) 10/10/2011  . Osteoporosis, senile 05/17/2011  . Dyslipidemia 05/03/2009  . Tobacco abuse 04/14/2009  . Essential hypertension 04/14/2009    Orientation RESPIRATION BLADDER Height & Weight     Self, Time, Situation, Place  Normal External catheter Weight: 42.2 kg Height:  5\' 4"  (162.6 cm)  BEHAVIORAL SYMPTOMS/MOOD NEUROLOGICAL BOWEL NUTRITION STATUS  Other (Comment)(none) (none) Continent Diet(see discharge summary)  AMBULATORY STATUS COMMUNICATION OF NEEDS Skin   Extensive Assist Verbally Normal                       Personal Care Assistance Level of Assistance  Bathing, Feeding, Dressing Bathing Assistance: Maximum  assistance Feeding assistance: Independent Dressing Assistance: Maximum assistance     Functional Limitations Info  Sight, Hearing, Speech Sight Info: Adequate Hearing Info: Adequate Speech Info: Adequate    SPECIAL CARE FACTORS FREQUENCY  PT (By licensed PT), OT (By licensed OT)     PT Frequency: 5x/week OT Frequency: 5x/week            Contractures Contractures Info: Not present    Additional Factors Info  Allergies Code Status Info: Partial Allergies Info: NKA           Current Medications (07/16/2019):  This is the current hospital active medication list Current Facility-Administered Medications  Medication Dose Route Frequency Provider Last Rate Last Admin  . acetaminophen (TYLENOL) tablet 650 mg  650 mg Oral Q6H PRN Elwyn Reach, MD   650 mg at 07/05/19 2140   Or  . acetaminophen (TYLENOL) suppository 650 mg  650 mg Rectal Q6H PRN Elwyn Reach, MD      . ALPRAZolam Duanne Moron) tablet 0.25 mg  0.25 mg Oral QHS PRN Elwyn Reach, MD      . alum & mag hydroxide-simeth (MAALOX/MYLANTA) 200-200-20 MG/5ML suspension 15 mL  15 mL Oral Q4H PRN Alma Friendly, MD   15 mL at 07/15/19 1649  . amLODipine (NORVASC) tablet 5 mg  5 mg Oral Daily Georgette Shell, MD   5 mg at 07/16/19 1019  . aspirin EC tablet 81 mg  81 mg Oral QAC breakfast Gala Romney L, MD   81 mg at 07/16/19 1020  . atorvastatin (LIPITOR) tablet 20 mg  20 mg Oral q1800 Gala Romney  L, MD   20 mg at 07/15/19 1726  . bisacodyl (DULCOLAX) EC tablet 10 mg  10 mg Oral Daily Alwyn Ren, MD   10 mg at 07/16/19 1020  . calcium-vitamin D (OSCAL WITH D) 500-200 MG-UNIT per tablet 2 tablet  2 tablet Oral Daily Rometta Emery, MD   2 tablet at 07/16/19 1020  . cilostazol (PLETAL) tablet 50 mg  50 mg Oral BID Briant Cedar, MD   50 mg at 07/16/19 1020  . cyclobenzaprine (FLEXERIL) tablet 5 mg  5 mg Oral TID PRN Alwyn Ren, MD   5 mg at 07/12/19 2123  . docusate  sodium (COLACE) capsule 200 mg  200 mg Oral Daily Alwyn Ren, MD   200 mg at 07/16/19 1019  . enoxaparin (LOVENOX) injection 30 mg  30 mg Subcutaneous Q24H Briant Cedar, MD   30 mg at 07/15/19 2207  . feeding supplement (ENSURE ENLIVE) (ENSURE ENLIVE) liquid 237 mL  237 mL Oral BID BM Briant Cedar, MD   237 mL at 07/15/19 1430  . gabapentin (NEURONTIN) capsule 100 mg  100 mg Oral QHS Rometta Emery, MD   100 mg at 07/15/19 2207  . HYDROcodone-acetaminophen (NORCO) 7.5-325 MG per tablet 1 tablet  1 tablet Oral Q4H PRN Rometta Emery, MD   1 tablet at 07/15/19 2207  . iohexol (OMNIPAQUE) 300 MG/ML solution 50 mL  50 mL Other Once PRN Oley Balm, MD      . lidocaine (LIDODERM) 5 % 1 patch  1 patch Transdermal Q24H Alwyn Ren, MD   1 patch at 07/15/19 1726  . morphine 2 MG/ML injection 2 mg  2 mg Intravenous Q4H PRN Rometta Emery, MD   2 mg at 07/06/19 1033  . ondansetron (ZOFRAN) tablet 4 mg  4 mg Oral Q6H PRN Rometta Emery, MD       Or  . ondansetron (ZOFRAN) injection 4 mg  4 mg Intravenous Q6H PRN Rometta Emery, MD   4 mg at 07/15/19 2214  . pantoprazole (PROTONIX) EC tablet 40 mg  40 mg Oral Daily Earlie Lou L, MD   40 mg at 07/16/19 1020  . potassium chloride SA (KLOR-CON) CR tablet 20 mEq  20 mEq Oral Daily Briant Cedar, MD   20 mEq at 07/16/19 1019     Discharge Medications: Please see discharge summary for a list of discharge medications.  Relevant Imaging Results:  Relevant Lab Results:   Additional Information SSN [id number]  Ida Rogue, [place]

## 2019-07-16 NOTE — Progress Notes (Signed)
Occupational Therapy Treatment Patient Details Name: Jennifer [name] MRN: [id number] DOB: August 15, 1940 Today's Date: 07/16/2019    History of present illness 79 year old female admitted in increasing back pain, no known injury. PMH includes HTN, GERD,hyperlipidemia, PVD. Now s/p L1 kyphoplasty 07/11/2019   OT comments  Pt progressing towards acute OT goals. Eager to work with therapy. Focus of session was bed mobility, household distance functional mobility, and simulated toilet transfer. Pt up in recliner at end of session. Min A for LB ADLs and functional transfers, min guard to min A with mobility. D/c plan remains appropriate.   Follow Up Recommendations  CIR    Equipment Recommendations  Other (comment)(defer to next venue)    Recommendations for Other Services      Precautions / Restrictions Precautions Precautions: Fall;Back Precaution Comments: back for comfort s/p KP Restrictions Weight Bearing Restrictions: No       Mobility Bed Mobility Overal bed mobility: Needs Assistance Bed Mobility: Rolling;Sidelying to Sit Rolling: Min guard Sidelying to sit: Min guard       General bed mobility comments: difficulty with mechanics of log roll. tends to do her preferred way. min guard for safety  Transfers Overall transfer level: Needs assistance Equipment used: Rolling walker (2 wheeled) Transfers: Sit to/from Stand Sit to Stand: Min assist         General transfer comment: assist to steady and control descent. cues for technique with rw. to/from EOB and recliner.    Balance Overall balance assessment: Needs assistance Sitting-balance support: No upper extremity supported;Feet supported Sitting balance-Leahy Scale: Good     Standing balance support: Bilateral upper extremity supported Standing balance-Leahy Scale: Poor Standing balance comment: reliant on B UE support                           ADL either performed or assessed with clinical  judgement   ADL Overall ADL's : Needs assistance/impaired                         Toilet Transfer: Minimal assistance;Cueing for safety;Ambulation;RW Toilet Transfer Details (indicate cue type and reason): simulated with household distance functional mobility and transfer to recliner         Functional mobility during ADLs: Min guard;Minimal assistance;Rolling walker General ADL Comments: steadying assist and cues for technique with functional transfers and mobility. Pt completed bed mobility, household distance functional mobility, imulated toilet transfer. Up in recliner at end of session     Vision       Perception     Praxis      Cognition Arousal/Alertness: Awake/alert Behavior During Therapy: WFL for tasks assessed/performed Overall Cognitive Status: Within Functional Limits for tasks assessed                                          Exercises     Shoulder Instructions       General Comments      Pertinent Vitals/ Pain       Pain Assessment: Faces Faces Pain Scale: Hurts even more Pain Location: left low back Pain Descriptors / Indicators: Pressure;Discomfort Pain Intervention(s): Monitored during session;Limited activity within patient's tolerance;Repositioned;Other (comment)(notifed nursing)  Home Living  Prior Functioning/Environment              Frequency  Min 2X/week        Progress Toward Goals  OT Goals(current goals can now be found in the care plan section)  Progress towards OT goals: Progressing toward goals  Acute Rehab OT Goals Patient Stated Goal: To get well, and be painfree- get back to PLOF OT Goal Formulation: With patient Time For Goal Achievement: 07/26/19 Potential to Achieve Goals: Good ADL Goals Pt Will Perform Lower Body Bathing: with modified independence;sit to/from stand Pt Will Perform Lower Body Dressing: with modified  independence;sit to/from stand Pt Will Transfer to Toilet: with modified independence;ambulating Pt Will Perform Toileting - Clothing Manipulation and hygiene: with modified independence;sit to/from stand Pt Will Perform Tub/Shower Transfer: with modified independence;ambulating;shower seat;rolling walker Additional ADL Goal #1: Pt will complete bed mobility at mod I level to prepare for OOB ADLs.  Plan Discharge plan remains appropriate    Co-evaluation                 AM-PAC OT "6 Clicks" Daily Activity     Outcome Measure   Help from another person eating meals?: None Help from another person taking care of personal grooming?: A Little Help from another person toileting, which includes using toliet, bedpan, or urinal?: A Little Help from another person bathing (including washing, rinsing, drying)?: A Little Help from another person to put on and taking off regular upper body clothing?: A Little Help from another person to put on and taking off regular lower body clothing?: A Little 6 Click Score: 19    End of Session Equipment Utilized During Treatment: Rolling walker  OT Visit Diagnosis: Unsteadiness on feet (R26.81);Pain   Activity Tolerance Patient tolerated treatment well;Patient limited by pain   Patient Left in chair;with call bell/phone within reach;with chair alarm set   Nurse Communication Other (comment)(pain level, green pills on table, pt r/o heartburn)        Time: 6767-2094 OT Time Calculation (min): 15 min  Charges: OT General Charges $OT Visit: 1 Visit OT Treatments $Self Care/Home Management : 8-22 mins  Tyrone Schimke, OT Acute Rehabilitation Services Pager: [phone] Office: [phone]    Hortencia Pilar 07/16/2019, 1:43 PM

## 2019-07-16 NOTE — Progress Notes (Signed)
Inpatient Rehabilitation-Admissions Coordinator   Was notified by pt's insurance company that despite peer to peer conference with Dr. Ashley Royalty, the patient's request for CIR has been denied.   AC has notified the patient of her denial. She is open to SNF at this time. AC will contact TOC team in the morning to alert them to need for new dispo plans.   AC will sign off. Please call if questions.   Cheri Rous, OTR/L  Rehab Admissions Coordinator  [phone] 07/16/2019 6:28 PM

## 2019-07-17 LAB — SARS CORONAVIRUS 2 (TAT 6-24 HRS): SARS Coronavirus 2: NEGATIVE

## 2019-07-17 LAB — GLUCOSE, CAPILLARY: Glucose-Capillary: 98 mg/dL (ref 70–99)

## 2019-07-17 NOTE — Progress Notes (Addendum)
Physical Therapy Treatment Patient Details Name: Jennifer [name] MRN: [id number] DOB: 01-24-1941 Today's Date: 07/17/2019    History of Present Illness 79 year old female admitted in increasing back pain, no known injury. PMH includes HTN, GERD,hyperlipidemia, PVD. Now s/p L1 kyphoplasty 07/11/2019    PT Comments    Pt is progressing well with mobility. She ambulated 150' with RW, distanced limited by L back and LE pain. Noted plan is for ST-SNF, insurance declined CIR.   Follow Up Recommendations  ST-SNF; supervision for mobility     Equipment Recommendations  Rolling walker with 5" wheels    Recommendations for Other Services       Precautions / Restrictions Precautions Precautions: Fall;Back Precaution Comments: back for comfort s/p KP, reviewed log roll Restrictions Weight Bearing Restrictions: No    Mobility  Bed Mobility Overal bed mobility: Needs Assistance Bed Mobility: Rolling;Sidelying to Sit Rolling: Supervision Sidelying to sit: Supervision       General bed mobility comments: VCs technique for log roll  Transfers Overall transfer level: Needs assistance Equipment used: Rolling walker (2 wheeled) Transfers: Sit to/from Stand Sit to Stand: Min assist         General transfer comment: assist to steady and control descent. cues for technique with rw.  Ambulation/Gait Ambulation/Gait assistance: Supervision Gait Distance (Feet): 150 Feet Assistive device: Rolling walker (2 wheeled) Gait Pattern/deviations: Step-through pattern;Decreased stride length;Trunk flexed Gait velocity: WFL   General Gait Details: VCs posture and positioning in RW, kyphotic   Stairs             Wheelchair Mobility    Modified Rankin (Stroke Patients Only)       Balance Overall balance assessment: Needs assistance Sitting-balance support: No upper extremity supported;Feet supported Sitting balance-Leahy Scale: Good     Standing balance support: Bilateral  upper extremity supported Standing balance-Leahy Scale: Poor Standing balance comment: reliant on B UE support                            Cognition Arousal/Alertness: Awake/alert Behavior During Therapy: WFL for tasks assessed/performed Overall Cognitive Status: Within Functional Limits for tasks assessed                                        Exercises      General Comments        Pertinent Vitals/Pain Pain Score: 6  Pain Location: L low back and LLE Pain Descriptors / Indicators: Sore Pain Intervention(s): Limited activity within patient's tolerance;Monitored during session;Patient requesting pain meds-RN notified    Home Living                      Prior Function            PT Goals (current goals can now be found in the care plan section) Acute Rehab PT Goals Patient Stated Goal: To get well, and be painfree- get back to PLOF; loves to read PT Goal Formulation: With patient Time For Goal Achievement: 07/21/19 Progress towards PT goals: Progressing toward goals    Frequency    Min 3X/week      PT Plan Current plan remains appropriate    Co-evaluation              AM-PAC PT "6 Clicks" Mobility   Outcome Measure  Help needed turning from your  back to your side while in a flat bed without using bedrails?: A Little Help needed moving from lying on your back to sitting on the side of a flat bed without using bedrails?: A Little Help needed moving to and from a bed to a chair (including a wheelchair)?: A Little Help needed standing up from a chair using your arms (e.g., wheelchair or bedside chair)?: A Little Help needed to walk in hospital room?: A Little Help needed climbing 3-5 steps with a railing? : A Lot 6 Click Score: 17    End of Session Equipment Utilized During Treatment: Gait belt Activity Tolerance: Patient limited by pain Patient left: in chair;with call bell/phone within reach;with chair alarm  set Nurse Communication: Mobility status;Patient requests pain meds PT Visit Diagnosis: Muscle weakness (generalized) (M62.81);Other abnormalities of gait and mobility (R26.89)     Time: 1030-1050 PT Time Calculation (min) (ACUTE ONLY): 20 min  Charges:  $Gait Training: 8-22 mins                     Ralene Bathe Kistler PT 07/17/2019  Acute Rehabilitation Services Pager [phone] Office [phone]

## 2019-07-17 NOTE — Progress Notes (Signed)
PROGRESS NOTE    Jennifer [name]  NKN:[id number] DOB: [birth year] DOA: 07/05/2019 PCP: Pincus Sanes, MD   Brief Narrative: 79 y.o.femalewith medical history significant ofhypertension, depression, GERD, hyperlipidemia, peripheral vascular diseaserecently diagnosed AAA followed by vascular surgery Dr. Edilia Bo presents to the ED, c/o poor appetite, severe low back pain unable to lay back. Pain is 10 out of 10 worse with any movement. She was sent to orthopedic surgery where she had CT done with subsequent referral back to vascular surgeon 3 days ago. She was referred back to orthopedic surgery by vascular surgery to continue evaluation but pain is unbearable so patient came to the ER. Pt admitted to the hospital for pain control. Denied any trauma. In the ED, X-ray of the lumbar region showed scoliosis and multilevel degenerative change no evidence of acute abnormalities. Patient being admitted mainly for pain management.  Assessment & Plan:   Principal Problem:   Back pain Active Problems:   Dyslipidemia   Tobacco abuse   Essential hypertension   PAD (peripheral artery disease) ([facility])   Anxiety state   Abdominal aortic aneurysm (AAA) ([facility])   Severe back pain likely 2/2 acute/subacute compression fracture  MRI of lumbar spine showed L1 acute or subacute compression fracture with 25% height loss, diffuse degenerative disease with scoliosis, L4-5 moderate spinal stenosis, L5-S1 left foraminal impingement IR on board, s/p kyphoplasty on 07/11/19, deferred sacroplasty due to poor fluoro visualized anatomy (scoliosis, osteopenia). If symptoms persist, could perform under CTguidance PT recommended CIR; CIR insurance authorization pending. Continue current pain management.  Chronic hypokalemia resolved potassium 4.7.  Essential hypertension-her blood pressure is 96/65 continue to hold Norvasc.    History of infrarenal abdominal aortic aneurysm 5.4 cm followed by vascular surgery,  Dr. Edilia Bo Follow-up ultrasound in 6 months  Anxiety disorder  Continue Xanax  Hyperlipidemia Continue statin  Peripheral vascular disease Resume cilostazol  Tobacco abuse Pt advised to quit    Estimated body mass index is 15.96 kg/m as calculated from the following:   Height as of this encounter: 5\' 4"  (1.626 m).   Weight as of this encounter: 42.2 kg.  DVT prophylaxis: LOVENOX Code Status: Partial code Family Communication: DW SISTER Disposition Plan: Came from home Plan to discharge to rehab/SNF  Barriers to discharge none  Consultants:  IR  Procedures:NONE Antimicrobials: NONE  Subjective:  Pain better waiting to go to rehab still needs 1 person assistance to walk Objective: Vitals:   07/16/19 1357 07/16/19 2149 07/17/19 0503 07/17/19 1342  BP: 96/66 121/66 126/68 96/65  Pulse: 94 70 81 86  Resp: 20 18 18 18   Temp: 98 F (36.7 C) 98 F (36.7 C) 98.3 F (36.8 C) 98.2 F (36.8 C)  TempSrc: Oral Oral Oral Oral  SpO2: 97% 98% 94% 97%  Weight:      Height:        Intake/Output Summary (Last 24 hours) at 07/17/2019 1400 Last data filed at 07/17/2019 0651 Gross per 24 hour  Intake 472 ml  Output 1000 ml  Net -528 ml   Filed Weights   07/05/19 1436  Weight: 42.2 kg    Examination:  General exam: Appears in mild distress due to pain Respiratory system: Clear to auscultation. Respiratory effort normal. Cardiovascular system: S1 & S2 heard, RRR. No JVD, murmurs, rubs, gallops or clicks. No pedal edema. Gastrointestinal system: Abdomen is nondistended, soft and nontender. No organomegaly or masses felt. Normal bowel sounds heard. Central nervous system: Alert and oriented. No focal neurological deficits. Extremities:  Symmetric 5 x 5 power. Skin: No rashes, lesions or ulcers Psychiatry: Judgement and insight appear normal. Mood & affect appropriate.     Data Reviewed: I have personally reviewed following labs and imaging  studies  CBC: Recent Labs  Lab 07/11/19 0528 07/12/19 0547 07/16/19 0538  WBC 7.6 9.2 7.1  NEUTROABS 5.0 6.7  --   HGB 10.4* 10.5* 9.9*  HCT 32.5* 32.6* 31.8*  MCV 83.5 84.0 85.9  PLT 349 375 951*   Basic Metabolic Panel: Recent Labs  Lab 07/11/19 0528 07/12/19 0547 07/16/19 0538  NA 133* 134* 134*  K 4.2 4.1 4.7  CL 98 98 99  CO2 26 24 27   GLUCOSE 99 87 92  BUN 15 20 18   CREATININE 0.67 0.94 0.79  CALCIUM 8.5* 9.0 8.9   GFR: Estimated Creatinine Clearance: 37.4 mL/min (by C-G formula based on SCr of 0.79 mg/dL). Liver Function Tests: No results for input(s): AST, ALT, ALKPHOS, BILITOT, PROT, ALBUMIN in the last 168 hours. No results for input(s): LIPASE, AMYLASE in the last 168 hours. No results for input(s): AMMONIA in the last 168 hours. Coagulation Profile: No results for input(s): INR, PROTIME in the last 168 hours. Cardiac Enzymes: No results for input(s): CKTOTAL, CKMB, CKMBINDEX, TROPONINI in the last 168 hours. BNP (last 3 results) No results for input(s): PROBNP in the last 8760 hours. HbA1C: No results for input(s): HGBA1C in the last 72 hours. CBG: No results for input(s): GLUCAP in the last 168 hours. Lipid Profile: No results for input(s): CHOL, HDL, LDLCALC, TRIG, CHOLHDL, LDLDIRECT in the last 72 hours. Thyroid Function Tests: No results for input(s): TSH, T4TOTAL, FREET4, T3FREE, THYROIDAB in the last 72 hours. Anemia Panel: No results for input(s): VITAMINB12, FOLATE, FERRITIN, TIBC, IRON, RETICCTPCT in the last 72 hours. Sepsis Labs: No results for input(s): PROCALCITON, LATICACIDVEN in the last 168 hours.  No results found for this or any previous visit (from the past 240 hour(s)).       Radiology Studies: No results found.      Scheduled Meds: . aspirin EC  81 mg Oral QAC breakfast  . atorvastatin  20 mg Oral q1800  . bisacodyl  10 mg Oral Daily  . calcium-vitamin D  2 tablet Oral Daily  . cilostazol  50 mg Oral BID  .  docusate sodium  200 mg Oral Daily  . enoxaparin (LOVENOX) injection  30 mg Subcutaneous Q24H  . feeding supplement (ENSURE ENLIVE)  237 mL Oral BID BM  . gabapentin  100 mg Oral QHS  . lidocaine  1 patch Transdermal Q24H  . pantoprazole  40 mg Oral Daily  . potassium chloride SA  20 mEq Oral Daily   Continuous Infusions:   LOS: 12 days     Georgette Shell, MD 07/17/2019, 2:00 PM

## 2019-07-17 NOTE — TOC Progression Note (Addendum)
Transition of Care [facility]) - Progression Note    Patient Details  Name: Jennifer [name] MRN: [id number] Date of Birth: [birth year]  Transition of Care [facility]) CM/SW Contact  Ida Rogue, [place] Phone Number: 07/17/2019, 10:03 AM  Clinical Narrative:   Informed patient CIR was not authorized by insurance.  Yesterday, I had sent out a bed search as back-up plan.  7 bed offers made.  Patient selects [facility]. CSW to submit insurance authorization request.  TOC will continue to follow during the course of hospitalization.  Addendum:  Authorization received from [facility]. Auth. #[id number]  For 3 days starting today.  Review due 27th with Bjorn Pippin at [facility] # [phone].     Expected Discharge Plan: Skilled Nursing Facility Barriers to Discharge: (SNF pending insurance auth)  Expected Discharge Plan and Services Expected Discharge Plan: Skilled Nursing Facility In-house Referral: Clinical Social Work     Living arrangements for the past 2 months: Single Family Home                                       Social Determinants of Health (SDOH) Interventions    Readmission Risk Interventions No flowsheet data found.

## 2019-07-18 DIAGNOSIS — I739 Peripheral vascular disease, unspecified: Secondary | ICD-10-CM | POA: Diagnosis not present

## 2019-07-18 DIAGNOSIS — D649 Anemia, unspecified: Secondary | ICD-10-CM | POA: Diagnosis not present

## 2019-07-18 DIAGNOSIS — M1990 Unspecified osteoarthritis, unspecified site: Secondary | ICD-10-CM | POA: Diagnosis not present

## 2019-07-18 DIAGNOSIS — E559 Vitamin D deficiency, unspecified: Secondary | ICD-10-CM | POA: Diagnosis not present

## 2019-07-18 DIAGNOSIS — M549 Dorsalgia, unspecified: Secondary | ICD-10-CM | POA: Diagnosis not present

## 2019-07-18 DIAGNOSIS — J449 Chronic obstructive pulmonary disease, unspecified: Secondary | ICD-10-CM | POA: Diagnosis not present

## 2019-07-18 DIAGNOSIS — I714 Abdominal aortic aneurysm, without rupture: Secondary | ICD-10-CM | POA: Diagnosis not present

## 2019-07-18 DIAGNOSIS — K5900 Constipation, unspecified: Secondary | ICD-10-CM | POA: Diagnosis not present

## 2019-07-18 DIAGNOSIS — R5381 Other malaise: Secondary | ICD-10-CM | POA: Diagnosis not present

## 2019-07-18 DIAGNOSIS — S32010D Wedge compression fracture of first lumbar vertebra, subsequent encounter for fracture with routine healing: Secondary | ICD-10-CM | POA: Diagnosis not present

## 2019-07-18 DIAGNOSIS — M62838 Other muscle spasm: Secondary | ICD-10-CM | POA: Diagnosis not present

## 2019-07-18 DIAGNOSIS — I10 Essential (primary) hypertension: Secondary | ICD-10-CM | POA: Diagnosis not present

## 2019-07-18 DIAGNOSIS — M2550 Pain in unspecified joint: Secondary | ICD-10-CM | POA: Diagnosis not present

## 2019-07-18 DIAGNOSIS — Z720 Tobacco use: Secondary | ICD-10-CM | POA: Diagnosis not present

## 2019-07-18 DIAGNOSIS — S3219XD Other fracture of sacrum, subsequent encounter for fracture with routine healing: Secondary | ICD-10-CM | POA: Diagnosis not present

## 2019-07-18 DIAGNOSIS — M545 Low back pain: Secondary | ICD-10-CM | POA: Diagnosis not present

## 2019-07-18 DIAGNOSIS — Z4789 Encounter for other orthopedic aftercare: Secondary | ICD-10-CM | POA: Diagnosis not present

## 2019-07-18 DIAGNOSIS — M792 Neuralgia and neuritis, unspecified: Secondary | ICD-10-CM | POA: Diagnosis not present

## 2019-07-18 DIAGNOSIS — Z743 Need for continuous supervision: Secondary | ICD-10-CM | POA: Diagnosis not present

## 2019-07-18 DIAGNOSIS — E46 Unspecified protein-calorie malnutrition: Secondary | ICD-10-CM | POA: Diagnosis not present

## 2019-07-18 DIAGNOSIS — M810 Age-related osteoporosis without current pathological fracture: Secondary | ICD-10-CM | POA: Diagnosis not present

## 2019-07-18 DIAGNOSIS — E785 Hyperlipidemia, unspecified: Secondary | ICD-10-CM | POA: Diagnosis not present

## 2019-07-18 DIAGNOSIS — Z7401 Bed confinement status: Secondary | ICD-10-CM | POA: Diagnosis not present

## 2019-07-18 DIAGNOSIS — K219 Gastro-esophageal reflux disease without esophagitis: Secondary | ICD-10-CM | POA: Diagnosis not present

## 2019-07-18 DIAGNOSIS — F411 Generalized anxiety disorder: Secondary | ICD-10-CM | POA: Diagnosis not present

## 2019-07-18 MED ORDER — ONDANSETRON HCL 4 MG PO TABS: 4.0000 mg | ORAL_TABLET | Freq: Four times a day (QID) | ORAL | 0 refills | Status: DC | PRN

## 2019-07-18 MED ORDER — DOCUSATE SODIUM 100 MG PO CAPS: 200.0000 mg | ORAL_CAPSULE | Freq: Every day | ORAL | 0 refills | Status: DC

## 2019-07-18 MED ORDER — BISACODYL 10 MG RE SUPP
10.0000 mg | Freq: Once | RECTAL | Status: AC
  Administered 2019-07-18: 10 mg via RECTAL
  Filled 2019-07-18: qty 1

## 2019-07-18 MED ORDER — HYDROCODONE-ACETAMINOPHEN 7.5-325 MG PO TABS: 1.0000 | ORAL_TABLET | ORAL | 0 refills | Status: DC | PRN

## 2019-07-18 MED ORDER — LOPERAMIDE HCL 2 MG PO CAPS
4.0000 mg | ORAL_CAPSULE | Freq: Once | ORAL | Status: DC
  Filled 2019-07-18: qty 2

## 2019-07-18 MED ORDER — HYDROCORTISONE 1 % EX CREA: TOPICAL_CREAM | Freq: Two times a day (BID) | CUTANEOUS | 0 refills | Status: DC | PRN

## 2019-07-18 MED ORDER — ALUM & MAG HYDROXIDE-SIMETH 200-200-20 MG/5ML PO SUSP: 15.0000 mL | ORAL | 0 refills | Status: DC | PRN

## 2019-07-18 MED ORDER — ACETAMINOPHEN 325 MG PO TABS: 650.0000 mg | ORAL_TABLET | Freq: Four times a day (QID) | ORAL | Status: DC | PRN

## 2019-07-18 MED ORDER — LIDOCAINE 5 % EX PTCH: 1.0000 | MEDICATED_PATCH | CUTANEOUS | 0 refills | Status: DC

## 2019-07-18 MED ORDER — HYDROCORTISONE (PERIANAL) 2.5 % EX CREA
TOPICAL_CREAM | Freq: Two times a day (BID) | CUTANEOUS | Status: DC | PRN
  Filled 2019-07-18: qty 28.35

## 2019-07-18 MED ORDER — BISACODYL 5 MG PO TBEC: 10.0000 mg | DELAYED_RELEASE_TABLET | Freq: Every day | ORAL | 0 refills | Status: DC

## 2019-07-18 MED ORDER — CYCLOBENZAPRINE HCL 5 MG PO TABS: 5.0000 mg | ORAL_TABLET | Freq: Three times a day (TID) | ORAL | 0 refills | Status: DC | PRN

## 2019-07-18 MED ORDER — ALPRAZOLAM 0.25 MG PO TABS: 0.2500 mg | ORAL_TABLET | Freq: Every evening | ORAL | 0 refills | Status: DC | PRN

## 2019-07-18 NOTE — Discharge Summary (Signed)
Physician Discharge Summary  Jennifer [name] L [name] BJY:[id number] DOB: 11/02/1940 DOA: 07/05/2019  PCP: Jennifer [name], Jennifer J, MD  Admit date: 07/05/2019 Discharge date: 07/18/2019  Admitted From: Home Disposition: Skilled nursing facility  Recommendations for Outpatient Follow-up:  1. Follow up with PCP in 1-2 weeks 2. Please obtain BMP/CBC in one week   Home Health: None Equipment/Devices: None Discharge Condition: Stable and improved CODE STATUS: Partial  diet recommendation: Cardiac diet Brief/Interim Summary: 79 y.o.femalewith medical history significant ofhypertension, depression, GERD, hyperlipidemia, peripheral vascular diseaserecently diagnosed AAA followed by vascular surgery Dr. Edilia [name] presents to the ED, c/o poor appetite, severe low back pain unable to lay back. Pain is 10 out of 10 worse with any movement. She was sent to orthopedic surgery where she had CT done with subsequent referral back to vascular surgeon 3 days ago. She was referred back to orthopedic surgery by vascular surgery to continue evaluation but pain is unbearable so patient came to the ER. Pt admitted to the hospital for pain control. Denied any trauma. In the ED, X-ray of the lumbar region showed scoliosis and multilevel degenerative change no evidence of acute abnormalities. Patient being admitted mainly for pain management.   Discharge Diagnoses:  Principal Problem:   Back pain Active Problems:   Dyslipidemia   Tobacco abuse   Essential hypertension   PAD (peripheral artery disease) (HCC)   Anxiety state   Abdominal aortic aneurysm (AAA) (HCC)  Severe back pain likely 2/2 acute/subacute compression fracture  MRI of lumbar spine showed L1 acute or subacute compression fracture with 25% height loss, diffuse degenerative disease with scoliosis, L4-5 moderate spinal stenosis, L5-S1 left foraminal impingement IR on board, s/p kyphoplasty on 07/11/19, deferred sacroplasty due to poor fluoro visualized anatomy  (scoliosis, osteopenia). PT recommends SNF Covid - 07/17/2019 negative  Chronic hypokalemiaresolved potassium 4.7.  Essential hypertension-her blood pressure is 96/65 continue to hold Norvasc.    History of infrarenal abdominal aortic aneurysm 5.4 cm followed by vascular surgery, Dr. Edilia [name] Follow-up ultrasound in 6 months  Anxiety disorder  Continue Xanax  Hyperlipidemia Continue statin  Peripheral vascular disease Resume cilostazol  Tobacco abuse Pt advised to quit   Estimated body mass index is 15.96 kg/m as calculated from the following:   Height as of this encounter: 5\' 4"  (1.626 m).   Weight as of this encounter: 42.2 kg.  Discharge Instructions  Discharge Instructions    Call MD for:  persistant nausea and vomiting   Complete by: As directed    Call MD for:  severe uncontrolled pain   Complete by: As directed    Call MD for:  temperature >100.4   Complete by: As directed    Diet - low sodium heart healthy   Complete by: As directed    Increase activity slowly   Complete by: As directed      Allergies as of 07/18/2019   No Known Allergies     Medication List    STOP taking these medications   amLODipine 5 MG tablet Commonly known as: NORVASC   Diclofenac Sodium 1 % Crea     TAKE these medications   acetaminophen 325 MG tablet Commonly known as: TYLENOL Take 2 tablets (650 mg total) by mouth every 6 (six) hours as needed for mild pain (or Fever >/= 101).   ALPRAZolam 0.25 MG tablet Commonly known as: XANAX Take 1 tablet (0.25 mg total) by mouth at bedtime as needed for anxiety or sleep. What changed: See the new instructions.   alum &  mag hydroxide-simeth 200-200-20 MG/5ML suspension Commonly known as: MAALOX/MYLANTA Take 15 mLs by mouth every 4 (four) hours as needed for indigestion or heartburn.   aspirin 81 MG tablet Take 81 mg by mouth daily before breakfast.   atorvastatin 20 MG tablet Commonly known as: LIPITOR TAKE 1  TABLET BY MOUTH EVERY DAY What changed: when to take this   bisacodyl 5 MG EC tablet Commonly known as: DULCOLAX Take 2 tablets (10 mg total) by mouth daily.   calcium-vitamin D 500-200 MG-UNIT tablet Commonly known as: OSCAL WITH D Take 2 tablets by mouth daily.   cilostazol 50 MG tablet Commonly known as: PLETAL Take 1 tablet (50 mg total) by mouth 2 (two) times daily.   cyclobenzaprine 5 MG tablet Commonly known as: FLEXERIL Take 1 tablet (5 mg total) by mouth 3 (three) times daily as needed for muscle spasms.   docusate sodium 100 MG capsule Commonly known as: COLACE Take 2 capsules (200 mg total) by mouth daily.   gabapentin 100 MG capsule Commonly known as: NEURONTIN Take 1-3 capsules (100-300 mg total) by mouth at bedtime. What changed: how much to take   HYDROcodone-acetaminophen 7.5-325 MG tablet Commonly known as: NORCO Take 1 tablet by mouth every 4 (four) hours as needed for up to 7 days for moderate pain.   lidocaine 5 % Commonly known as: LIDODERM Place 1 patch onto the skin daily. Remove & Discard patch within 12 hours or as directed by MD   omeprazole 40 MG capsule Commonly known as: PRILOSEC Take 1 capsule (40 mg total) by mouth daily. Patient needs office visit for further refills   ondansetron 4 MG tablet Commonly known as: ZOFRAN Take 1 tablet (4 mg total) by mouth every 6 (six) hours as needed for nausea.   potassium chloride SA 20 MEQ tablet Commonly known as: KLOR-CON TAKE 1 TABLET(20 MEQ) BY MOUTH DAILY What changed: See the new instructions.       Contact information for follow-up providers    Burns, Jennifer Mo, MD Follow up.   Specialty: Internal Medicine Contact information: [address] [place] [address] [phone]        Jennifer Hazel, MD .   Specialty: Cardiology Contact information: [address] [place] [address] [phone]            Contact information for after-discharge  care    Destination    [facility] Preferred SNF .   Service: Skilled Nursing Contact information: [address] [place] [address] [phone]                 No Known Allergies  Consultations: Interventional radiology  Procedures/Studies: DG Lumbar Spine Complete  Result Date: 07/05/2019 CLINICAL DATA:  Back pain. Patient reports pain for months. EXAM: LUMBAR SPINE - COMPLETE 4+ VIEW COMPARISON:  Radiograph 3 days ago 07/02/2019. Reformats from abdominal CT 12/21/2019 FINDINGS: Levo scoliotic curvature of the lumbar spine of 40 degrees. Mild left lateral translation of L4 on L5, degenerative. Diffuse degenerative disc disease and facet hypertrophy. Vertebral body heights are preserved. No acute or compression fracture. The bones are under mineralized. Calcified aortic atherosclerosis. Aortic aneurysm demonstrated on prior CTA. IMPRESSION: Scoliosis and multilevel degenerative change in the lumbar spine. No evidence of acute abnormality. Electronically Signed   By: Narda Rutherford M.D.   On: 07/05/2019 16:39   MR Lumbar Spine W Wo Contrast  Result Date: 07/06/2019 CLINICAL DATA:  Low back pain for  several weeks EXAM: MRI LUMBAR SPINE WITHOUT AND WITH CONTRAST TECHNIQUE: Multiplanar and multiecho pulse sequences of the lumbar spine were obtained without and with intravenous contrast. CONTRAST:  80mL GADAVIST GADOBUTROL 1 MMOL/ML IV SOLN COMPARISON:  None similar FINDINGS: Segmentation:  5 lumbar type vertebrae Alignment: Marked levoscoliosis with Cobb angle measured on preceding radiography. Vertebrae: There is a horizontal fracture plane through the L1 superior endplate with marrow edema and enhancement consistent with recent injury. No evidence of underlying mass. Height loss measures up to 25%. Negative for retropulsion or posterior element involvement. Best seen on T1 weighted imaging there is bilateral vertical sacral insufficiency. Conus  medullaris and cauda equina: Conus extends to the L1 level. Conus and cauda equina appear normal. Paraspinal and other soft tissues: Very tortuous abdominal aorta with aneurysm and left-sided mural thrombus. The aorta was evaluated by CTA last month. T2 hyperintensity in the central right liver correlating with hemangioma on CT. Large hiatal hernia. Disc levels: T12- L1: Disc narrowing and asymmetric left-sided height loss with bulging. No neural impingement L1-L2: Disc narrowing and bulging with endplate spurring. No impingement L2-L3: Disc narrowing with asymmetric right-sided collapse and spurring. Noncompressive right foraminal narrowing. Patent spinal canal L3-L4: Disc narrowing and endplate degeneration with right far-lateral spurring. Mild facet spurring. L4-L5: Disc narrowing and bulging. Degenerative facet spurring. Moderate spinal stenosis with bilateral subarticular recess narrowing that could affect either L5 nerve root. No foraminal compression L5-S1:Disc narrowing with asymmetric left-sided height loss and bulging. Degenerative facet spurring asymmetric to the left. There is left foraminal impingement. Patent spinal canal. IMPRESSION: 1. L1 acute or subacute compression fracture with 25% height loss. 2. Acute or subacute bilateral sacral ala insufficiency fracture. 3. Diffuse degenerative disease with scoliosis. 4. L4-5 moderate spinal stenosis. Either L5 nerve root could be affected in the subarticular recesses. 5. L5-S1 left foraminal impingement. Electronically Signed   By: Marnee Spring M.D.   On: 07/06/2019 11:35   IR KYPHO LUMBAR INC FX REDUCE BONE BX UNI/BIL CANNULATION INC/IMAGING  Result Date: 07/11/2019 CLINICAL DATA:  Low back pain. MR demonstrates subacute unhealed L1 compression fracture deformity, and bilateral sacral ala insufficiency fractures. EXAM: KYPHOPLASTY AT LUMBAR L1 TECHNIQUE: The procedure, risks (including but not limited to bleeding, infection, organ damage), benefits,  and alternatives were explained to the patient. Questions regarding the procedure were encouraged and answered. The patient understands and consents to the procedure. The patient was placed prone on the fluoroscopic table. The skin overlying the upper lumbar and sacral region was then prepped and draped in the usual sterile fashion. Maximal barrier sterile technique was utilized including caps, mask, sterile gowns, sterile gloves, sterile drape, hand hygiene and skin antiseptic. Intravenous Fentanyl and Versed 4mg  were administered as conscious sedation during continuous monitoring of the patient's level of consciousness and physiological / cardiorespiratory status by the radiology RN, with a total moderate sedation time of 37 minutes. As antibiotic prophylaxis, cefazolin 2 g was ordered pre-procedure and administered intravenously within !one hour! of incision. Initially, fluoroscopic inspection of the sacrum was performed in expectation of bilateral sacroplasty. However, due to a combination of the patient's osteopenia, scoliosis, and overlying projection of bowel gas and significant vascular calcifications on fluoroscopy, the anatomic bony landmarks to allow safe sacroplasty under fluoroscopy could not be confidently determined. Sacroplasty was deferred. The right pedicle at lumbar L1 was then infiltrated with 1% lidocaine followed by the advancement of a Kyphon trocar needle through the right pedicle into the posterior one-third. The trocar was removed  and the osteo drill was advanced to the anterior third of the vertebral body. The osteo drill was retracted. Through the working cannula, a Kyphon inflatable bone tamp 15 x 2 was advanced and positioned with the distal marker 5 mm from the anterior aspect of the cortex. Crossing of the midline was seen on the AP projection. At this time, the balloon was expanded using contrast via a Kyphon inflation syringe device via micro tubing. Inflations were continued  until there was near apposition with the superior and the inferior end plates. At this time, methylmethacrylate mixture was reconstituted in the Kyphon bone mixing device system. This was then loaded into the delivery mechanism, attached to Kyphon bone fillers. The balloon was deflated and removed followed by the instillation of methylmethacrylate mixture with excellent filling in the AP and lateral projections. No extravasation was noted in the disk spaces or posteriorly into the spinal canal. No epidural venous contamination was seen. The patient tolerated the procedure well. There were no acute complications. The working cannulae and the bone filler were then retrieved and removed. COMPLICATIONS: COMPLICATIONS None immediate. IMPRESSION: 1. Status post vertebral body augmentation using balloon kyphoplasty at lumbar L1 as described without event. 2. Sacroplasty under fluoroscopy was deferred because of poor visualization. We can reassess the patient next week and if pain response inadequate, we can set up sacroplasty under CT guidance. 3. Per CMS PQRS reporting requirements (PQRS Measure 24): Given the patient's age of greater than 50 and the fracture site (hip, distal radius, or spine), the patient should be tested for osteoporosis using DXA, and the appropriate treatment considered based on the DXA results. Electronically Signed   By: Corlis Leak M.D.   On: 07/11/2019 16:39    (Echo, Carotid, EGD, Colonoscopy, ERCP)    Subjective:  Patient resting in bed awake alert anxious to start rehab Discharge Exam: Vitals:   07/17/19 2150 07/18/19 0527  BP: 126/81 129/84  Pulse: 93 97  Resp: 18 18  Temp: 99.1 F (37.3 C) 97.9 F (36.6 C)  SpO2: 96% 97%   Vitals:   07/17/19 0503 07/17/19 1342 07/17/19 2150 07/18/19 0527  BP: 126/68 96/65 126/81 129/84  Pulse: 81 86 93 97  Resp: 18 18 18 18   Temp: 98.3 F (36.8 C) 98.2 F (36.8 C) 99.1 F (37.3 C) 97.9 F (36.6 C)  TempSrc: Oral Oral    SpO2:  94% 97% 96% 97%  Weight:      Height:        General: Pt is alert, awake, not in acute distress Cardiovascular: RRR, S1/S2 +, no rubs, no gallops Respiratory: CTA bilaterally, no wheezing, no rhonchi Abdominal: Soft, NT, ND, bowel sounds + Extremities: no edema, no cyanosis    The results of significant diagnostics from this hospitalization (including imaging, microbiology, ancillary and laboratory) are listed below for reference.     Microbiology: Recent Results (from the past 240 hour(s))  SARS CORONAVIRUS 2 (TAT 6-24 HRS) Nasopharyngeal Nasopharyngeal Swab     Status: None   Collection Time: 07/17/19 12:26 PM   Specimen: Nasopharyngeal Swab  Result Value Ref Range Status   SARS Coronavirus 2 NEGATIVE NEGATIVE Final    Comment: (NOTE) SARS-CoV-2 target nucleic acids are NOT DETECTED. The SARS-CoV-2 RNA is generally detectable in upper and lower respiratory specimens during the acute phase of infection. Negative results do not preclude SARS-CoV-2 infection, do not rule out co-infections with other pathogens, and should not be used as the sole basis for treatment or other  patient management decisions. Negative results must be combined with clinical observations, patient history, and epidemiological information. The expected result is Negative. Fact Sheet for Patients: SugarRoll.be Fact Sheet for Healthcare Providers: https://www.woods-.com/ This test is not yet approved or cleared by the [place] FDA and  has been authorized for detection and/or diagnosis of SARS-CoV-2 by FDA under an Emergency Use Authorization (EUA). This EUA will remain  in effect (meaning this test can be used) for the duration of the COVID-19 declaration under Section 56 4(b)(1) of the Act, 21 U.S.C. section 360bbb-3(b)(1), unless the authorization is terminated or revoked sooner. Performed at [facility], [place] [address] [place],  [place] [address]      Labs: BNP (last 3 results) No results for input(s): BNP in the last 8760 hours. Basic Metabolic Panel: Recent Labs  Lab 07/12/19 0547 07/16/19 0538  NA 134* 134*  K 4.1 4.7  CL 98 99  CO2 24 27  GLUCOSE 87 92  BUN 20 18  CREATININE 0.94 0.79  CALCIUM 9.0 8.9   Liver Function Tests: No results for input(s): AST, ALT, ALKPHOS, BILITOT, PROT, ALBUMIN in the last 168 hours. No results for input(s): LIPASE, AMYLASE in the last 168 hours. No results for input(s): AMMONIA in the last 168 hours. CBC: Recent Labs  Lab 07/12/19 0547 07/16/19 0538  WBC 9.2 7.1  NEUTROABS 6.7  --   HGB 10.5* 9.9*  HCT 32.6* 31.8*  MCV 84.0 85.9  PLT 375 403*   Cardiac Enzymes: No results for input(s): CKTOTAL, CKMB, CKMBINDEX, TROPONINI in the last 168 hours. BNP: Invalid input(s): POCBNP CBG: Recent Labs  Lab 07/17/19 2152  GLUCAP 98   D-Dimer No results for input(s): DDIMER in the last 72 hours. Hgb A1c No results for input(s): HGBA1C in the last 72 hours. Lipid Profile No results for input(s): CHOL, HDL, LDLCALC, TRIG, CHOLHDL, LDLDIRECT in the last 72 hours. Thyroid function studies No results for input(s): TSH, T4TOTAL, T3FREE, THYROIDAB in the last 72 hours.  Invalid input(s): FREET3 Anemia work up No results for input(s): VITAMINB12, FOLATE, FERRITIN, TIBC, IRON, RETICCTPCT in the last 72 hours. Urinalysis    Component Value Date/Time   COLORURINE YELLOW 07/06/2019 0708   APPEARANCEUR CLEAR 07/06/2019 0708   LABSPEC 1.013 07/06/2019 0708   PHURINE 5.0 07/06/2019 0708   GLUCOSEU NEGATIVE 07/06/2019 0708   GLUCOSEU NEGATIVE 04/21/2015 1135   HGBUR MODERATE (A) 07/06/2019 0708   BILIRUBINUR NEGATIVE 07/06/2019 0708   KETONESUR NEGATIVE 07/06/2019 0708   PROTEINUR NEGATIVE 07/06/2019 0708   UROBILINOGEN 0.2 04/21/2015 1135   NITRITE NEGATIVE 07/06/2019 0708   LEUKOCYTESUR NEGATIVE 07/06/2019 0708   Sepsis Labs Invalid input(s): PROCALCITONIN,  WBC,   LACTICIDVEN Microbiology Recent Results (from the past 240 hour(s))  SARS CORONAVIRUS 2 (TAT 6-24 HRS) Nasopharyngeal Nasopharyngeal Swab     Status: None   Collection Time: 07/17/19 12:26 PM   Specimen: Nasopharyngeal Swab  Result Value Ref Range Status   SARS Coronavirus 2 NEGATIVE NEGATIVE Final    Comment: (NOTE) SARS-CoV-2 target nucleic acids are NOT DETECTED. The SARS-CoV-2 RNA is generally detectable in upper and lower respiratory specimens during the acute phase of infection. Negative results do not preclude SARS-CoV-2 infection, do not rule out co-infections with other pathogens, and should not be used as the sole basis for treatment or other patient management decisions. Negative results must be combined with clinical observations, patient history, and epidemiological information. The expected result is Negative. Fact Sheet for Patients: SugarRoll.be Fact  Sheet for Healthcare Providers: quierodirigir.com This test is not yet approved or cleared by the [place] FDA and  has been authorized for detection and/or diagnosis of SARS-CoV-2 by FDA under an Emergency Use Authorization (EUA). This EUA will remain  in effect (meaning this test can be used) for the duration of the COVID-19 declaration under Section 56 4(b)(1) of the Act, 21 U.S.C. section 360bbb-3(b)(1), unless the authorization is terminated or revoked sooner. Performed at [facility], [address] [place], [place] [address]      Time coordinating discharge: 39 minutes  SIGNED:   Alwyn Ren, MD  [facility] 07/18/2019, 9:19 AM Pager   If 7PM-7AM, please contact night-coverage [email or web address] Password [email or web address]

## 2019-07-20 DIAGNOSIS — S32010D Wedge compression fracture of first lumbar vertebra, subsequent encounter for fracture with routine healing: Secondary | ICD-10-CM | POA: Diagnosis not present

## 2019-07-20 DIAGNOSIS — M810 Age-related osteoporosis without current pathological fracture: Secondary | ICD-10-CM | POA: Diagnosis not present

## 2019-07-20 DIAGNOSIS — J449 Chronic obstructive pulmonary disease, unspecified: Secondary | ICD-10-CM | POA: Diagnosis not present

## 2019-07-20 DIAGNOSIS — E46 Unspecified protein-calorie malnutrition: Secondary | ICD-10-CM | POA: Diagnosis not present

## 2019-07-20 DIAGNOSIS — K5900 Constipation, unspecified: Secondary | ICD-10-CM | POA: Diagnosis not present

## 2019-07-23 ENCOUNTER — Telehealth: Payer: Self-pay | Admitting: Internal Medicine

## 2019-07-23 NOTE — Telephone Encounter (Signed)
Insurance has been submitted and verified for Prolia. Patient is responsible for a $255 copay. Due anytime. Left message for patient to call back to schedule.  Okay to schedule... Visit Note: Prolia ($255 copay - okay to give per Carson) Visit Type: Nurse Provider: Nurse 

## 2019-07-28 ENCOUNTER — Telehealth: Payer: Self-pay

## 2019-07-28 NOTE — Telephone Encounter (Signed)
Can she come in sooner than 4/12?

## 2019-07-28 NOTE — Telephone Encounter (Signed)
Per pt, requests Prolia during OV with Dr. Lawerance Bach 08/04/2019, noted.

## 2019-07-28 NOTE — Telephone Encounter (Signed)
Patient calling and is requesting "pain killers." States that she was discharged from rehab and they did not prescribed anything. Advised that Dr Lawerance Bach would need to see her before prescribing anything. States that she has an appointment on 08/04/2019. Please advise.   [id number] - [place], [place] - [address] AT [facility] OF [address]

## 2019-07-29 ENCOUNTER — Telehealth: Payer: Self-pay | Admitting: Internal Medicine

## 2019-07-29 DIAGNOSIS — M5124 Other intervertebral disc displacement, thoracic region: Secondary | ICD-10-CM | POA: Diagnosis not present

## 2019-07-29 DIAGNOSIS — M4804 Spinal stenosis, thoracic region: Secondary | ICD-10-CM | POA: Diagnosis not present

## 2019-07-29 DIAGNOSIS — M8008XD Age-related osteoporosis with current pathological fracture, vertebra(e), subsequent encounter for fracture with routine healing: Secondary | ICD-10-CM | POA: Diagnosis not present

## 2019-07-29 DIAGNOSIS — M48061 Spinal stenosis, lumbar region without neurogenic claudication: Secondary | ICD-10-CM | POA: Diagnosis not present

## 2019-07-29 DIAGNOSIS — M4808 Spinal stenosis, sacral and sacrococcygeal region: Secondary | ICD-10-CM | POA: Diagnosis not present

## 2019-07-29 NOTE — Telephone Encounter (Signed)
Threasa Alpha called today and said that patient was in hospital and then she went to a skilled nursing facility and just got out and he did the evaluation today.  Patient does report moderate to severe pain in her left hip that is paired with movement, she was in the hospital because of a fracture in her lower back and found out that she has a triple A. He also wanted to verify what she would be taking for pain control. Medicine that she used to take but is not her on medication list, medication is Amlodipine. Verbal orders for physical therapy for two times a week for four weeks. Referral  for occupational therapy for ADL's.    [phone]

## 2019-07-29 NOTE — Telephone Encounter (Signed)
States she can wait until her appointment to discuss pain meds. She can tolerate pain now with tylenol.

## 2019-07-30 NOTE — Telephone Encounter (Signed)
Let Rosanne Ashing know response below.

## 2019-07-30 NOTE — Telephone Encounter (Signed)
If they discontinued the amlodipine and the hospital then I would say to continue to hold.  I have not seen her since the hospital so I do not know what her blood pressure is.  They did discharge her with a short supply of pain medication.  She needs follow-up with me to get more pain medication.

## 2019-07-30 NOTE — Telephone Encounter (Signed)
LVM for Jim to call back in regards.  

## 2019-07-30 NOTE — Telephone Encounter (Signed)
It looks like her amlodipine was dc'd on 3/26. Will you clarify pt is not suppose to take it?

## 2019-07-31 ENCOUNTER — Telehealth: Payer: Self-pay

## 2019-07-31 DIAGNOSIS — M4804 Spinal stenosis, thoracic region: Secondary | ICD-10-CM | POA: Diagnosis not present

## 2019-07-31 DIAGNOSIS — M8008XD Age-related osteoporosis with current pathological fracture, vertebra(e), subsequent encounter for fracture with routine healing: Secondary | ICD-10-CM | POA: Diagnosis not present

## 2019-07-31 DIAGNOSIS — M48061 Spinal stenosis, lumbar region without neurogenic claudication: Secondary | ICD-10-CM | POA: Diagnosis not present

## 2019-07-31 DIAGNOSIS — M4808 Spinal stenosis, sacral and sacrococcygeal region: Secondary | ICD-10-CM | POA: Diagnosis not present

## 2019-07-31 DIAGNOSIS — M5124 Other intervertebral disc displacement, thoracic region: Secondary | ICD-10-CM | POA: Diagnosis not present

## 2019-07-31 NOTE — Telephone Encounter (Signed)
New message    The patient decline OT eval this week  Agreed to next week   Need a verbal okay from the MD.

## 2019-07-31 NOTE — Telephone Encounter (Signed)
Gave ok for orders on VM

## 2019-08-01 DIAGNOSIS — S32010A Wedge compression fracture of first lumbar vertebra, initial encounter for closed fracture: Secondary | ICD-10-CM | POA: Insufficient documentation

## 2019-08-01 NOTE — Progress Notes (Signed)
Subjective:    Patient ID: Jennifer [name], female    DOB: [birth year], 79 y.o.   MRN: [id number]  HPI The patient is here for follow up from hospitalization/rehab.  Admitted 07/05/19 - 07/18/19 to Rehab  Admitted for severe pain back d/t wedge compression fracture L1  She went to the ED with severe lower back pain and was admitted for pain control.  She had seen ortho and was diagnosed with a compression fracture and went to the ED due to severe pain.    Severe lower back pain, L1 compression fx MRI  - L1 acute of subacute compression fx with 25% height loss, diffuse degenerative dz with scoliosis, L4-5 moderate spinal stenosis, L5-S1 left foraminal impingement IR - s/p kyphoplasty on 07/11/19, deferred sacroplasty due to poor fluoro visualized anatomy (scoliosis, osteopenia) PT rec SNF, which she went to  Chronic hypokalemia Resolved  Hypertension: norvasc d/c'd due to low BP  Infrarenal AAA, 5.4 cm Has seen vascular - they will follow  Anxiety Xanax  Hyperlipidemia Statin  PAD cilostazol  She is home from rehab.  The physical therapy at rehab did help.  She is currently doing home PT.  Back pain, L1 compression fracture, sacral insufficiency fractures: Her pain is currently 6/10.  She took plain Tylenol, but that did not help.  She has been taking leftover Vicodin 7.5-325 mg-half of the pill as needed.  Some days she does not take it at all and often she will just take it once a day.  It does help with the pain.  The pain is lower in the sacral region.  She wonders about further treatment for this.  She has been able to walk around her house with a walker and more recently without a walker.  She is able to do everything she needs to do at home.  Her family is getting her groceries.  She has been taking her blood pressure at home and has ranged from 111-150/70s-88.  She denies any chest pain, palpitations or shortness of breath.  She has quit smoking.  She has had  some constipation.  She has been taking the stool softener laxative as needed.  She was told her vitamin d level was on the low side.     Medications and allergies reviewed with patient and updated if appropriate.  Patient Active Problem List   Diagnosis Date Noted  . Closed compression fracture of L1 vertebra ([place]) 08/01/2019  . Back pain 07/05/2019  . Lumbar radiculopathy 06/27/2019  . Lung nodule 06/10/2019  . Abdominal aortic aneurysm (AAA) ([place]) 06/10/2019  . Chest pain, musculoskeletal 06/10/2019  . Ascending aortic aneurysm ([place]) 06/10/2019  . Hiatal hernia, large 01/09/2018  . GERD (gastroesophageal reflux disease) 12/08/2016  . History of Clostridium difficile infection 11/29/2016  . Prediabetes 11/02/2016  . Carotid arterial disease ([place]) 10/20/2015  . Anxiety state   . PAD (peripheral artery disease) ([place]) 10/10/2011  . Osteoporosis, senile 05/17/2011  . Dyslipidemia 05/03/2009  . Tobacco abuse 04/14/2009  . Essential hypertension 04/14/2009    Current Outpatient Medications on File Prior to Visit  Medication Sig Dispense Refill  . acetaminophen (TYLENOL) 325 MG tablet Take 2 tablets (650 mg total) by mouth every 6 (six) hours as needed for mild pain (or Fever >/= 101).    Marland Kitchen ALPRAZolam (XANAX) 0.25 MG tablet Take 1 tablet (0.25 mg total) by mouth at bedtime as needed for anxiety or sleep. 30 tablet 0  . alum & mag hydroxide-simeth (MAALOX/MYLANTA) 200-200-20  MG/5ML suspension Take 15 mLs by mouth every 4 (four) hours as needed for indigestion or heartburn. 355 mL 0  . aspirin 81 MG tablet Take 81 mg by mouth daily before breakfast.     . atorvastatin (LIPITOR) 20 MG tablet TAKE 1 TABLET BY MOUTH EVERY DAY (Patient taking differently: Take 20 mg by mouth daily at 6 PM. ) 90 tablet 1  . bisacodyl (DULCOLAX) 5 MG EC tablet Take 2 tablets (10 mg total) by mouth daily. 30 tablet 0  . calcium-vitamin D (OSCAL WITH D) 500-200 MG-UNIT per tablet Take 2 tablets by mouth daily.     . cilostazol (PLETAL) 50 MG tablet Take 1 tablet (50 mg total) by mouth 2 (two) times daily. 180 tablet 1  . cyclobenzaprine (FLEXERIL) 5 MG tablet Take 1 tablet (5 mg total) by mouth 3 (three) times daily as needed for muscle spasms. 30 tablet 0  . docusate sodium (COLACE) 100 MG capsule Take 2 capsules (200 mg total) by mouth daily. 10 capsule 0  . gabapentin (NEURONTIN) 100 MG capsule Take 1-3 capsules (100-300 mg total) by mouth at bedtime. (Patient taking differently: Take 100 mg by mouth at bedtime. ) 90 capsule 3  . hydrocortisone cream 1 % Apply topically 2 (two) times daily as needed for itching. 30 g 0  . lidocaine (LIDODERM) 5 % Place 1 patch onto the skin daily. Remove & Discard patch within 12 hours or as directed by MD 30 patch 0  . omeprazole (PRILOSEC) 40 MG capsule Take 1 capsule (40 mg total) by mouth daily. Patient needs office visit for further refills 90 capsule 0  . ondansetron (ZOFRAN) 4 MG tablet Take 1 tablet (4 mg total) by mouth every 6 (six) hours as needed for nausea. 20 tablet 0  . potassium chloride SA (K-DUR) 20 MEQ tablet TAKE 1 TABLET(20 MEQ) BY MOUTH DAILY (Patient taking differently: Take 20 mEq by mouth daily. ) 90 tablet 3   No current facility-administered medications on file prior to visit.    Past Medical History:  Diagnosis Date  . ANEMIA-NOS   . Cataract    right cataract removed  . Clotting disorder (HCC)    Pletal  . Depression    no longer taking meds/ resolved  . DYSLIPIDEMIA   . GERD (gastroesophageal reflux disease)   . Hemorrhoids   . HYPERTENSION   . Negative colorectal cancer screening using Cologuard test   . Osteoporosis, senile    DEXA 03/2014: -3.3 R fem  . Peripheral vascular disease, unspecified (HCC) dx 02/2010 ABI  . SMOKER     Past Surgical History:  Procedure Laterality Date  . CATARACT EXTRACTION W/ INTRAOCULAR LENS IMPLANT Right 03/12/14   groat  . HEMORRHOID SURGERY  05/18/2011   Procedure: HEMORRHOIDECTOMY;   Surgeon: Robyne Askew, MD;  Location: [facility];  Service: General;  Laterality: N/A;  hemorrhoidectomy, three columns, internal and external  . IR KYPHO LUMBAR INC FX REDUCE BONE BX UNI/BIL CANNULATION INC/IMAGING  07/11/2019  . TONSILLECTOMY  1946  . TUBAL LIGATION  1979  . UPPER GASTROINTESTINAL ENDOSCOPY  01/03/2018    Social History   Socioeconomic History  . Marital status: Single    Spouse name: Not on file  . Number of children: Not on file  . Years of education: Not on file  . Highest education level: Not on file  Occupational History  . Occupation: retired  Tobacco Use  . Smoking status: Current Every Day Smoker  Packs/day: 0.50    Years: 62.00    Pack years: 31.00    Types: Cigarettes  . Smokeless tobacco: Never Used  Substance and Sexual Activity  . Alcohol use: Yes    Alcohol/week: 0.0 standard drinks    Comment: Social- 1 scotch, 1 glass wine weekly  . Drug use: No  . Sexual activity: Not Currently    Birth control/protection: Post-menopausal  Other Topics Concern  . Not on file  Social History Narrative   Single, lives alone. Retired Pensions consultant. Enjoys Agricultural consultant work.   Social Determinants of Health   Financial Resource Strain: Low Risk   . Difficulty of Paying Living Expenses: Not hard at all  Food Insecurity: No Food Insecurity  . Worried About Programme researcher, broadcasting/film/video in the Last Year: Never true  . Ran Out of Food in the Last Year: Never true  Transportation Needs: No Transportation Needs  . Lack of Transportation (Medical): No  . Lack of Transportation (Non-Medical): No  Physical Activity: Sufficiently Active  . Days of Exercise per Week: 5 days  . Minutes of Exercise per Session: 30 min  Stress: No Stress Concern Present  . Feeling of Stress : Not at all  Social Connections: Unknown  . Frequency of Communication with Friends and Family: More than three times a week  . Frequency of Social Gatherings with Friends and Family: More than three  times a week  . Attends Religious Services: 1 to 4 times per year  . Active Member of Clubs or Organizations: Yes  . Attends Banker Meetings: More than 4 times per year  . Marital Status: Not on file    Family History  Problem Relation Age of Onset  . Breast cancer Mother   . Lung cancer Father   . Hypertension Brother   . Colon cancer Neg Hx   . Esophageal cancer Neg Hx   . Stomach cancer Neg Hx   . Rectal cancer Neg Hx     Review of Systems  Constitutional: Negative for chills and fever.  Respiratory: Negative for cough, shortness of breath and wheezing.   Cardiovascular: Negative for chest pain, palpitations and leg swelling.  Gastrointestinal: Positive for constipation.  Musculoskeletal: Positive for back pain (with radiation down her left leg).  Neurological: Negative for light-headedness and headaches.       Objective:   Vitals:   08/04/19 1300  BP: (!) 162/96  Pulse: 83  Resp: 16  Temp: 98.5 F (36.9 C)  SpO2: 95%   BP Readings from Last 3 Encounters:  08/04/19 (!) 162/96  07/18/19 129/84  07/02/19 126/67   Wt Readings from Last 3 Encounters:  08/04/19 88 lb (39.9 kg)  07/05/19 93 lb (42.2 kg)  07/02/19 93 lb (42.2 kg)   Body mass index is 15.11 kg/m.   Physical Exam    Constitutional: Appears well-developed and well-nourished. No distress.  HENT:  Head: Normocephalic and atraumatic.  Neck: Neck supple. No tracheal deviation present. No thyromegaly present.  No cervical lymphadenopathy Cardiovascular: Normal rate, regular rhythm and normal heart sounds.  2/6 systolic murmur heard. No carotid bruit .  No edema Pulmonary/Chest: Effort normal and breath sounds normal. No respiratory distress. No has no wheezes. No rales.  Skin: Skin is warm and dry. Not diaphoretic.  Psychiatric: Normal mood and affect. Behavior is normal.      Assessment & Plan:    See Problem List for Assessment and Plan of chronic medical problems.    This  visit occurred during the SARS-CoV-2 public health emergency.  Safety protocols were in place, including screening questions prior to the visit, additional usage of staff PPE, and extensive cleaning of exam room while observing appropriate contact time as indicated for disinfecting solutions.

## 2019-08-04 ENCOUNTER — Other Ambulatory Visit: Payer: Self-pay | Admitting: Internal Medicine

## 2019-08-04 ENCOUNTER — Encounter: Payer: Self-pay | Admitting: Internal Medicine

## 2019-08-04 ENCOUNTER — Ambulatory Visit (INDEPENDENT_AMBULATORY_CARE_PROVIDER_SITE_OTHER): Payer: Medicare Other | Admitting: Internal Medicine

## 2019-08-04 ENCOUNTER — Other Ambulatory Visit: Payer: Self-pay

## 2019-08-04 VITALS — BP 162/96 | HR 83 | Temp 98.5°F | Resp 16 | Ht 64.0 in | Wt 88.0 lb

## 2019-08-04 DIAGNOSIS — E785 Hyperlipidemia, unspecified: Secondary | ICD-10-CM

## 2019-08-04 DIAGNOSIS — S32010A Wedge compression fracture of first lumbar vertebra, initial encounter for closed fracture: Secondary | ICD-10-CM

## 2019-08-04 DIAGNOSIS — I714 Abdominal aortic aneurysm, without rupture, unspecified: Secondary | ICD-10-CM

## 2019-08-04 DIAGNOSIS — E559 Vitamin D deficiency, unspecified: Secondary | ICD-10-CM

## 2019-08-04 DIAGNOSIS — M810 Age-related osteoporosis without current pathological fracture: Secondary | ICD-10-CM | POA: Diagnosis not present

## 2019-08-04 DIAGNOSIS — I739 Peripheral vascular disease, unspecified: Secondary | ICD-10-CM | POA: Diagnosis not present

## 2019-08-04 DIAGNOSIS — I10 Essential (primary) hypertension: Secondary | ICD-10-CM | POA: Diagnosis not present

## 2019-08-04 DIAGNOSIS — M8448XA Pathological fracture, other site, initial encounter for fracture: Secondary | ICD-10-CM | POA: Insufficient documentation

## 2019-08-04 MED ORDER — HYDROCODONE-ACETAMINOPHEN 7.5-325 MG PO TABS: 1.0000 | ORAL_TABLET | Freq: Every day | ORAL | 0 refills | Status: DC | PRN

## 2019-08-04 MED ORDER — VITAMIN D (ERGOCALCIFEROL) 1.25 MG (50000 UNIT) PO CAPS: 50000.0000 [IU] | ORAL_CAPSULE | ORAL | 0 refills | Status: DC

## 2019-08-04 MED ORDER — BISACODYL 5 MG PO TBEC: 10.0000 mg | DELAYED_RELEASE_TABLET | Freq: Every day | ORAL | 5 refills | Status: DC | PRN

## 2019-08-04 MED ORDER — AMLODIPINE BESYLATE 2.5 MG PO TABS: 2.5000 mg | ORAL_TABLET | Freq: Every day | ORAL | 1 refills | Status: DC

## 2019-08-04 MED ORDER — DENOSUMAB 60 MG/ML ~~LOC~~ SOSY
60.0000 mg | PREFILLED_SYRINGE | Freq: Once | SUBCUTANEOUS | Status: AC
  Administered 2019-08-04: 60 mg via SUBCUTANEOUS

## 2019-08-04 NOTE — Patient Instructions (Addendum)
Your prolia injection was given today.    Medications reviewed and updated.  Changes include :   Start amlodipine 2.5 mg daily.  High dose vitamin d once a week for two months.  Ok to continue the vicodin as needed.    Your prescription(s) have been submitted to your pharmacy. Please take as directed and contact our office if you believe you are having problem(s) with the medication(s).  A referral was ordered for orthopedics.  Someone will call you to schedule this.    Please followup in 1 month

## 2019-08-04 NOTE — Assessment & Plan Note (Signed)
Chronic Amlodipine discontinued while in the hospital secondary to low BP Blood pressure elevated at home on occasion, but is elevated here today Restart amlodipine at 2.5 mg daily-was on 5 mg previously She will continue to monitor at home and we may need to titrate up if needed Follow-up in 1 month

## 2019-08-04 NOTE — Assessment & Plan Note (Signed)
Acute She had kyphoplasty 3/19 and pain much improved Continue PT Continue increased activity Continue Prolia, calcium and vitamin D given osteoporosis

## 2019-08-04 NOTE — Assessment & Plan Note (Signed)
She was told her vitamin d level was on the low side  Will rx high dose x 2 months

## 2019-08-04 NOTE — Assessment & Plan Note (Signed)
Chronic Continue pletal

## 2019-08-04 NOTE — Assessment & Plan Note (Signed)
Chronic Continue atorvastatin 

## 2019-08-04 NOTE — Assessment & Plan Note (Signed)
Following with vascular 

## 2019-08-04 NOTE — Assessment & Plan Note (Addendum)
Acute From sacral insufficiency fractures-not able to be treated when she had the kyphoplasty due to poor visualization Continue Vicodin as needed for pain management-only taking half of a pill maybe once a day Will refer to orthopedics for further evaluation She does have, continue

## 2019-08-04 NOTE — Assessment & Plan Note (Addendum)
Chronic dexa up to date 06/2019: L1 compression fx and sacral insufficiency fx s/p kyphoplasty On prolia since 06/2016 - continue, injection today Congratulated on smoking she has quit smoking Taking calcium and vitamin d

## 2019-08-05 DIAGNOSIS — M4808 Spinal stenosis, sacral and sacrococcygeal region: Secondary | ICD-10-CM | POA: Diagnosis not present

## 2019-08-05 DIAGNOSIS — M8008XD Age-related osteoporosis with current pathological fracture, vertebra(e), subsequent encounter for fracture with routine healing: Secondary | ICD-10-CM | POA: Diagnosis not present

## 2019-08-05 DIAGNOSIS — M48061 Spinal stenosis, lumbar region without neurogenic claudication: Secondary | ICD-10-CM | POA: Diagnosis not present

## 2019-08-05 DIAGNOSIS — M4804 Spinal stenosis, thoracic region: Secondary | ICD-10-CM | POA: Diagnosis not present

## 2019-08-05 DIAGNOSIS — M5124 Other intervertebral disc displacement, thoracic region: Secondary | ICD-10-CM | POA: Diagnosis not present

## 2019-08-07 DIAGNOSIS — I700 Atherosclerosis of aorta: Secondary | ICD-10-CM

## 2019-08-07 DIAGNOSIS — Z7902 Long term (current) use of antithrombotics/antiplatelets: Secondary | ICD-10-CM

## 2019-08-07 DIAGNOSIS — D649 Anemia, unspecified: Secondary | ICD-10-CM

## 2019-08-07 DIAGNOSIS — M8008XD Age-related osteoporosis with current pathological fracture, vertebra(e), subsequent encounter for fracture with routine healing: Secondary | ICD-10-CM | POA: Diagnosis not present

## 2019-08-07 DIAGNOSIS — M47816 Spondylosis without myelopathy or radiculopathy, lumbar region: Secondary | ICD-10-CM

## 2019-08-07 DIAGNOSIS — K5900 Constipation, unspecified: Secondary | ICD-10-CM

## 2019-08-07 DIAGNOSIS — K219 Gastro-esophageal reflux disease without esophagitis: Secondary | ICD-10-CM

## 2019-08-07 DIAGNOSIS — I714 Abdominal aortic aneurysm, without rupture: Secondary | ICD-10-CM

## 2019-08-07 DIAGNOSIS — Z87891 Personal history of nicotine dependence: Secondary | ICD-10-CM

## 2019-08-07 DIAGNOSIS — Z791 Long term (current) use of non-steroidal anti-inflammatories (NSAID): Secondary | ICD-10-CM

## 2019-08-07 DIAGNOSIS — E785 Hyperlipidemia, unspecified: Secondary | ICD-10-CM

## 2019-08-07 DIAGNOSIS — M48061 Spinal stenosis, lumbar region without neurogenic claudication: Secondary | ICD-10-CM | POA: Diagnosis not present

## 2019-08-07 DIAGNOSIS — M792 Neuralgia and neuritis, unspecified: Secondary | ICD-10-CM

## 2019-08-07 DIAGNOSIS — M8588 Other specified disorders of bone density and structure, other site: Secondary | ICD-10-CM

## 2019-08-07 DIAGNOSIS — M4804 Spinal stenosis, thoracic region: Secondary | ICD-10-CM | POA: Diagnosis not present

## 2019-08-07 DIAGNOSIS — Z7982 Long term (current) use of aspirin: Secondary | ICD-10-CM

## 2019-08-07 DIAGNOSIS — I739 Peripheral vascular disease, unspecified: Secondary | ICD-10-CM

## 2019-08-07 DIAGNOSIS — F329 Major depressive disorder, single episode, unspecified: Secondary | ICD-10-CM

## 2019-08-07 DIAGNOSIS — E43 Unspecified severe protein-calorie malnutrition: Secondary | ICD-10-CM

## 2019-08-07 DIAGNOSIS — K449 Diaphragmatic hernia without obstruction or gangrene: Secondary | ICD-10-CM

## 2019-08-07 DIAGNOSIS — M4186 Other forms of scoliosis, lumbar region: Secondary | ICD-10-CM

## 2019-08-07 DIAGNOSIS — M533 Sacrococcygeal disorders, not elsewhere classified: Secondary | ICD-10-CM

## 2019-08-07 DIAGNOSIS — I10 Essential (primary) hypertension: Secondary | ICD-10-CM

## 2019-08-07 DIAGNOSIS — M5126 Other intervertebral disc displacement, lumbar region: Secondary | ICD-10-CM

## 2019-08-07 DIAGNOSIS — Z9181 History of falling: Secondary | ICD-10-CM

## 2019-08-07 DIAGNOSIS — J449 Chronic obstructive pulmonary disease, unspecified: Secondary | ICD-10-CM

## 2019-08-07 DIAGNOSIS — M5124 Other intervertebral disc displacement, thoracic region: Secondary | ICD-10-CM

## 2019-08-07 DIAGNOSIS — M4808 Spinal stenosis, sacral and sacrococcygeal region: Secondary | ICD-10-CM | POA: Diagnosis not present

## 2019-08-07 DIAGNOSIS — F411 Generalized anxiety disorder: Secondary | ICD-10-CM

## 2019-08-12 ENCOUNTER — Telehealth: Payer: Self-pay | Admitting: Internal Medicine

## 2019-08-12 DIAGNOSIS — M4808 Spinal stenosis, sacral and sacrococcygeal region: Secondary | ICD-10-CM | POA: Diagnosis not present

## 2019-08-12 DIAGNOSIS — M5124 Other intervertebral disc displacement, thoracic region: Secondary | ICD-10-CM | POA: Diagnosis not present

## 2019-08-12 DIAGNOSIS — M4804 Spinal stenosis, thoracic region: Secondary | ICD-10-CM | POA: Diagnosis not present

## 2019-08-12 DIAGNOSIS — M8008XD Age-related osteoporosis with current pathological fracture, vertebra(e), subsequent encounter for fracture with routine healing: Secondary | ICD-10-CM | POA: Diagnosis not present

## 2019-08-12 DIAGNOSIS — M48061 Spinal stenosis, lumbar region without neurogenic claudication: Secondary | ICD-10-CM | POA: Diagnosis not present

## 2019-08-12 NOTE — Telephone Encounter (Signed)
New message:   Pt's sister in law is calling and states she dropped off some paperwork on yesterday in regards to the pt and some paperwork needed for [facility]. She states she brought these papers up here on yesterday 08/11/19. Please advise we have received.

## 2019-08-13 NOTE — Telephone Encounter (Signed)
LVM letting Jennifer [name] know that paper work was received and will be faxed when completed.

## 2019-08-14 ENCOUNTER — Telehealth: Payer: Self-pay

## 2019-08-14 DIAGNOSIS — M4804 Spinal stenosis, thoracic region: Secondary | ICD-10-CM | POA: Diagnosis not present

## 2019-08-14 DIAGNOSIS — M5124 Other intervertebral disc displacement, thoracic region: Secondary | ICD-10-CM | POA: Diagnosis not present

## 2019-08-14 DIAGNOSIS — M8008XD Age-related osteoporosis with current pathological fracture, vertebra(e), subsequent encounter for fracture with routine healing: Secondary | ICD-10-CM | POA: Diagnosis not present

## 2019-08-14 DIAGNOSIS — M48061 Spinal stenosis, lumbar region without neurogenic claudication: Secondary | ICD-10-CM | POA: Diagnosis not present

## 2019-08-14 DIAGNOSIS — M4808 Spinal stenosis, sacral and sacrococcygeal region: Secondary | ICD-10-CM | POA: Diagnosis not present

## 2019-08-14 NOTE — Telephone Encounter (Signed)
Form for [facility] was faxed in.

## 2019-08-18 DIAGNOSIS — M4856XD Collapsed vertebra, not elsewhere classified, lumbar region, subsequent encounter for fracture with routine healing: Secondary | ICD-10-CM | POA: Diagnosis not present

## 2019-08-18 DIAGNOSIS — M545 Low back pain: Secondary | ICD-10-CM | POA: Diagnosis not present

## 2019-08-18 DIAGNOSIS — M8438XD Stress fracture, other site, subsequent encounter for fracture with routine healing: Secondary | ICD-10-CM | POA: Diagnosis not present

## 2019-08-19 DIAGNOSIS — M5416 Radiculopathy, lumbar region: Secondary | ICD-10-CM | POA: Diagnosis not present

## 2019-08-20 DIAGNOSIS — M4804 Spinal stenosis, thoracic region: Secondary | ICD-10-CM | POA: Diagnosis not present

## 2019-08-20 DIAGNOSIS — M5124 Other intervertebral disc displacement, thoracic region: Secondary | ICD-10-CM | POA: Diagnosis not present

## 2019-08-20 DIAGNOSIS — M8008XD Age-related osteoporosis with current pathological fracture, vertebra(e), subsequent encounter for fracture with routine healing: Secondary | ICD-10-CM | POA: Diagnosis not present

## 2019-08-20 DIAGNOSIS — M48061 Spinal stenosis, lumbar region without neurogenic claudication: Secondary | ICD-10-CM | POA: Diagnosis not present

## 2019-08-20 DIAGNOSIS — M4808 Spinal stenosis, sacral and sacrococcygeal region: Secondary | ICD-10-CM | POA: Diagnosis not present

## 2019-08-22 ENCOUNTER — Telehealth: Payer: Self-pay

## 2019-08-22 DIAGNOSIS — M8008XD Age-related osteoporosis with current pathological fracture, vertebra(e), subsequent encounter for fracture with routine healing: Secondary | ICD-10-CM | POA: Diagnosis not present

## 2019-08-22 DIAGNOSIS — M4808 Spinal stenosis, sacral and sacrococcygeal region: Secondary | ICD-10-CM | POA: Diagnosis not present

## 2019-08-22 DIAGNOSIS — M48061 Spinal stenosis, lumbar region without neurogenic claudication: Secondary | ICD-10-CM | POA: Diagnosis not present

## 2019-08-22 DIAGNOSIS — M5124 Other intervertebral disc displacement, thoracic region: Secondary | ICD-10-CM | POA: Diagnosis not present

## 2019-08-22 DIAGNOSIS — M4804 Spinal stenosis, thoracic region: Secondary | ICD-10-CM | POA: Diagnosis not present

## 2019-08-22 NOTE — Telephone Encounter (Signed)
New message   [facility] calling   Patient d/c from home health pt met current goal recommending outpatient after she has her back injection by Dr. Nelva Bush.   Location:  patient preference.

## 2019-08-22 NOTE — Telephone Encounter (Signed)
noted 

## 2019-08-27 ENCOUNTER — Telehealth: Payer: Self-pay

## 2019-08-27 NOTE — Telephone Encounter (Signed)
   [facility] Pre-operative Risk Assessment    Request for surgical clearance:  1. What type of surgery is being performed? LUMBAR ESI   2. When is this surgery scheduled? 09/11/19   3. What type of clearance is required (medical clearance vs. Pharmacy clearance to hold med vs. Both)? PHARMACY   4. Are there any medications that need to be held prior to surgery and how long? PLETAL AND ASA 5 DAYS PRIOR TO PROCEDURE    5. Practice name and name of physician performing surgery? [facility]   6. What is your office phone number [phone]   7.   What is your office fax number [phone] ATTN Elianie  8.   Anesthesia type (None, local, MAC, general) ? NONE LISTED.   Jacinta Shoe 08/27/2019, 4:55 PM  _________________________________________________________________   (provider comments below)

## 2019-08-29 NOTE — Telephone Encounter (Signed)
   Primary Cardiologist: Verne Carrow, MD  Chart reviewed as part of pre-operative protocol coverage. We have been asked to provide guidance to hold ASA and pletal for ESI. Per Dr. Clifton James, OK to hold antiplatelets for 5 days prior to [facility].   I will route this recommendation to the requesting party via Epic fax function and remove from pre-op pool.  Please call with questions.  Roe Rutherford Duke, PA 08/29/2019, 10:12 AM

## 2019-08-29 NOTE — Telephone Encounter (Signed)
OK to hold ASA and Plavix for 5-7 days before the procedure. Verne Carrow

## 2019-08-29 NOTE — Telephone Encounter (Signed)
Dr. Clifton James We follow this patient for PAD and carotid artery stenosis, no recent interventions, no hx of MI. We are asked to hold ASA and pletal for ESI. Do you agree with holding for 5 days?

## 2019-09-02 ENCOUNTER — Ambulatory Visit: Payer: Medicare Other | Admitting: Orthopaedic Surgery

## 2019-09-02 NOTE — Patient Instructions (Addendum)
Medications reviewed and updated.  Changes include :   none  Your prescription(s) have been submitted to your pharmacy. Please take as directed and contact our office if you believe you are having problem(s) with the medication(s).     Please followup in 3 months   

## 2019-09-02 NOTE — Progress Notes (Signed)
Subjective:    Patient ID: Jennifer [name], female    DOB: 14-Dec-1940, 79 y.o.   MRN: [id number]  HPI The patient is here for follow up of their chronic medical problems, including vitamin d def, sacral insuff fractures, chronic pain back, OP on prolia, htn  Changes one month ago - high dose vitamin d started               Amlodipine 2.5 mg started                      vicodin continued  Lumbar compression fracture, sacral insufficiency fractures: She has seen ortho.  She had an x-ray that day.  She was referred to Dr. Horald Chestnut for pain management.  She was advised to use a walker for the next 3 months to avoid full weightbearing.  They advised continuing Prolia.  They advised aqua therapy starting in June.  She sees Dr Ethelene Hal on the 20th for an injection.    She states very slow improvement in the pain.  She is doing the PT exercises.  She is using the walker.  The vicodin does help the pain - it makes it tolerable.   BP at home 111/76-150/90.  She checks it regularly.   Medications and allergies reviewed with patient and updated if appropriate.  Patient Active Problem List   Diagnosis Date Noted  . Sacral insufficiency fracture 08/04/2019  . Vitamin D deficiency 08/04/2019  . Closed compression fracture of L1 vertebra (HCC) 08/01/2019  . Back pain 07/05/2019  . Lumbar radiculopathy 06/27/2019  . Lung nodule 06/10/2019  . Abdominal aortic aneurysm (AAA) (HCC) 06/10/2019  . Chest pain, musculoskeletal 06/10/2019  . Ascending aortic aneurysm (HCC) 06/10/2019  . Hiatal hernia, large 01/09/2018  . GERD (gastroesophageal reflux disease) 12/08/2016  . History of Clostridium difficile infection 11/29/2016  . Prediabetes 11/02/2016  . Carotid arterial disease (HCC) 10/20/2015  . Anxiety state   . PAD (peripheral artery disease) ([facility]) 10/10/2011  . Osteoporosis, senile 05/17/2011  . Dyslipidemia 05/03/2009  . Tobacco abuse 04/14/2009  . Essential hypertension 04/14/2009     Current Outpatient Medications on File Prior to Visit  Medication Sig Dispense Refill  . ALPRAZolam (XANAX) 0.25 MG tablet Take 1 tablet (0.25 mg total) by mouth at bedtime as needed for anxiety or sleep. 30 tablet 0  . amLODipine (NORVASC) 2.5 MG tablet Take 1 tablet (2.5 mg total) by mouth daily. 90 tablet 1  . aspirin 81 MG tablet Take 81 mg by mouth daily before breakfast.     . atorvastatin (LIPITOR) 20 MG tablet TAKE 1 TABLET BY MOUTH EVERY DAY (Patient taking differently: Take 20 mg by mouth daily at 6 PM. ) 90 tablet 1  . calcium-vitamin D (OSCAL WITH D) 500-200 MG-UNIT per tablet Take 2 tablets by mouth daily.    . cilostazol (PLETAL) 50 MG tablet Take 1 tablet (50 mg total) by mouth 2 (two) times daily. 180 tablet 1  . gabapentin (NEURONTIN) 100 MG capsule Take 1-3 capsules (100-300 mg total) by mouth at bedtime. (Patient taking differently: Take 100 mg by mouth at bedtime. ) 90 capsule 3  . omeprazole (PRILOSEC) 40 MG capsule Take 1 capsule (40 mg total) by mouth daily. Patient needs office visit for further refills 90 capsule 0  . potassium chloride SA (K-DUR) 20 MEQ tablet TAKE 1 TABLET(20 MEQ) BY MOUTH DAILY (Patient taking differently: Take 20 mEq by mouth daily. ) 90 tablet 3  .  Vitamin D, Ergocalciferol, (DRISDOL) 1.25 MG (50000 UNIT) CAPS capsule TAKE 1 CAPSULE BY MOUTH EVERY 7 DAYS 12 capsule 0   No current facility-administered medications on file prior to visit.    Past Medical History:  Diagnosis Date  . ANEMIA-NOS   . Cataract    right cataract removed  . Clotting disorder (HCC)    Pletal  . Depression    no longer taking meds/ resolved  . DYSLIPIDEMIA   . GERD (gastroesophageal reflux disease)   . Hemorrhoids   . HYPERTENSION   . Negative colorectal cancer screening using Cologuard test   . Osteoporosis, senile    DEXA 03/2014: -3.3 R fem  . Peripheral vascular disease, unspecified ([place]) dx 02/2010 ABI  . SMOKER     Past Surgical History:  Procedure  Laterality Date  . CATARACT EXTRACTION W/ INTRAOCULAR LENS IMPLANT Right 03/12/14   groat  . HEMORRHOID SURGERY  05/18/2011   Procedure: HEMORRHOIDECTOMY;  Surgeon: Merrie Roof, MD;  Location: [facility];  Service: General;  Laterality: N/A;  hemorrhoidectomy, three columns, internal and external  . IR KYPHO LUMBAR INC FX REDUCE BONE BX UNI/BIL CANNULATION INC/IMAGING  07/11/2019  . TONSILLECTOMY  1946  . TUBAL LIGATION  1979  . UPPER GASTROINTESTINAL ENDOSCOPY  01/03/2018    Social History   Socioeconomic History  . Marital status: Single    Spouse name: Not on file  . Number of children: Not on file  . Years of education: Not on file  . Highest education level: Not on file  Occupational History  . Occupation: retired  Tobacco Use  . Smoking status: Former Smoker    Packs/day: 0.50    Years: 62.00    Pack years: 31.00    Types: Cigarettes    Quit date: 07/15/2019    Years since quitting: 0.1  . Smokeless tobacco: Never Used  Substance and Sexual Activity  . Alcohol use: Yes    Alcohol/week: 0.0 standard drinks    Comment: Social- 1 scotch, 1 glass wine weekly  . Drug use: No  . Sexual activity: Not Currently    Birth control/protection: Post-menopausal  Other Topics Concern  . 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   Social Determinants of Health   Financial Resource Strain: Low Risk   . Difficulty of Paying Living Expenses: Not hard at all  Food Insecurity: No Food Insecurity  . Worried About Charity fundraiser in the Last Year: Never true  . Ran Out of Food in the Last Year: Never true  Transportation Needs: No Transportation Needs  . Lack of Transportation (Medical): No  . Lack of Transportation (Non-Medical): No  Physical Activity: Sufficiently Active  . Days of Exercise per Week: 5 days  . Minutes of Exercise per Session: 30 min  Stress: No Stress Concern Present  . Feeling of Stress :  Not at all  Social Connections: Unknown  . Frequency of Communication with Friends and Family: More than three times a week  . Frequency of Social Gatherings with Friends and Family: More than three times a week  . Attends Religious Services: 1 to 4 times per year  . Active Member of Clubs or Organizations: Yes  . Attends Archivist Meetings: More than 4 times per year  . Marital Status: Not on file    Family History  Problem Relation Age of Onset  . Breast cancer Mother   .  Lung cancer Father   . Hypertension Brother   . Colon cancer Neg Hx   . Esophageal cancer Neg Hx   . Stomach cancer Neg Hx   . Rectal cancer Neg Hx     Review of Systems  Constitutional: Negative for fever.  Gastrointestinal: Negative for abdominal pain, constipation and nausea.  Genitourinary:       No urinary incontinence  Musculoskeletal: Positive for back pain.  Psychiatric/Behavioral: Negative for sleep disturbance.       Objective:   Vitals:   09/03/19 1341  BP: (!) 148/82  Pulse: 76  Resp: 16  Temp: 97.8 F (36.6 C)  SpO2: 99%   BP Readings from Last 3 Encounters:  09/03/19 (!) 148/82  08/04/19 (!) 162/96  07/18/19 129/84   Wt Readings from Last 3 Encounters:  08/04/19 88 lb (39.9 kg)  07/05/19 93 lb (42.2 kg)  07/02/19 93 lb (42.2 kg)   Body mass index is 15.11 kg/m.   Physical Exam    Constitutional: Appears thin and frail. No distress.  HENT:  Head: Normocephalic and atraumatic.  Neck: Neck supple. No tracheal deviation present. No thyromegaly present.  No cervical lymphadenopathy Cardiovascular: Normal rate, regular rhythm and normal heart sounds.   No murmur heard. No carotid bruit .  No edema Pulmonary/Chest: Effort normal and breath sounds normal. No respiratory distress. No has no wheezes. No rales.  Skin: Skin is warm and dry. Not diaphoretic.  Psychiatric: Normal mood and affect. Behavior is normal.      Assessment & Plan:    See Problem List for  Assessment and Plan of chronic medical problems.    This visit occurred during the SARS-CoV-2 public health emergency.  Safety protocols were in place, including screening questions prior to the visit, additional usage of staff PPE, and extensive cleaning of exam room while observing appropriate contact time as indicated for disinfecting solutions.

## 2019-09-03 ENCOUNTER — Ambulatory Visit (INDEPENDENT_AMBULATORY_CARE_PROVIDER_SITE_OTHER): Payer: Medicare Other | Admitting: Internal Medicine

## 2019-09-03 ENCOUNTER — Other Ambulatory Visit: Payer: Self-pay

## 2019-09-03 ENCOUNTER — Encounter: Payer: Self-pay | Admitting: Internal Medicine

## 2019-09-03 VITALS — BP 148/82 | HR 76 | Temp 97.8°F | Resp 16 | Ht 64.0 in

## 2019-09-03 DIAGNOSIS — M810 Age-related osteoporosis without current pathological fracture: Secondary | ICD-10-CM | POA: Diagnosis not present

## 2019-09-03 DIAGNOSIS — S32010D Wedge compression fracture of first lumbar vertebra, subsequent encounter for fracture with routine healing: Secondary | ICD-10-CM | POA: Diagnosis not present

## 2019-09-03 DIAGNOSIS — I10 Essential (primary) hypertension: Secondary | ICD-10-CM | POA: Diagnosis not present

## 2019-09-03 DIAGNOSIS — E440 Moderate protein-calorie malnutrition: Secondary | ICD-10-CM | POA: Diagnosis not present

## 2019-09-03 DIAGNOSIS — E46 Unspecified protein-calorie malnutrition: Secondary | ICD-10-CM | POA: Insufficient documentation

## 2019-09-03 DIAGNOSIS — M8448XA Pathological fracture, other site, initial encounter for fracture: Secondary | ICD-10-CM | POA: Diagnosis not present

## 2019-09-03 MED ORDER — HYDROCODONE-ACETAMINOPHEN 7.5-325 MG PO TABS: 1.0000 | ORAL_TABLET | Freq: Two times a day (BID) | ORAL | 0 refills | Status: DC | PRN

## 2019-09-03 NOTE — Assessment & Plan Note (Signed)
Causing chronic pain Will see Dr Ethelene Hal later this month for injection Taking low dose vicodin which does help Using walker, doing PT exercises at home

## 2019-09-03 NOTE — Assessment & Plan Note (Signed)
Chronic BP reasonably controlled Current regimen effective and well tolerated Continue current medications at current doses

## 2019-09-03 NOTE — Assessment & Plan Note (Signed)
Moderate protein-calorie malnutrition She has an eating d/o and does not want to gain weight Discussed she needs good nutrition and needs to gain weight to help her healing process and osteoporosis and overall health She will try to eat more and make sure she is getting a well balanced diet

## 2019-09-03 NOTE — Assessment & Plan Note (Signed)
S/p kypoplasty ? Back pain from this or sacral insuff fractures Will be seeing Dr Ethelene Hal for injection later this month Continue vicodin for pain - taking low dose and taking appropriately w/o side effects

## 2019-09-03 NOTE — Assessment & Plan Note (Signed)
Chronic Has had compression fractures On Prolia-last dose 08/04/2019-we will continue every 6 months Continue calcium and vitamin D Has seen orthopedics regarding her compression fxs

## 2019-09-11 DIAGNOSIS — M5136 Other intervertebral disc degeneration, lumbar region: Secondary | ICD-10-CM | POA: Diagnosis not present

## 2019-09-15 ENCOUNTER — Other Ambulatory Visit: Payer: Self-pay | Admitting: Internal Medicine

## 2019-09-29 ENCOUNTER — Inpatient Hospital Stay (HOSPITAL_COMMUNITY): Admission: RE | Admit: 2019-09-29 | Payer: Medicare Other | Source: Ambulatory Visit

## 2019-09-29 ENCOUNTER — Encounter (HOSPITAL_COMMUNITY): Payer: Medicare Other

## 2019-10-02 DIAGNOSIS — M5136 Other intervertebral disc degeneration, lumbar region: Secondary | ICD-10-CM | POA: Diagnosis not present

## 2019-10-02 DIAGNOSIS — M47816 Spondylosis without myelopathy or radiculopathy, lumbar region: Secondary | ICD-10-CM | POA: Diagnosis not present

## 2019-10-06 ENCOUNTER — Other Ambulatory Visit: Payer: Self-pay | Admitting: Internal Medicine

## 2019-10-07 ENCOUNTER — Telehealth: Payer: Self-pay | Admitting: Internal Medicine

## 2019-10-07 NOTE — Telephone Encounter (Signed)
Check [place] registry last filled 05/05/2019.Marland KitchenRaechel Chute

## 2019-10-07 NOTE — Telephone Encounter (Signed)
New Message:   Pt is calling and states that they are needing a prior authorization for her HYDROcodone-acetaminophen (NORCO) 7.5-325 MG tablet. Please advise.

## 2019-10-08 MED ORDER — HYDROCODONE-ACETAMINOPHEN 7.5-325 MG PO TABS: 1.0000 | ORAL_TABLET | Freq: Two times a day (BID) | ORAL | 0 refills | Status: DC | PRN

## 2019-10-08 NOTE — Telephone Encounter (Signed)
Sent to pof 

## 2019-10-08 NOTE — Telephone Encounter (Signed)
Patient is calling to see why RX has not been filled it. She states she is in a lot of pain. Patient informed we do have messages she requested and refill can take 24 to 72 hours to be filled.

## 2019-10-09 ENCOUNTER — Telehealth: Payer: Self-pay | Admitting: Internal Medicine

## 2019-10-09 ENCOUNTER — Encounter: Payer: Self-pay | Admitting: Internal Medicine

## 2019-10-09 MED ORDER — OMEPRAZOLE 40 MG PO CPDR: 40.0000 mg | DELAYED_RELEASE_CAPSULE | Freq: Every day | ORAL | 1 refills | Status: DC

## 2019-10-09 NOTE — Telephone Encounter (Signed)
error 

## 2019-10-09 NOTE — Telephone Encounter (Signed)
Omeprazole refilled.

## 2019-10-14 DIAGNOSIS — M47816 Spondylosis without myelopathy or radiculopathy, lumbar region: Secondary | ICD-10-CM | POA: Diagnosis not present

## 2019-10-15 ENCOUNTER — Ambulatory Visit (HOSPITAL_COMMUNITY): Payer: Medicare Other

## 2019-10-23 ENCOUNTER — Telehealth: Payer: Self-pay

## 2019-10-23 NOTE — Telephone Encounter (Signed)
Advise her to take 2000 units of vitamin D 3 otc daily

## 2019-10-23 NOTE — Telephone Encounter (Signed)
1.Medication Requested:Vitamin D, Ergocalciferol, (DRISDOL) 1.25 MG (50000 UNIT) CAPS capsule  2. Pharmacy (Name, Street, City):[facility] [phone] - [place], [place] - [address] AT [facility] [address]  3. On Med List: Yes   4. Last Visit with PCP: 5.12.21   5. Next visit date with PCP: 8.16.21    Agent: Please be advised that RX refills may take up to 3 business days. We ask that you follow-up with your pharmacy.

## 2019-10-24 NOTE — Telephone Encounter (Signed)
Spoke with patient.

## 2019-11-05 ENCOUNTER — Encounter: Payer: Medicare Other | Admitting: Internal Medicine

## 2019-11-13 ENCOUNTER — Encounter (HOSPITAL_COMMUNITY): Payer: Medicare Other

## 2019-11-17 ENCOUNTER — Other Ambulatory Visit: Payer: Self-pay | Admitting: Internal Medicine

## 2019-11-20 DIAGNOSIS — M47816 Spondylosis without myelopathy or radiculopathy, lumbar region: Secondary | ICD-10-CM | POA: Diagnosis not present

## 2019-11-23 ENCOUNTER — Other Ambulatory Visit: Payer: Self-pay | Admitting: Internal Medicine

## 2019-11-24 ENCOUNTER — Telehealth: Payer: Self-pay

## 2019-11-24 NOTE — Telephone Encounter (Signed)
Waiting for Dr. Lawerance Bach to fill out.

## 2019-11-24 NOTE — Telephone Encounter (Signed)
New message    The patient asking for a handicap sticker asking for a call back from the CMA to discuss her steps on obtained one.

## 2019-11-25 NOTE — Telephone Encounter (Signed)
Sticker paperwork ready for pick up.  LM for patient to let me know if she wants it mailed or if she going to come by and pick it up.

## 2019-11-25 NOTE — Telephone Encounter (Signed)
Mailed out to patient today 

## 2019-11-25 NOTE — Telephone Encounter (Signed)
    Patient requesting form be mailed to her. Address on file verified

## 2019-11-27 DIAGNOSIS — G894 Chronic pain syndrome: Secondary | ICD-10-CM | POA: Diagnosis not present

## 2019-11-27 DIAGNOSIS — M5136 Other intervertebral disc degeneration, lumbar region: Secondary | ICD-10-CM | POA: Diagnosis not present

## 2019-12-05 ENCOUNTER — Telehealth: Payer: Self-pay

## 2019-12-05 NOTE — Telephone Encounter (Signed)
Spoke with patient. We will do handicap sticker for her on Monday.

## 2019-12-05 NOTE — Telephone Encounter (Signed)
New message  Next appt on 8.16.21  Asking the MD to fill out another Handicap form since the first one has not arrived at her home yet.

## 2019-12-07 NOTE — Progress Notes (Signed)
Subjective:    Patient ID: Jennifer [name], female    DOB: 12/30/1940, 79 y.o.   MRN: [id number]  HPI She is here for a physical exam.   Her mobility has improved.  She can not stand for long periods of time.  She uses a walker for walking longer distances.  She uses the walker to help her get out of bed.    She is trying to be more active.  Her pain level in her back is about 5/10.  If she sits sometimes she can have no pain.  She can do most ADLs, except going to the grocery store because she can not stand long  Her dog died unexpectedly and she has had some anxiety and depression from that.   Medications and allergies reviewed with patient and updated if appropriate.  Patient Active Problem List   Diagnosis Date Noted  . Malnutrition (HCC) 09/03/2019  . Sacral insufficiency fracture 08/04/2019  . Vitamin D deficiency 08/04/2019  . Closed compression fracture of L1 vertebra (HCC) 08/01/2019  . Back pain 07/05/2019  . Lumbar radiculopathy 06/27/2019  . Lung nodule 06/10/2019  . Abdominal aortic aneurysm (AAA) (HCC) 06/10/2019  . Chest pain, musculoskeletal 06/10/2019  . Ascending aortic aneurysm (HCC) 06/10/2019  . Hiatal hernia, large 01/09/2018  . GERD (gastroesophageal reflux disease) 12/08/2016  . History of Clostridium difficile infection 11/29/2016  . Prediabetes 11/02/2016  . Carotid arterial disease (HCC) 10/20/2015  . Anxiety state   . PAD (peripheral artery disease) (HCC) 10/10/2011  . Osteoporosis, senile 05/17/2011  . Dyslipidemia 05/03/2009  . Tobacco abuse 04/14/2009  . Essential hypertension 04/14/2009    Current Outpatient Medications on File Prior to Visit  Medication Sig Dispense Refill  . ALPRAZolam (XANAX) 0.25 MG tablet TAKE 1 TABLET BY MOUTH EVERY NIGHT AT BEDTIME AS NEEDED FOR ANXIETY OR SLEEP 30 tablet 0  . amLODipine (NORVASC) 2.5 MG tablet Take 1 tablet (2.5 mg total) by mouth daily. 90 tablet 1  . aspirin 81 MG tablet Take 81 mg by mouth  daily before breakfast.     . atorvastatin (LIPITOR) 20 MG tablet TAKE 1 TABLET BY MOUTH EVERY DAY 90 tablet 1  . calcium-vitamin D (OSCAL WITH D) 500-200 MG-UNIT per tablet Take 2 tablets by mouth daily.    . cilostazol (PLETAL) 50 MG tablet Take 1 tablet (50 mg total) by mouth 2 (two) times daily. 180 tablet 1  . omeprazole (PRILOSEC) 40 MG capsule Take 1 capsule (40 mg total) by mouth daily. 90 capsule 1  . potassium chloride SA (K-DUR) 20 MEQ tablet TAKE 1 TABLET(20 MEQ) BY MOUTH DAILY (Patient taking differently: Take 20 mEq by mouth daily. ) 90 tablet 3   No current facility-administered medications on file prior to visit.    Past Medical History:  Diagnosis Date  . ANEMIA-NOS   . Cataract    right cataract removed  . Clotting disorder (HCC)    Pletal  . Depression    no longer taking meds/ resolved  . DYSLIPIDEMIA   . GERD (gastroesophageal reflux disease)   . Hemorrhoids   . HYPERTENSION   . Negative colorectal cancer screening using Cologuard test   . Osteoporosis, senile    DEXA 03/2014: -3.3 R fem  . Peripheral vascular disease, unspecified (HCC) dx 02/2010 ABI  . SMOKER     Past Surgical History:  Procedure Laterality Date  . CATARACT EXTRACTION W/ INTRAOCULAR LENS IMPLANT Right 03/12/14   groat  . HEMORRHOID SURGERY  05/18/2011   Procedure: HEMORRHOIDECTOMY;  Surgeon: Robyne Askew, MD;  Location: [facility];  Service: General;  Laterality: N/A;  hemorrhoidectomy, three columns, internal and external  . IR KYPHO LUMBAR INC FX REDUCE BONE BX UNI/BIL CANNULATION INC/IMAGING  07/11/2019  . TONSILLECTOMY  1946  . TUBAL LIGATION  1979  . UPPER GASTROINTESTINAL ENDOSCOPY  01/03/2018    Social History   Socioeconomic History  . Marital status: Single    Spouse name: Not on file  . Number of children: Not on file  . Years of education: Not on file  . Highest education level: Not on file  Occupational History  . Occupation: retired  Tobacco Use  . Smoking status:  Former Smoker    Packs/day: 0.50    Years: 62.00    Pack years: 31.00    Types: Cigarettes    Quit date: 07/15/2019    Years since quitting: 0.4  . Smokeless tobacco: Never Used  Vaping Use  . Vaping Use: Never used  Substance and Sexual Activity  . Alcohol use: Yes    Alcohol/week: 0.0 standard drinks    Comment: Social- 1 scotch, 1 glass wine weekly  . Drug use: No  . Sexual activity: Not Currently    Birth control/protection: Post-menopausal  Other Topics Concern  . Not on file  Social History Narrative   Single, lives alone. Retired Pensions consultant. Enjoys Agricultural consultant work.   Social Determinants of Health   Financial Resource Strain:   . Difficulty of Paying Living Expenses:   Food Insecurity:   . Worried About Programme researcher, broadcasting/film/video in the Last Year:   . Barista in the Last Year:   Transportation Needs:   . Freight forwarder (Medical):   Marland Kitchen Lack of Transportation (Non-Medical):   Physical Activity:   . Days of Exercise per Week:   . Minutes of Exercise per Session:   Stress:   . Feeling of Stress :   Social Connections:   . Frequency of Communication with Friends and Family:   . Frequency of Social Gatherings with Friends and Family:   . Attends Religious Services:   . Active Member of Clubs or Organizations:   . Attends Banker Meetings:   Marland Kitchen Marital Status:     Family History  Problem Relation Age of Onset  . Breast cancer Mother   . Lung cancer Father   . Hypertension Brother   . Colon cancer Neg Hx   . Esophageal cancer Neg Hx   . Stomach cancer Neg Hx   . Rectal cancer Neg Hx     Review of Systems  Constitutional: Negative for chills and fever.  Eyes: Negative for visual disturbance.  Respiratory: Negative for cough, shortness of breath and wheezing.   Cardiovascular: Negative for chest pain, palpitations and leg swelling.  Gastrointestinal: Negative for abdominal pain, blood in stool, constipation, diarrhea and nausea.    Genitourinary: Negative for dysuria and hematuria.  Musculoskeletal: Positive for back pain. Negative for arthralgias.  Skin: Negative for rash.  Neurological: Negative for light-headedness and headaches.  Psychiatric/Behavioral: Negative for dysphoric mood. The patient is nervous/anxious.        Objective:   Vitals:   12/08/19 1521  BP: (!) 142/74  Pulse: 80  Temp: 98 F (36.7 C)  SpO2: 96%   Filed Weights   12/08/19 1521  Weight: 85 lb 9.6 oz (38.8 kg)   Body mass index is 14.69 kg/m.  BP Readings from  Last 3 Encounters:  12/08/19 (!) 142/74  09/03/19 (!) 148/82  08/04/19 (!) 162/96    Wt Readings from Last 3 Encounters:  12/08/19 85 lb 9.6 oz (38.8 kg)  08/04/19 88 lb (39.9 kg)  07/05/19 93 lb (42.2 kg)     Physical Exam Constitutional: frail elderly female. No distress.  HENT:  Head: Normocephalic and atraumatic.  Right Ear: External ear normal. Normal ear canal and TM Left Ear: External ear normal.  Normal ear canal and TM Mouth/Throat: Oropharynx is clear and moist.  Eyes: Conjunctivae and EOM are normal.  Neck: Neck supple. No tracheal deviation present. No thyromegaly present.  No carotid bruit  Cardiovascular: Normal rate, regular rhythm and normal heart sounds.   No murmur heard.  No edema. Pulmonary/Chest: Effort normal and breath sounds normal. No respiratory distress. She has no wheezes. She has no rales.  Breast: deferred   Abdominal: Soft. She exhibits no distension. There is no tenderness.  Lymphadenopathy: She has no cervical adenopathy.  Skin: Skin is warm and dry. She is not diaphoretic.  Psychiatric: She has a normal mood and affect. Her behavior is normal.        Assessment & Plan:   Physical exam: Screening blood work    ordered Immunizations    Up to date Colonoscopy n/a due to age Mammogram  N/a due to age 50  n/a Dexa  Up to date  Eye exams due - will schedule Exercise  More active Weight  On low side - encouraged  eating more and working on weight gain Substance abuse  none  See Problem List for Assessment and Plan of chronic medical problems.   This visit occurred during the SARS-CoV-2 public health emergency.  Safety protocols were in place, including screening questions prior to the visit, additional usage of staff PPE, and extensive cleaning of exam room while observing appropriate contact time as indicated for disinfecting solutions.

## 2019-12-07 NOTE — Patient Instructions (Addendum)
Blood work was ordered.     Medications reviewed and updated.  Changes include :   none   Please followup in 6 months     Health Maintenance, Female Adopting a healthy lifestyle and getting preventive care are important in promoting health and wellness. Ask your health care provider about:  The right schedule for you to have regular tests and exams.  Things you can do on your own to prevent diseases and keep yourself healthy. What should I know about diet, weight, and exercise? Eat a healthy diet   Eat a diet that includes plenty of vegetables, fruits, low-fat dairy products, and lean protein.  Do not eat a lot of foods that are high in solid fats, added sugars, or sodium. Maintain a healthy weight Body mass index (BMI) is used to identify weight problems. It estimates body fat based on height and weight. Your health care provider can help determine your BMI and help you achieve or maintain a healthy weight. Get regular exercise Get regular exercise. This is one of the most important things you can do for your health. Most adults should:  Exercise for at least 150 minutes each week. The exercise should increase your heart rate and make you sweat (moderate-intensity exercise).  Do strengthening exercises at least twice a week. This is in addition to the moderate-intensity exercise.  Spend less time sitting. Even light physical activity can be beneficial. Watch cholesterol and blood lipids Have your blood tested for lipids and cholesterol at 81 years of age, then have this test every 5 years. Have your cholesterol levels checked more often if:  Your lipid or cholesterol levels are high.  You are older than 81 years of age.  You are at high risk for heart disease. What should I know about cancer screening? Depending on your health history and family history, you may need to have cancer screening at various ages. This may include screening for:  Breast cancer.  Cervical  cancer.  Colorectal cancer.  Skin cancer.  Lung cancer. What should I know about heart disease, diabetes, and high blood pressure? Blood pressure and heart disease  High blood pressure causes heart disease and increases the risk of stroke. This is more likely to develop in people who have high blood pressure readings, are of African descent, or are overweight.  Have your blood pressure checked: ? Every 3-5 years if you are 74-14 years of age. ? Every year if you are 46 years old or older. Diabetes Have regular diabetes screenings. This checks your fasting blood sugar level. Have the screening done:  Once every three years after age 75 if you are at a normal weight and have a low risk for diabetes.  More often and at a younger age if you are overweight or have a high risk for diabetes. What should I know about preventing infection? Hepatitis B If you have a higher risk for hepatitis B, you should be screened for this virus. Talk with your health care provider to find out if you are at risk for hepatitis B infection. Hepatitis C Testing is recommended for:  Everyone born from [birth year] through 1967.  Anyone with known risk factors for hepatitis C. Sexually transmitted infections (STIs)  Get screened for STIs, including gonorrhea and chlamydia, if: ? You are sexually active and are younger than 81 years of age. ? You are older than 81 years of age and your health care provider tells you that you are at risk for this  type of infection. ? Your sexual activity has changed since you were last screened, and you are at increased risk for chlamydia or gonorrhea. Ask your health care provider if you are at risk.  Ask your health care provider about whether you are at high risk for HIV. Your health care provider may recommend a prescription medicine to help prevent HIV infection. If you choose to take medicine to prevent HIV, you should first get tested for HIV. You should then be tested every 3  months for as long as you are taking the medicine. Pregnancy  If you are about to stop having your period (premenopausal) and you may become pregnant, seek counseling before you get pregnant.  Take 400 to 800 micrograms (mcg) of folic acid every day if you become pregnant.  Ask for birth control (contraception) if you want to prevent pregnancy. Osteoporosis and menopause Osteoporosis is a disease in which the bones lose minerals and strength with aging. This can result in bone fractures. If you are 70 years old or older, or if you are at risk for osteoporosis and fractures, ask your health care provider if you should:  Be screened for bone loss.  Take a calcium or vitamin D supplement to lower your risk of fractures.  Be given hormone replacement therapy (HRT) to treat symptoms of menopause. Follow these instructions at home: Lifestyle  Do not use any products that contain nicotine or tobacco, such as cigarettes, e-cigarettes, and chewing tobacco. If you need help quitting, ask your health care provider.  Do not use street drugs.  Do not share needles.  Ask your health care provider for help if you need support or information about quitting drugs. Alcohol use  Do not drink alcohol if: ? Your health care provider tells you not to drink. ? You are pregnant, may be pregnant, or are planning to become pregnant.  If you drink alcohol: ? Limit how much you use to 0-1 drink a day. ? Limit intake if you are breastfeeding.  Be aware of how much alcohol is in your drink. In the [place] one drink equals one 12 oz bottle of beer (355 mL), one 5 oz glass of wine (148 mL), or one 1 oz glass of hard liquor (44 mL). General instructions  Schedule regular health, dental, and eye exams.  Stay current with your vaccines.  Tell your health care provider if: ? You often feel depressed. ? You have ever been abused or do not feel safe at home. Summary  Adopting a healthy lifestyle and getting  preventive care are important in promoting health and wellness.  Follow your health care provider's instructions about healthy diet, exercising, and getting tested or screened for diseases.  Follow your health care provider's instructions on monitoring your cholesterol and blood pressure. This information is not intended to replace advice given to you by your health care provider. Make sure you discuss any questions you have with your health care provider. Document Revised: 04/03/2018 Document Reviewed: 04/03/2018 Elsevier Patient Education  2020 [employer].

## 2019-12-08 ENCOUNTER — Other Ambulatory Visit: Payer: Self-pay

## 2019-12-08 ENCOUNTER — Encounter: Payer: Self-pay | Admitting: Internal Medicine

## 2019-12-08 ENCOUNTER — Ambulatory Visit (INDEPENDENT_AMBULATORY_CARE_PROVIDER_SITE_OTHER): Payer: Medicare Other | Admitting: Internal Medicine

## 2019-12-08 VITALS — BP 142/74 | HR 80 | Temp 98.0°F | Ht 64.0 in | Wt 85.6 lb

## 2019-12-08 DIAGNOSIS — K219 Gastro-esophageal reflux disease without esophagitis: Secondary | ICD-10-CM | POA: Diagnosis not present

## 2019-12-08 DIAGNOSIS — F411 Generalized anxiety disorder: Secondary | ICD-10-CM

## 2019-12-08 DIAGNOSIS — Z0000 Encounter for general adult medical examination without abnormal findings: Secondary | ICD-10-CM

## 2019-12-08 DIAGNOSIS — I10 Essential (primary) hypertension: Secondary | ICD-10-CM | POA: Diagnosis not present

## 2019-12-08 DIAGNOSIS — M810 Age-related osteoporosis without current pathological fracture: Secondary | ICD-10-CM

## 2019-12-08 DIAGNOSIS — E785 Hyperlipidemia, unspecified: Secondary | ICD-10-CM | POA: Diagnosis not present

## 2019-12-08 DIAGNOSIS — R7303 Prediabetes: Secondary | ICD-10-CM

## 2019-12-08 DIAGNOSIS — E559 Vitamin D deficiency, unspecified: Secondary | ICD-10-CM

## 2019-12-08 DIAGNOSIS — I739 Peripheral vascular disease, unspecified: Secondary | ICD-10-CM

## 2019-12-08 NOTE — Assessment & Plan Note (Signed)
Chronic Taking vitamin d daily Check level 

## 2019-12-08 NOTE — Assessment & Plan Note (Signed)
Chronic GERD controlled Continue daily medication Omeprazole 40 mg daily 

## 2019-12-08 NOTE — Assessment & Plan Note (Signed)
Chronic Has had compression fx On prolia - next dose due 10/21 Taking calcium and vitamin d daily

## 2019-12-08 NOTE — Assessment & Plan Note (Signed)
Chronic Check lipid panel  Continue daily statin Regular exercise and healthy diet encouraged  

## 2019-12-08 NOTE — Assessment & Plan Note (Signed)
Chronic No claudication symptoms Continue pletal

## 2019-12-08 NOTE — Assessment & Plan Note (Signed)
Chronic BP well controlled Current regimen effective and well tolerated Continue current medications at current doses cmp  

## 2019-12-08 NOTE — Assessment & Plan Note (Signed)
Chronic Check a1c Low sugar / carb diet Stressed regular exercise  

## 2019-12-08 NOTE — Assessment & Plan Note (Signed)
Chronic Controlled, stable Continue current dose of medication Xanax prn 

## 2019-12-09 ENCOUNTER — Other Ambulatory Visit (INDEPENDENT_AMBULATORY_CARE_PROVIDER_SITE_OTHER): Payer: Medicare Other

## 2019-12-09 DIAGNOSIS — E785 Hyperlipidemia, unspecified: Secondary | ICD-10-CM | POA: Diagnosis not present

## 2019-12-09 DIAGNOSIS — R7303 Prediabetes: Secondary | ICD-10-CM

## 2019-12-09 DIAGNOSIS — E559 Vitamin D deficiency, unspecified: Secondary | ICD-10-CM | POA: Diagnosis not present

## 2019-12-09 DIAGNOSIS — Z0000 Encounter for general adult medical examination without abnormal findings: Secondary | ICD-10-CM | POA: Diagnosis not present

## 2019-12-09 DIAGNOSIS — M810 Age-related osteoporosis without current pathological fracture: Secondary | ICD-10-CM | POA: Diagnosis not present

## 2019-12-09 LAB — COMPREHENSIVE METABOLIC PANEL
ALT: 6 U/L (ref 0–35)
AST: 14 U/L (ref 0–37)
Albumin: 3.8 g/dL (ref 3.5–5.2)
Alkaline Phosphatase: 58 U/L (ref 39–117)
BUN: 22 mg/dL (ref 6–23)
CO2: 27 mEq/L (ref 19–32)
Calcium: 9.4 mg/dL (ref 8.4–10.5)
Chloride: 101 mEq/L (ref 96–112)
Creatinine, Ser: 0.89 mg/dL (ref 0.40–1.20)
GFR: 60.83 mL/min (ref >60.00)
Glucose, Bld: 101 mg/dL — ABNORMAL HIGH (ref 70–99)
Potassium: 3.7 mEq/L (ref 3.5–5.1)
Sodium: 137 mEq/L (ref 135–145)
Total Bilirubin: 0.3 mg/dL (ref 0.2–1.2)
Total Protein: 7.4 g/dL (ref 6.0–8.3)

## 2019-12-09 LAB — CBC WITH DIFFERENTIAL/PLATELET
Basophils Absolute: 0.1 10*3/uL (ref 0.0–0.1)
Basophils Relative: 1.2 % (ref 0.0–3.0)
Eosinophils Absolute: 0.1 10*3/uL (ref 0.0–0.7)
Eosinophils Relative: 0.8 % (ref 0.0–5.0)
HCT: 32.3 % — ABNORMAL LOW (ref 36.0–46.0)
Hemoglobin: 10.7 g/dL — ABNORMAL LOW (ref 12.0–15.0)
Lymphocytes Relative: 32.9 % (ref 12.0–46.0)
Lymphs Abs: 2.8 10*3/uL (ref 0.7–4.0)
MCHC: 33.2 g/dL (ref 30.0–36.0)
MCV: 84 fl (ref 78.0–100.0)
Monocytes Absolute: 0.7 10*3/uL (ref 0.1–1.0)
Monocytes Relative: 8.7 % (ref 3.0–12.0)
Neutro Abs: 4.8 10*3/uL (ref 1.4–7.7)
Neutrophils Relative %: 56.4 % (ref 43.0–77.0)
Platelets: 230 10*3/uL (ref 150.0–400.0)
RBC: 3.85 Mil/uL — ABNORMAL LOW (ref 3.87–5.11)
RDW: 15.9 % — ABNORMAL HIGH (ref 11.5–15.5)
WBC: 8.5 10*3/uL (ref 4.0–10.5)

## 2019-12-09 LAB — LIPID PANEL
Cholesterol: 178 mg/dL (ref 0–200)
HDL: 74.3 mg/dL (ref >39.00)
LDL Cholesterol: 74 mg/dL (ref 0–99)
NonHDL: 103.71
Total CHOL/HDL Ratio: 2
Triglycerides: 149 mg/dL (ref 0.0–149.0)
VLDL: 29.8 mg/dL (ref 0.0–40.0)

## 2019-12-09 LAB — VITAMIN D 25 HYDROXY (VIT D DEFICIENCY, FRACTURES): VITD: 32.64 ng/mL (ref 30.00–100.00)

## 2019-12-09 LAB — HEMOGLOBIN A1C: Hgb A1c MFr Bld: 5.9 % (ref 4.6–6.5)

## 2019-12-17 ENCOUNTER — Ambulatory Visit (HOSPITAL_COMMUNITY)
Admission: RE | Admit: 2019-12-17 | Discharge: 2019-12-17 | Disposition: A | Discharge status: Home / Self Care | Payer: Medicare Other | Source: Ambulatory Visit | Attending: Internal Medicine | Admitting: Internal Medicine

## 2019-12-17 ENCOUNTER — Other Ambulatory Visit (HOSPITAL_COMMUNITY): Payer: Self-pay | Admitting: Cardiovascular Disease

## 2019-12-17 ENCOUNTER — Other Ambulatory Visit: Payer: Self-pay

## 2019-12-17 ENCOUNTER — Ambulatory Visit (HOSPITAL_BASED_OUTPATIENT_CLINIC_OR_DEPARTMENT_OTHER)
Admission: RE | Admit: 2019-12-17 | Discharge: 2019-12-17 | Disposition: A | Discharge status: Home / Self Care | Payer: Medicare Other | Source: Ambulatory Visit | Attending: Cardiovascular Disease | Admitting: Cardiovascular Disease

## 2019-12-17 DIAGNOSIS — I351 Nonrheumatic aortic (valve) insufficiency: Secondary | ICD-10-CM

## 2019-12-17 DIAGNOSIS — I6523 Occlusion and stenosis of bilateral carotid arteries: Secondary | ICD-10-CM

## 2019-12-17 DIAGNOSIS — I739 Peripheral vascular disease, unspecified: Secondary | ICD-10-CM | POA: Insufficient documentation

## 2020-01-15 ENCOUNTER — Other Ambulatory Visit: Payer: Self-pay | Admitting: Internal Medicine

## 2020-01-20 ENCOUNTER — Other Ambulatory Visit: Payer: Self-pay | Admitting: Internal Medicine

## 2020-01-20 NOTE — Telephone Encounter (Signed)
Check [place] registry last filled 11/24/2019.Marland KitchenRaechel [name]

## 2020-02-02 ENCOUNTER — Other Ambulatory Visit: Payer: Self-pay | Admitting: Internal Medicine

## 2020-03-03 ENCOUNTER — Encounter: Payer: Self-pay | Admitting: Cardiovascular Disease

## 2020-03-03 ENCOUNTER — Other Ambulatory Visit: Payer: Self-pay

## 2020-03-03 ENCOUNTER — Ambulatory Visit: Payer: Medicare Other | Admitting: Cardiovascular Disease

## 2020-03-03 VITALS — BP 144/70 | HR 86 | Ht 64.0 in | Wt 91.8 lb

## 2020-03-03 DIAGNOSIS — I6523 Occlusion and stenosis of bilateral carotid arteries: Secondary | ICD-10-CM | POA: Diagnosis not present

## 2020-03-03 DIAGNOSIS — I714 Abdominal aortic aneurysm, without rupture, unspecified: Secondary | ICD-10-CM

## 2020-03-03 DIAGNOSIS — I739 Peripheral vascular disease, unspecified: Secondary | ICD-10-CM | POA: Diagnosis not present

## 2020-03-03 DIAGNOSIS — I10 Essential (primary) hypertension: Secondary | ICD-10-CM | POA: Diagnosis not present

## 2020-03-03 DIAGNOSIS — I351 Nonrheumatic aortic (valve) insufficiency: Secondary | ICD-10-CM | POA: Diagnosis not present

## 2020-03-03 MED ORDER — AMLODIPINE BESYLATE 5 MG PO TABS: 5.0000 mg | ORAL_TABLET | Freq: Every day | ORAL | 3 refills | Status: DC

## 2020-03-03 NOTE — Patient Instructions (Signed)
Medication Instructions:  Your physician has recommended you make the following change in your medication:  1.) INCREASE NORVASC TO 5 MG DAILY  *If you need a refill on your cardiac medications before your next appointment, please call your pharmacy*   Lab Work: none If you have labs (blood work) drawn today and your tests are completely normal, you will receive your results only by: Marland Kitchen MyChart Message (if you have MyChart) OR . A paper copy in the mail If you have any lab test that is abnormal or we need to change your treatment, we will call you to review the results.   Testing/Procedures:  CAROTIDS AND ABI'S DUE AUGUST 2022 Your physician has requested that you have a carotid duplex. This test is an ultrasound of the carotid arteries in your neck. It looks at blood flow through these arteries that supply the brain with blood. Allow one hour for this exam. There are no restrictions or special instructions.  Your physician has requested that you have an ankle brachial index (ABI). During this test an ultrasound and blood pressure cuff are used to evaluate the arteries that supply the arms and legs with blood. Allow thirty minutes for this exam. There are no restrictions or special instructions.   Follow-Up: At [facility], you and your health needs are our priority.  As part of our continuing mission to provide you with exceptional heart care, we have created designated Provider Care Teams.  These Care Teams include your primary Cardiologist (physician) and Advanced Practice Providers (APPs -  Physician Assistants and Nurse Practitioners) who all work together to provide you with the care you need, when you need it.   Your next appointment:   12 month(s)  The format for your next appointment:   In Person  Provider:   Verne Carrow, MD   Other Instructions

## 2020-03-03 NOTE — Progress Notes (Signed)
Chief Complaint  Patient presents with  . Follow-up    PAD   History of Present Illness: 79 yo female with history of AAA, PAD, tobacco abuse, HTN, aortic valve insufficiency and hyperlipidemia who is here today for PV and cardiac follow up. I saw her as a new pt in November 2011 for evaluation of PAD. Initial non-invasive studies showed chronic total occlusion of the left SFA with a long segment of total occlusion, reconstitution above the knee. There is moderate disease in the right SFA. Her initial ABI in 2012 was moderately reduced at 0.76 on the right and 0.60 on the left. I recommended that we pursue a diagnostic angiogram if she wished but she refused. Her PAD has been stable over the past 10 years. Most recent non-invasive studies August 2021 with right ABI 0.90 and left ABI 0.87. Carotid artery dopplers August 2021 with  with 40-59% LICA, 0-39% RICA stenosis, unchanged. She has been on ASA and Pletal. Echo September 2018 with normal LV systolic function, LVEF=55-60%. Grade 1 diastolic dysfunction. Mild AI. She was found to have a AAA at 5.4 cm in February 2021. This has been followed by Dr. Edilia Bo with [facility]. She is not felt to be a good candidate for endovascular repair or open surgery.   She is here today for follow up. The patient denies any chest pain, dyspnea, palpitations, lower extremity edema, orthopnea, PND, dizziness, near syncope or syncope. Mostly limited by back pain. Still smoking.     Primary Care Physician: Pincus Sanes, MD  Past Medical History:  Diagnosis Date  . ANEMIA-NOS   . Cataract    right cataract removed  . Clotting disorder (HCC)    Pletal  . Depression    no longer taking meds/ resolved  . DYSLIPIDEMIA   . GERD (gastroesophageal reflux disease)   . Hemorrhoids   . HYPERTENSION   . Negative colorectal cancer screening using Cologuard test   . Osteoporosis, senile    DEXA 03/2014: -3.3 R fem  . Peripheral vascular disease, unspecified (HCC) dx  02/2010 ABI  . SMOKER     Past Surgical History:  Procedure Laterality Date  . CATARACT EXTRACTION W/ INTRAOCULAR LENS IMPLANT Right 03/12/14   groat  . HEMORRHOID SURGERY  05/18/2011   Procedure: HEMORRHOIDECTOMY;  Surgeon: Robyne Askew, MD;  Location: [facility];  Service: General;  Laterality: N/A;  hemorrhoidectomy, three columns, internal and external  . IR KYPHO LUMBAR INC FX REDUCE BONE BX UNI/BIL CANNULATION INC/IMAGING  07/11/2019  . TONSILLECTOMY  1946  . TUBAL LIGATION  1979  . UPPER GASTROINTESTINAL ENDOSCOPY  01/03/2018    Current Outpatient Medications  Medication Sig Dispense Refill  . ALPRAZolam (XANAX) 0.25 MG tablet TAKE 1 TABLET BY MOUTH EVERY NIGHT AT BEDTIME AS NEEDED FOR ANXIETY OR SLEEP 30 tablet 5  . aspirin 81 MG tablet Take 81 mg by mouth daily before breakfast.     . atorvastatin (LIPITOR) 20 MG tablet TAKE 1 TABLET BY MOUTH EVERY DAY 90 tablet 1  . calcium-vitamin D (OSCAL WITH D) 500-200 MG-UNIT per tablet Take 2 tablets by mouth daily.    . cilostazol (PLETAL) 50 MG tablet TAKE 1 TABLET(50 MG) BY MOUTH TWICE DAILY 180 tablet 2  . omeprazole (PRILOSEC) 40 MG capsule Take 1 capsule (40 mg total) by mouth daily. 90 capsule 1  . potassium chloride SA (KLOR-CON) 20 MEQ tablet TAKE 1 TABLET(20 MEQ) BY MOUTH DAILY 90 tablet 3  . amLODipine (NORVASC) 5  MG tablet Take 1 tablet (5 mg total) by mouth daily. 180 tablet 3   No current facility-administered medications for this visit.    No Known Allergies  Social History   Socioeconomic History  . Marital status: Single    Spouse name: Not on file  . Number of children: Not on file  . Years of education: Not on file  . Highest education level: Not on file  Occupational History  . Occupation: retired  Tobacco Use  . Smoking status: Former Smoker    Packs/day: 0.50    Years: 62.00    Pack years: 31.00    Types: Cigarettes    Quit date: 07/15/2019    Years since quitting: 0.6  . Smokeless tobacco: Never  Used  Vaping Use  . Vaping Use: Never used  Substance and Sexual Activity  . Alcohol use: Yes    Alcohol/week: 0.0 standard drinks    Comment: Social- 1 scotch, 1 glass wine weekly  . Drug use: No  . Sexual activity: Not Currently    Birth control/protection: Post-menopausal  Other Topics Concern  . Not on file  Social History Narrative   Single, lives alone. Retired Pensions consultant. Enjoys Agricultural consultant work.   Social Determinants of Health   Financial Resource Strain:   . Difficulty of Paying Living Expenses: Not on file  Food Insecurity:   . Worried About Programme researcher, broadcasting/film/video in the Last Year: Not on file  . Ran Out of Food in the Last Year: Not on file  Transportation Needs:   . Lack of Transportation (Medical): Not on file  . Lack of Transportation (Non-Medical): Not on file  Physical Activity:   . Days of Exercise per Week: Not on file  . Minutes of Exercise per Session: Not on file  Stress:   . Feeling of Stress : Not on file  Social Connections:   . Frequency of Communication with Friends and Family: Not on file  . Frequency of Social Gatherings with Friends and Family: Not on file  . Attends Religious Services: Not on file  . Active Member of Clubs or Organizations: Not on file  . Attends Banker Meetings: Not on file  . Marital Status: Not on file  Intimate Partner Violence:   . Fear of Current or Ex-Partner: Not on file  . Emotionally Abused: Not on file  . Physically Abused: Not on file  . Sexually Abused: Not on file    Family History  Problem Relation Age of Onset  . Breast cancer Mother   . Lung cancer Father   . Hypertension Brother   . Colon cancer Neg Hx   . Esophageal cancer Neg Hx   . Stomach cancer Neg Hx   . Rectal cancer Neg Hx     Review of Systems:  As stated in the HPI and otherwise negative.   BP (!) 144/70   Pulse 86   Ht 5\' 4"  (1.626 m)   Wt 91 lb 12.8 oz (41.6 kg)   SpO2 97%   BMI 15.76 kg/m   Physical  Examination:  General: Well developed, well nourished, NAD  HEENT: OP clear, mucus membranes moist  SKIN: warm, dry. No rashes. Neuro: No focal deficits  Musculoskeletal: Muscle strength 5/5 all ext  Psychiatric: Mood and affect normal  Neck: No JVD, no carotid bruits, no thyromegaly, no lymphadenopathy.  Lungs:Clear bilaterally, no wheezes, rhonci, crackles Cardiovascular: Regular rate and rhythm. No murmurs, gallops or rubs. Abdomen:Soft. Bowel sounds present.  Non-tender.  Extremities: No lower extremity edema. Pulses are trace in the bilateral DP/PT.   Echo September 2018: Left ventricle: There was mild focal basal hypertrophy of the   septum. Systolic function was normal. The estimated ejection   fraction was in the range of 55% to 60%. Doppler parameters are   consistent with abnormal left ventricular relaxation (grade 1   diastolic dysfunction). - Aortic valve: There was mild regurgitation.   EKG:  EKG is not ordered today. The ekg ordered today demonstrates   Recent Labs: 12/09/2019: ALT 6; BUN 22; Creatinine, Ser 0.89; Hemoglobin 10.7 Repeated and verified X2.; Platelets 230.0; Potassium 3.7; Sodium 137   Lipid Panel    Component Value Date/Time   CHOL 178 12/09/2019 1433   TRIG 149.0 12/09/2019 1433   HDL 74.30 12/09/2019 1433   CHOLHDL 2 12/09/2019 1433   VLDL 29.8 12/09/2019 1433   LDLCALC 74 12/09/2019 1433   LDLDIRECT 232.1 08/09/2009 1125     Wt Readings from Last 3 Encounters:  03/03/20 91 lb 12.8 oz (41.6 kg)  12/08/19 85 lb 9.6 oz (38.8 kg)  08/04/19 88 lb (39.9 kg)     Other studies Reviewed: Additional studies/ records that were reviewed today include: . Review of the above records demonstrates:    Assessment and Plan:   1. PAD:  ABI overall stable by dopplers August 2021. No change in claudication symptoms. Will continue to follow conservatively unless she has a change in her symptoms. Continue ASA and Pletal. Repeat ABI August 2022.   2.  Carotid artery disease: Stable moderate LICA stenosis and mild RICA stenosis by dopplers August 2021. Repeat August 2022.    3. Tobacco abuse: Tobacco cessation advised.   4. Aortic valve insufficiency: Mild AI by echo September 2018.  No loud murmur on exam. Will not repeat echo this year. Consider repeating echo next year.   5. YCX:KGYJEHUD in [facility]. Not a good candidate for open repair or fenestrated graft.   6. HTN: BP has been elevated at home. Increase Norvasc to 5 mg daily  Current medicines are reviewed at length with the patient today.  The patient does not have concerns regarding medicines.  The following changes have been made:  no change  Labs/ tests ordered today include:   Orders Placed This Encounter  Procedures  . VAS US CAROTID  . VAS [place] ABI WITH/WO TBI    Disposition:   FU with me in 12  months  Signed, Verne Carrow, MD 03/04/2020 7:20 AM    [facility] [address] [place], [place], [place]  [address] Phone: [phone]; Fax: [phone]

## 2020-03-24 ENCOUNTER — Ambulatory Visit (INDEPENDENT_AMBULATORY_CARE_PROVIDER_SITE_OTHER): Payer: Medicare Other

## 2020-03-24 ENCOUNTER — Other Ambulatory Visit: Payer: Self-pay

## 2020-03-24 DIAGNOSIS — M810 Age-related osteoporosis without current pathological fracture: Secondary | ICD-10-CM

## 2020-03-24 MED ORDER — DENOSUMAB 60 MG/ML ~~LOC~~ SOSY
60.0000 mg | PREFILLED_SYRINGE | Freq: Once | SUBCUTANEOUS | Status: AC
  Administered 2020-03-24: 60 mg via SUBCUTANEOUS

## 2020-03-24 NOTE — Progress Notes (Signed)
Prolia 60mg  given subcutaneous per Dr . Given in right arm, pt tolerated well.

## 2020-04-01 ENCOUNTER — Encounter: Payer: Self-pay | Admitting: Internal Medicine

## 2020-04-01 ENCOUNTER — Ambulatory Visit (INDEPENDENT_AMBULATORY_CARE_PROVIDER_SITE_OTHER): Payer: Medicare Other | Admitting: Internal Medicine

## 2020-04-01 VITALS — BP 140/70 | HR 79 | Ht 64.0 in | Wt 89.6 lb

## 2020-04-01 DIAGNOSIS — K219 Gastro-esophageal reflux disease without esophagitis: Secondary | ICD-10-CM | POA: Diagnosis not present

## 2020-04-01 DIAGNOSIS — K222 Esophageal obstruction: Secondary | ICD-10-CM | POA: Diagnosis not present

## 2020-04-01 DIAGNOSIS — K2101 Gastro-esophageal reflux disease with esophagitis, with bleeding: Secondary | ICD-10-CM | POA: Diagnosis not present

## 2020-04-01 MED ORDER — OMEPRAZOLE 40 MG PO CPDR: 40.0000 mg | DELAYED_RELEASE_CAPSULE | Freq: Every day | ORAL | 3 refills | Status: AC

## 2020-04-01 NOTE — Progress Notes (Signed)
HISTORY OF PRESENT ILLNESS:  Jennifer [name] is a 79 y.o. female with past medical history as listed below who presents today for follow-up post endoscopy for GERD and high-grade esophageal stricture requiring esophageal dilation.  The patient was initially evaluated January 02, 2018 for intermittent solid food dysphagia.  She subsequently underwent upper endoscopy January 03, 2018.  In addition to significant esophagitis a high-grade stricture was dilated with the endoscope.  He was placed on PPI and follow-up EGD January 21, 2018 was performed.  Balloon dilation to a maximal diameter 18 mm at that time.  She also has a large hiatal hernia.  Since that time she has been maintained on omeprazole 40 daily.  Follow-up 3 months follows up time.  She states that her PPI "is a miracle drug".  On the medication no indigestion or heartburn.  She has had no recurrent dysphagia.  CT scan of the abdomen pelvis from February 2021 reveals a large abdominal aortic aneurysm for which she has seen Dr. Durwin Nora.  She requests refill of her PPI.  No lower GI complaints  REVIEW OF SYSTEMS:  All non-GI ROS negative unless otherwise stated in the HPI except for back pain  Past Medical History:  Diagnosis Date  . ANEMIA-NOS   . Cataract    right cataract removed  . Clotting disorder (HCC)    Pletal  . Depression    no longer taking meds/ resolved  . DYSLIPIDEMIA   . GERD (gastroesophageal reflux disease)   . Hemorrhoids   . HYPERTENSION   . Negative colorectal cancer screening using Cologuard test   . Osteoporosis, senile    DEXA 03/2014: -3.3 R fem  . Peripheral vascular disease, unspecified (HCC) dx 02/2010 ABI  . SMOKER     Past Surgical History:  Procedure Laterality Date  . CATARACT EXTRACTION W/ INTRAOCULAR LENS IMPLANT Right 03/12/14   groat  . HEMORRHOID SURGERY  05/18/2011   Procedure: HEMORRHOIDECTOMY;  Surgeon: Robyne Askew, MD;  Location: [facility];  Service: General;  Laterality: N/A;   hemorrhoidectomy, three columns, internal and external  . IR KYPHO LUMBAR INC FX REDUCE BONE BX UNI/BIL CANNULATION INC/IMAGING  07/11/2019  . TONSILLECTOMY  1946  . TUBAL LIGATION  1979  . UPPER GASTROINTESTINAL ENDOSCOPY  01/03/2018    Social History LOUNA ROTHGEB  reports that she has been smoking cigarettes. She has a 31.00 pack-year smoking history. She has never used smokeless tobacco. She reports current alcohol use. She reports that she does not use drugs.  family history includes Breast cancer in her mother; Hypertension in her brother; Lung cancer in her father.  No Known Allergies     PHYSICAL EXAMINATION: Vital signs: BP 140/70   Pulse 79   Ht 5\' 4"  (1.626 m)   Wt 89 lb 9.6 oz (40.6 kg)   SpO2 99%   BMI 15.38 kg/m   Constitutional: Thin and frail appearing, no acute distress Psychiatric: alert and oriented x3, cooperative Eyes: extraocular movements intact, anicteric, conjunctiva pink Mouth: oral pharynx moist, no lesions Neck: supple no lymphadenopathy Cardiovascular: heart regular rate and rhythm, no murmur Lungs: clear to auscultation bilaterally Abdomen: soft, nontender, nondistended, no obvious ascites, no peritoneal signs, normal bowel sounds, no organomegaly.  Palpable aorta Rectal: Omitted Extremities: no clubbing, cyanosis, or lower extremity edema bilaterally Skin: no lesions on visible extremities Neuro: No focal deficits.  Cranial nerves intact  ASSESSMENT:  1.  GERD complicated by erosive esophagitis peptic stricture.  Asymptomatic post dilation (September  2019) on PPI (omeprazole 40 mg daily) 2.  Large abdominal aortic aneurysm.  Followed by vascular surgery   PLAN:  1.  Reflux precautions 2.  Continue omeprazole 40 mg daily 3.  Prescribe omeprazole 40 mg daily.  Multiple refills.  Medication risks reviewed 4.  Routine office follow-up 1 year.  Contact the office in the interim for any questions or problems (such as recurrent  dysphagia).

## 2020-04-01 NOTE — Patient Instructions (Signed)
We have sent the following medications to your pharmacy for you to pick up at your convenience:  Omeprazole.  Please follow up in one year  

## 2020-06-08 ENCOUNTER — Other Ambulatory Visit: Payer: Self-pay | Admitting: Internal Medicine

## 2020-06-15 ENCOUNTER — Encounter: Payer: Self-pay | Admitting: Internal Medicine

## 2020-06-15 ENCOUNTER — Encounter: Payer: Medicare Other | Admitting: Internal Medicine

## 2020-06-15 DIAGNOSIS — I700 Atherosclerosis of aorta: Secondary | ICD-10-CM | POA: Insufficient documentation

## 2020-06-15 NOTE — Patient Instructions (Signed)
  Blood work was ordered.     Medications changes include :     Your prescription(s) have been submitted to your pharmacy. Please take as directed and contact our office if you believe you are having problem(s) with the medication(s).   A referral was ordered for        Someone from their office will call you to schedule an appointment.    Please followup in 6 months  

## 2020-06-15 NOTE — Progress Notes (Signed)
Subjective:    Patient ID: Jennifer [name], female    DOB: July 27, 1940, 80 y.o.   MRN: [id number]  HPI The patient is here for follow up of their chronic medical problems, including htn, OP on prolia, vitamin d def, chronic back pain, h/o sacral insuff fractures, compression fx in lumbar, prediabetes    Medications and allergies reviewed with patient and updated if appropriate.  Patient Active Problem List   Diagnosis Date Noted  . Malnutrition (HCC) 09/03/2019  . Sacral insufficiency fracture 08/04/2019  . Vitamin D deficiency 08/04/2019  . Closed compression fracture of L1 vertebra (HCC) 08/01/2019  . Back pain 07/05/2019  . Lumbar radiculopathy 06/27/2019  . Lung nodule 06/10/2019  . Abdominal aortic aneurysm (AAA) (HCC) 06/10/2019  . Chest pain, musculoskeletal 06/10/2019  . Ascending aortic aneurysm (HCC) 06/10/2019  . Hiatal hernia, large 01/09/2018  . GERD (gastroesophageal reflux disease) 12/08/2016  . History of Clostridium difficile infection 11/29/2016  . Prediabetes 11/02/2016  . Carotid arterial disease (HCC) 10/20/2015  . Anxiety state   . PAD (peripheral artery disease) (HCC) 10/10/2011  . Osteoporosis, senile 05/17/2011  . Dyslipidemia 05/03/2009  . Tobacco abuse 04/14/2009  . Essential hypertension 04/14/2009    Current Outpatient Medications on File Prior to Visit  Medication Sig Dispense Refill  . ALPRAZolam (XANAX) 0.25 MG tablet TAKE 1 TABLET BY MOUTH EVERY NIGHT AT BEDTIME AS NEEDED FOR ANXIETY OR SLEEP 30 tablet 5  . amLODipine (NORVASC) 5 MG tablet Take 1 tablet (5 mg total) by mouth daily. 180 tablet 3  . aspirin 81 MG tablet Take 81 mg by mouth daily before breakfast.    . atorvastatin (LIPITOR) 20 MG tablet TAKE 1 TABLET BY MOUTH EVERY DAY 90 tablet 1  . calcium-vitamin D (OSCAL WITH D) 500-200 MG-UNIT per tablet Take 2 tablets by mouth daily.    . cilostazol (PLETAL) 50 MG tablet TAKE 1 TABLET(50 MG) BY MOUTH TWICE DAILY 180 tablet 2  .  omeprazole (PRILOSEC) 40 MG capsule Take 1 capsule (40 mg total) by mouth daily. 90 capsule 3  . potassium chloride SA (KLOR-CON) 20 MEQ tablet TAKE 1 TABLET(20 MEQ) BY MOUTH DAILY 90 tablet 3   No current facility-administered medications on file prior to visit.    Past Medical History:  Diagnosis Date  . ANEMIA-NOS   . Cataract    right cataract removed  . Clotting disorder (HCC)    Pletal  . Depression    no longer taking meds/ resolved  . DYSLIPIDEMIA   . GERD (gastroesophageal reflux disease)   . Hemorrhoids   . HYPERTENSION   . Negative colorectal cancer screening using Cologuard test   . Osteoporosis, senile    DEXA 03/2014: -3.3 R fem  . Peripheral vascular disease, unspecified (HCC) dx 02/2010 ABI  . SMOKER     Past Surgical History:  Procedure Laterality Date  . CATARACT EXTRACTION W/ INTRAOCULAR LENS IMPLANT Right 03/12/14   groat  . HEMORRHOID SURGERY  05/18/2011   Procedure: HEMORRHOIDECTOMY;  Surgeon: Robyne Askew, MD;  Location: [facility];  Service: General;  Laterality: N/A;  hemorrhoidectomy, three columns, internal and external  . IR KYPHO LUMBAR INC FX REDUCE BONE BX UNI/BIL CANNULATION INC/IMAGING  07/11/2019  . TONSILLECTOMY  1946  . TUBAL LIGATION  1979  . UPPER GASTROINTESTINAL ENDOSCOPY  01/03/2018    Social History   Socioeconomic History  . Marital status: Single    Spouse name: Not on file  . Number of  children: Not on file  . Years of education: Not on file  . Highest education level: Not on file  Occupational History  . Occupation: retired  Tobacco Use  . Smoking status: Light Tobacco Smoker    Packs/day: 0.50    Years: 62.00    Pack years: 31.00    Types: Cigarettes    Last attempt to quit: 07/15/2019    Years since quitting: 0.9  . Smokeless tobacco: Never Used  Vaping Use  . Vaping Use: Never used  Substance and Sexual Activity  . Alcohol use: Yes    Alcohol/week: 0.0 standard drinks    Comment: Social- 1 scotch, 1 glass wine  weekly  . Drug use: No  . Sexual activity: Not Currently    Birth control/protection: Post-menopausal  Other Topics Concern  . Not on file  Social History Narrative   Single, lives alone. Retired Pensions consultant. Enjoys Agricultural consultant work.   Social Determinants of Health   Financial Resource Strain: Not on file  Food Insecurity: Not on file  Transportation Needs: Not on file  Physical Activity: Not on file  Stress: Not on file  Social Connections: Not on file    Family History  Problem Relation Age of Onset  . Breast cancer Mother   . Lung cancer Father   . Hypertension Brother   . Colon cancer Neg Hx   . Esophageal cancer Neg Hx   . Stomach cancer Neg Hx   . Rectal cancer Neg Hx     Review of Systems     Objective:  There were no vitals filed for this visit. BP Readings from Last 3 Encounters:  04/01/20 140/70  03/03/20 (!) 144/70  12/08/19 (!) 142/74   Wt Readings from Last 3 Encounters:  04/01/20 89 lb 9.6 oz (40.6 kg)  03/03/20 91 lb 12.8 oz (41.6 kg)  12/08/19 85 lb 9.6 oz (38.8 kg)   There is no height or weight on file to calculate BMI.   Physical Exam    Constitutional: Appears well-developed and well-nourished. No distress.  HENT:  Head: Normocephalic and atraumatic.  Neck: Neck supple. No tracheal deviation present. No thyromegaly present.  No cervical lymphadenopathy Cardiovascular: Normal rate, regular rhythm and normal heart sounds.   No murmur heard. No carotid bruit .  No edema Pulmonary/Chest: Effort normal and breath sounds normal. No respiratory distress. No has no wheezes. No rales.  Skin: Skin is warm and dry. Not diaphoretic.  Psychiatric: Normal mood and affect. Behavior is normal.      Assessment & Plan:    See Problem List for Assessment and Plan of chronic medical problems.    This visit occurred during the SARS-CoV-2 public health emergency.  Safety protocols were in place, including screening questions prior to the visit,  additional usage of staff PPE, and extensive cleaning of exam room while observing appropriate contact time as indicated for disinfecting solutions.    This encounter was created in error - please disregard.

## 2020-06-16 ENCOUNTER — Telehealth: Payer: Self-pay | Admitting: Internal Medicine

## 2020-06-16 NOTE — Progress Notes (Signed)
°  Chronic Care Management   Note  06/16/2020 Name: [name] MRN: [id number] DOB: 06/02/1940  Jennifer [name] is a 80 y.o. year old female who is a primary care patient of Burns, Bobette Mo, MD. I reached out to [name] by phone today in response to a referral sent by Ms. Kristopher Glee PCP, Pincus Sanes, MD.   Ms. Pucillo was given information about Chronic Care Management services today including:  1. CCM service includes personalized support from designated clinical staff supervised by her physician, including individualized plan of care and coordination with other care providers 2. 24/7 contact phone numbers for assistance for urgent and routine care needs. 3. Service will only be billed when office clinical staff spend 20 minutes or more in a month to coordinate care. 4. Only one practitioner may furnish and bill the service in a calendar month. 5. The patient may stop CCM services at any time (effective at the end of the month) by phone call to the office staff.   Patient agreed to services and verbal consent obtained.   Follow up plan:   Carley Perdue [employer] Scheduler

## 2020-07-04 NOTE — Patient Instructions (Addendum)
  Blood work was ordered.     Medications changes include :   Start sertraline 25 mg daily  Your prescription(s) have been submitted to your pharmacy. Please take as directed and contact our office if you believe you are having problem(s) with the medication(s).    Please followup in 6 months

## 2020-07-04 NOTE — Progress Notes (Signed)
Subjective:    Patient ID: Jennifer [name], female    DOB: [birth year], 80 y.o.   MRN: [id number]  HPI The patient is here for follow up of their chronic medical problems, including htn, OP on prolia, vitamin d def, chronic back pain, h/o sacral insuff fractures, compression fx in lumbar, prediabetes, PAD, GERD   She is still smoking.    She has some anxiety attacks - typically when she goes out.     Medications and allergies reviewed with patient and updated if appropriate.  Patient Active Problem List   Diagnosis Date Noted  . Chronic low back pain 07/05/2020  . Aortic atherosclerosis (HCC) 06/15/2020  . Malnutrition (HCC) 09/03/2019  . Sacral insufficiency fracture 08/04/2019  . Vitamin D deficiency 08/04/2019  . Closed compression fracture of L1 vertebra (HCC) 08/01/2019  . Back pain 07/05/2019  . Lumbar radiculopathy 06/27/2019  . Lung nodule 06/10/2019  . Abdominal aortic aneurysm (AAA) (HCC) 06/10/2019  . Chest pain, musculoskeletal 06/10/2019  . Ascending aortic aneurysm (HCC) 06/10/2019  . Hiatal hernia, large 01/09/2018  . GERD (gastroesophageal reflux disease) 12/08/2016  . History of Clostridium difficile infection 11/29/2016  . Prediabetes 11/02/2016  . Carotid arterial disease (HCC) 10/20/2015  . Anxiety state   . PAD (peripheral artery disease) (HCC) 10/10/2011  . Osteoporosis, senile 05/17/2011  . Dyslipidemia 05/03/2009  . Tobacco abuse 04/14/2009  . Essential hypertension 04/14/2009    Current Outpatient Medications on File Prior to Visit  Medication Sig Dispense Refill  . ALPRAZolam (XANAX) 0.25 MG tablet TAKE 1 TABLET BY MOUTH EVERY NIGHT AT BEDTIME AS NEEDED FOR ANXIETY OR SLEEP 30 tablet 5  . aspirin 81 MG tablet Take 81 mg by mouth daily before breakfast.    . atorvastatin (LIPITOR) 20 MG tablet TAKE 1 TABLET BY MOUTH EVERY DAY 90 tablet 1  . calcium-vitamin D (OSCAL WITH D) 500-200 MG-UNIT per tablet Take 2 tablets by mouth daily.    .  Cholecalciferol (VITAMIN D) 50 MCG (2000 UT) CAPS Take by mouth.    . cilostazol (PLETAL) 50 MG tablet TAKE 1 TABLET(50 MG) BY MOUTH TWICE DAILY 180 tablet 2  . omeprazole (PRILOSEC) 40 MG capsule Take 1 capsule (40 mg total) by mouth daily. 90 capsule 3  . potassium chloride SA (KLOR-CON) 20 MEQ tablet TAKE 1 TABLET(20 MEQ) BY MOUTH DAILY 90 tablet 3  . amLODipine (NORVASC) 5 MG tablet Take 1 tablet (5 mg total) by mouth daily. 180 tablet 3   No current facility-administered medications on file prior to visit.    Past Medical History:  Diagnosis Date  . ANEMIA-NOS   . Cataract    right cataract removed  . Clotting disorder (HCC)    Pletal  . Depression    no longer taking meds/ resolved  . DYSLIPIDEMIA   . GERD (gastroesophageal reflux disease)   . Hemorrhoids   . HYPERTENSION   . Negative colorectal cancer screening using Cologuard test   . Osteoporosis, senile    DEXA 03/2014: -3.3 R fem  . Peripheral vascular disease, unspecified (HCC) dx 02/2010 ABI  . SMOKER     Past Surgical History:  Procedure Laterality Date  . CATARACT EXTRACTION W/ INTRAOCULAR LENS IMPLANT Right 03/12/14   groat  . HEMORRHOID SURGERY  05/18/2011   Procedure: HEMORRHOIDECTOMY;  Surgeon: Robyne Askew, MD;  Location: [facility];  Service: General;  Laterality: N/A;  hemorrhoidectomy, three columns, internal and external  . IR KYPHO LUMBAR INC FX REDUCE  BONE BX UNI/BIL CANNULATION INC/IMAGING  07/11/2019  . TONSILLECTOMY  1946  . TUBAL LIGATION  1979  . UPPER GASTROINTESTINAL ENDOSCOPY  01/03/2018    Social History   Socioeconomic History  . Marital status: Single    Spouse name: Not on file  . Number of children: Not on file  . Years of education: Not on file  . Highest education level: Not on file  Occupational History  . Occupation: retired  Tobacco Use  . Smoking status: Light Tobacco Smoker    Packs/day: 0.50    Years: 62.00    Pack years: 31.00    Types: Cigarettes    Last attempt to  quit: 07/15/2019    Years since quitting: 0.9  . Smokeless tobacco: Never Used  Vaping Use  . Vaping Use: Never used  Substance and Sexual Activity  . Alcohol use: Yes    Alcohol/week: 0.0 standard drinks    Comment: Social- 1 scotch, 1 glass wine weekly  . Drug use: No  . Sexual activity: Not Currently    Birth control/protection: Post-menopausal  Other Topics Concern  . Not on file  Social History Narrative   Single, lives alone. Retired Pensions consultant. Enjoys Agricultural consultant work.   Social Determinants of Health   Financial Resource Strain: Not on file  Food Insecurity: Not on file  Transportation Needs: Not on file  Physical Activity: Not on file  Stress: Not on file  Social Connections: Not on file    Family History  Problem Relation Age of Onset  . Breast cancer Mother   . Lung cancer Father   . Hypertension Brother   . Colon cancer Neg Hx   . Esophageal cancer Neg Hx   . Stomach cancer Neg Hx   . Rectal cancer Neg Hx     Review of Systems  Constitutional: Negative for chills and fever.  Respiratory: Negative for cough, shortness of breath and wheezing.   Cardiovascular: Negative for chest pain, palpitations and leg swelling.  Neurological: Negative for light-headedness and headaches.       Objective:   Vitals:   07/05/20 1355  BP: (!) 142/76  Pulse: 86  Temp: 98 F (36.7 C)  SpO2: 97%   BP Readings from Last 3 Encounters:  07/05/20 (!) 142/76  04/01/20 140/70  03/03/20 (!) 144/70   Wt Readings from Last 3 Encounters:  07/05/20 93 lb (42.2 kg)  04/01/20 89 lb 9.6 oz (40.6 kg)  03/03/20 91 lb 12.8 oz (41.6 kg)   Body mass index is 15.96 kg/m.   Physical Exam    Constitutional: Appears well-developed and well-nourished. No distress.  HENT:  Head: Normocephalic and atraumatic.  Neck: Neck supple. No tracheal deviation present. No thyromegaly present.  No cervical lymphadenopathy Cardiovascular: Normal rate, regular rhythm and normal heart  sounds.   No murmur heard. No carotid bruit .  No edema Pulmonary/Chest: Effort normal and breath sounds normal. No respiratory distress. No has no wheezes. No rales.  Skin: Skin is warm and dry. Not diaphoretic.  Psychiatric: Normal mood and affect. Behavior is normal.      Assessment & Plan:    See Problem List for Assessment and Plan of chronic medical problems.    This visit occurred during the SARS-CoV-2 public health emergency.  Safety protocols were in place, including screening questions prior to the visit, additional usage of staff PPE, and extensive cleaning of exam room while observing appropriate contact time as indicated for disinfecting solutions.

## 2020-07-05 ENCOUNTER — Encounter: Payer: Self-pay | Admitting: Internal Medicine

## 2020-07-05 ENCOUNTER — Other Ambulatory Visit: Payer: Self-pay

## 2020-07-05 ENCOUNTER — Ambulatory Visit (INDEPENDENT_AMBULATORY_CARE_PROVIDER_SITE_OTHER): Payer: Medicare Other | Admitting: Internal Medicine

## 2020-07-05 VITALS — BP 142/76 | HR 86 | Temp 98.0°F | Ht 64.0 in | Wt 93.0 lb

## 2020-07-05 DIAGNOSIS — K219 Gastro-esophageal reflux disease without esophagitis: Secondary | ICD-10-CM

## 2020-07-05 DIAGNOSIS — I700 Atherosclerosis of aorta: Secondary | ICD-10-CM

## 2020-07-05 DIAGNOSIS — E785 Hyperlipidemia, unspecified: Secondary | ICD-10-CM | POA: Diagnosis not present

## 2020-07-05 DIAGNOSIS — G8929 Other chronic pain: Secondary | ICD-10-CM | POA: Diagnosis not present

## 2020-07-05 DIAGNOSIS — I10 Essential (primary) hypertension: Secondary | ICD-10-CM

## 2020-07-05 DIAGNOSIS — F411 Generalized anxiety disorder: Secondary | ICD-10-CM

## 2020-07-05 DIAGNOSIS — R7303 Prediabetes: Secondary | ICD-10-CM | POA: Diagnosis not present

## 2020-07-05 DIAGNOSIS — E559 Vitamin D deficiency, unspecified: Secondary | ICD-10-CM | POA: Diagnosis not present

## 2020-07-05 DIAGNOSIS — I739 Peripheral vascular disease, unspecified: Secondary | ICD-10-CM

## 2020-07-05 DIAGNOSIS — M545 Low back pain, unspecified: Secondary | ICD-10-CM | POA: Diagnosis not present

## 2020-07-05 DIAGNOSIS — M810 Age-related osteoporosis without current pathological fracture: Secondary | ICD-10-CM | POA: Diagnosis not present

## 2020-07-05 LAB — COMPREHENSIVE METABOLIC PANEL
ALT: 6 U/L (ref 0–35)
AST: 12 U/L (ref 0–37)
Albumin: 3.6 g/dL (ref 3.5–5.2)
Alkaline Phosphatase: 49 U/L (ref 39–117)
BUN: 14 mg/dL (ref 6–23)
CO2: 25 mEq/L (ref 19–32)
Calcium: 9.2 mg/dL (ref 8.4–10.5)
Chloride: 98 mEq/L (ref 96–112)
Creatinine, Ser: 0.97 mg/dL (ref 0.40–1.20)
GFR: 54.66 mL/min — ABNORMAL LOW (ref >60.00)
Glucose, Bld: 95 mg/dL (ref 70–99)
Potassium: 3.5 mEq/L (ref 3.5–5.1)
Sodium: 132 mEq/L — ABNORMAL LOW (ref 135–145)
Total Bilirubin: 0.5 mg/dL (ref 0.2–1.2)
Total Protein: 7.8 g/dL (ref 6.0–8.3)

## 2020-07-05 LAB — CBC WITH DIFFERENTIAL/PLATELET
Basophils Absolute: 0.1 10*3/uL (ref 0.0–0.1)
Basophils Relative: 0.5 % (ref 0.0–3.0)
Eosinophils Absolute: 0 10*3/uL (ref 0.0–0.7)
Eosinophils Relative: 0.2 % (ref 0.0–5.0)
HCT: 31.8 % — ABNORMAL LOW (ref 36.0–46.0)
Hemoglobin: 10.4 g/dL — ABNORMAL LOW (ref 12.0–15.0)
Lymphocytes Relative: 13.1 % (ref 12.0–46.0)
Lymphs Abs: 1.9 10*3/uL (ref 0.7–4.0)
MCHC: 32.7 g/dL (ref 30.0–36.0)
MCV: 76.4 fl — ABNORMAL LOW (ref 78.0–100.0)
Monocytes Absolute: 0.6 10*3/uL (ref 0.1–1.0)
Monocytes Relative: 4.3 % (ref 3.0–12.0)
Neutro Abs: 12.1 10*3/uL — ABNORMAL HIGH (ref 1.4–7.7)
Neutrophils Relative %: 81.9 % — ABNORMAL HIGH (ref 43.0–77.0)
Platelets: 382 10*3/uL (ref 150.0–400.0)
RBC: 4.16 Mil/uL (ref 3.87–5.11)
RDW: 16.4 % — ABNORMAL HIGH (ref 11.5–15.5)
WBC: 14.8 10*3/uL — ABNORMAL HIGH (ref 4.0–10.5)

## 2020-07-05 LAB — LIPID PANEL
Cholesterol: 160 mg/dL (ref 0–200)
HDL: 59.7 mg/dL (ref >39.00)
LDL Cholesterol: 86 mg/dL (ref 0–99)
NonHDL: 99.83
Total CHOL/HDL Ratio: 3
Triglycerides: 71 mg/dL (ref 0.0–149.0)
VLDL: 14.2 mg/dL (ref 0.0–40.0)

## 2020-07-05 LAB — HEMOGLOBIN A1C: Hgb A1c MFr Bld: 6 % (ref 4.6–6.5)

## 2020-07-05 LAB — VITAMIN D 25 HYDROXY (VIT D DEFICIENCY, FRACTURES): VITD: 54.33 ng/mL (ref 30.00–100.00)

## 2020-07-05 MED ORDER — SERTRALINE HCL 25 MG PO TABS
25.0000 mg | ORAL_TABLET | Freq: Every day | ORAL | 5 refills | Status: DC
Start: 2020-07-05 — End: 2020-08-05

## 2020-07-05 NOTE — Assessment & Plan Note (Signed)
Chronic GERD controlled Continue omeprazole 40 mg daily 

## 2020-07-05 NOTE — Assessment & Plan Note (Signed)
Chronic Check lipid panel  Continue atorvastatin 20 mg daily Regular exercise and healthy diet encouraged  

## 2020-07-05 NOTE — Assessment & Plan Note (Signed)
chronic Related to compression fx Stressed smoking cessation Will go back to see ortho

## 2020-07-05 NOTE — Assessment & Plan Note (Signed)
chronic No leg pain with walking Continue pletal 50 mg daily and ASA 81 mg daily

## 2020-07-05 NOTE — Assessment & Plan Note (Addendum)
Chronic BP controlled Continue amlodipine 5 mg daily cmp   BP Readings from Last 3 Encounters:  07/05/20 (!) 142/76  04/01/20 140/70  03/03/20 (!) 144/70

## 2020-07-05 NOTE — Assessment & Plan Note (Signed)
Chronic Taking vitamin D daily Check vitamin D level  

## 2020-07-05 NOTE — Assessment & Plan Note (Addendum)
Chronic Usually sleep is good  -- takes xanax 0.25 mg at night only as needed Has some generalized anxiety / agoraphobia  Discussed therapy, daily SSRI  Start Sertraline 25 mg daily

## 2020-07-05 NOTE — Addendum Note (Signed)
Addended by: Madelon Lips on: 07/05/2020 02:36 PM   Modules accepted: Orders

## 2020-07-05 NOTE — Assessment & Plan Note (Addendum)
Chronic On prolia q 6 months - last 12/21 Taking calcium, vitamin daily Check vitamin d level Stressed smoking cessation

## 2020-07-05 NOTE — Assessment & Plan Note (Signed)
Chronic Check a1c Low sugar / carb diet Stressed regular exercise  

## 2020-07-08 ENCOUNTER — Telehealth: Payer: Self-pay | Admitting: Internal Medicine

## 2020-07-08 NOTE — Telephone Encounter (Signed)
Patient said she got a Mychart message from Dr. Lawerance Bach asking if she would want to increase atorvastatin (LIPITOR) 20 MG tablet to 40 mg. I do not see any documentation. She can be reached at [phone]. Please advise

## 2020-07-09 MED ORDER — ATORVASTATIN CALCIUM 40 MG PO TABS: 40.0000 mg | ORAL_TABLET | Freq: Every day | ORAL | 3 refills | Status: DC

## 2020-08-04 ENCOUNTER — Telehealth: Payer: Self-pay

## 2020-08-04 NOTE — Progress Notes (Signed)
    Chronic Care Management Pharmacy Assistant     Reason for Encounter: Initial Questions  Appointment: Telephone 08/05/20 @ 2pm   Recent office visits:  3/14//22 Winferd Humphrey - PCP  Recent consult visits:  04/01/20 Starleen Blue - Gastroenterology 03/03/20 Verne Carrow, MD - Cardiology  Hospital visits:  None in previous 6 months  Medications: Outpatient Encounter Medications as of 08/04/2020  Medication Sig  . ALPRAZolam (XANAX) 0.25 MG tablet TAKE 1 TABLET BY MOUTH EVERY NIGHT AT BEDTIME AS NEEDED FOR ANXIETY OR SLEEP  . amLODipine (NORVASC) 5 MG tablet Take 1 tablet (5 mg total) by mouth daily.  Marland Kitchen aspirin 81 MG tablet Take 81 mg by mouth daily before breakfast.  . atorvastatin (LIPITOR) 40 MG tablet Take 1 tablet (40 mg total) by mouth daily.  . calcium-vitamin D (OSCAL WITH D) 500-200 MG-UNIT per tablet Take 2 tablets by mouth daily.  . Cholecalciferol (VITAMIN D) 50 MCG (2000 UT) CAPS Take by mouth.  . cilostazol (PLETAL) 50 MG tablet TAKE 1 TABLET(50 MG) BY MOUTH TWICE DAILY  . omeprazole (PRILOSEC) 40 MG capsule Take 1 capsule (40 mg total) by mouth daily.  . potassium chloride SA (KLOR-CON) 20 MEQ tablet TAKE 1 TABLET(20 MEQ) BY MOUTH DAILY  . sertraline (ZOLOFT) 25 MG tablet Take 1 tablet (25 mg total) by mouth daily.   No facility-administered encounter medications on file as of 08/04/2020.    Have you seen any other providers since your last visit?  Patient states she hasn't seen any providers recently.  Any changes in your medications or health?  Patient states no changes at this time.  Any side effects from any medications?  Patient states no side effects.  Do you have an symptoms or problems not managed by your medications?  Patient states not at this time.  Any concerns about your health right now?  Patient states she has a bad back and  Limits what she can do.   Has your provider asked that you check blood pressure, blood sugar, or  follow special diet at home?  Patient states she checks her BP regularly but does not record, this morning it was 170/80 and she took it after drinking two cups of coffee. Patient states she also uses a wrists cuff and she was told that it wasn't accurate.  Do you get any type of exercise on a regular basis?  Patient states that her energy level is low and she can not stand longer than five minutes due to her having back pain.  Can you think of a goal you would like to reach for your health?  Patient states she would like to stand for longer period of times before back starts hurting.  Do you have any problems getting your medications?  Patient states no problem getting medication.  Is there anything that you would like to discuss during the appointment?  Patient states not at this time.  Please bring medications and supplements to appointment   Star Rating Drugs: Atorvastatin - Last fill 06/09/20 90D  Benedict Needy, RMA Clinical Pharmacists Assistant [phone]  Time Spent: [id number]

## 2020-08-05 ENCOUNTER — Other Ambulatory Visit: Payer: Self-pay

## 2020-08-05 ENCOUNTER — Other Ambulatory Visit: Payer: Self-pay | Admitting: Internal Medicine

## 2020-08-05 ENCOUNTER — Ambulatory Visit (INDEPENDENT_AMBULATORY_CARE_PROVIDER_SITE_OTHER): Payer: Medicare Other | Admitting: Pharmacist

## 2020-08-05 DIAGNOSIS — I700 Atherosclerosis of aorta: Secondary | ICD-10-CM

## 2020-08-05 DIAGNOSIS — I10 Essential (primary) hypertension: Secondary | ICD-10-CM

## 2020-08-05 DIAGNOSIS — Z720 Tobacco use: Secondary | ICD-10-CM

## 2020-08-05 DIAGNOSIS — E785 Hyperlipidemia, unspecified: Secondary | ICD-10-CM

## 2020-08-05 DIAGNOSIS — I739 Peripheral vascular disease, unspecified: Secondary | ICD-10-CM

## 2020-08-05 DIAGNOSIS — K219 Gastro-esophageal reflux disease without esophagitis: Secondary | ICD-10-CM

## 2020-08-05 MED ORDER — SERTRALINE HCL 25 MG PO TABS: 50.0000 mg | ORAL_TABLET | Freq: Every day | ORAL | 5 refills | Status: DC

## 2020-08-05 NOTE — Progress Notes (Signed)
Chronic Care Management Pharmacy Note  08/05/2020 Name:  Jennifer [name] MRN:  [id number] DOB:  Jun 26, 1940  Subjective: Jennifer [name] is an 80 y.o. year old female who is a primary patient of Burns, Claudina Lick, MD.  The CCM team was consulted for assistance with disease management and care coordination needs.    Engaged with patient by telephone for initial visit in response to provider referral for pharmacy case management and/or care coordination services.   Consent to Services:  The patient was given the following information about Chronic Care Management services today, agreed to services, and gave verbal consent: 1. CCM service includes personalized support from designated clinical staff supervised by the primary care provider, including individualized plan of care and coordination with other care providers 2. 24/7 contact phone numbers for assistance for urgent and routine care needs. 3. Service will only be billed when office clinical staff spend 20 minutes or more in a month to coordinate care. 4. Only one practitioner may furnish and bill the service in a calendar month. 5.The patient may stop CCM services at any time (effective at the end of the month) by phone call to the office staff. 6. The patient will be responsible for cost sharing (co-pay) of up to 20% of the service fee (after annual deductible is met). Patient agreed to services and consent obtained.  Patient Care Team: Binnie Rail, MD as PCP - General (Internal Medicine) Burnell Blanks, MD as PCP - Cardiology (Cardiology) Irene Shipper, MD as Consulting Physician (Gastroenterology) Charlton Haws, [facility] as Pharmacist (Pharmacist)   Patient lives at home alone. She is went to [facility] [facility] in [place] and is a [employer].  Recent office visits: 07/05/20 Dr Quay Burow OV: chronic f/u, c/o anxiety attacks. Started sertraline 25 mg. Increased atorvastatin to 40 mg.  Recent consult visits: 04/01/20 Dr Henrene Pastor (GI): s/p  endoscopy, hx esophageal dilation 12/2017. No med changes, continue PPI.  03/03/20 Dr Angelena Form (cardiology): f/u PAD, ABI stable. Increased amlodipine to 5 mg.  Hospital visits: None in previous 6 months  Objective:  Lab Results  Component Value Date   CREATININE 0.97 07/05/2020   BUN 14 07/05/2020   GFR 54.66 (L) 07/05/2020   GFRNONAA >60 07/16/2019   GFRAA >60 07/16/2019   NA 132 (L) 07/05/2020   K 3.5 07/05/2020   CALCIUM 9.2 07/05/2020   CO2 25 07/05/2020   GLUCOSE 95 07/05/2020    Lab Results  Component Value Date/Time   HGBA1C 6.0 07/05/2020 02:36 PM   HGBA1C 5.9 12/09/2019 02:33 PM   GFR 54.66 (L) 07/05/2020 02:36 PM   GFR 60.83 12/09/2019 02:33 PM    Last diabetic Eye exam: No results found for: HMDIABEYEEXA  Last diabetic Foot exam: No results found for: HMDIABFOOTEX   Lab Results  Component Value Date   CHOL 160 07/05/2020   HDL 59.70 07/05/2020   LDLCALC 86 07/05/2020   LDLDIRECT 232.1 08/09/2009   TRIG 71.0 07/05/2020   CHOLHDL 3 07/05/2020    Hepatic Function Latest Ref Rng & Units 07/05/2020 12/09/2019 07/09/2019  Total Protein 6.0 - 8.3 g/dL 7.8 7.4 6.2(L)  Albumin 3.5 - 5.2 g/dL 3.6 3.8 2.6(L)  AST 0 - 37 U/L _0 ALT 0 - 35 U/L _1 Alk Phosphatase 39 - 117 U/L 49 58 81  Total Bilirubin 0.2 - 1.2 mg/dL 0.5 0.3 0.6  Bilirubin, Direct 0.0 - 0.3 mg/dL - - -    Lab Results  Component Value Date/Time   TSH 3.63 11/01/2018 03:55 PM   TSH 3.40 05/04/2017 03:02 PM    CBC Latest Ref Rng & Units 07/05/2020 12/09/2019 07/16/2019  WBC 4.0 - 10.5 K/uL 14.8(H) 8.5 7.1  Hemoglobin 12.0 - 15.0 g/dL 10.4(L) 10.7 Repeated and verified X2.(L) 9.9(L)  Hematocrit 36.0 - 46.0 % 31.8(L) 32.3(L) 31.8(L)  Platelets 150.0 - 400.0 K/uL 382.0 230.0 403(H)    Lab Results  Component Value Date/Time   VD25OH 54.33 07/05/2020 02:36 PM   VD25OH 32.64 12/09/2019 02:33 PM    Clinical ASCVD: Yes  The ASCVD Risk score Mikey Bussing DC Jr., et al., 2013) failed to  calculate for the following reasons:   The 2013 ASCVD risk score is only valid for ages 68 to 65    Depression screen PHQ 2/9 07/05/2020 11/01/2018 10/31/2017  Decreased Interest 0 0 0  Down, Depressed, Hopeless 0 0 0  PHQ - 2 Score 0 0 0     Social History   Tobacco Use  Smoking Status Light Tobacco Smoker  . Packs/day: 0.50  . Years: 62.00  . Pack years: 31.00  . Types: Cigarettes  . Last attempt to quit: 07/15/2019  . Years since quitting: 1.0  Smokeless Tobacco Never Used   BP Readings from Last 3 Encounters:  07/05/20 (!) 142/76  04/01/20 140/70  03/03/20 (!) 144/70   Pulse Readings from Last 3 Encounters:  07/05/20 86  04/01/20 79  03/03/20 86   Wt Readings from Last 3 Encounters:  07/05/20 93 lb (42.2 kg)  04/01/20 89 lb 9.6 oz (40.6 kg)  03/03/20 91 lb 12.8 oz (41.6 kg)   BMI Readings from Last 3 Encounters:  07/05/20 15.96 kg/m  04/01/20 15.38 kg/m  03/03/20 15.76 kg/m    Assessment/Interventions: Review of patient past medical history, allergies, medications, health status, including review of consultants reports, laboratory and other test data, was performed as part of comprehensive evaluation and provision of chronic care management services.   SDOH:  (Social Determinants of Health) assessments and interventions performed: Yes SDOH Interventions   Flowsheet Row Most Recent Value  SDOH Interventions   Financial Strain Interventions Intervention Not Indicated     SDOH Screenings   Alcohol Screen: Not on file  Depression (PHQ2-9): Low Risk   . PHQ-2 Score: 0  Financial Resource Strain: Low Risk   . Difficulty of Paying Living Expenses: Not hard at all  Food Insecurity: Not on file  Housing: Not on file  Physical Activity: Not on file  Social Connections: Not on file  Stress: Not on file  Tobacco Use: High Risk  . Smoking Tobacco Use: Light Tobacco Smoker  . Smokeless Tobacco Use: Never Used  Transportation Needs: Not on file    CCM Care  Plan  No Known Allergies  Medications Reviewed Today    Reviewed by Binnie Rail, MD (Physician) on 07/05/20 at 1408  Med List Status: <None>  Medication Order Taking? Sig Documenting Provider Last Dose Status Informant  ALPRAZolam (XANAX) 0.25 MG tablet [id number] Yes TAKE 1 TABLET BY MOUTH EVERY NIGHT AT BEDTIME AS NEEDED FOR ANXIETY OR SLEEP Burns, Claudina Lick, MD Taking Active   amLODipine (NORVASC) 5 MG tablet [id number]  Take 1 tablet (5 mg total) by mouth daily. Burnell Blanks, MD  Expired 06/01/20 2359   aspirin 81 MG tablet [id number] Yes Take 81 mg by mouth daily before breakfast. [provider] Taking Active Self  atorvastatin (LIPITOR) 20 MG tablet [id number] Yes TAKE 1 TABLET  BY MOUTH EVERY DAY Burns, Claudina Lick, MD Taking Active   calcium-vitamin D (OSCAL WITH D) 500-200 MG-UNIT per tablet [id number] Yes Take 2 tablets by mouth daily. [provider] Taking Active Self  Cholecalciferol (VITAMIN D) 50 MCG (2000 UT) CAPS [id number] Yes Take by mouth. [provider] Taking Active   cilostazol (PLETAL) 50 MG tablet [id number] Yes TAKE 1 TABLET(50 MG) BY MOUTH TWICE DAILY Burns, Claudina Lick, MD Taking Active   omeprazole (PRILOSEC) 40 MG capsule [id number] Yes Take 1 capsule (40 mg total) by mouth daily. Irene Shipper, MD Taking Active   potassium chloride SA (KLOR-CON) 20 MEQ tablet [id number] Yes TAKE 1 TABLET(20 MEQ) BY MOUTH DAILY Burns, Claudina Lick, MD Taking Active           Patient Active Problem List   Diagnosis Date Noted  . Chronic low back pain 07/05/2020  . Aortic atherosclerosis ([place]) 06/15/2020  . Malnutrition ([place]) 09/03/2019  . Sacral insufficiency fracture 08/04/2019  . Vitamin D deficiency 08/04/2019  . Closed compression fracture of L1 vertebra ([place]) 08/01/2019  . Back pain 07/05/2019  . Lumbar radiculopathy 06/27/2019  . Lung nodule 06/10/2019  . Abdominal aortic aneurysm (AAA) ([place]) 06/10/2019  . Chest pain, musculoskeletal 06/10/2019  .  Ascending aortic aneurysm ([place]) 06/10/2019  . Hiatal hernia, large 01/09/2018  . GERD (gastroesophageal reflux disease) 12/08/2016  . History of Clostridium difficile infection 11/29/2016  . Prediabetes 11/02/2016  . Carotid arterial disease ([place]) 10/20/2015  . Anxiety state   . PAD (peripheral artery disease) ([place]) 10/10/2011  . Osteoporosis, senile 05/17/2011  . Dyslipidemia 05/03/2009  . Tobacco abuse 04/14/2009  . Essential hypertension 04/14/2009    Immunization History  Administered Date(s) Administered  . Influenza Split 03/08/2011, 01/01/2012  . Influenza, High Dose Seasonal PF 01/13/2014, 01/12/2017, 02/21/2018, 12/23/2018  . Influenza,inj,Quad PF,6+ Mos 01/01/2013, 01/07/2015, 01/23/2019  . Influenza-Unspecified 01/23/2016  . PFIZER(Purple Top)SARS-COV-2 Vaccination 05/30/2019, 06/24/2019  . Pneumococcal Conjugate-13 03/30/2014  . Pneumococcal Polysaccharide-23 07/21/2013  . Td 07/21/2013    Conditions to be addressed/monitored:  Hypertension, Hyperlipidemia, GERD, Anxiety, Osteoporosis and Tobacco use  Care Plan : [place]  Updates made by Charlton Haws, [place] since 08/05/2020 12:00 AM    Problem: Hypertension, Hyperlipidemia, GERD, Anxiety, Osteoporosis and Tobacco use   Priority: High    Long-Range Goal: Disease management   Start Date: 08/05/2020  Expected End Date: 02/04/2021  This Visit's Progress: On track  Priority: High  Note:   Current Barriers:  . Unable to independently monitor therapeutic efficacy . Unable to maintain control of anxiety  Pharmacist Clinical Goal(s):  Marland Kitchen Patient will achieve adherence to monitoring guidelines and medication adherence to achieve therapeutic efficacy . maintain control of anxiety as evidenced by patient report  through collaboration with PharmD and provider.   Interventions: . 1:1 collaboration with Binnie Rail, MD regarding development and update of comprehensive plan of care as evidenced by  provider attestation and co-signature . Inter-disciplinary care team collaboration (see longitudinal plan of care) . Comprehensive medication review performed; medication list updated in electronic medical record  Hypertension (BP goal <140/90) -Unknown control - pt is checking BP on wrist cuff at home and is unsure of accuracy; most recent office BP was relatively controlled -Current treatment: . Amlodipine 5 mg daily HS -Current home readings: 155/92 (before coffee) -Denies hypotensive/hypertensive symptoms -Educated on BP goals and benefits of medications for prevention of heart attack, stroke and kidney damage; Importance of home blood pressure monitoring; Proper  BP monitoring technique; -Counseled to monitor BP at home daily, document, and provide log at future appointments -Recommended to continue current medication  Hyperlipidemia / PAD: (LDL goal < 70) -Not ideally controlled - statin was increased at last visit to achieve LDL goal; plan to recheck cholesterol panel at next PCP visit -Current treatment: . Atorvastatin 40 mg daily HS . Cilostazol 50 mg BID HS . Aspirin 81 mg daily -Current dietary patterns: doesn't eat meat -Current exercise habits: limited due to back pain; does  -Educated on Cholesterol goals;  Benefits of statin for ASCVD risk reduction; -Recommended to continue current medication  Depression/Anxiety (Goal: manage symptoms) -Not ideally controlled - pt started sertraline about a month ago, has not noticed much difference yet. Pt uses alprazolam sparingly for sleep, but not often as she usually doesn't have problems with sleep -Current treatment: . Sertraline 25 mg daily . Alprazolam 0.25 mg HS prn -Medications previously tried/failed: paroxetine -PHQ9: 0 -GAD7: not on file -Connected with PCP for mental health support -Counseled on potential side effects of sertraline (pt denies) -Recommend to increase sertraline to 50 mg daily  Tobacco use (Goal:  reduce cigarettes) -Uncontrolled  -30 pack-year history, 1/2 ppd -Previous quit attempts: once quit for 6 months;  -Patient smokes After 30 minutes of waking  -Patient triggers include: drinking coffee -Counseled on benefits of smoking cessation; pt acknowledges she would like to quit smoking but is not currently ready to set a quit date  Osteoporosis (Goal: prevent fractures) -Controlled -Last DEXA Scan: 11/05/2018   T-Score femoral neck: -3.0  T-Score lumbar spine: +0.6 -Patient is a candidate for pharmacologic treatment due to T-Score < -2.5 in femoral neck -Current treatment  . Prolia q6 months (last given 03/24/2020) . Calcium-Vitamin D 500-200 mg-IU - 2 tab daily . Vitamin D 2000 IU daily -Recommend [phone] units of vitamin D daily. Recommend 1200 mg of calcium daily from dietary and supplemental sources. -Recommended to continue current medication  GERD (Goal: manage symptoms) -Controlled - Patient is satisfied with current regimen and denies issues -Current treatment  . Omeprazole 40 mg daily -Recommended to continue current medication  Health Maintenance -Vaccine gaps: Shingrix -Current therapy:  . Klor-con 20 meq daily . Probiotic  -Pt reports Klor Con tablets are too big to swallow, she has been cutting into quarters -Recommend switching to Klor Con 10 mEq x 2 daily to ease swallowing issue  Patient Goals/Self-Care Activities . Patient will:  - take medications as prescribed focus on medication adherence by routine check blood pressure daily, document, and provide at future appointments  Follow Up Plan: Telephone follow up appointment with care management team member scheduled for: 1 month     Medication Assistance: None required.  Patient affirms current coverage meets needs.  Patient's preferred pharmacy is:  [id number] [address], [address] [place] [address] Phone:  [phone] Fax: [phone]  Uses pill box? Yes Pt endorses 100% compliance  We discussed: Current pharmacy is preferred with insurance plan and patient is satisfied with pharmacy services Patient decided to: Continue current medication management strategy  Care Plan and Follow Up Patient Decision:  Patient agrees to Care Plan and Follow-up.  Plan: Telephone follow up appointment with care management team member scheduled for:  1 month  Charlene Brooke, PharmD, [place], CPP Clinical Pharmacist [facility] at [facility] [phone]

## 2020-08-05 NOTE — Patient Instructions (Addendum)
Visit Information  Phone number for Pharmacist: [phone]  Thank you for meeting with me to discuss your medications! I look forward to working with you to achieve your health care goals. Below is a summary of what we talked about during the visit:  Goals Addressed            This Visit's Progress   . Track and Manage My Blood Pressure-Hypertension       Timeframe:  Long-Range Goal Priority:  High Start Date:        08/05/20                     Expected End Date:       09/20/20             Follow Up Date 09/20/20   - check blood pressure daily - choose a place to take my blood pressure (home, clinic or office, retail store) - write blood pressure results in a log or diary  -Get OMRON blood pressure cuff at [facility]   Why is this important?    You won't feel high blood pressure, but it can still hurt your blood vessels.   High blood pressure can cause heart or kidney problems. It can also cause a stroke.   Making lifestyle changes like losing a little weight or eating less salt will help.   Checking your blood pressure at home and at different times of the day can help to control blood pressure.   If the doctor prescribes medicine remember to take it the way the doctor ordered.   Call the office if you cannot afford the medicine or if there are questions about it.     Notes:       Patient Care Plan: CCM Pharmacy Care Plan    Problem Identified: Hypertension, Hyperlipidemia, GERD, Anxiety, Osteoporosis and Tobacco use   Priority: High    Long-Range Goal: Disease management   Start Date: 08/05/2020  Expected End Date: 02/04/2021  This Visit's Progress: On track  Priority: High  Note:   Current Barriers:  . Unable to independently monitor therapeutic efficacy . Unable to maintain control of anxiety  Pharmacist Clinical Goal(s):  Marland Kitchen. Patient will achieve adherence to monitoring guidelines and medication adherence to achieve therapeutic efficacy . maintain control  of anxiety as evidenced by patient report  through collaboration with PharmD and provider.   Interventions: . 1:1 collaboration with Pincus SanesBurns, Stacy J, MD regarding development and update of comprehensive plan of care as evidenced by provider attestation and co-signature . Inter-disciplinary care team collaboration (see longitudinal plan of care) . Comprehensive medication review performed; medication list updated in electronic medical record  Hypertension (BP goal <140/90) -Unknown control - pt is checking BP on wrist cuff at home and is unsure of accuracy; most recent office BP was relatively controlled -Current treatment: . Amlodipine 5 mg daily HS -Current home readings: 155/92 (before coffee) -Denies hypotensive/hypertensive symptoms -Educated on BP goals and benefits of medications for prevention of heart attack, stroke and kidney damage; Importance of home blood pressure monitoring; Proper BP monitoring technique; -Counseled to monitor BP at home daily, document, and provide log at future appointments -Recommended to continue current medication  Hyperlipidemia / PAD: (LDL goal < 70) -Not ideally controlled - statin was increased at last visit to achieve LDL goal; plan to recheck cholesterol panel at next PCP visit -Current treatment: . Atorvastatin 40 mg daily HS . Cilostazol 50 mg BID HS . Aspirin 81 mg  daily -Current dietary patterns: doesn't eat meat -Current exercise habits: limited due to back pain; does  -Educated on Cholesterol goals;  Benefits of statin for ASCVD risk reduction; -Recommended to continue current medication  Depression/Anxiety (Goal: manage symptoms) -Not ideally controlled - pt started sertraline about a month ago, has not noticed much difference yet. Pt uses alprazolam sparingly for sleep, but not often as she usually doesn't have problems with sleep -Current treatment: . Sertraline 25 mg daily . Alprazolam 0.25 mg HS prn -Medications previously  tried/failed: paroxetine -PHQ9: 0 -GAD7: not on file -Connected with PCP for mental health support -Counseled on potential side effects of sertraline (pt denies) -Recommend to increase sertraline to 50 mg daily  Tobacco use (Goal: reduce cigarettes) -Uncontrolled  -30 pack-year history, 1/2 ppd -Previous quit attempts: once quit for 6 months;  -Patient smokes After 30 minutes of waking  -Patient triggers include: drinking coffee -Counseled on benefits of smoking cessation; pt acknowledges she would like to quit smoking but is not currently ready to set a quit date  Osteoporosis (Goal: prevent fractures) -Controlled -Last DEXA Scan: 11/05/2018   T-Score femoral neck: -3.0  T-Score lumbar spine: +0.6 -Patient is a candidate for pharmacologic treatment due to T-Score < -2.5 in femoral neck -Current treatment  . Prolia q6 months (last given 03/24/2020) . Calcium-Vitamin D 500-200 mg-IU - 2 tab daily . Vitamin D 2000 IU daily -Recommend [phone] units of vitamin D daily. Recommend 1200 mg of calcium daily from dietary and supplemental sources. -Recommended to continue current medication  GERD (Goal: manage symptoms) -Controlled - Patient is satisfied with current regimen and denies issues -Current treatment  . Omeprazole 40 mg daily -Recommended to continue current medication  Health Maintenance -Vaccine gaps: Shingrix -Current therapy:  . Klor-con 20 meq daily . Probiotic  -Pt reports Klor Con tablets are too big to swallow, she has been cutting into quarters -Recommend switching to Klor Con 10 mEq x 2 daily to ease swallowing issue  Patient Goals/Self-Care Activities . Patient will:  - take medications as prescribed focus on medication adherence by routine check blood pressure daily, document, and provide at future appointments  Follow Up Plan: Telephone follow up appointment with care management team member scheduled for: 1 month      Jennifer [name] was given information  about Chronic Care Management services today including:  1. CCM service includes personalized support from designated clinical staff supervised by her physician, including individualized plan of care and coordination with other care providers 2. 24/7 contact phone numbers for assistance for urgent and routine care needs. 3. Standard insurance, coinsurance, copays and deductibles apply for chronic care management only during months in which we provide at least 20 minutes of these services. Most insurances cover these services at 100%, however patients may be responsible for any copay, coinsurance and/or deductible if applicable. This service may help you avoid the need for more expensive face-to-face services. 4. Only one practitioner may furnish and bill the service in a calendar month. 5. The patient may stop CCM services at any time (effective at the end of the month) by phone call to the office staff.  Patient agreed to services and verbal consent obtained.   Patient verbalizes understanding of instructions provided today and agrees to view in MyChart.  Telephone follow up appointment with pharmacy team member scheduled for: 1 month  Al Corpus, PharmD, [place], CPP Clinical Pharmacist [facility] [phone]  How to Take Your Blood Pressure Blood  pressure is a measurement of how strongly your blood is pressing against the walls of your arteries. Arteries are blood vessels that carry blood from your heart throughout your body. Your health care provider takes your blood pressure at each office visit. You can also take your own blood pressure at home with a blood pressure monitor. You may need to take your own blood pressure to:  Confirm a diagnosis of high blood pressure (hypertension).  Monitor your blood pressure over time.  Make sure your blood pressure medicine is working. Supplies needed:  Blood pressure monitor.  Dining room chair to sit  in.  Table or desk.  Small notebook and pencil or pen. How to prepare To get the most accurate reading, avoid the following for 30 minutes before you check your blood pressure:  Drinking caffeine.  Drinking alcohol.  Eating.  Smoking.  Exercising. Five minutes before you check your blood pressure:  Use the bathroom and urinate so that you have an empty bladder.  Sit quietly in a dining room chair. Do not sit in a soft couch or an armchair. Do not talk. How to take your blood pressure To check your blood pressure, follow the instructions in the manual that came with your blood pressure monitor. If you have a digital blood pressure monitor, the instructions may be as follows: 1. Sit up straight in a chair. 2. Place your feet on the floor. Do not cross your ankles or legs. 3. Rest your left arm at the level of your heart on a table or desk or on the arm of a chair. 4. Pull up your shirt sleeve. 5. Wrap the blood pressure cuff around the upper part of your left arm, 1 inch (2.5 cm) above your elbow. It is best to wrap the cuff around bare skin. 6. Fit the cuff snugly around your arm. You should be able to place only one finger between the cuff and your arm. 7. Position the cord so that it rests in the bend of your elbow. 8. Press the power button. 9. Sit quietly while the cuff inflates and deflates. 10. Read the digital reading on the monitor screen and write the numbers down (record them) in a notebook. 11. Wait 2-3 minutes, then repeat the steps, starting at step 1.   What does my blood pressure reading mean? A blood pressure reading consists of a higher number over a lower number. Ideally, your blood pressure should be below 120/80. The first ("top") number is called the systolic pressure. It is a measure of the pressure in your arteries as your heart beats. The second ("bottom") number is called the diastolic pressure. It is a measure of the pressure in your arteries as the heart  relaxes. Blood pressure is classified into five stages. The following are the stages for adults who do not have a short-term serious illness or a chronic condition. Systolic pressure and diastolic pressure are measured in a unit called mm Hg (millimeters of mercury).  Normal  Systolic pressure: below 120.  Diastolic pressure: below 80. Elevated  Systolic pressure: 120-129.  Diastolic pressure: below 80. Hypertension stage 1  Systolic pressure: 130-139.  Diastolic pressure: 80-89. Hypertension stage 2  Systolic pressure: 140 or above.  Diastolic pressure: 90 or above. You can have elevated blood pressure or hypertension even if only the systolic or only the diastolic number in your reading is higher than normal. Follow these instructions at home:  Check your blood pressure as often as recommended by  your health care provider.  Check your blood pressure at the same time every day.  Take your monitor to the next appointment with your health care provider to make sure that: ? You are using it correctly. ? It provides accurate readings.  Be sure you understand what your goal blood pressure numbers are.  Tell your health care provider if you are having any side effects from blood pressure medicine.  Keep all follow-up visits as told by your health care provider. This is important. General tips  Your health care provider can suggest a reliable monitor that will meet your needs. There are several types of home blood pressure monitors.  Choose a monitor that has an arm cuff. Do not choose a monitor that measures your blood pressure from your wrist or finger.  Choose a cuff that wraps snugly around your upper arm. You should be able to fit only one finger between your arm and the cuff.  You can buy a blood pressure monitor at most drugstores or online. Where to find more information American Heart Association: www.heart.org Contact a health care provider if:  Your blood  pressure is consistently high. Get help right away if:  Your systolic blood pressure is higher than 180.  Your diastolic blood pressure is higher than 120. Summary  Blood pressure is a measurement of how strongly your blood is pressing against the walls of your arteries.  A blood pressure reading consists of a higher number over a lower number. Ideally, your blood pressure should be below 120/80.  Check your blood pressure at the same time every day.  Avoid caffeine, alcohol, smoking, and exercise for 30 minutes prior to checking your blood pressure. These agents can affect the accuracy of the blood pressure reading. This information is not intended to replace advice given to you by your health care provider. Make sure you discuss any questions you have with your health care provider. Document Revised: 04/04/2019 Document Reviewed: 04/04/2019 Elsevier Patient Education  2021 [employer].

## 2020-08-19 ENCOUNTER — Other Ambulatory Visit: Payer: Self-pay | Admitting: Internal Medicine

## 2020-08-20 NOTE — Progress Notes (Signed)
    Chronic Care Management Pharmacy Assistant   Name: Jennifer [name]  MRN: [id number] DOB: 1940-12-25    Reason for Encounter: Chart Review    Medications: Outpatient Encounter Medications as of 08/04/2020  Medication Sig  . amLODipine (NORVASC) 5 MG tablet Take 1 tablet (5 mg total) by mouth daily.  Marland Kitchen aspirin 81 MG tablet Take 81 mg by mouth daily before breakfast.  . atorvastatin (LIPITOR) 40 MG tablet Take 1 tablet (40 mg total) by mouth daily.  . calcium-vitamin D (OSCAL WITH D) 500-200 MG-UNIT per tablet Take 2 tablets by mouth daily.  . Cholecalciferol (VITAMIN D) 50 MCG (2000 UT) CAPS Take by mouth.  . cilostazol (PLETAL) 50 MG tablet TAKE 1 TABLET(50 MG) BY MOUTH TWICE DAILY  . omeprazole (PRILOSEC) 40 MG capsule Take 1 capsule (40 mg total) by mouth daily.  . potassium chloride SA (KLOR-CON) 20 MEQ tablet TAKE 1 TABLET(20 MEQ) BY MOUTH DAILY  . [DISCONTINUED] ALPRAZolam (XANAX) 0.25 MG tablet TAKE 1 TABLET BY MOUTH EVERY NIGHT AT BEDTIME AS NEEDED FOR ANXIETY OR SLEEP  . [DISCONTINUED] sertraline (ZOLOFT) 25 MG tablet Take 1 tablet (25 mg total) by mouth daily.   No facility-administered encounter medications on file as of 08/04/2020.    Reviewed chart for medication changes and adherence.   No gaps in adherence identified. Patient has follow up scheduled with pharmacy team. No further action required.  Velvet Bathe Clinical Pharmacist Assistant [phone]  Time spent:4

## 2020-08-23 ENCOUNTER — Ambulatory Visit (INDEPENDENT_AMBULATORY_CARE_PROVIDER_SITE_OTHER): Payer: Medicare Other | Admitting: Pharmacist

## 2020-08-23 ENCOUNTER — Other Ambulatory Visit: Payer: Self-pay

## 2020-08-23 DIAGNOSIS — F411 Generalized anxiety disorder: Secondary | ICD-10-CM

## 2020-08-23 DIAGNOSIS — E876 Hypokalemia: Secondary | ICD-10-CM

## 2020-08-23 DIAGNOSIS — E785 Hyperlipidemia, unspecified: Secondary | ICD-10-CM | POA: Diagnosis not present

## 2020-08-23 DIAGNOSIS — Z720 Tobacco use: Secondary | ICD-10-CM

## 2020-08-23 DIAGNOSIS — M810 Age-related osteoporosis without current pathological fracture: Secondary | ICD-10-CM | POA: Diagnosis not present

## 2020-08-23 DIAGNOSIS — I10 Essential (primary) hypertension: Secondary | ICD-10-CM | POA: Diagnosis not present

## 2020-08-23 MED ORDER — POTASSIUM CHLORIDE CRYS ER 10 MEQ PO TBCR: 20.0000 meq | EXTENDED_RELEASE_TABLET | Freq: Every day | ORAL | 1 refills | Status: AC

## 2020-08-23 NOTE — Progress Notes (Signed)
Chronic Care Management Pharmacy Note  08/25/2020 Name:  Jennifer [name] MRN:  [id number] DOB:  1941/02/20  Subjective: Jennifer [name] is an 80 y.o. year old female who is a primary patient of Burns, Claudina Lick, MD.  The CCM team was consulted for assistance with disease management and care coordination needs.    Engaged with patient by telephone for follow up visit in response to provider referral for pharmacy case management and/or care coordination services.   Consent to Services:  The patient was given information about Chronic Care Management services, agreed to services, and gave verbal consent prior to initiation of services.  Please see initial visit note for detailed documentation.   Patient Care Team: Binnie Rail, MD as PCP - General (Internal Medicine) Burnell Blanks, MD as PCP - Cardiology (Cardiology) Irene Shipper, MD as Consulting Physician (Gastroenterology) Charlton Haws, [facility] as Pharmacist (Pharmacist)  Patient lives at home alone. 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  Recent office visits: 07/05/20 Dr Quay Burow OV: chronic f/u, c/o anxiety attacks. Started sertraline 25 mg. Increased atorvastatin to 40 mg.  Recent consult visits: 04/01/20 Dr Henrene Pastor (GI): s/p endoscopy, hx esophageal dilation 12/2017. No med changes, continue PPI.  03/03/20 Dr Angelena Form (cardiology): f/u PAD, ABI stable. Increased amlodipine to 5 mg.  Hospital visits: None in previous 6 months  Objective:  Lab Results  Component Value Date   CREATININE 0.97 07/05/2020   BUN 14 07/05/2020   GFR 54.66 (L) 07/05/2020   GFRNONAA >60 07/16/2019   GFRAA >60 07/16/2019   NA 132 (L) 07/05/2020   K 3.5 07/05/2020   CALCIUM 9.2 07/05/2020   CO2 25 07/05/2020   GLUCOSE 95 07/05/2020    Lab Results  Component Value Date/Time   HGBA1C 6.0 07/05/2020 02:36 PM   HGBA1C 5.9 12/09/2019 02:33 PM   GFR 54.66 (L) 07/05/2020 02:36 PM   GFR 60.83 12/09/2019 02:33 PM     Last diabetic Eye exam: No results found for: HMDIABEYEEXA  Last diabetic Foot exam: No results found for: HMDIABFOOTEX   Lab Results  Component Value Date   CHOL 160 07/05/2020   HDL 59.70 07/05/2020   LDLCALC 86 07/05/2020   LDLDIRECT 232.1 08/09/2009   TRIG 71.0 07/05/2020   CHOLHDL 3 07/05/2020    Hepatic Function Latest Ref Rng & Units 07/05/2020 12/09/2019 07/09/2019  Total Protein 6.0 - 8.3 g/dL 7.8 7.4 6.2(L)  Albumin 3.5 - 5.2 g/dL 3.6 3.8 2.6(L)  AST 0 - 37 U/L _0 ALT 0 - 35 U/L _1 Alk Phosphatase 39 - 117 U/L 49 58 81  Total Bilirubin 0.2 - 1.2 mg/dL 0.5 0.3 0.6  Bilirubin, Direct 0.0 - 0.3 mg/dL - - -    Lab Results  Component Value Date/Time   TSH 3.63 11/01/2018 03:55 PM   TSH 3.40 05/04/2017 03:02 PM    CBC Latest Ref Rng & Units 07/05/2020 12/09/2019 07/16/2019  WBC 4.0 - 10.5 K/uL 14.8(H) 8.5 7.1  Hemoglobin 12.0 - 15.0 g/dL 10.4(L) 10.7 Repeated and verified X2.(L) 9.9(L)  Hematocrit 36.0 - 46.0 % 31.8(L) 32.3(L) 31.8(L)  Platelets 150.0 - 400.0 K/uL 382.0 230.0 403(H)    Lab Results  Component Value Date/Time   VD25OH 54.33 07/05/2020 02:36 PM   VD25OH 32.64 12/09/2019 02:33 PM    Clinical ASCVD: Yes  The ASCVD Risk score Mikey Bussing DC Jr., et al., 2013) failed to calculate for the following reasons:  The 2013 ASCVD risk score is only valid for ages 71 to 53    Depression screen PHQ 2/9 07/05/2020 11/01/2018 10/31/2017  Decreased Interest 0 0 0  Down, Depressed, Hopeless 0 0 0  PHQ - 2 Score 0 0 0     Social History   Tobacco Use  Smoking Status Light Tobacco Smoker  . Packs/day: 0.50  . Years: 62.00  . Pack years: 31.00  . Types: Cigarettes  . Last attempt to quit: 07/15/2019  . Years since quitting: 1.1  Smokeless Tobacco Never Used   BP Readings from Last 3 Encounters:  07/05/20 (!) 142/76  04/01/20 140/70  03/03/20 (!) 144/70   Pulse Readings from Last 3 Encounters:  07/05/20 86  04/01/20 79  03/03/20 86   Wt Readings  from Last 3 Encounters:  07/05/20 93 lb (42.2 kg)  04/01/20 89 lb 9.6 oz (40.6 kg)  03/03/20 91 lb 12.8 oz (41.6 kg)   BMI Readings from Last 3 Encounters:  07/05/20 15.96 kg/m  04/01/20 15.38 kg/m  03/03/20 15.76 kg/m    Assessment/Interventions: Review of patient past medical history, allergies, medications, health status, including review of consultants reports, laboratory and other test data, was performed as part of comprehensive evaluation and provision of chronic care management services.   SDOH:  (Social Determinants of Health) assessments and interventions performed: Yes  SDOH Screenings   Alcohol Screen: Not on file  Depression (PHQ2-9): Low Risk   . PHQ-2 Score: 0  Financial Resource Strain: Low Risk   . Difficulty of Paying Living Expenses: Not hard at all  Food Insecurity: Not on file  Housing: Not on file  Physical Activity: Not on file  Social Connections: Not on file  Stress: Not on file  Tobacco Use: High Risk  . Smoking Tobacco Use: Light Tobacco Smoker  . Smokeless Tobacco Use: Never Used  Transportation Needs: Not on file    [place]  No Known Allergies  Medications Reviewed Today    Reviewed by Charlton Haws, [facility] (Pharmacist) on 08/23/20 at 58  Med List Status: <None>  Medication Order Taking? Sig Documenting Provider Last Dose Status Informant  ALPRAZolam (XANAX) 0.25 MG tablet [id number] Yes TAKE 1 TABLET BY MOUTH EVERY NIGHT AT BEDTIME AS NEEDED FOR ANXIETY OR SLEEP Burns, Claudina Lick, MD Taking Active   amLODipine (NORVASC) 5 MG tablet [id number]  Take 1 tablet (5 mg total) by mouth daily. Burnell Blanks, MD  Expired 06/01/20 2359   aspirin 81 MG tablet [id number] Yes Take 81 mg by mouth daily before breakfast. [provider] Taking Active Self  atorvastatin (LIPITOR) 40 MG tablet [id number] Yes Take 1 tablet (40 mg total) by mouth daily. Binnie Rail, MD Taking Active   calcium-vitamin D (OSCAL WITH D) 500-200 MG-UNIT  per tablet [id number] Yes Take 2 tablets by mouth daily. [provider] Taking Active Self  Cholecalciferol (VITAMIN D) 50 MCG (2000 UT) CAPS [id number] Yes Take by mouth. [provider] Taking Active   cilostazol (PLETAL) 50 MG tablet [id number] Yes TAKE 1 TABLET(50 MG) BY MOUTH TWICE DAILY Burns, Claudina Lick, MD Taking Active   omeprazole (PRILOSEC) 40 MG capsule [id number] Yes Take 1 capsule (40 mg total) by mouth daily. Irene Shipper, MD Taking Active   potassium chloride SA (KLOR-CON) 20 MEQ tablet [id number] Yes TAKE 1 TABLET(20 MEQ) BY MOUTH DAILY Burns, Claudina Lick, MD Taking Active   sertraline (ZOLOFT) 25 MG tablet [id number] Yes Take 2 tablets (50 mg  total) by mouth daily. Binnie Rail, MD Taking Active           Patient Active Problem List   Diagnosis Date Noted  . Chronic low back pain 07/05/2020  . Aortic atherosclerosis ([place]) 06/15/2020  . Malnutrition ([place]) 09/03/2019  . Sacral insufficiency fracture 08/04/2019  . Vitamin D deficiency 08/04/2019  . Closed compression fracture of L1 vertebra ([place]) 08/01/2019  . Back pain 07/05/2019  . Lumbar radiculopathy 06/27/2019  . Lung nodule 06/10/2019  . Abdominal aortic aneurysm (AAA) ([place]) 06/10/2019  . Chest pain, musculoskeletal 06/10/2019  . Ascending aortic aneurysm ([place]) 06/10/2019  . Hiatal hernia, large 01/09/2018  . GERD (gastroesophageal reflux disease) 12/08/2016  . History of Clostridium difficile infection 11/29/2016  . Prediabetes 11/02/2016  . Carotid arterial disease ([place]) 10/20/2015  . Anxiety state   . PAD (peripheral artery disease) ([place]) 10/10/2011  . Osteoporosis, senile 05/17/2011  . Dyslipidemia 05/03/2009  . Tobacco abuse 04/14/2009  . Essential hypertension 04/14/2009    Immunization History  Administered Date(s) Administered  . Influenza Split 03/08/2011, 01/01/2012  . Influenza, High Dose Seasonal PF 01/13/2014, 01/12/2017, 02/21/2018, 12/23/2018  . Influenza,inj,Quad PF,6+ Mos  01/01/2013, 01/07/2015, 01/23/2019  . Influenza-Unspecified 01/23/2016  . PFIZER(Purple Top)SARS-COV-2 Vaccination 05/30/2019, 06/24/2019  . Pneumococcal Conjugate-13 03/30/2014  . Pneumococcal Polysaccharide-23 07/21/2013  . Td 07/21/2013    Conditions to be addressed/monitored:  Hypertension, Hyperlipidemia, GERD, Anxiety, Osteoporosis and Tobacco use  Patient Care Plan: CCM Pharmacy Care Plan    Problem Identified: Hypertension, Hyperlipidemia, GERD, Anxiety, Osteoporosis and Tobacco use   Priority: High    Long-Range Goal: Disease management   Start Date: 08/05/2020  Expected End Date: 02/04/2021  Recent Progress: On track  Priority: High  Note:   Current Barriers:  . Unable to independently monitor therapeutic efficacy . Unable to maintain control of anxiety  Pharmacist Clinical Goal(s):  Marland Kitchen Patient will achieve adherence to monitoring guidelines and medication adherence to achieve therapeutic efficacy . maintain control of anxiety as evidenced by patient report  through collaboration with PharmD and provider.   Interventions: . 1:1 collaboration with Binnie Rail, MD regarding development and update of comprehensive plan of care as evidenced by provider attestation and co-signature . Inter-disciplinary care team collaboration (see longitudinal plan of care) . Comprehensive medication review performed; medication list updated in electronic medical record  Hypertension (BP goal <140/90) -Unknown control - pt is checking BP on wrist cuff at home and is unsure of accuracy; most recent office BP was relatively controlled -Current treatment: . Amlodipine 5 mg daily HS -Current home readings: 155/92 (before coffee, wrist cuff) -Denies hypotensive/hypertensive symptoms -Educated on BP goals and benefits of medications for prevention of heart attack, stroke and kidney damage; Importance of home blood pressure monitoring; Proper BP monitoring technique (benefits of arm cuff  over wrist cuff) -Counseled to monitor BP at home daily, document, and provide log at future appointments -Recommended to get an Om[employer] arm BP monitor and continue current medication  Hyperlipidemia / PAD: (LDL goal < 70) -Not ideally controlled - statin was increased at last visit to achieve LDL goal; plan to recheck cholesterol panel at next PCP visit -Current treatment: . Atorvastatin 40 mg daily HS . Cilostazol 50 mg BID HS . Aspirin 81 mg daily -Current dietary patterns: doesn't eat meat -Current exercise habits: limited due to back pain; does  -Educated on Cholesterol goals;  Benefits of statin for ASCVD risk reduction; -Recommended to continue current medication  Depression/Anxiety (Goal: manage symptoms) -Not  ideally controlled - pt increased sertraline to 50 mg about 3 weeks ago, she has not noticed significant improvement yet -Current treatment: . Sertraline 25 mg - 2 tab daily . Alprazolam 0.25 mg HS prn -Medications previously tried/failed: paroxetine -PHQ9: 0 -GAD7: not on file -Connected with PCP for mental health support -Counseled on potential side effects of sertraline (pt denies); counseled on trial period of 6-8 weeks for SSRI -Recommend to continue sertraline for at least 6-8 weeks to see full benefit  Tobacco use (Goal: reduce cigarettes) -Uncontrolled  -30 pack-year history, 1/2 ppd -Previous quit attempts: once quit for 6 months;  -Patient smokes After 30 minutes of waking  -Patient triggers include: drinking coffee -Counseled on benefits of smoking cessation; pt acknowledges she would like to quit smoking but is not currently ready to set a quit date  Osteoporosis (Goal: prevent fractures) -Controlled -Last DEXA Scan: 11/05/2018   T-Score femoral neck: -3.0  T-Score lumbar spine: +0.6 -Patient is a candidate for pharmacologic treatment due to T-Score < -2.5 in femoral neck -Current treatment  . Prolia q6 months (last given 03/24/2020) . Calcium-Vitamin  D 500-200 mg-IU - 2 tab daily . Vitamin D 2000 IU daily -Recommend [phone] units of vitamin D daily. Recommend 1200 mg of calcium daily from dietary and supplemental sources. -Recommended to continue current medication; advised pt to schedule next Prolia injection after 09/22/20  Health Maintenance -Vaccine gaps: Shingrix -Current therapy:  . Klor-con 20 meq daily . Probiotic  -Pt reports Klor Con tablets are too big to swallow, she has been cutting into quarters -Plan: switched Klor Con to 10 mEq x 2 daily to ease swallowing issue  Patient Goals/Self-Care Activities . Patient will:  - take medications as prescribed focus on medication adherence by routine check blood pressure daily, document, and provide at future appointments  Follow Up Plan: Telephone follow up appointment with care management team member scheduled for: 2 months     Medication Assistance: None required.  Patient affirms current coverage meets needs.  Patient's preferred pharmacy is:  [id number] [address], [address] [place] [address] Phone: [phone] Fax: [phone]  Uses pill box? Yes Pt endorses 100% compliance  We discussed: Current pharmacy is preferred with insurance plan and patient is satisfied with pharmacy services Patient decided to: Continue current medication management strategy  Care Plan and Follow Up Patient Decision:  Patient agrees to Care Plan and Follow-up.  Plan: Telephone follow up appointment with care management team member scheduled for:  2 months  Charlene Brooke, PharmD, [place], CPP Clinical Pharmacist [facility] at [facility] [phone]

## 2020-08-25 NOTE — Patient Instructions (Signed)
Visit Information  Phone number for Pharmacist: [phone]  Goals Addressed            This Visit's Progress   . Track and Manage My Blood Pressure-Hypertension       Timeframe:  Long-Range Goal Priority:  High Start Date:        08/05/20                     Expected End Date:       11/20/20            Follow Up Date 11/20/20   - check blood pressure daily - choose a place to take my blood pressure (home, clinic or office, retail store) - write blood pressure results in a log or diary  -Get OMRON blood pressure cuff at [facility]   Why is this important?    You won't feel high blood pressure, but it can still hurt your blood vessels.   High blood pressure can cause heart or kidney problems. It can also cause a stroke.   Making lifestyle changes like losing a little weight or eating less salt will help.   Checking your blood pressure at home and at different times of the day can help to control blood pressure.   If the doctor prescribes medicine remember to take it the way the doctor ordered.   Call the office if you cannot afford the medicine or if there are questions about it.     Notes:       Patient verbalizes understanding of instructions provided today and agrees to view in MyChart.  Telephone follow up appointment with pharmacy team member scheduled for: 2 months  Al Corpus, PharmD, [place], CPP Clinical Pharmacist [facility] at [facility] [phone]

## 2020-09-06 NOTE — Progress Notes (Signed)
Subjective:    Patient ID: Jennifer [name], female    DOB: Oct 26, 1940, 80 y.o.   MRN: [id number]  HPI The patient is here for an acute visit.   Dizzy, unbalanced -  It started one week ago.  It has not gotten better.  She gets dizziness when she gets up in the morning and bumps into the walls. She is very unbalanced. It is intermittent.  She notices only with changes in head position - only with head extension - not looking down or side to side.  She gets a spinning sensation with looking back wards/head extension.  She has associated nausea.    Weight loss - she is barely eating.  She has a h/o anorexia and has struggled with her weight.  She has dec appetite and has not been eating well.     Medications and allergies reviewed with patient and updated if appropriate.  Patient Active Problem List   Diagnosis Date Noted  . Chronic low back pain 07/05/2020  . Aortic atherosclerosis (HCC) 06/15/2020  . Malnutrition (HCC) 09/03/2019  . Sacral insufficiency fracture 08/04/2019  . Vitamin D deficiency 08/04/2019  . Closed compression fracture of L1 vertebra (HCC) 08/01/2019  . Back pain 07/05/2019  . Lumbar radiculopathy 06/27/2019  . Lung nodule 06/10/2019  . Abdominal aortic aneurysm (AAA) (HCC) 06/10/2019  . Chest pain, musculoskeletal 06/10/2019  . Ascending aortic aneurysm (HCC) 06/10/2019  . Hiatal hernia, large 01/09/2018  . GERD (gastroesophageal reflux disease) 12/08/2016  . History of Clostridium difficile infection 11/29/2016  . Prediabetes 11/02/2016  . Carotid arterial disease (HCC) 10/20/2015  . Anxiety state   . PAD (peripheral artery disease) (HCC) 10/10/2011  . Osteoporosis, senile 05/17/2011  . Dyslipidemia 05/03/2009  . Tobacco abuse 04/14/2009  . Essential hypertension 04/14/2009    Current Outpatient Medications on File Prior to Visit  Medication Sig Dispense Refill  . ALPRAZolam (XANAX) 0.25 MG tablet TAKE 1 TABLET BY MOUTH EVERY NIGHT AT BEDTIME AS  NEEDED FOR ANXIETY OR SLEEP 30 tablet 2  . aspirin 81 MG tablet Take 81 mg by mouth daily before breakfast.    . atorvastatin (LIPITOR) 40 MG tablet Take 1 tablet (40 mg total) by mouth daily. 90 tablet 3  . calcium-vitamin D (OSCAL WITH D) 500-200 MG-UNIT per tablet Take 2 tablets by mouth daily.    . Cholecalciferol (VITAMIN D) 50 MCG (2000 UT) CAPS Take by mouth.    . cilostazol (PLETAL) 50 MG tablet TAKE 1 TABLET(50 MG) BY MOUTH TWICE DAILY 180 tablet 2  . omeprazole (PRILOSEC) 40 MG capsule Take 1 capsule (40 mg total) by mouth daily. 90 capsule 3  . potassium chloride SA (KLOR-CON) 10 MEQ tablet Take 2 tablets (20 mEq total) by mouth daily. 180 tablet 1  . sertraline (ZOLOFT) 25 MG tablet Take 2 tablets (50 mg total) by mouth daily. 60 tablet 5  . amLODipine (NORVASC) 5 MG tablet Take 1 tablet (5 mg total) by mouth daily. 180 tablet 3   No current facility-administered medications on file prior to visit.    Past Medical History:  Diagnosis Date  . ANEMIA-NOS   . Cataract    right cataract removed  . Clotting disorder (HCC)    Pletal  . Depression    no longer taking meds/ resolved  . DYSLIPIDEMIA   . GERD (gastroesophageal reflux disease)   . Hemorrhoids   . HYPERTENSION   . Negative colorectal cancer screening using Cologuard test   . Osteoporosis,  senile    DEXA 03/2014: -3.3 R fem  . Peripheral vascular disease, unspecified (HCC) dx 02/2010 ABI  . SMOKER     Past Surgical History:  Procedure Laterality Date  . CATARACT EXTRACTION W/ INTRAOCULAR LENS IMPLANT Right 03/12/14   groat  . HEMORRHOID SURGERY  05/18/2011   Procedure: HEMORRHOIDECTOMY;  Surgeon: Robyne Askew, MD;  Location: [facility];  Service: General;  Laterality: N/A;  hemorrhoidectomy, three columns, internal and external  . IR KYPHO LUMBAR INC FX REDUCE BONE BX UNI/BIL CANNULATION INC/IMAGING  07/11/2019  . TONSILLECTOMY  1946  . TUBAL LIGATION  1979  . UPPER GASTROINTESTINAL ENDOSCOPY  01/03/2018     Social History   Socioeconomic History  . Marital status: Single    Spouse name: Not on file  . Number of children: Not on file  . Years of education: Not on file  . Highest education level: Not on file  Occupational History  . Occupation: retired  Tobacco Use  . Smoking status: Light Tobacco Smoker    Packs/day: 0.50    Years: 62.00    Pack years: 31.00    Types: Cigarettes    Last attempt to quit: 07/15/2019    Years since quitting: 1.1  . Smokeless tobacco: Never Used  Vaping Use  . Vaping Use: Never used  Substance and Sexual Activity  . Alcohol use: Yes    Alcohol/week: 0.0 standard drinks    Comment: Social- 1 scotch, 1 glass wine weekly  . Drug use: No  . Sexual activity: Not Currently    Birth control/protection: Post-menopausal  Other Topics Concern  . Not on file  Social History Narrative   Single, lives alone. Retired Pensions consultant. Enjoys Agricultural consultant work.   Social Determinants of Health   Financial Resource Strain: Low Risk   . Difficulty of Paying Living Expenses: Not hard at all  Food Insecurity: Not on file  Transportation Needs: Not on file  Physical Activity: Not on file  Stress: Not on file  Social Connections: Not on file    Family History  Problem Relation Age of Onset  . Breast cancer Mother   . Lung cancer Father   . Hypertension Brother   . Colon cancer Neg Hx   . Esophageal cancer Neg Hx   . Stomach cancer Neg Hx   . Rectal cancer Neg Hx     Review of Systems  Constitutional: Negative for fever.  HENT:       Mild sinus symptoms - typical for this time of year  Eyes: Negative for visual disturbance.  Gastrointestinal: Positive for nausea.  Neurological: Positive for dizziness. Negative for weakness, numbness and headaches.       Objective:   Vitals:   09/07/20 1411  BP: 138/80  Pulse: 62  Temp: 98 F (36.7 C)  SpO2: 97%   BP Readings from Last 3 Encounters:  09/07/20 138/80  07/05/20 (!) 142/76  04/01/20  140/70   Wt Readings from Last 3 Encounters:  09/07/20 85 lb (38.6 kg)  07/05/20 93 lb (42.2 kg)  04/01/20 89 lb 9.6 oz (40.6 kg)   Body mass index is 14.59 kg/m.   Physical Exam Constitutional:      General: She is not in acute distress.    Appearance: She is not ill-appearing.     Comments: Thin, frail and chronically ill appearing  HENT:     Head: Normocephalic and atraumatic.     Right Ear: Tympanic membrane, ear canal and external  ear normal.     Left Ear: Tympanic membrane, ear canal and external ear normal.  Eyes:     Conjunctiva/sclera: Conjunctivae normal.  Cardiovascular:     Rate and Rhythm: Normal rate and regular rhythm.     Heart sounds: No murmur heard.   Pulmonary:     Effort: Pulmonary effort is normal. No respiratory distress.     Breath sounds: No wheezing or rales.  Abdominal:     General: There is no distension.     Palpations: Abdomen is soft.     Tenderness: There is no abdominal tenderness. There is no guarding or rebound.  Musculoskeletal:     Cervical back: Neck supple. No tenderness.     Right lower leg: No edema.     Left lower leg: No edema.     Comments: Thoracic kyphosis  Lymphadenopathy:     Cervical: No cervical adenopathy.  Skin:    General: Skin is warm and dry.  Neurological:     General: No focal deficit present.     Mental Status: She is oriented to person, place, and time.     Sensory: No sensory deficit.     Motor: No weakness.     Gait: Gait abnormal.       Lab Results  Component Value Date   WBC 9.6 09/07/2020   HGB 10.7 (L) 09/07/2020   HCT 32.0 (L) 09/07/2020   PLT 159.0 09/07/2020   GLUCOSE 102 (H) 09/07/2020   CHOL 160 07/05/2020   TRIG 71.0 07/05/2020   HDL 59.70 07/05/2020   LDLDIRECT 232.1 08/09/2009   LDLCALC 86 07/05/2020   ALT 6 09/07/2020   AST 16 09/07/2020   NA 134 (L) 09/07/2020   K 3.3 (L) 09/07/2020   CL 101 09/07/2020   CREATININE 1.01 09/07/2020   BUN 13 09/07/2020   CO2 23 09/07/2020    TSH 4.02 09/07/2020   INR 1.1 07/10/2019   HGBA1C 6.0 07/05/2020        Assessment & Plan:    See Problem List for Assessment and Plan of chronic medical problems.    This visit occurred during the SARS-CoV-2 public health emergency.  Safety protocols were in place, including screening questions prior to the visit, additional usage of staff PPE, and extensive cleaning of exam room while observing appropriate contact time as indicated for disinfecting solutions.

## 2020-09-07 ENCOUNTER — Other Ambulatory Visit: Payer: Self-pay

## 2020-09-07 ENCOUNTER — Ambulatory Visit (INDEPENDENT_AMBULATORY_CARE_PROVIDER_SITE_OTHER): Payer: Medicare Other | Admitting: Internal Medicine

## 2020-09-07 ENCOUNTER — Encounter: Payer: Self-pay | Admitting: Internal Medicine

## 2020-09-07 VITALS — BP 138/80 | HR 62 | Temp 98.0°F | Ht 64.0 in | Wt 85.0 lb

## 2020-09-07 DIAGNOSIS — R269 Unspecified abnormalities of gait and mobility: Secondary | ICD-10-CM | POA: Diagnosis not present

## 2020-09-07 DIAGNOSIS — R911 Solitary pulmonary nodule: Secondary | ICD-10-CM

## 2020-09-07 DIAGNOSIS — R42 Dizziness and giddiness: Secondary | ICD-10-CM

## 2020-09-07 DIAGNOSIS — R0602 Shortness of breath: Secondary | ICD-10-CM | POA: Insufficient documentation

## 2020-09-07 DIAGNOSIS — R634 Abnormal weight loss: Secondary | ICD-10-CM

## 2020-09-07 LAB — CBC WITH DIFFERENTIAL/PLATELET
Basophils Absolute: 0.1 10*3/uL (ref 0.0–0.1)
Basophils Relative: 0.8 % (ref 0.0–3.0)
Eosinophils Absolute: 0.1 10*3/uL (ref 0.0–0.7)
Eosinophils Relative: 0.8 % (ref 0.0–5.0)
HCT: 32 % — ABNORMAL LOW (ref 36.0–46.0)
Hemoglobin: 10.7 g/dL — ABNORMAL LOW (ref 12.0–15.0)
Lymphocytes Relative: 18.5 % (ref 12.0–46.0)
Lymphs Abs: 1.8 10*3/uL (ref 0.7–4.0)
MCHC: 33.5 g/dL (ref 30.0–36.0)
MCV: 76.8 fl — ABNORMAL LOW (ref 78.0–100.0)
Monocytes Absolute: 0.8 10*3/uL (ref 0.1–1.0)
Monocytes Relative: 8 % (ref 3.0–12.0)
Neutro Abs: 6.9 10*3/uL (ref 1.4–7.7)
Neutrophils Relative %: 71.9 % (ref 43.0–77.0)
Platelets: 159 10*3/uL (ref 150.0–400.0)
RBC: 4.16 Mil/uL (ref 3.87–5.11)
RDW: 16.2 % — ABNORMAL HIGH (ref 11.5–15.5)
WBC: 9.6 10*3/uL (ref 4.0–10.5)

## 2020-09-07 LAB — COMPREHENSIVE METABOLIC PANEL
ALT: 6 U/L (ref 0–35)
AST: 16 U/L (ref 0–37)
Albumin: 3.6 g/dL (ref 3.5–5.2)
Alkaline Phosphatase: 70 U/L (ref 39–117)
BUN: 13 mg/dL (ref 6–23)
CO2: 23 mEq/L (ref 19–32)
Calcium: 8.7 mg/dL (ref 8.4–10.5)
Chloride: 101 mEq/L (ref 96–112)
Creatinine, Ser: 1.01 mg/dL (ref 0.40–1.20)
GFR: 52.01 mL/min — ABNORMAL LOW (ref >60.00)
Glucose, Bld: 102 mg/dL — ABNORMAL HIGH (ref 70–99)
Potassium: 3.3 mEq/L — ABNORMAL LOW (ref 3.5–5.1)
Sodium: 134 mEq/L — ABNORMAL LOW (ref 135–145)
Total Bilirubin: 0.5 mg/dL (ref 0.2–1.2)
Total Protein: 7.5 g/dL (ref 6.0–8.3)

## 2020-09-07 LAB — TSH: TSH: 4.02 u[IU]/mL (ref 0.35–4.50)

## 2020-09-07 MED ORDER — ESCITALOPRAM OXALATE 5 MG PO TABS: 5.0000 mg | ORAL_TABLET | Freq: Every day | ORAL | 5 refills | Status: DC

## 2020-09-07 NOTE — Assessment & Plan Note (Signed)
Acute on chronic H/o  Anorexia Not eating much and not trying to eat more Discussed boost, ensure Discussed importance of eating protein - currently eating grits, crackers, cookies F/u in 3 weeks

## 2020-09-07 NOTE — Assessment & Plan Note (Signed)
Present on Ct 05/2019 - RUL At that time she was undergoing evaluation for AAA and asc aortic aneurysm and this was not able to be evaluated further Stressed smoking cessation Ct chest w/o contrast referral to pulmonary

## 2020-09-07 NOTE — Assessment & Plan Note (Addendum)
Acute Started one week ago Only with extension of neck MRI of head to r/o stroke, vertebrobasilar artery syndrome Can try non-drowsy dramamine Avoid neck extension

## 2020-09-07 NOTE — Patient Instructions (Addendum)
  Blood work was ordered.     Medications changes include :   Start lexapro 5 mg daily  Your prescription(s) have been submitted to your pharmacy. Please take as directed and contact our office if you believe you are having problem(s) with the medication(s).   An MRI of your head was ordered.  Someone will call you to schedule an appointment.    A Ct of your chest was ordered.    A referral to [facility] was ordered.    Please followup in 3 weeks

## 2020-09-07 NOTE — Assessment & Plan Note (Addendum)
Acute Started about one week ago  - difficulty walking down hallway - uses walker - has to hand on to things Also with dizziness with extension of head MRI of head to r/o stroke, vertebrobasilar artery syndrome Use walker Will consider PT but will evaluate with imaging first

## 2020-09-07 NOTE — Assessment & Plan Note (Signed)
Chronic Possibly worse She is still smoking - stressed smoking cessation Very sedentary Will be seeing pulmonary

## 2020-09-14 ENCOUNTER — Telehealth: Payer: Self-pay | Admitting: Internal Medicine

## 2020-09-14 NOTE — Telephone Encounter (Signed)
Patients sister called and said that the patient fell this morning. She said that the patient is not doing any better since the patient seen Dr. Lawerance Bach last week. The sister is wondering what they should be doing. She was wondering if home health assistance needed to be brought in. She is requesting a call back at [phone]. Please advise

## 2020-09-14 NOTE — Telephone Encounter (Signed)
I can order home health, which would include a nurse that may come out once a week, home health aide that would be temporary and physical therapy, which I am not sure she will be able to do.  I am not sure if this is going to be enough at this point to help her.  The other option is taking her to the hospital.  I am concerned with the CT scan may show later this week.  Knowing what it shows unfortunately is not can change anything and we need to get her eating and stronger.  Unfortunately the hospital may be the quickest and best choice for her at this time

## 2020-09-15 ENCOUNTER — Encounter (HOSPITAL_COMMUNITY): Payer: Self-pay

## 2020-09-15 ENCOUNTER — Emergency Department (HOSPITAL_COMMUNITY): Payer: Medicare Other

## 2020-09-15 ENCOUNTER — Observation Stay (HOSPITAL_COMMUNITY): Payer: Medicare Other

## 2020-09-15 ENCOUNTER — Other Ambulatory Visit: Payer: Self-pay

## 2020-09-15 ENCOUNTER — Inpatient Hospital Stay (HOSPITAL_COMMUNITY)
Admission: EM | Admit: 2020-09-15 | Discharge: 2020-09-29 | DRG: 064 | Disposition: A | Discharge status: Skilled Nursing Facility | Payer: Medicare Other | Attending: Family Medicine | Admitting: Family Medicine

## 2020-09-15 DIAGNOSIS — G319 Degenerative disease of nervous system, unspecified: Secondary | ICD-10-CM | POA: Diagnosis not present

## 2020-09-15 DIAGNOSIS — G8111 Spastic hemiplegia affecting right dominant side: Secondary | ICD-10-CM | POA: Diagnosis not present

## 2020-09-15 DIAGNOSIS — R8271 Bacteriuria: Secondary | ICD-10-CM | POA: Diagnosis not present

## 2020-09-15 DIAGNOSIS — I6622 Occlusion and stenosis of left posterior cerebral artery: Secondary | ICD-10-CM | POA: Diagnosis not present

## 2020-09-15 DIAGNOSIS — K449 Diaphragmatic hernia without obstruction or gangrene: Secondary | ICD-10-CM | POA: Diagnosis present

## 2020-09-15 DIAGNOSIS — Z66 Do not resuscitate: Secondary | ICD-10-CM | POA: Diagnosis present

## 2020-09-15 DIAGNOSIS — Z803 Family history of malignant neoplasm of breast: Secondary | ICD-10-CM

## 2020-09-15 DIAGNOSIS — F329 Major depressive disorder, single episode, unspecified: Secondary | ICD-10-CM | POA: Diagnosis present

## 2020-09-15 DIAGNOSIS — J439 Emphysema, unspecified: Secondary | ICD-10-CM | POA: Diagnosis not present

## 2020-09-15 DIAGNOSIS — G8929 Other chronic pain: Secondary | ICD-10-CM | POA: Diagnosis not present

## 2020-09-15 DIAGNOSIS — I10 Essential (primary) hypertension: Secondary | ICD-10-CM | POA: Diagnosis present

## 2020-09-15 DIAGNOSIS — E43 Unspecified severe protein-calorie malnutrition: Secondary | ICD-10-CM | POA: Insufficient documentation

## 2020-09-15 DIAGNOSIS — J320 Chronic maxillary sinusitis: Secondary | ICD-10-CM | POA: Diagnosis not present

## 2020-09-15 DIAGNOSIS — Z20822 Contact with and (suspected) exposure to covid-19: Secondary | ICD-10-CM | POA: Diagnosis present

## 2020-09-15 DIAGNOSIS — R7989 Other specified abnormal findings of blood chemistry: Secondary | ICD-10-CM | POA: Diagnosis present

## 2020-09-15 DIAGNOSIS — G9389 Other specified disorders of brain: Secondary | ICD-10-CM | POA: Diagnosis not present

## 2020-09-15 DIAGNOSIS — H53462 Homonymous bilateral field defects, left side: Secondary | ICD-10-CM | POA: Diagnosis present

## 2020-09-15 DIAGNOSIS — R918 Other nonspecific abnormal finding of lung field: Secondary | ICD-10-CM | POA: Diagnosis not present

## 2020-09-15 DIAGNOSIS — I82462 Acute embolism and thrombosis of left calf muscular vein: Secondary | ICD-10-CM | POA: Diagnosis present

## 2020-09-15 DIAGNOSIS — D696 Thrombocytopenia, unspecified: Secondary | ICD-10-CM | POA: Diagnosis present

## 2020-09-15 DIAGNOSIS — Z515 Encounter for palliative care: Secondary | ICD-10-CM

## 2020-09-15 DIAGNOSIS — I444 Left anterior fascicular block: Secondary | ICD-10-CM | POA: Diagnosis present

## 2020-09-15 DIAGNOSIS — Z7982 Long term (current) use of aspirin: Secondary | ICD-10-CM

## 2020-09-15 DIAGNOSIS — M810 Age-related osteoporosis without current pathological fracture: Secondary | ICD-10-CM | POA: Diagnosis present

## 2020-09-15 DIAGNOSIS — R410 Disorientation, unspecified: Secondary | ICD-10-CM | POA: Diagnosis not present

## 2020-09-15 DIAGNOSIS — I672 Cerebral atherosclerosis: Secondary | ICD-10-CM | POA: Diagnosis not present

## 2020-09-15 DIAGNOSIS — Z8659 Personal history of other mental and behavioral disorders: Secondary | ICD-10-CM

## 2020-09-15 DIAGNOSIS — M545 Low back pain, unspecified: Secondary | ICD-10-CM | POA: Diagnosis not present

## 2020-09-15 DIAGNOSIS — D6859 Other primary thrombophilia: Secondary | ICD-10-CM | POA: Diagnosis present

## 2020-09-15 DIAGNOSIS — E785 Hyperlipidemia, unspecified: Secondary | ICD-10-CM | POA: Diagnosis not present

## 2020-09-15 DIAGNOSIS — J3489 Other specified disorders of nose and nasal sinuses: Secondary | ICD-10-CM | POA: Diagnosis not present

## 2020-09-15 DIAGNOSIS — R45851 Suicidal ideations: Secondary | ICD-10-CM | POA: Diagnosis present

## 2020-09-15 DIAGNOSIS — Z9841 Cataract extraction status, right eye: Secondary | ICD-10-CM

## 2020-09-15 DIAGNOSIS — Z681 Body mass index (BMI) 19 or less, adult: Secondary | ICD-10-CM | POA: Diagnosis not present

## 2020-09-15 DIAGNOSIS — D649 Anemia, unspecified: Secondary | ICD-10-CM | POA: Diagnosis not present

## 2020-09-15 DIAGNOSIS — I63443 Cerebral infarction due to embolism of bilateral cerebellar arteries: Principal | ICD-10-CM | POA: Diagnosis present

## 2020-09-15 DIAGNOSIS — E860 Dehydration: Secondary | ICD-10-CM | POA: Diagnosis present

## 2020-09-15 DIAGNOSIS — R531 Weakness: Secondary | ICD-10-CM | POA: Diagnosis not present

## 2020-09-15 DIAGNOSIS — Z7189 Other specified counseling: Secondary | ICD-10-CM | POA: Diagnosis not present

## 2020-09-15 DIAGNOSIS — R296 Repeated falls: Secondary | ICD-10-CM | POA: Diagnosis present

## 2020-09-15 DIAGNOSIS — Z961 Presence of intraocular lens: Secondary | ICD-10-CM | POA: Diagnosis present

## 2020-09-15 DIAGNOSIS — R911 Solitary pulmonary nodule: Secondary | ICD-10-CM | POA: Diagnosis not present

## 2020-09-15 DIAGNOSIS — I6523 Occlusion and stenosis of bilateral carotid arteries: Secondary | ICD-10-CM | POA: Diagnosis not present

## 2020-09-15 DIAGNOSIS — Z5329 Procedure and treatment not carried out because of patient's decision for other reasons: Secondary | ICD-10-CM | POA: Diagnosis not present

## 2020-09-15 DIAGNOSIS — I69828 Other speech and language deficits following other cerebrovascular disease: Secondary | ICD-10-CM | POA: Diagnosis not present

## 2020-09-15 DIAGNOSIS — D689 Coagulation defect, unspecified: Secondary | ICD-10-CM | POA: Diagnosis present

## 2020-09-15 DIAGNOSIS — E876 Hypokalemia: Secondary | ICD-10-CM | POA: Diagnosis present

## 2020-09-15 DIAGNOSIS — I6340 Cerebral infarction due to embolism of unspecified cerebral artery: Secondary | ICD-10-CM | POA: Insufficient documentation

## 2020-09-15 DIAGNOSIS — R29703 NIHSS score 3: Secondary | ICD-10-CM | POA: Diagnosis present

## 2020-09-15 DIAGNOSIS — I700 Atherosclerosis of aorta: Secondary | ICD-10-CM | POA: Diagnosis not present

## 2020-09-15 DIAGNOSIS — R0602 Shortness of breath: Secondary | ICD-10-CM | POA: Diagnosis not present

## 2020-09-15 DIAGNOSIS — I712 Thoracic aortic aneurysm, without rupture: Secondary | ICD-10-CM | POA: Diagnosis not present

## 2020-09-15 DIAGNOSIS — K219 Gastro-esophageal reflux disease without esophagitis: Secondary | ICD-10-CM | POA: Diagnosis present

## 2020-09-15 DIAGNOSIS — Z7401 Bed confinement status: Secondary | ICD-10-CM | POA: Diagnosis not present

## 2020-09-15 DIAGNOSIS — I82452 Acute embolism and thrombosis of left peroneal vein: Secondary | ICD-10-CM | POA: Diagnosis not present

## 2020-09-15 DIAGNOSIS — B9620 Unspecified Escherichia coli [E. coli] as the cause of diseases classified elsewhere: Secondary | ICD-10-CM | POA: Diagnosis present

## 2020-09-15 DIAGNOSIS — R627 Adult failure to thrive: Secondary | ICD-10-CM | POA: Diagnosis present

## 2020-09-15 DIAGNOSIS — Z8249 Family history of ischemic heart disease and other diseases of the circulatory system: Secondary | ICD-10-CM

## 2020-09-15 DIAGNOSIS — I639 Cerebral infarction, unspecified: Secondary | ICD-10-CM | POA: Diagnosis not present

## 2020-09-15 DIAGNOSIS — I6381 Other cerebral infarction due to occlusion or stenosis of small artery: Secondary | ICD-10-CM | POA: Diagnosis not present

## 2020-09-15 DIAGNOSIS — N390 Urinary tract infection, site not specified: Secondary | ICD-10-CM | POA: Diagnosis not present

## 2020-09-15 DIAGNOSIS — R5381 Other malaise: Secondary | ICD-10-CM | POA: Diagnosis not present

## 2020-09-15 DIAGNOSIS — R7303 Prediabetes: Secondary | ICD-10-CM | POA: Diagnosis present

## 2020-09-15 DIAGNOSIS — R54 Age-related physical debility: Secondary | ICD-10-CM | POA: Diagnosis present

## 2020-09-15 DIAGNOSIS — R1312 Dysphagia, oropharyngeal phase: Secondary | ICD-10-CM | POA: Diagnosis not present

## 2020-09-15 DIAGNOSIS — Z801 Family history of malignant neoplasm of trachea, bronchus and lung: Secondary | ICD-10-CM

## 2020-09-15 DIAGNOSIS — Z720 Tobacco use: Secondary | ICD-10-CM | POA: Diagnosis present

## 2020-09-15 DIAGNOSIS — I69398 Other sequelae of cerebral infarction: Secondary | ICD-10-CM | POA: Diagnosis not present

## 2020-09-15 DIAGNOSIS — F17210 Nicotine dependence, cigarettes, uncomplicated: Secondary | ICD-10-CM | POA: Diagnosis present

## 2020-09-15 DIAGNOSIS — N179 Acute kidney failure, unspecified: Secondary | ICD-10-CM | POA: Diagnosis not present

## 2020-09-15 DIAGNOSIS — I69328 Other speech and language deficits following cerebral infarction: Secondary | ICD-10-CM | POA: Diagnosis not present

## 2020-09-15 DIAGNOSIS — I6522 Occlusion and stenosis of left carotid artery: Secondary | ICD-10-CM | POA: Diagnosis present

## 2020-09-15 DIAGNOSIS — R414 Neurologic neglect syndrome: Secondary | ICD-10-CM | POA: Diagnosis not present

## 2020-09-15 DIAGNOSIS — I739 Peripheral vascular disease, unspecified: Secondary | ICD-10-CM | POA: Diagnosis not present

## 2020-09-15 DIAGNOSIS — R279 Unspecified lack of coordination: Secondary | ICD-10-CM | POA: Diagnosis not present

## 2020-09-15 DIAGNOSIS — I6389 Other cerebral infarction: Secondary | ICD-10-CM | POA: Diagnosis not present

## 2020-09-15 DIAGNOSIS — Z79899 Other long term (current) drug therapy: Secondary | ICD-10-CM

## 2020-09-15 DIAGNOSIS — R32 Unspecified urinary incontinence: Secondary | ICD-10-CM | POA: Diagnosis present

## 2020-09-15 DIAGNOSIS — I69391 Dysphagia following cerebral infarction: Secondary | ICD-10-CM | POA: Diagnosis not present

## 2020-09-15 DIAGNOSIS — G459 Transient cerebral ischemic attack, unspecified: Secondary | ICD-10-CM | POA: Diagnosis not present

## 2020-09-15 DIAGNOSIS — Z743 Need for continuous supervision: Secondary | ICD-10-CM | POA: Diagnosis not present

## 2020-09-15 DIAGNOSIS — M6281 Muscle weakness (generalized): Secondary | ICD-10-CM | POA: Diagnosis not present

## 2020-09-15 LAB — COMPREHENSIVE METABOLIC PANEL
ALT: 8 U/L (ref 0–44)
AST: 24 U/L (ref 15–41)
Albumin: 3.4 g/dL — ABNORMAL LOW (ref 3.5–5.0)
Alkaline Phosphatase: 71 U/L (ref 38–126)
Anion gap: 11 (ref 5–15)
BUN: 33 mg/dL — ABNORMAL HIGH (ref 8–23)
CO2: 23 mmol/L (ref 22–32)
Calcium: 9.2 mg/dL (ref 8.9–10.3)
Chloride: 101 mmol/L (ref 98–111)
Creatinine, Ser: 1.47 mg/dL — ABNORMAL HIGH (ref 0.44–1.00)
GFR, Estimated: 35 mL/min — ABNORMAL LOW (ref >60)
Glucose, Bld: 101 mg/dL — ABNORMAL HIGH (ref 70–99)
Potassium: 3.1 mmol/L — ABNORMAL LOW (ref 3.5–5.1)
Sodium: 135 mmol/L (ref 135–145)
Total Bilirubin: 0.9 mg/dL (ref 0.3–1.2)
Total Protein: 7.7 g/dL (ref 6.5–8.1)

## 2020-09-15 LAB — CBC
HCT: 37.5 % (ref 36.0–46.0)
Hemoglobin: 11.9 g/dL — ABNORMAL LOW (ref 12.0–15.0)
MCH: 25.7 pg — ABNORMAL LOW (ref 26.0–34.0)
MCHC: 31.7 g/dL (ref 30.0–36.0)
MCV: 81 fL (ref 80.0–100.0)
Platelets: 98 10*3/uL — ABNORMAL LOW (ref 150–400)
RBC: 4.63 MIL/uL (ref 3.87–5.11)
RDW: 16.5 % — ABNORMAL HIGH (ref 11.5–15.5)
WBC: 9.6 10*3/uL (ref 4.0–10.5)
nRBC: 0 % (ref 0.0–0.2)

## 2020-09-15 LAB — RESP PANEL BY RT-PCR (FLU A&B, COVID) ARPGX2
Influenza A by PCR: NEGATIVE
Influenza B by PCR: NEGATIVE
SARS Coronavirus 2 by RT PCR: NEGATIVE

## 2020-09-15 LAB — CBG MONITORING, ED: Glucose-Capillary: 95 mg/dL (ref 70–99)

## 2020-09-15 LAB — MAGNESIUM: Magnesium: 2.1 mg/dL (ref 1.7–2.4)

## 2020-09-15 LAB — TSH: TSH: 3.272 u[IU]/mL (ref 0.350–4.500)

## 2020-09-15 IMAGING — CT CT CHEST W/O CM
2 of 4 series · 15 of 36 positions shown, 18 images · non-contrast
Comparison: Chest radiography same day.  CT angiography [DATE].

CLINICAL DATA: Persistent cough.  Weakness and shortness of breath.

EXAM:
CT CHEST WITHOUT CONTRAST
TECHNIQUE: Multidetector CT imaging of the chest was performed following the
standard protocol without IV contrast.

[Series 3: chest wo · axial · 0.58mm/px · z∈[+1165,+1407]mm · 12 of 145 slices shown, 15 images]
[im 12/145  mediastinal]
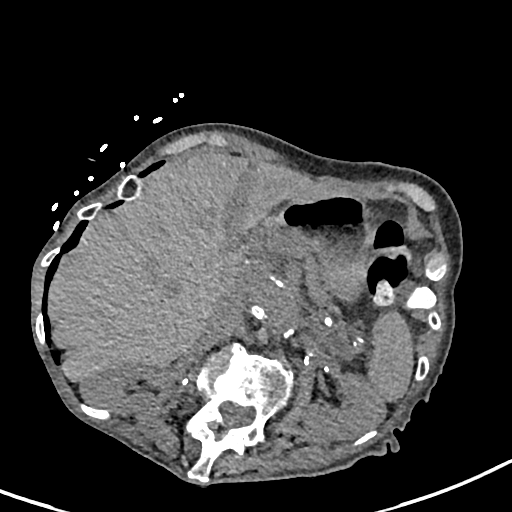
[im 12/145  lung]
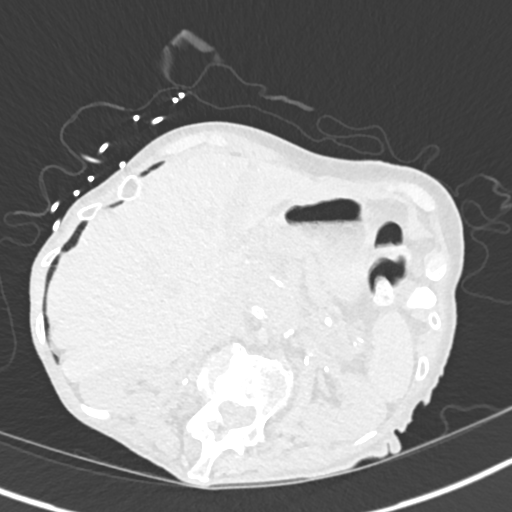
[im 23/145  lung]
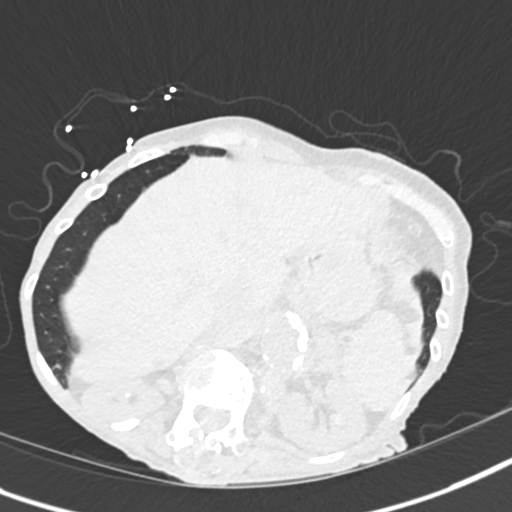
[im 34/145  lung]
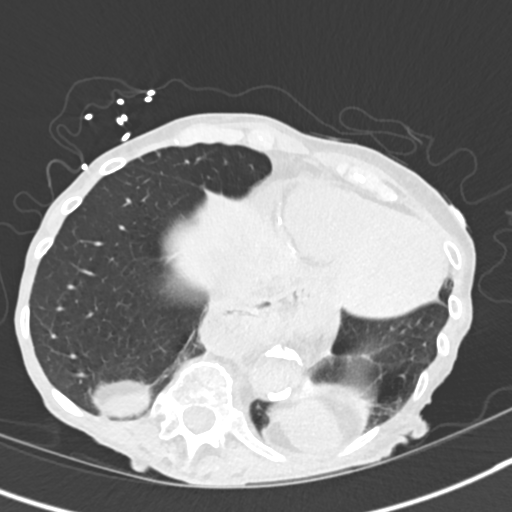
[im 45/145  lung]
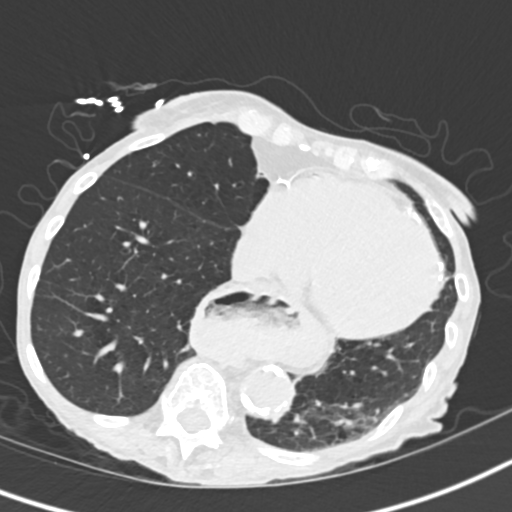
[im 56/145  mediastinal]
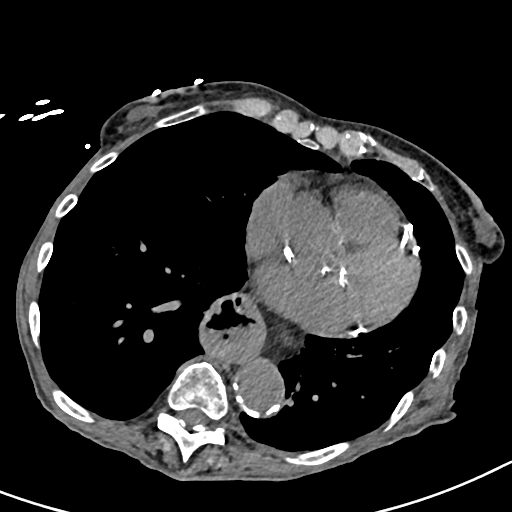
[im 56/145  lung]
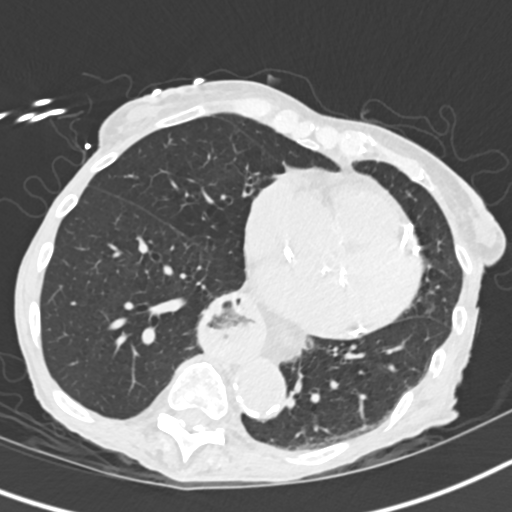
[im 67/145  lung]
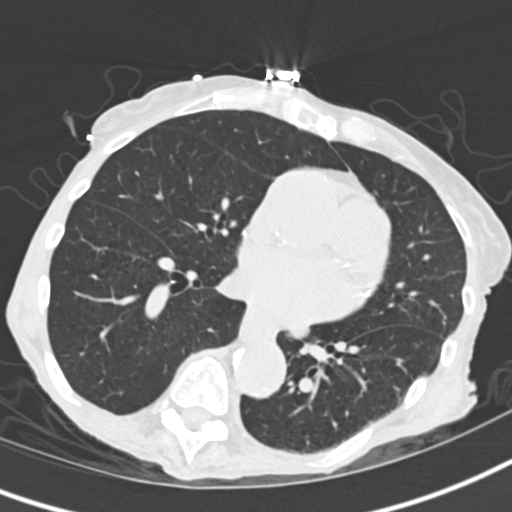
[im 78/145  lung]
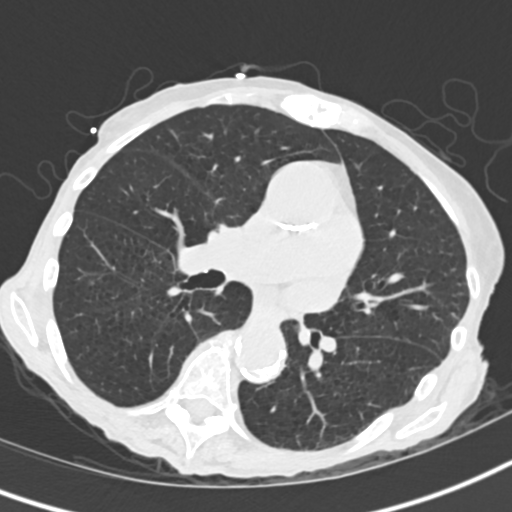
[im 89/145  lung]
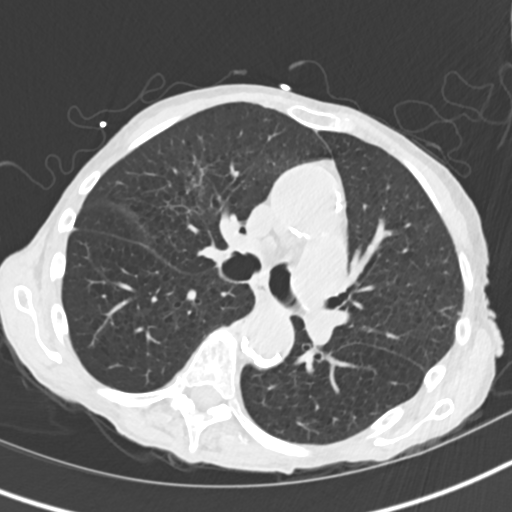
[im 100/145  mediastinal]
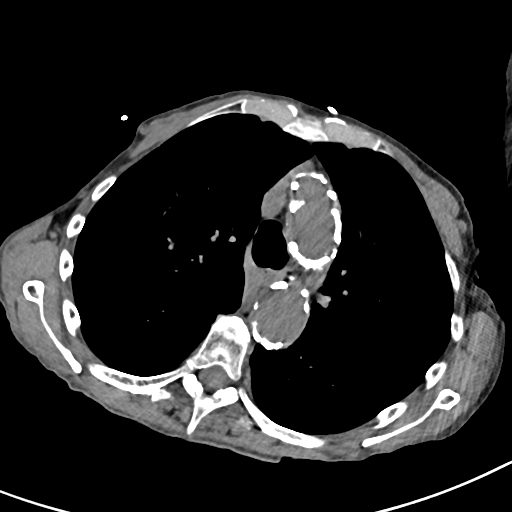
[im 100/145  lung]
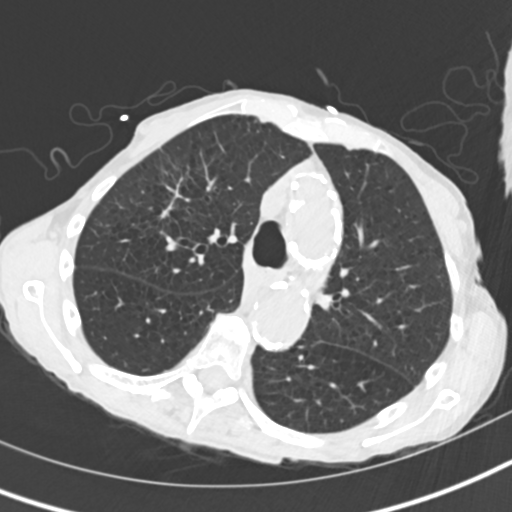
[im 111/145  lung]
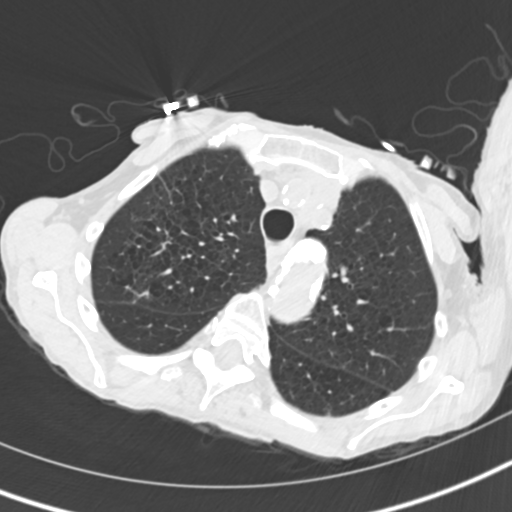
[im 122/145  lung]
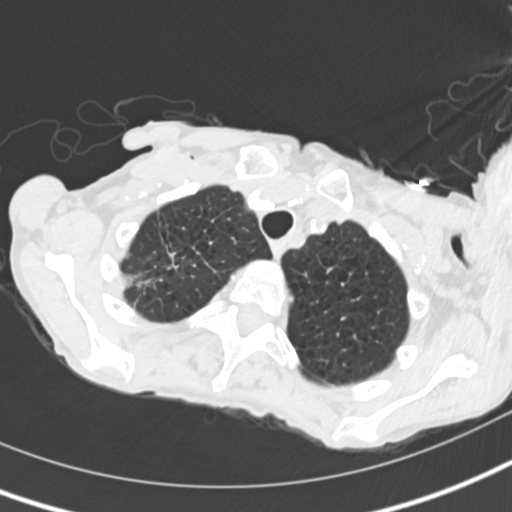
[im 133/145  lung]
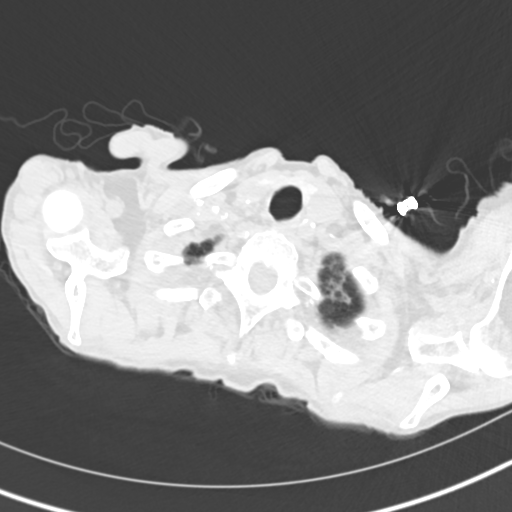

[Series 6: cor · coronal · 0.57mm/px · 3 of 127 slices shown]
[im 26/127  lung]
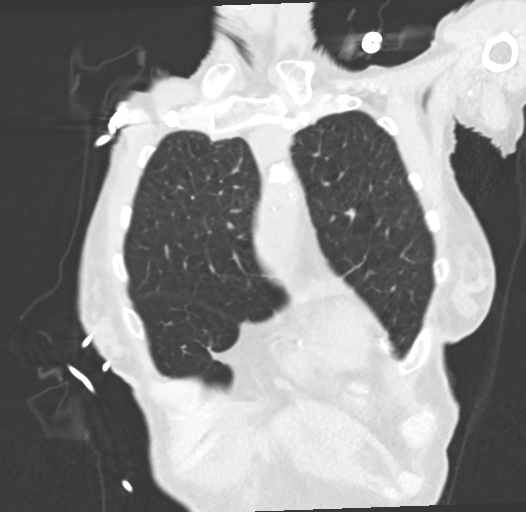
[im 51/127  lung]
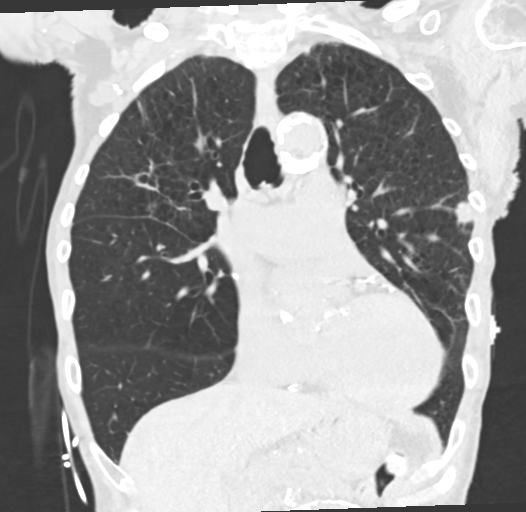
[im 76/127  lung]
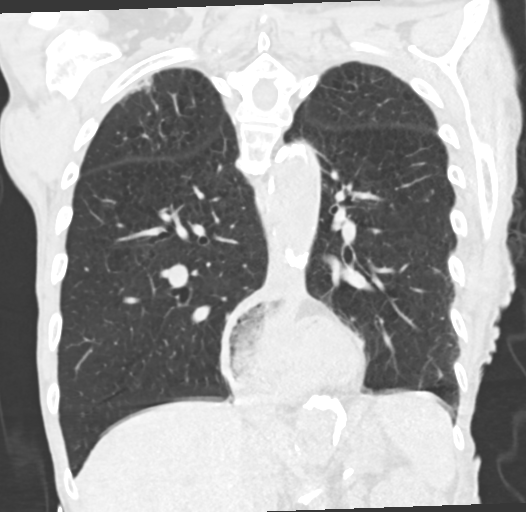

[15 of 36 positions shown; findings below may reference images not displayed]

FINDINGS: Cardiovascular: Cardiomegaly. No pericardial fluid. Extensive
coronary artery calcification and/or stents. Extensive aortic
atherosclerotic calcification. Maximal diameter of the ascending
aorta 4.4 cm, increased by 1 mm from the previous exam.

Mediastinum/Nodes: No hilar or mediastinal mass or lymphadenopathy.
Large hiatal hernia.

Lungs/Pleura: Emphysema and pulmonary scarring as seen previously.
Previous 6 mm density in the right upper lobe question previously
looks more like a simple scar today. This is not a measurable nodule
on today's exam. This is located in the region of image 46. Scarring
at both lung apices. No evidence of focal nodule or mass in the
right lung. However, in the lateral aspect of the left upper lobe
axial image 62, the patient has developed a 1 cm peripheral
mass/nodule where there was only a small area of scarring
previously. This is viewed with considerable concern given the rate
of growth. No pleural effusion on either side.

Upper Abdomen: Hiatal hernia as noted previously. Partial
visualization of the abdominal aortic aneurysm measuring up to
cm in the portion imaged. This was not fully or completely
evaluated.

Musculoskeletal: Chronic augmented fracture in the upper lumbar
region. Old healed sternal fracture. No acute bone finding.
IMPRESSION: Emphysema and pulmonary scarring. No sign of pneumonia, collapse or
effusion.

Newly seen 1 cm mass in the lateral left upper lobe, axial images 61
and 62. This was not present previously and is quite concerning for
pulmonary malignancy. Consider one of the following in 3 months for
both low-risk and high-risk individuals: (a) repeat chest CT, (b)
follow-up PET-CT, or (c) tissue sampling. This recommendation
follows the consensus statement: Guidelines for Management of
Incidental Pulmonary Nodules Detected on CT Images: From the

Previously seen density in the right upper lobe is no longer
measurable and looks like a simple scar.

Cardiomegaly, coronary artery calcification it advanced
atherosclerosis of the aorta. Maximal diameter of the ascending
aorta increased from 4.3 cm to 4.4 cm. Recommend annual imaging
followup by CTA or MRA. This recommendation follows [I9]
ACCF/AHA/AATS/ACR/ASA/SCA/SINGHATEH/SINGHATEH/SINGHATEH/SINGHATEH Guidelines for the
Diagnosis and Management of Patients with Thoracic Aortic Disease.
Circulation. [I9]; 121: [id number]. Aortic aneurysm NOS ([I9]-[I9])

Partial visualization of a large abdominal aortic aneurysm as seen
previously, not completely evaluated today. Only the upper portion
is visualized.

Large hiatal hernia.

Aortic Atherosclerosis ([I9]-[I9]) and Emphysema ([I9]-[I9]).

## 2020-09-15 IMAGING — CR DG CHEST 2V
2 series · 2 of 2 positions shown · non-contrast
Comparison: [DATE]

CLINICAL DATA: Weakness and shortness of breath.

EXAM:
CHEST - 2 VIEW

[chest lat]
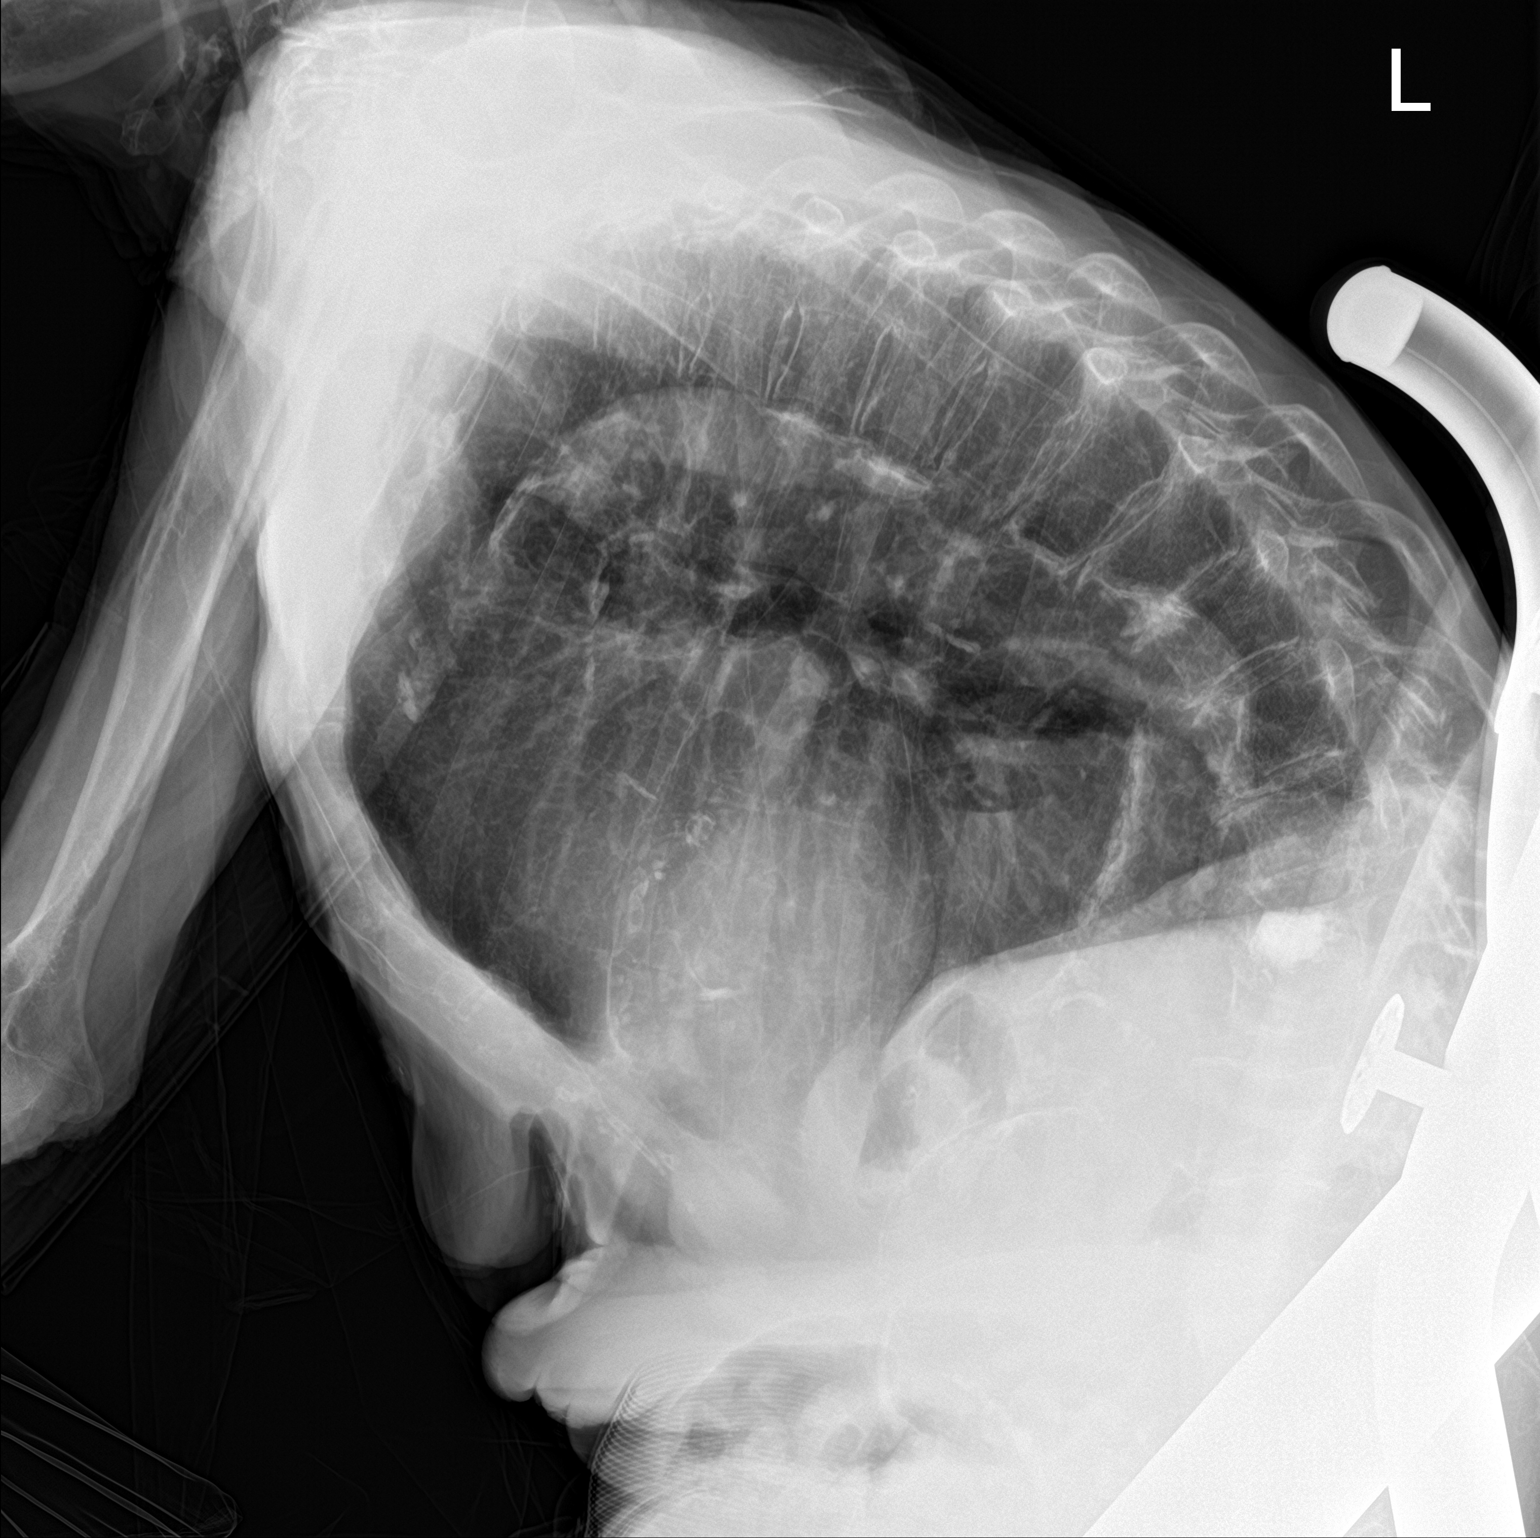

[chest ap]
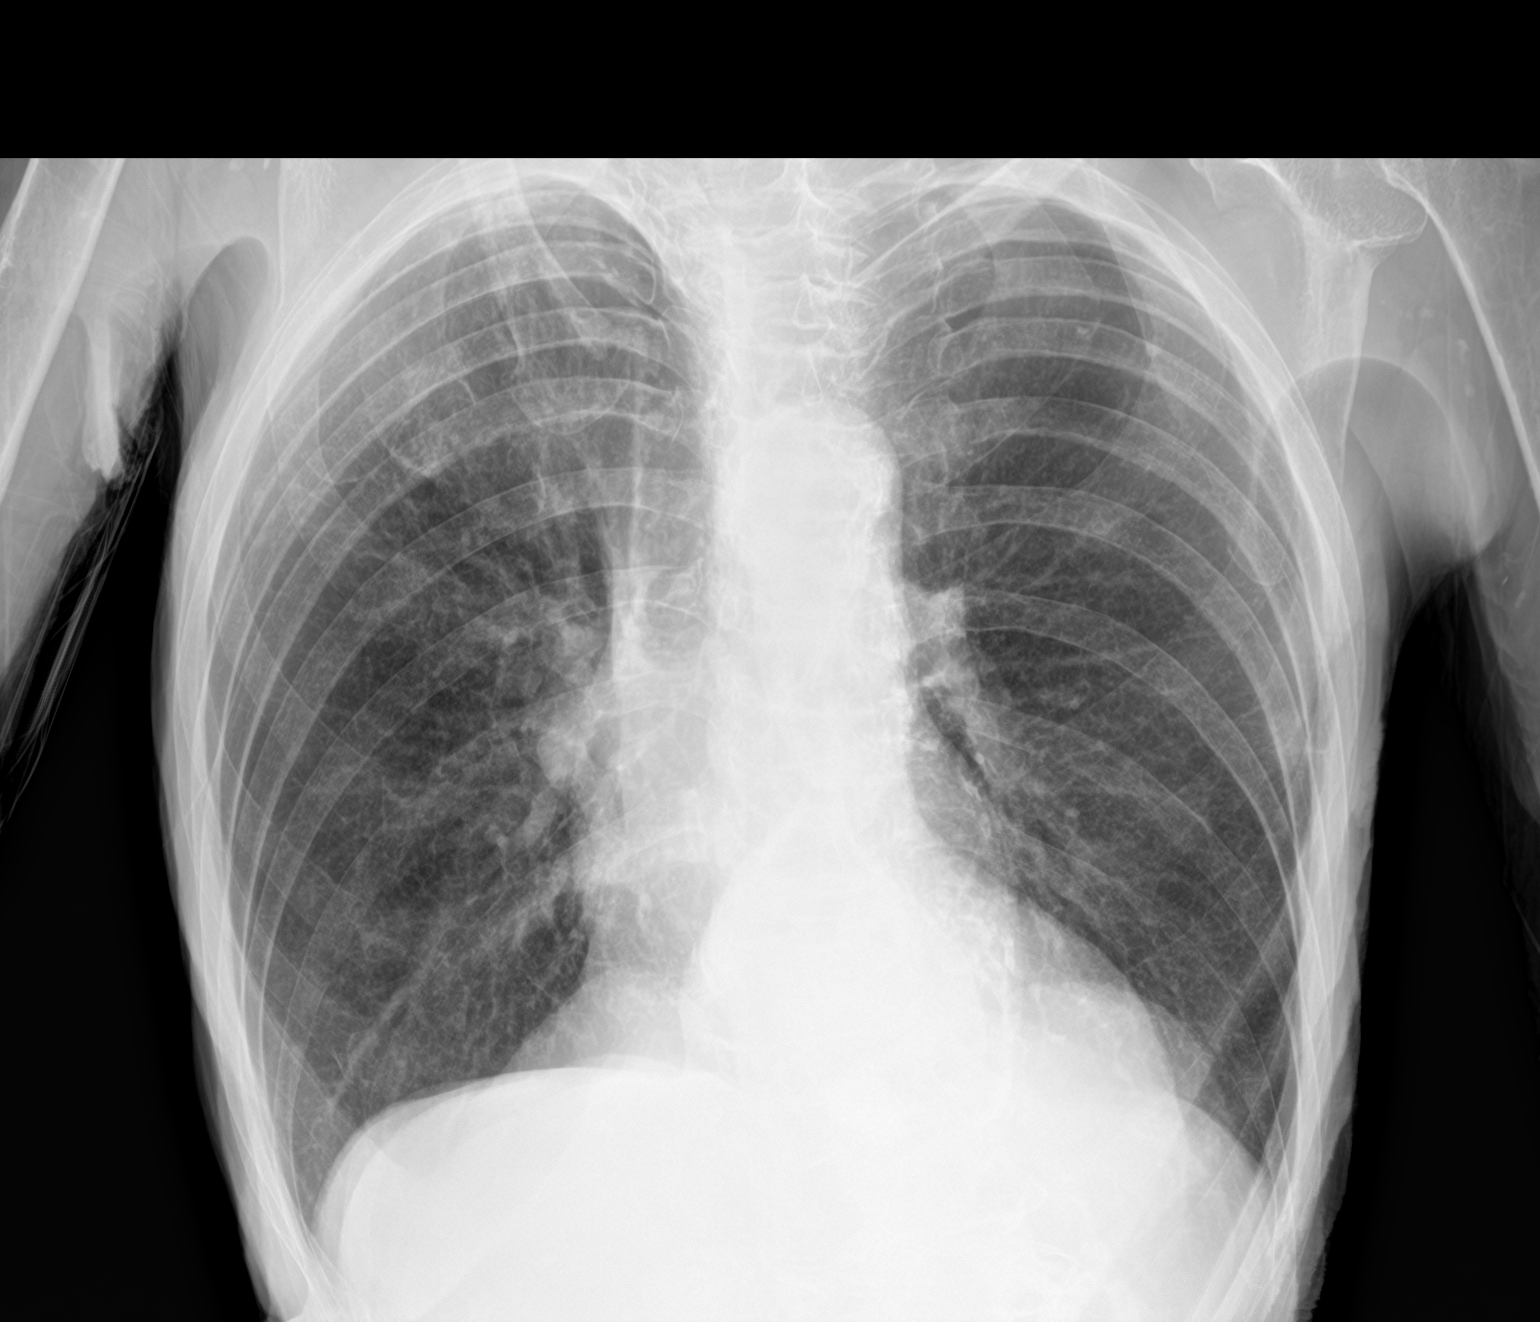

[2 of 2 positions shown; findings below may reference images not displayed]

FINDINGS: Again noted is kyphosis in the thoracic spine. Both lungs are clear.
Heart size is stable. Evidence for hiatal hernia. In the interim,
there has been bone cement placed in the L1 vertebral body.
Difficult to exclude a new compression deformity at L2. Diffuse
atherosclerotic calcifications in the thoracic aorta.
IMPRESSION: 1. No acute cardiopulmonary disease.
2. Previous kyphoplasty at L1. Cannot exclude a new compression
deformity at L2. Recommend dedicated lumbar spine images if this is
an area of clinical concern.

## 2020-09-15 IMAGING — CT CT HEAD W/O CM
4 series · 16 of 47 positions shown, 18 images · non-contrast
Comparison: None.

CLINICAL DATA: Delirium

EXAM:
CT HEAD WITHOUT CONTRAST
TECHNIQUE: Contiguous axial images were obtained from the base of the skull
through the vertex without intravenous contrast.

[Series 2: head bone · axial · 0.41mm/px · z∈[-146,-116]mm · 3 of 77 slices shown]
[im 8/77  bone]
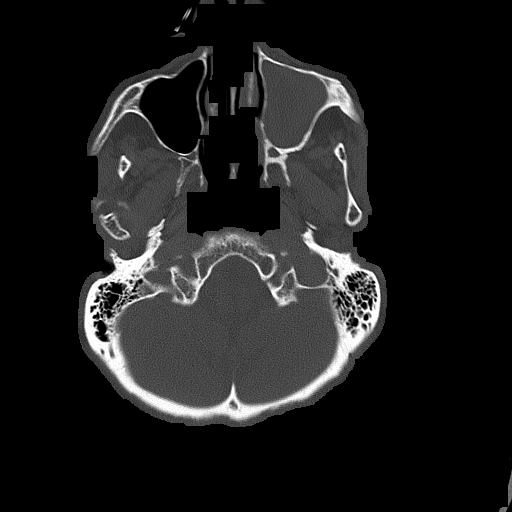
[im 16/77  bone]
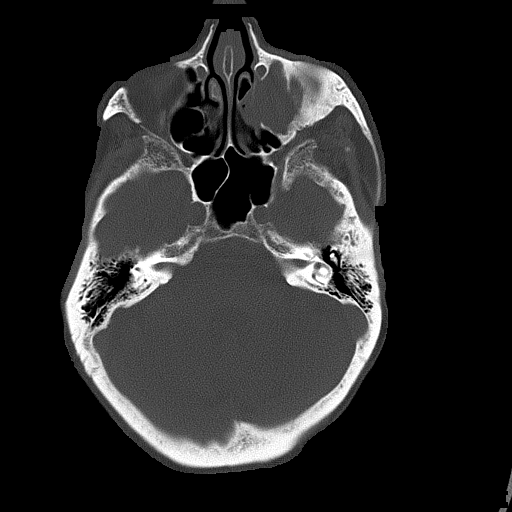
[im 23/77  bone]
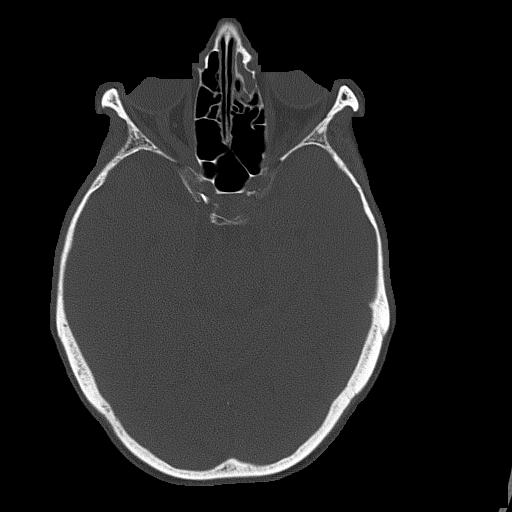

[Series 3: head without · axial · non-contrast · 0.41mm/px · z∈[-145,-30]mm · 7 of 31 slices shown, 9 images]
[im 4/31  brain]
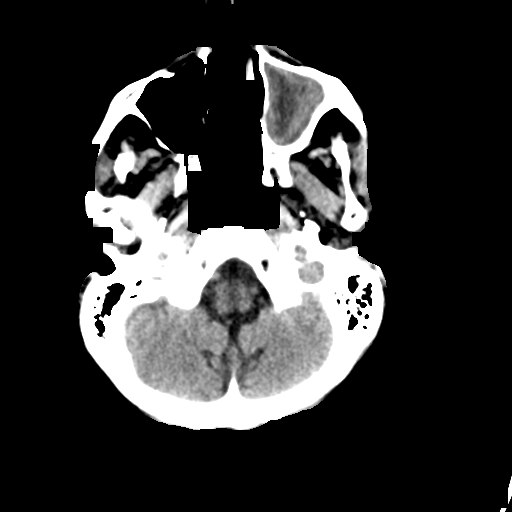
[im 4/31  bone]
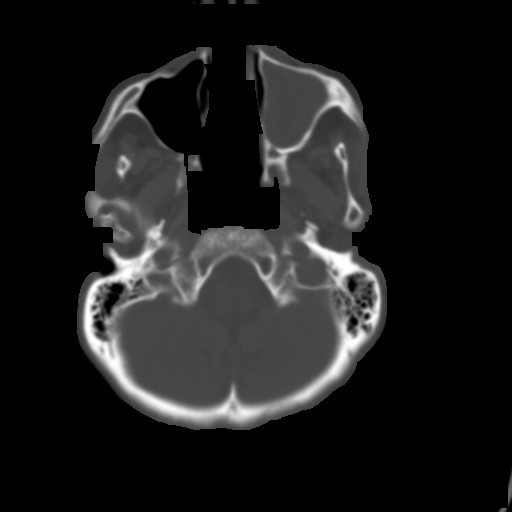
[im 8/31  brain]
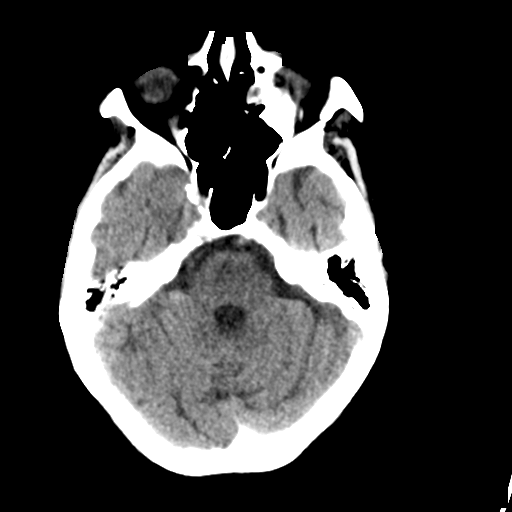
[im 12/31  brain]
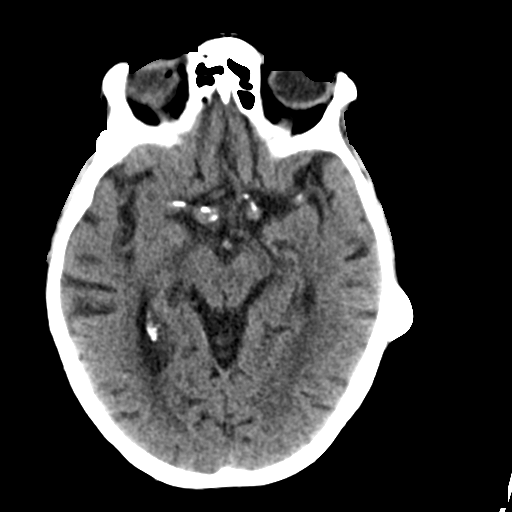
[im 16/31  brain]
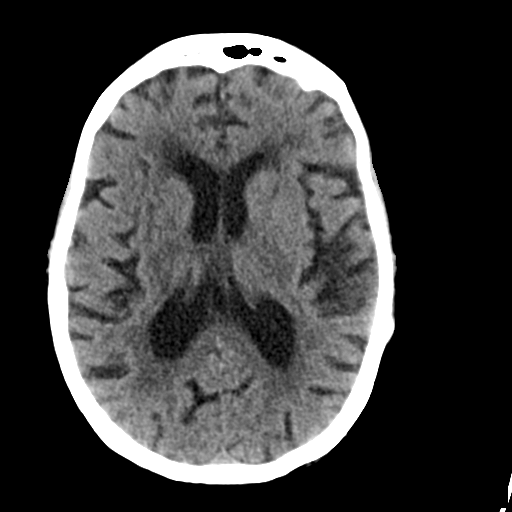
[im 19/31  brain]
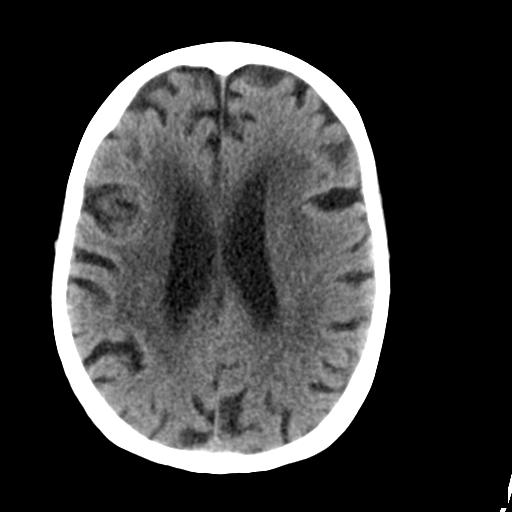
[im 19/31  bone]
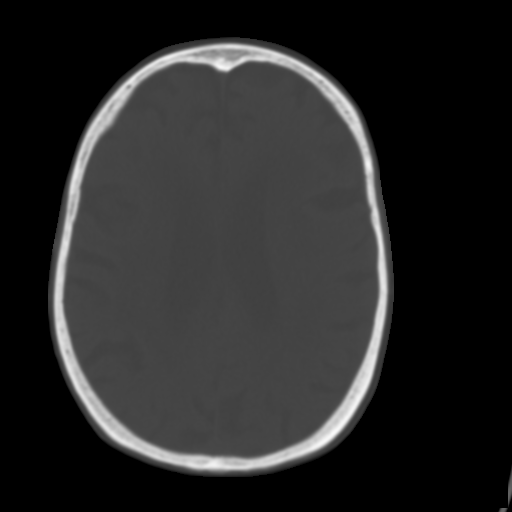
[im 23/31  brain]
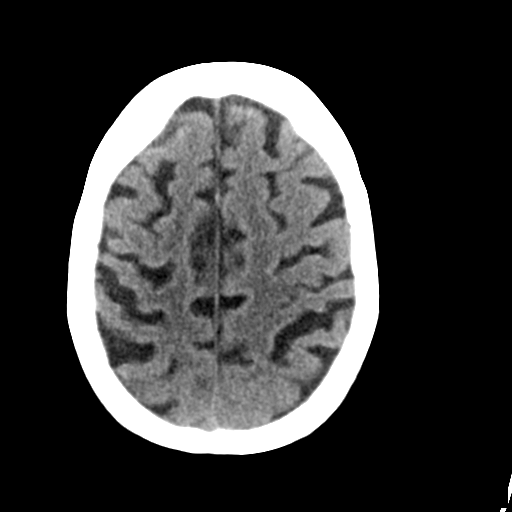
[im 27/31  brain]
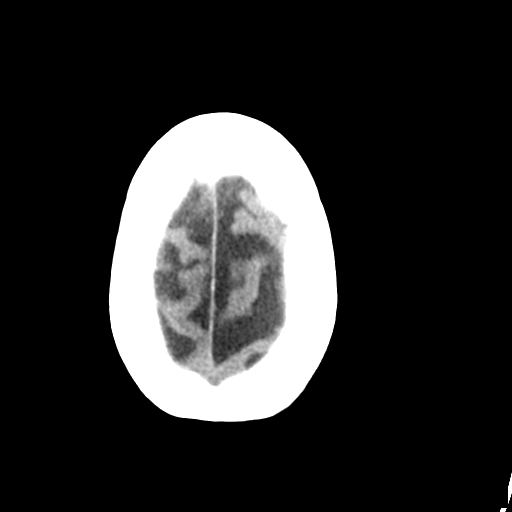

[Series 4: head without cor · coronal · non-contrast · 0.30mm/px · 3 of 67 slices shown]
[im 23/67  brain]
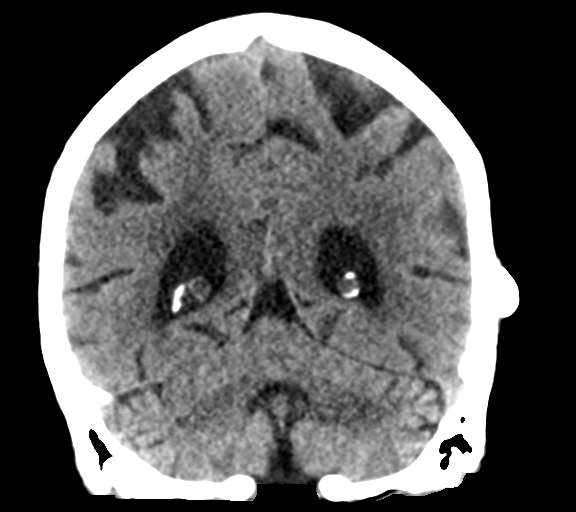
[im 30/67  brain]
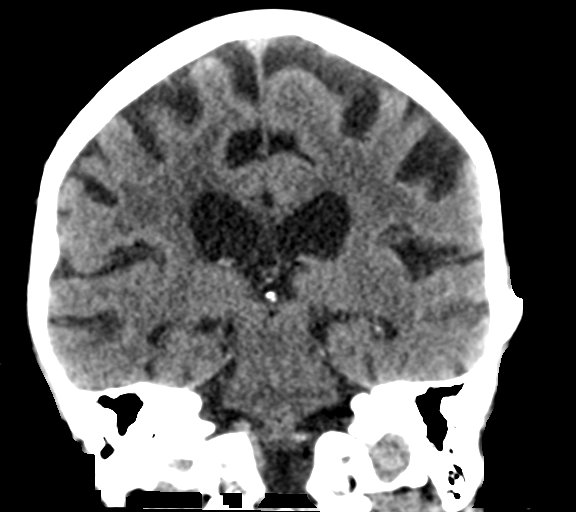
[im 37/67  brain]
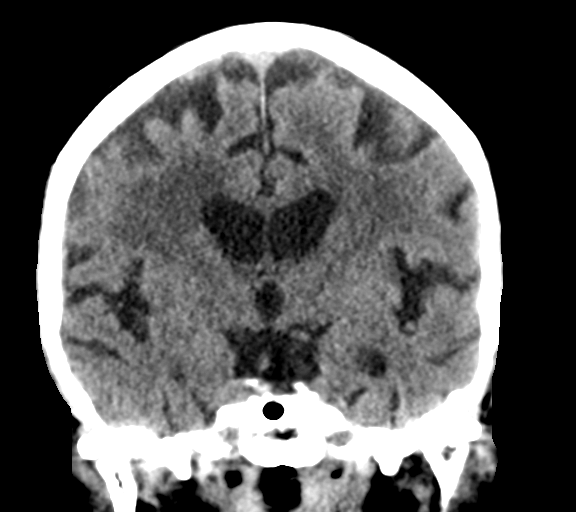

[Series 5: head without sag · sagittal · non-contrast · 0.30mm/px · 3 of 57 slices shown]
[im 19/57  brain]
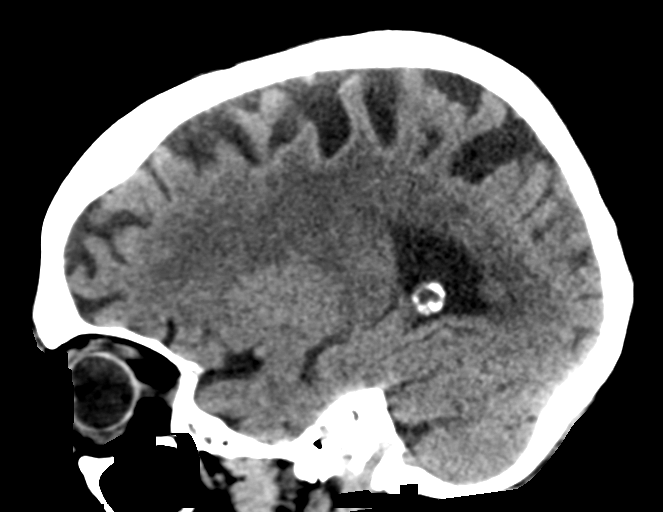
[im 29/57  brain]
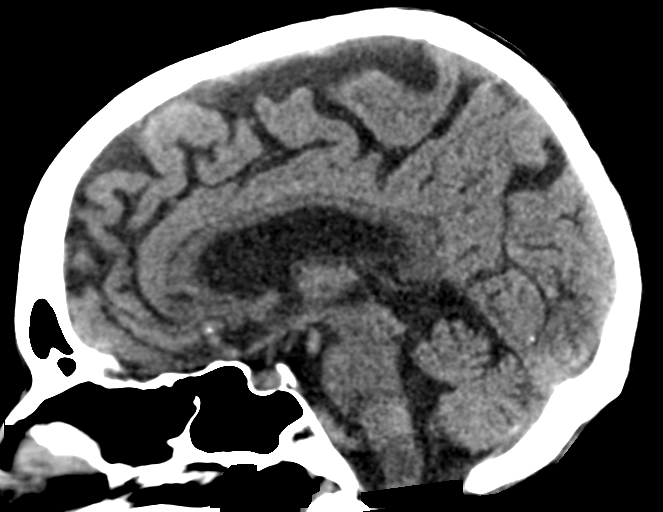
[im 38/57  brain]
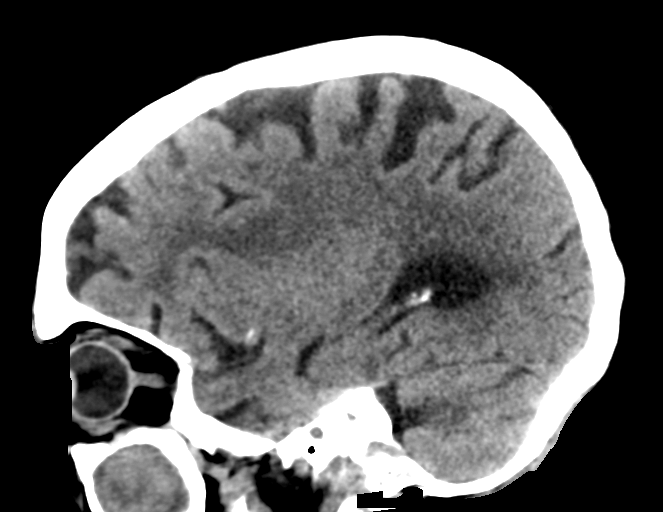

[16 of 47 positions shown; findings below may reference images not displayed]

FINDINGS: Brain: No evidence of acute infarction, hemorrhage, hydrocephalus,
extra-axial collection or mass lesion/mass effect. Diffuse cortical
atrophy. Periventricular white matter hypodensity is a nonspecific
finding, but most commonly relates to chronic ischemic small vessel
disease.

Vascular: No hyperdense vessel or unexpected calcification.

Skull: Smoothly marginated protuberant mass extending laterally from
the left side of the calvarium measuring 2.4 x 1.5 cm is likely an
osteoma. Legs aggressive characteristics such as periosteal reaction
or cortical breakthrough.

Sinuses/Orbits: Partial opacification of left anterior ethmoid air
cells. Complete opacification left maxillary sinus. Hyperdense
content of the left maxillary sinus may be due to inspissated mucus,
hemorrhage, or fungal infection.

Other: None.
IMPRESSION: No acute intracranial abnormality

## 2020-09-15 IMAGING — MR MR HEAD W/O CM
6 of 11 series · 24 of 48 positions shown · non-contrast
Comparison: Prior CT from earlier the same day.

CLINICAL DATA: Initial evaluation for neuro deficit, stroke
suspected.

EXAM:
MRI HEAD WITHOUT CONTRAST
TECHNIQUE: Multiplanar, multiecho pulse sequences of the brain and surrounding
structures were obtained without intravenous contrast.

[Series 2: DWI · axial · 3.0mm · 0.94mm/px · z∈[-92,+66]mm · 7 of 107 slices shown (1 of 2)]
[im 1/107]
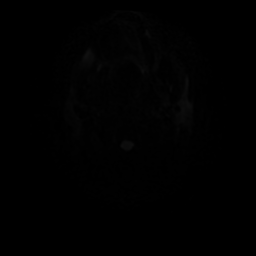
[im 18/107]
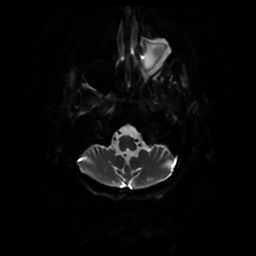
[im 36/107]
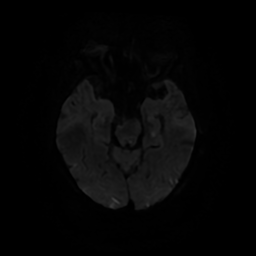
[im 54/107]
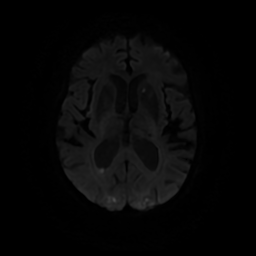
[im 71/107]
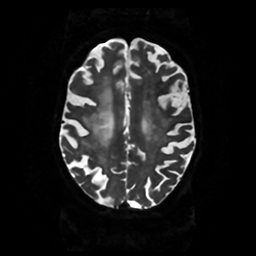
[im 89/107]
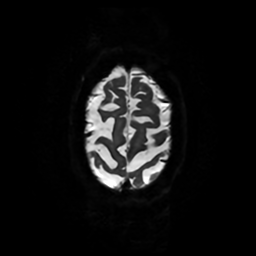
[im 107/107]
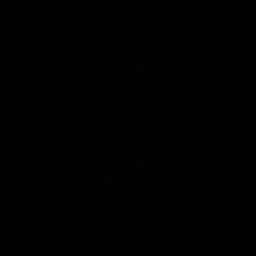

[Series 3: DWI · coronal · 4.0mm · 0.94mm/px · 6 of 74 slices shown (2 of 2)]
[im 1/74]
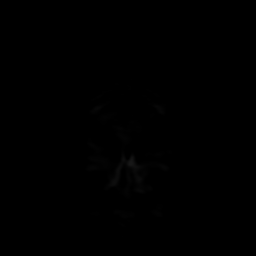
[im 15/74]
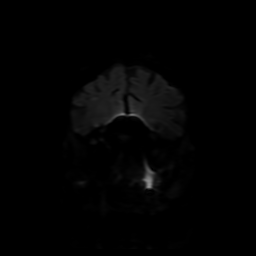
[im 30/74]
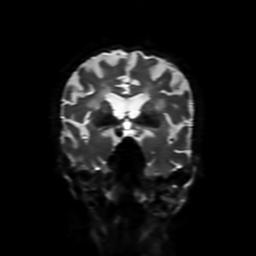
[im 44/74]
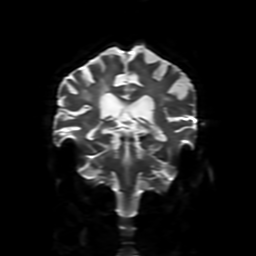
[im 59/74]
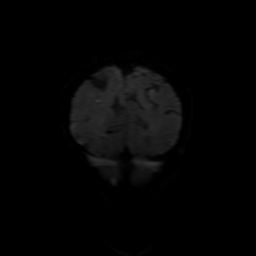
[im 74/74]
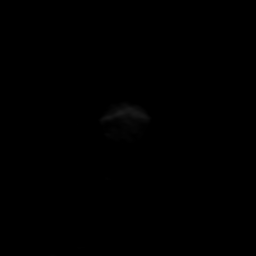

[Series 4: FLAIR · sagittal · 5.0mm · 0.23mm/px · 2 of 25 slices shown (1 of 2)]
[im 1/25]
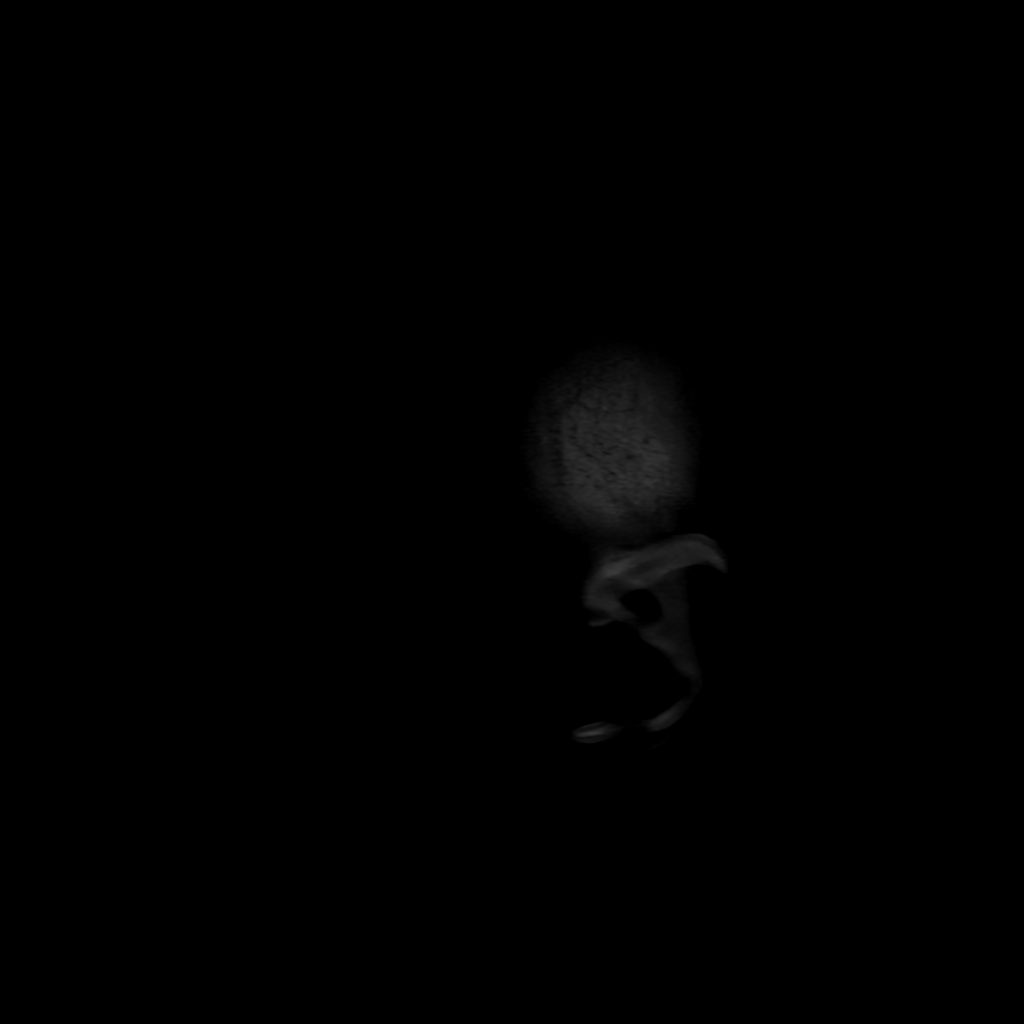
[im 25/25]
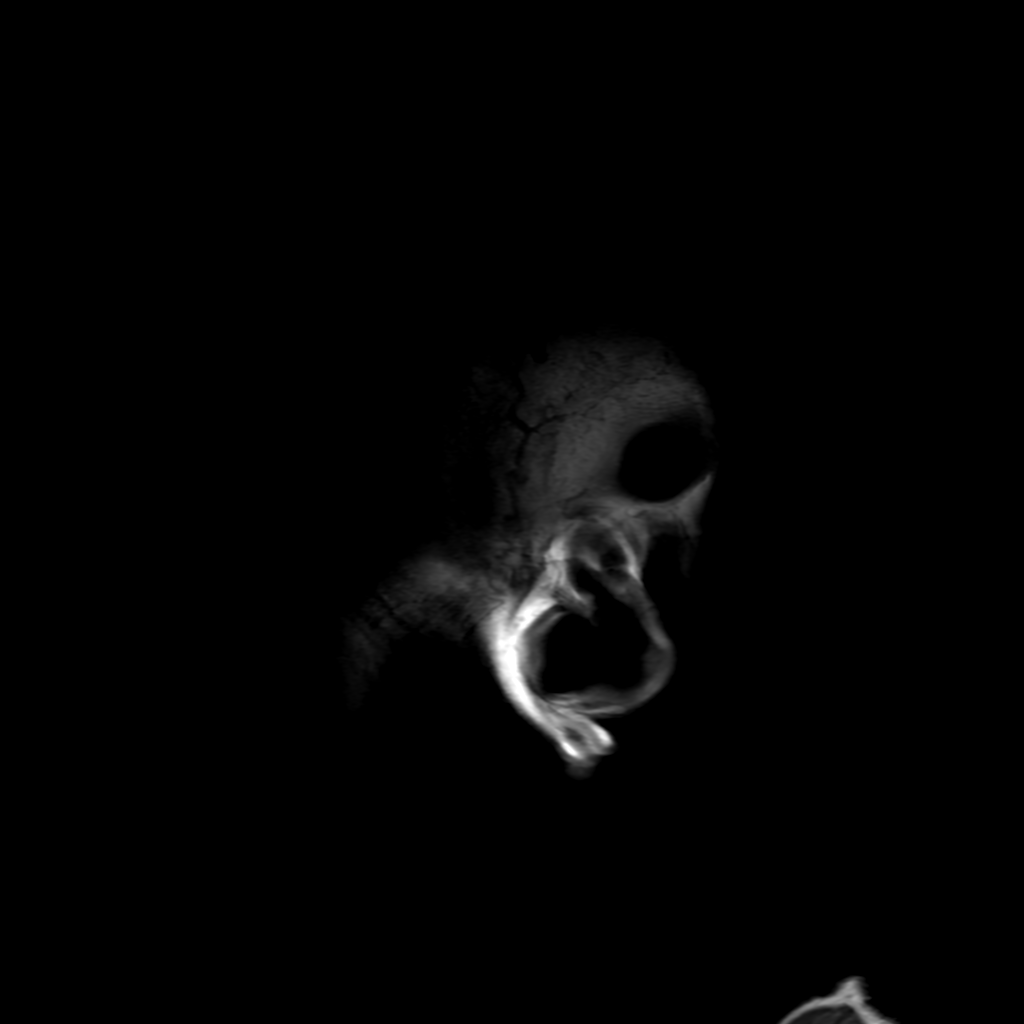

[Series 6: FLAIR · axial · 3.0mm · 0.45mm/px · z∈[-88,+67]mm · 2 of 27 slices shown (2 of 2)]
[im 1/27]
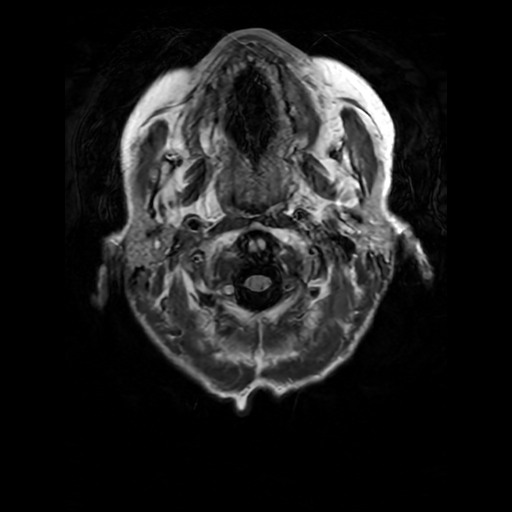
[im 27/27]
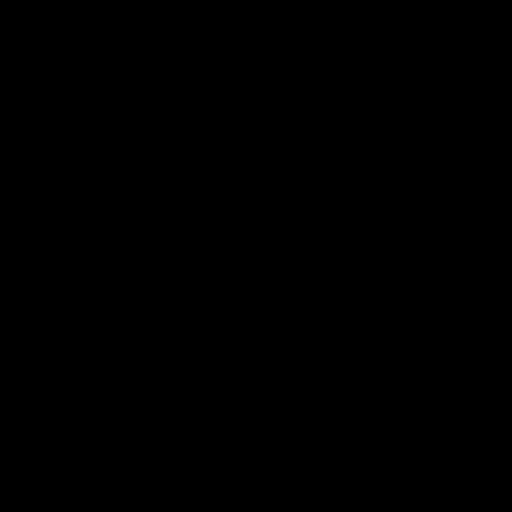

[Series 250: ADC · axial · 3.0mm · 0.94mm/px · z∈[-92,+66]mm · 4 of 54 slices shown (1 of 2)]
[im 1/54]
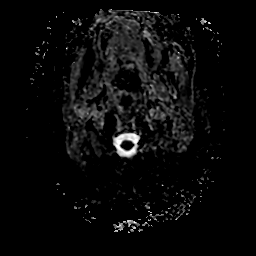
[im 18/54]
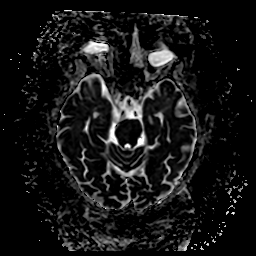
[im 36/54]
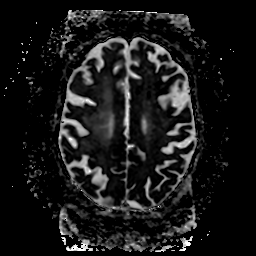
[im 54/54]
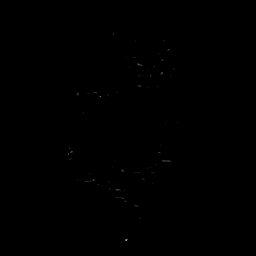

[Series 350: ADC · coronal · 4.0mm · 0.94mm/px · 3 of 37 slices shown (2 of 2)]
[im 1/37]
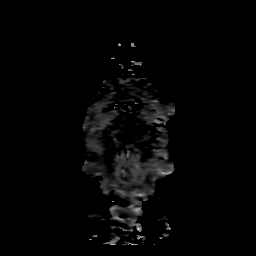
[im 19/37]
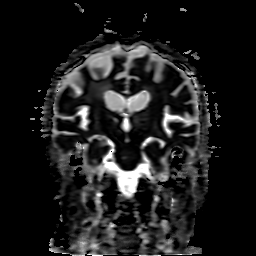
[im 37/37]
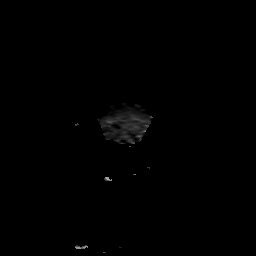

[24 of 48 positions shown; findings below may reference images not displayed]

FINDINGS: Brain: Diffuse prominence of the CSF containing spaces compatible
with generalized cerebral atrophy, moderately advanced in nature.
Patchy and confluent T2/FLAIR hyperintensity involving the
periventricular and deep white matter both cerebral hemispheres as
well as the pons, most consistent with chronic small vessel ischemic
disease, also moderately advanced. Few scattered superimposed remote
lacunar infarcts present about the thalami and pons.

Multiple scattered predominantly subcentimeter foci of restricted
diffusion seen involving the cortical and subcortical aspects of
both cerebral and cerebellar hemispheres, consistent with multifocal
ischemic infarcts. Scattered patchy involvement of the deep gray
nuclei and brainstem as well. For reference purposes, the most
prominent discrete focus seen involving the right periatrial white
matter and measures 7 mm. No associated hemorrhage or significant
mass effect. Multiple scattered underlying chronic micro hemorrhages
noted, predominantly clustered about the cerebellum, brainstem, and
thalami, likely related to poorly controlled hypertension.

No mass lesion, midline shift or mass effect. No hydrocephalus or
extra-axial fluid collection. Pituitary gland suprasellar region
within normal limits. Midline structures intact.

Vascular: Major intracranial vascular flow voids are maintained.

Skull and upper cervical spine: Craniocervical junction within
normal limits. Bone marrow signal intensity normal. No scalp soft
tissue abnormality.

Sinuses/Orbits: Patient status post ocular lens replacement on the
right. Globes and orbital soft tissues demonstrate no acute finding.
Chronic left ethmoidal and maxillary sinusitis noted. Trace
bilateral mastoid effusions, of doubtful significance. Inner ear
structures grossly normal.

Other: None.
IMPRESSION: 1. Multiple scattered predominantly subcentimeter foci of restricted
diffusion involving the cortical and subcortical aspects of both
cerebral and cerebellar hemispheres, consistent with multifocal
ischemic infarcts. No associated hemorrhage or significant mass
effect.
2. Underlying moderately advanced cerebral atrophy with chronic
microvascular ischemic disease, with multiple additional remote
lacunar infarcts about the bilateral thalami and pons.
3. Multiple chronic micro hemorrhages clustered about the
cerebellum, brainstem, and thalami, suggesting chronic poorly
controlled hypertension.

## 2020-09-15 MED ORDER — HYDROCODONE-ACETAMINOPHEN 5-325 MG PO TABS: 1.0000 | ORAL_TABLET | ORAL | Status: DC | PRN

## 2020-09-15 MED ORDER — ACETAMINOPHEN 650 MG RE SUPP: 650.0000 mg | Freq: Four times a day (QID) | RECTAL | Status: DC | PRN

## 2020-09-15 MED ORDER — LACTATED RINGERS IV SOLN: INTRAVENOUS | Status: DC

## 2020-09-15 MED ORDER — PANTOPRAZOLE SODIUM 40 MG PO TBEC
40.0000 mg | DELAYED_RELEASE_TABLET | Freq: Every day | ORAL | Status: DC
  Administered 2020-09-16 – 2020-09-29 (×14): 40 mg via ORAL
  Filled 2020-09-15 (×14): qty 1

## 2020-09-15 MED ORDER — ATORVASTATIN CALCIUM 10 MG PO TABS
20.0000 mg | ORAL_TABLET | Freq: Every day | ORAL | Status: DC
  Administered 2020-09-16: 20 mg via ORAL
  Filled 2020-09-15: qty 2

## 2020-09-15 MED ORDER — ONDANSETRON HCL 4 MG/2ML IJ SOLN
4.0000 mg | Freq: Four times a day (QID) | INTRAMUSCULAR | Status: DC | PRN
  Administered 2020-09-18 – 2020-09-24 (×2): 4 mg via INTRAVENOUS
  Filled 2020-09-15 (×2): qty 2

## 2020-09-15 MED ORDER — MORPHINE SULFATE (PF) 2 MG/ML IV SOLN
2.0000 mg | INTRAVENOUS | Status: DC | PRN
  Administered 2020-09-21 – 2020-09-29 (×6): 2 mg via INTRAVENOUS
  Filled 2020-09-15 (×6): qty 1

## 2020-09-15 MED ORDER — SODIUM CHLORIDE 0.9% FLUSH
3.0000 mL | Freq: Two times a day (BID) | INTRAVENOUS | Status: DC
  Administered 2020-09-15 – 2020-09-22 (×13): 3 mL via INTRAVENOUS

## 2020-09-15 MED ORDER — DOCUSATE SODIUM 100 MG PO CAPS
100.0000 mg | ORAL_CAPSULE | Freq: Two times a day (BID) | ORAL | Status: DC
  Administered 2020-09-15 – 2020-09-29 (×22): 100 mg via ORAL
  Filled 2020-09-15 (×25): qty 1

## 2020-09-15 MED ORDER — ASPIRIN EC 81 MG PO TBEC
81.0000 mg | DELAYED_RELEASE_TABLET | Freq: Every day | ORAL | Status: DC
  Administered 2020-09-16 – 2020-09-19 (×4): 81 mg via ORAL
  Filled 2020-09-15 (×4): qty 1

## 2020-09-15 MED ORDER — NICOTINE 14 MG/24HR TD PT24: 14.0000 mg | MEDICATED_PATCH | Freq: Every day | TRANSDERMAL | Status: DC | PRN

## 2020-09-15 MED ORDER — CILOSTAZOL 50 MG PO TABS
50.0000 mg | ORAL_TABLET | Freq: Two times a day (BID) | ORAL | Status: DC
  Administered 2020-09-16 – 2020-09-17 (×4): 50 mg via ORAL
  Filled 2020-09-15 (×6): qty 1

## 2020-09-15 MED ORDER — ACETAMINOPHEN 325 MG PO TABS
650.0000 mg | ORAL_TABLET | Freq: Four times a day (QID) | ORAL | Status: DC | PRN
  Administered 2020-09-15: 650 mg via ORAL
  Filled 2020-09-15: qty 2

## 2020-09-15 MED ORDER — POTASSIUM CHLORIDE CRYS ER 20 MEQ PO TBCR
40.0000 meq | EXTENDED_RELEASE_TABLET | Freq: Once | ORAL | Status: AC
  Administered 2020-09-15: 40 meq via ORAL
  Filled 2020-09-15: qty 2

## 2020-09-15 MED ORDER — ESCITALOPRAM OXALATE 10 MG PO TABS
5.0000 mg | ORAL_TABLET | Freq: Every day | ORAL | Status: DC
  Administered 2020-09-16: 5 mg via ORAL
  Filled 2020-09-15: qty 1

## 2020-09-15 MED ORDER — ENOXAPARIN SODIUM 40 MG/0.4ML IJ SOSY
40.0000 mg | PREFILLED_SYRINGE | INTRAMUSCULAR | Status: DC
  Administered 2020-09-15: 40 mg via SUBCUTANEOUS
  Filled 2020-09-15: qty 0.4

## 2020-09-15 MED ORDER — QUETIAPINE FUMARATE 25 MG PO TABS
25.0000 mg | ORAL_TABLET | Freq: Every day | ORAL | Status: DC
  Administered 2020-09-15 – 2020-09-22 (×8): 25 mg via ORAL
  Filled 2020-09-15 (×9): qty 1

## 2020-09-15 MED ORDER — BISACODYL 5 MG PO TBEC: 5.0000 mg | DELAYED_RELEASE_TABLET | Freq: Every day | ORAL | Status: DC | PRN

## 2020-09-15 MED ORDER — HYDRALAZINE HCL 20 MG/ML IJ SOLN: 5.0000 mg | INTRAMUSCULAR | Status: DC | PRN

## 2020-09-15 MED ORDER — ALPRAZOLAM 0.25 MG PO TABS
0.2500 mg | ORAL_TABLET | Freq: Every evening | ORAL | Status: DC | PRN
  Administered 2020-09-15: 0.25 mg via ORAL
  Filled 2020-09-15: qty 1

## 2020-09-15 MED ORDER — ONDANSETRON HCL 4 MG PO TABS: 4.0000 mg | ORAL_TABLET | Freq: Four times a day (QID) | ORAL | Status: DC | PRN

## 2020-09-15 MED ORDER — LORAZEPAM 2 MG/ML IJ SOLN
0.5000 mg | Freq: Once | INTRAMUSCULAR | Status: AC
  Administered 2020-09-22: 0.5 mg via INTRAVENOUS
  Filled 2020-09-15: qty 1

## 2020-09-15 MED ORDER — SODIUM CHLORIDE 0.9 % IV BOLUS
1000.0000 mL | Freq: Once | INTRAVENOUS | Status: AC
  Administered 2020-09-15: 1000 mL via INTRAVENOUS

## 2020-09-15 MED ORDER — ALBUTEROL SULFATE (2.5 MG/3ML) 0.083% IN NEBU: 2.5000 mg | INHALATION_SOLUTION | RESPIRATORY_TRACT | Status: DC | PRN

## 2020-09-15 MED ORDER — POTASSIUM CHLORIDE CRYS ER 10 MEQ PO TBCR
10.0000 meq | EXTENDED_RELEASE_TABLET | Freq: Two times a day (BID) | ORAL | Status: DC
  Administered 2020-09-15 – 2020-09-16 (×2): 10 meq via ORAL
  Filled 2020-09-15 (×2): qty 1

## 2020-09-15 MED ORDER — POLYETHYLENE GLYCOL 3350 17 G PO PACK: 17.0000 g | PACK | Freq: Every day | ORAL | Status: DC | PRN

## 2020-09-15 MED ORDER — AMLODIPINE BESYLATE 5 MG PO TABS
5.0000 mg | ORAL_TABLET | Freq: Every day | ORAL | Status: DC
  Administered 2020-09-16: 5 mg via ORAL
  Filled 2020-09-15: qty 1

## 2020-09-15 NOTE — ED Provider Notes (Addendum)
[facility] EMERGENCY DEPARTMENT Provider Note   CSN: [id number] Arrival date & time: 09/15/20  1045     History No chief complaint on file.   Jennifer [name] is a 80 y.o. female.  Pt presents to the ED today with weakness, frequent falls, and confusion.  Her sister-in-law helps with hx.  Pt was living on her own until a few days ago.  She did see her pcp on 5/17.  PCP started Lexapro, ordered a MRI of brain, CT chest, and a consult to pulm.  Pt's brother and sister-in-law went to visit her on 5/23.  They could see evidence of multiple falls.  So, they took her back to their house.  Pt is eating very little.  She is still having some falls.  Pt's pcp advised coming to the ED.         Past Medical History:  Diagnosis Date  . ANEMIA-NOS   . Cataract    right cataract removed  . Clotting disorder (HCC)    Pletal  . Depression    no longer taking meds/ resolved  . DYSLIPIDEMIA   . GERD (gastroesophageal reflux disease)   . Hemorrhoids   . HYPERTENSION   . Negative colorectal cancer screening using Cologuard test   . Osteoporosis, senile    DEXA 03/2014: -3.3 R fem  . Peripheral vascular disease, unspecified (HCC) dx 02/2010 ABI  . SMOKER     Patient Active Problem List   Diagnosis Date Noted  . Weight loss 09/07/2020  . SOB (shortness of breath) on exertion 09/07/2020  . Gait abnormality 09/07/2020  . Dizziness after extension of neck 09/07/2020  . Chronic low back pain 07/05/2020  . Aortic atherosclerosis (HCC) 06/15/2020  . Malnutrition (HCC) 09/03/2019  . Sacral insufficiency fracture 08/04/2019  . Vitamin D deficiency 08/04/2019  . Closed compression fracture of L1 vertebra (HCC) 08/01/2019  . Back pain 07/05/2019  . Lumbar radiculopathy 06/27/2019  . Lung nodule 06/10/2019  . Abdominal aortic aneurysm (AAA) (HCC) 06/10/2019  . Chest pain, musculoskeletal 06/10/2019  . Ascending aortic aneurysm (HCC) 06/10/2019  . Hiatal hernia, large  01/09/2018  . GERD (gastroesophageal reflux disease) 12/08/2016  . History of Clostridium difficile infection 11/29/2016  . Prediabetes 11/02/2016  . Carotid arterial disease (HCC) 10/20/2015  . Anxiety state   . PAD (peripheral artery disease) (HCC) 10/10/2011  . Osteoporosis, senile 05/17/2011  . Dyslipidemia 05/03/2009  . Tobacco abuse 04/14/2009  . Essential hypertension 04/14/2009    Past Surgical History:  Procedure Laterality Date  . CATARACT EXTRACTION W/ INTRAOCULAR LENS IMPLANT Right 03/12/14   groat  . HEMORRHOID SURGERY  05/18/2011   Procedure: HEMORRHOIDECTOMY;  Surgeon: Robyne Askew, MD;  Location: [facility];  Service: General;  Laterality: N/A;  hemorrhoidectomy, three columns, internal and external  . IR KYPHO LUMBAR INC FX REDUCE BONE BX UNI/BIL CANNULATION INC/IMAGING  07/11/2019  . TONSILLECTOMY  1946  . TUBAL LIGATION  1979  . UPPER GASTROINTESTINAL ENDOSCOPY  01/03/2018     OB History   No obstetric history on file.     Family History  Problem Relation Age of Onset  . Breast cancer Mother   . Lung cancer Father   . Hypertension Brother   . Colon cancer Neg Hx   . Esophageal cancer Neg Hx   . Stomach cancer Neg Hx   . Rectal cancer Neg Hx     Social History   Tobacco Use  . Smoking status: Light Tobacco  Smoker    Packs/day: 0.50    Years: 62.00    Pack years: 31.00    Types: Cigarettes    Last attempt to quit: 07/15/2019    Years since quitting: 1.1  . Smokeless tobacco: Never Used  Vaping Use  . Vaping Use: Never used  Substance Use Topics  . Alcohol use: Yes    Alcohol/week: 0.0 standard drinks    Comment: Social- 1 scotch, 1 glass wine weekly  . Drug use: No    Home Medications Prior to Admission medications   Medication Sig Start Date End Date Taking? Authorizing Provider  ALPRAZolam (XANAX) 0.25 MG tablet TAKE 1 TABLET BY MOUTH EVERY NIGHT AT BEDTIME AS NEEDED FOR ANXIETY OR SLEEP 08/20/20   Burns, Bobette MoStacy J, MD  amLODipine (NORVASC)  5 MG tablet Take 1 tablet (5 mg total) by mouth daily. 03/03/20 06/01/20  Kathleene HazelMcAlhany, Christopher D, MD  aspirin 81 MG tablet Take 81 mg by mouth daily before breakfast.    [provider]  atorvastatin (LIPITOR) 40 MG tablet Take 1 tablet (40 mg total) by mouth daily. 07/09/20   Pincus SanesBurns, Stacy J, MD  calcium-vitamin D (OSCAL WITH D) 500-200 MG-UNIT per tablet Take 2 tablets by mouth daily.    [provider]  Cholecalciferol (VITAMIN D) 50 MCG (2000 UT) CAPS Take by mouth.    [provider]  cilostazol (PLETAL) 50 MG tablet TAKE 1 TABLET(50 MG) BY MOUTH TWICE DAILY 01/21/20   Burns, Bobette MoStacy J, MD  escitalopram (LEXAPRO) 5 MG tablet Take 1 tablet (5 mg total) by mouth daily. 09/07/20   Pincus SanesBurns, Stacy J, MD  omeprazole (PRILOSEC) 40 MG capsule Take 1 capsule (40 mg total) by mouth daily. 04/01/20   Hilarie FredricksonPerry, John N, MD  potassium chloride SA (KLOR-CON) 10 MEQ tablet Take 2 tablets (20 mEq total) by mouth daily. 08/23/20   Pincus SanesBurns, Stacy J, MD    Allergies    Patient has no known allergies.  Review of Systems   Review of Systems  Constitutional: Positive for appetite change.  Neurological: Positive for weakness.  All other systems reviewed and are negative.   Physical Exam Updated Vital Signs BP (!) 146/64   Pulse 70   Temp 97.8 F (36.6 C)   Resp 19   SpO2 98%   Physical Exam Vitals and nursing note reviewed.  Constitutional:      Appearance: She is underweight.  HENT:     Head: Normocephalic and atraumatic.     Right Ear: External ear normal.     Left Ear: External ear normal.     Nose: Nose normal.     Mouth/Throat:     Mouth: Mucous membranes are moist.     Pharynx: Oropharynx is clear.  Eyes:     Extraocular Movements: Extraocular movements intact.     Conjunctiva/sclera: Conjunctivae normal.     Pupils: Pupils are equal, round, and reactive to light.  Cardiovascular:     Rate and Rhythm: Normal rate and regular rhythm.     Pulses: Normal pulses.     Heart  sounds: Normal heart sounds.  Pulmonary:     Effort: Pulmonary effort is normal.     Breath sounds: Normal breath sounds.  Abdominal:     General: Abdomen is flat. Bowel sounds are normal.     Palpations: Abdomen is soft.  Musculoskeletal:        General: Normal range of motion.     Cervical back: Normal range of motion and  neck supple.  Skin:    General: Skin is warm.     Capillary Refill: Capillary refill takes less than 2 seconds.  Neurological:     General: No focal deficit present.     Mental Status: She is alert and oriented to person, place, and time.  Psychiatric:        Mood and Affect: Mood normal.        Behavior: Behavior normal.        Thought Content: Thought content normal.        Judgment: Judgment normal.     ED Results / Procedures / Treatments   Labs (all labs ordered are listed, but only abnormal results are displayed) Labs Reviewed  CBC - Abnormal; Notable for the following components:      Result Value   Hemoglobin 11.9 (*)    MCH 25.7 (*)    RDW 16.5 (*)    Platelets 98 (*)    All other components within normal limits  COMPREHENSIVE METABOLIC PANEL - Abnormal; Notable for the following components:   Potassium 3.1 (*)    Glucose, Bld 101 (*)    BUN 33 (*)    Creatinine, Ser 1.47 (*)    Albumin 3.4 (*)    GFR, Estimated 35 (*)    All other components within normal limits  RESP PANEL BY RT-PCR (FLU A&B, COVID) ARPGX2  URINE CULTURE  MAGNESIUM  TSH  URINALYSIS, ROUTINE W REFLEX MICROSCOPIC  CBG MONITORING, ED    EKG EKG Interpretation  Date/Time:  Wednesday Sep 15 2020 10:53:58 EDT Ventricular Rate:  70 PR Interval:  164 QRS Duration: 72 QT Interval:  416 QTC Calculation: 449 R Axis:   -61 Text Interpretation: Sinus rhythm with marked sinus arrhythmia Left anterior fascicular block Biventricular hypertrophy with repolarization abnormality Abnormal ECG No significant change since last tracing Confirmed by Jacalyn Lefevre [id number]) on  09/15/2020 11:46:53 AM   Radiology DG Chest 2 View  Result Date: 09/15/2020 CLINICAL DATA:  Weakness and shortness of breath. EXAM: CHEST - 2 VIEW COMPARISON:  06/03/2019 FINDINGS: Again noted is kyphosis in the thoracic spine. Both lungs are clear. Heart size is stable. Evidence for hiatal hernia. In the interim, there has been bone cement placed in the L1 vertebral body. Difficult to exclude a new compression deformity at L2. Diffuse atherosclerotic calcifications in the thoracic aorta. IMPRESSION: 1. No acute cardiopulmonary disease. 2. Previous kyphoplasty at L1. Cannot exclude a new compression deformity at L2. Recommend dedicated lumbar spine images if this is an area of clinical concern. Electronically Signed   By: Richarda Overlie M.D.   On: 09/15/2020 11:55   CT Head Wo Contrast  Result Date: 09/15/2020 CLINICAL DATA:  Delirium EXAM: CT HEAD WITHOUT CONTRAST TECHNIQUE: Contiguous axial images were obtained from the base of the skull through the vertex without intravenous contrast. COMPARISON:  None. FINDINGS: Brain: No evidence of acute infarction, hemorrhage, hydrocephalus, extra-axial collection or mass lesion/mass effect. Diffuse cortical atrophy. Periventricular white matter hypodensity is a nonspecific finding, but most commonly relates to chronic ischemic small vessel disease. Vascular: No hyperdense vessel or unexpected calcification. Skull: Smoothly marginated protuberant mass extending laterally from the left side of the calvarium measuring 2.4 x 1.5 cm is likely an osteoma. Legs aggressive characteristics such as periosteal reaction or cortical breakthrough. Sinuses/Orbits: Partial opacification of left anterior ethmoid air cells. Complete opacification left maxillary sinus. Hyperdense content of the left maxillary sinus may be due to inspissated mucus, hemorrhage, or fungal infection. Other:  None. IMPRESSION: No acute intracranial abnormality Electronically Signed   By: Acquanetta Belling M.D.   On:  09/15/2020 13:17   CT Chest Wo Contrast  Result Date: 09/15/2020 CLINICAL DATA:  Persistent cough.  Weakness and shortness of breath. EXAM: CT CHEST WITHOUT CONTRAST TECHNIQUE: Multidetector CT imaging of the chest was performed following the standard protocol without IV contrast. COMPARISON:  Chest radiography same day.  CT angiography 06/03/2019. FINDINGS: Cardiovascular: Cardiomegaly. No pericardial fluid. Extensive coronary artery calcification and/or stents. Extensive aortic atherosclerotic calcification. Maximal diameter of the ascending aorta 4.4 cm, increased by 1 mm from the previous exam. Mediastinum/Nodes: No hilar or mediastinal mass or lymphadenopathy. Large hiatal hernia. Lungs/Pleura: Emphysema and pulmonary scarring as seen previously. Previous 6 mm density in the right upper lobe question previously looks more like a simple scar today. This is not a measurable nodule on today's exam. This is located in the region of image 46. Scarring at both lung apices. No evidence of focal nodule or mass in the right lung. However, in the lateral aspect of the left upper lobe axial image 62, the patient has developed a 1 cm peripheral mass/nodule where there was only a small area of scarring previously. This is viewed with considerable concern given the rate of growth. No pleural effusion on either side. Upper Abdomen: Hiatal hernia as noted previously. Partial visualization of the abdominal aortic aneurysm measuring up to 6.6 cm in the portion imaged. This was not fully or completely evaluated. Musculoskeletal: Chronic augmented fracture in the upper lumbar region. Old healed sternal fracture. No acute bone finding. IMPRESSION: Emphysema and pulmonary scarring. No sign of pneumonia, collapse or effusion. Newly seen 1 cm mass in the lateral left upper lobe, axial images 61 and 62. This was not present previously and is quite concerning for pulmonary malignancy. Consider one of the following in 3 months for  both low-risk and high-risk individuals: (a) repeat chest CT, (b) follow-up PET-CT, or (c) tissue sampling. This recommendation follows the consensus statement: Guidelines for Management of Incidental Pulmonary Nodules Detected on CT Images: From the Fleischner Society 2017; Radiology 2017; 284:228-243. Previously seen density in the right upper lobe is no longer measurable and looks like a simple scar. Cardiomegaly, coronary artery calcification it advanced atherosclerosis of the aorta. Maximal diameter of the ascending aorta increased from 4.3 cm to 4.4 cm. Recommend annual imaging followup by CTA or MRA. This recommendation follows 2010 ACCF/AHA/AATS/ACR/ASA/SCA/SCAI/SIR/STS/SVM Guidelines for the Diagnosis and Management of Patients with Thoracic Aortic Disease. Circulation. 2010; 121: W098-J191. Aortic aneurysm NOS (ICD10-I71.9) Partial visualization of a large abdominal aortic aneurysm as seen previously, not completely evaluated today. Only the upper portion is visualized. Large hiatal hernia. Aortic Atherosclerosis (ICD10-I70.0) and Emphysema (ICD10-J43.9). Electronically Signed   By: Paulina Fusi M.D.   On: 09/15/2020 14:35    Procedures Procedures   Medications Ordered in ED Medications  sodium chloride 0.9 % bolus 1,000 mL (1,000 mLs Intravenous New Bag/Given 09/15/20 1229)  potassium chloride SA (KLOR-CON) CR tablet 40 mEq (40 mEq Oral Given 09/15/20 1437)    ED Course  I have reviewed the triage vital signs and the nursing notes.  Pertinent labs & imaging results that were available during my care of the patient were reviewed by me and considered in my medical decision making (see chart for details).    MDM Rules/Calculators/A&P  Work up here negative other than hypokalemia.  Pt given 40 meq Kdur here.  Pt's pcp ordered a CT chest and MRI brain which we will do here.  CT chest shows a mass in the LUL.  Pt is a heavy smoker, so she likely does have lung  cancer.  Pt's pcp has already referred to pulmonology.  Pt said she was told in the past that she had a lung mass.  Looking back on old charts, she had a CT chest in 05/2019 which showed a mass.  She also had an aneurysm.  The pt did follow up with vascular about the aneurysm, but not the mass.  Pt will need a SNF or ALF.  TOC and PT consulted.  Pt signed out to Dr. Freida Busman at shift change.   Final Clinical Impression(s) / ED Diagnoses Final diagnoses:  Hypokalemia  Failure to thrive in adult  Mass of upper lobe of left lung  Dehydration    Rx / DC Orders ED Discharge Orders    None       Jacalyn Lefevre, MD 09/15/20 1525    Jacalyn Lefevre, MD 09/15/20 1546

## 2020-09-15 NOTE — Evaluation (Signed)
Physical Therapy Evaluation Patient Details Name: Jennifer [name] MRN: [id number] DOB: [birth year] Today's Date: 09/15/2020   History of Present Illness  Pt is a 80 y.o. F who presents with weakess, frequent falls, and confusion. CT head negative for acute abnormality. MRI pending. Significant PMH: HTN, osteoporosis, L1 compression fx s/p kyphoplasty.  Clinical Impression  Prior to admission, pt has had a functional decline the past ~4 days and has been requiring assist from family for ADL's and ambulating household distances with a RW. Pt typically lives alone. Pt does not demonstrate any focal deficits and denies numbness/tingling. Oriented to self and place. Pt requiring min-mod assist for bed mobility. Requesting to lie back down due to dizziness; BP stable. Pt demonstrates generalized weakness, poor sitting balance, decreased activity tolerance, decreased cognition. Recommend SNF to address deficits, maximize functional mobility and decrease caregiver burden.    Follow Up Recommendations SNF    Equipment Recommendations  3in1 (PT);Wheelchair (measurements PT);Wheelchair cushion (measurements PT)    Recommendations for Other Services       Precautions / Restrictions Precautions Precautions: Fall Restrictions Weight Bearing Restrictions: No      Mobility  Bed Mobility Overal bed mobility: Needs Assistance Bed Mobility: Supine to Sit;Sit to Supine     Supine to sit: Min assist Sit to supine: Mod assist   General bed mobility comments: Pt bringing BLE's off edge of bed, assist for trunk to upright, use of draw sheet to bring hips to edge of stretcher. Assist for BLE's back into bed    Transfers                    Ambulation/Gait                Stairs            Wheelchair Mobility    Modified Rankin (Stroke Patients Only) Modified Rankin (Stroke Patients Only) Pre-Morbid Rankin Score: Moderately severe disability Modified Rankin: Severe  disability     Balance Overall balance assessment: Needs assistance Sitting-balance support: Feet supported Sitting balance-Leahy Scale: Poor Sitting balance - Comments: Right lateral and posterior lean. Able to correct with multimodal cues but not maintain                                     Pertinent Vitals/Pain Pain Assessment: No/denies pain    Home Living Family/patient expects to be discharged to:: Private residence Living Arrangements: Alone Available Help at Discharge: Family;Available PRN/intermittently Type of Home: House Home Access: Stairs to enter Entrance Stairs-Rails: Doctor, general practice of Steps: 3 Home Layout: One level Home Equipment: Emergency planning/management officer - 2 wheels      Prior Function Level of Independence: Needs assistance   Gait / Transfers Assistance Needed: uses RW, requires assist past 4 days, frequent falls  ADL's / Homemaking Assistance Needed: difficulty performing ADL's last 4 days, prior was independent.        Hand Dominance        Extremity/Trunk Assessment   Upper Extremity Assessment Upper Extremity Assessment: RUE deficits/detail;LUE deficits/detail RUE Deficits / Details: Strength 4/5 LUE Deficits / Details: Strength 4/5    Lower Extremity Assessment Lower Extremity Assessment: RLE deficits/detail;LLE deficits/detail RLE Deficits / Details: Strength 4/5 LLE Deficits / Details: Strength 4/5    Cervical / Trunk Assessment Cervical / Trunk Assessment: Kyphotic  Communication   Communication: No difficulties  Cognition Arousal/Alertness: Lethargic Behavior During Therapy:  Flat affect Overall Cognitive Status: Impaired/Different from baseline Area of Impairment: Orientation;Memory;Safety/judgement;Awareness                 Orientation Level: Disoriented to;Time   Memory: Decreased short-term memory   Safety/Judgement: Decreased awareness of safety;Decreased awareness of  deficits Awareness: Intellectual   General Comments: Pt not oriented to time, stating it was "April or July." able to correctly guess year with options. Pt stating, "I guess I'm at [facility]." Vague recollection of events surrounding past few days. Pt SIL reports this is acute confusion for pt      General Comments      Exercises     Assessment/Plan    PT Assessment Patient needs continued PT services  PT Problem List Decreased strength;Decreased activity tolerance;Decreased balance;Decreased mobility;Decreased cognition;Decreased safety awareness       PT Treatment Interventions DME instruction;Gait training;Therapeutic activities;Functional mobility training;Therapeutic exercise;Balance training;Patient/family education    PT Goals (Current goals can be found in the Care Plan section)  Acute Rehab PT Goals Patient Stated Goal: pt would like to get stronger PT Goal Formulation: With patient Time For Goal Achievement: 09/29/20 Potential to Achieve Goals: Fair    Frequency Min 3X/week   Barriers to discharge        Co-evaluation               AM-PAC PT "6 Clicks" Mobility  Outcome Measure Help needed turning from your back to your side while in a flat bed without using bedrails?: A Little Help needed moving from lying on your back to sitting on the side of a flat bed without using bedrails?: A Little Help needed moving to and from a bed to a chair (including a wheelchair)?: A Lot Help needed standing up from a chair using your arms (e.g., wheelchair or bedside chair)?: A Lot Help needed to walk in hospital room?: A Lot Help needed climbing 3-5 steps with a railing? : Total 6 Click Score: 13    End of Session   Activity Tolerance: Patient limited by fatigue Patient left: in bed;with call bell/phone within reach;with family/visitor present Nurse Communication: Mobility status PT Visit Diagnosis: Other abnormalities of gait and mobility (R26.89);Muscle  weakness (generalized) (M62.81)    Time: 5885-0277 PT Time Calculation (min) (ACUTE ONLY): 23 min   Charges:   PT Evaluation $PT Eval Moderate Complexity: 1 Mod PT Treatments $Therapeutic Activity: 8-22 mins        Lillia Pauls, PT, DPT Acute Rehabilitation Services Pager [phone] Office [phone]  Norval Morton 09/15/2020, 5:10 PM

## 2020-09-15 NOTE — ED Notes (Signed)
Patient transported to MRI 

## 2020-09-15 NOTE — Telephone Encounter (Signed)
Patients sister called and said that they will be taking the patient to the hospital. Please advise

## 2020-09-15 NOTE — H&P (Signed)
History and Physical    Jennifer [name] ERD:[id number] DOB: 08/24/1940 DOA: 09/15/2020  PCP: Pincus Sanes, MD Consultants:  [facility] - cardiology; Marina Goodell - GIEthelene Hal - orthopedics; Edilia Bo - vascular Patient coming from:  Home - lives alone; NOK: [name] and Louanna Vanliew, [phone]  Chief Complaint:    HPI: Jennifer [name] is a 80 y.o. female with medical history significant of PVD; HTN; and HLD presenting with weakness.  She has been feeling terribly.  She woke up this AM and felt terrible, had no energy.  She felt ok yesterday.  She was tired and weak.  She had a very bad night - she was moaning a lot and calling out and wanting to get out of bed.  She was also incontinent.  She has a lot of back problems.  On a usual day she is able to get up, go to [employer] to check out books, go to the grocery.  She last went to [employer] maybe a week ago and grocery shopped maybe 2 weeks ago.  She hasn't had much of an appetite for the last 2 weeks but "she's never a good eater."  She has lost maybe 10 pounds over maybe a couple of weeks.  She had lost 8 pounds between her last 2 PCP visits.  Intermittent nausea with vomiting; emesis yesterday x 1.  Mild SOB when running errands.  Family has noticed some memory issues recently over the last weeks, moreso in the last few days.  Family took her home with them on Monday night due to concerns.  Family has noticed that she stopped eating more than a week ago.  She acknowledges depressed mood, passive suicidal ideation.    ED Course: FTT, dehydration, weakness.  In MRI now.  Review of Systems: As per HPI; otherwise review of systems reviewed and negative.   Ambulatory Status:  Ambulates without assistance  COVID Vaccine Status:  Complete  Past Medical History:  Diagnosis Date  . ANEMIA-NOS   . Cataract    right cataract removed  . Clotting disorder (HCC)    Pletal  . Depression    no longer taking meds/ resolved  . DYSLIPIDEMIA   . GERD  (gastroesophageal reflux disease)   . Hemorrhoids   . HYPERTENSION   . Negative colorectal cancer screening using Cologuard test   . Osteoporosis, senile    DEXA 03/2014: -3.3 R fem  . Peripheral vascular disease, unspecified (HCC) dx 02/2010 ABI  . SMOKER     Past Surgical History:  Procedure Laterality Date  . CATARACT EXTRACTION W/ INTRAOCULAR LENS IMPLANT Right 03/12/14   groat  . HEMORRHOID SURGERY  05/18/2011   Procedure: HEMORRHOIDECTOMY;  Surgeon: Robyne Askew, MD;  Location: [facility];  Service: General;  Laterality: N/A;  hemorrhoidectomy, three columns, internal and external  . IR KYPHO LUMBAR INC FX REDUCE BONE BX UNI/BIL CANNULATION INC/IMAGING  07/11/2019  . TONSILLECTOMY  1946  . TUBAL LIGATION  1979  . UPPER GASTROINTESTINAL ENDOSCOPY  01/03/2018    Social History   Socioeconomic History  . Marital status: Single    Spouse name: Not on file  . Number of children: Not on file  . Years of education: Not on file  . Highest education level: Not on file  Occupational History  . Occupation: retired  Tobacco Use  . Smoking status: Current Every Day Smoker    Packs/day: 0.50    Years: 62.00    Pack years: 31.00  Types: Cigarettes    Last attempt to quit: 07/15/2019    Years since quitting: 1.1  . Smokeless tobacco: Never Used  Vaping Use  . Vaping Use: Never used  Substance and Sexual Activity  . Alcohol use: Yes    Alcohol/week: 0.0 standard drinks    Comment: Social- 1 scotch, 1 glass wine weekly  . Drug use: No  . Sexual activity: Not Currently    Birth control/protection: Post-menopausal  Other Topics Concern  . Not on file  Social History Narrative   Single, lives alone. Retired Pensions consultant. Enjoys Agricultural consultant work.   Social Determinants of Health   Financial Resource Strain: Low Risk   . Difficulty of Paying Living Expenses: Not hard at all  Food Insecurity: Not on file  Transportation Needs: Not on file  Physical Activity: Not on file   Stress: Not on file  Social Connections: Not on file  Intimate Partner Violence: Not on file    No Known Allergies  Family History  Problem Relation Age of Onset  . Breast cancer Mother   . Lung cancer Father   . Hypertension Brother   . Colon cancer Neg Hx   . Esophageal cancer Neg Hx   . Stomach cancer Neg Hx   . Rectal cancer Neg Hx     Prior to Admission medications   Medication Sig Start Date End Date Taking? Authorizing Provider  ALPRAZolam (XANAX) 0.25 MG tablet TAKE 1 TABLET BY MOUTH EVERY NIGHT AT BEDTIME AS NEEDED FOR ANXIETY OR SLEEP 08/20/20   Burns, Bobette Mo, MD  amLODipine (NORVASC) 5 MG tablet Take 1 tablet (5 mg total) by mouth daily. 03/03/20 06/01/20  Kathleene Hazel, MD  aspirin 81 MG tablet Take 81 mg by mouth daily before breakfast.    [provider]  atorvastatin (LIPITOR) 40 MG tablet Take 1 tablet (40 mg total) by mouth daily. 07/09/20   Pincus Sanes, MD  calcium-vitamin D (OSCAL WITH D) 500-200 MG-UNIT per tablet Take 2 tablets by mouth daily.    [provider]  Cholecalciferol (VITAMIN D) 50 MCG (2000 UT) CAPS Take by mouth.    [provider]  cilostazol (PLETAL) 50 MG tablet TAKE 1 TABLET(50 MG) BY MOUTH TWICE DAILY 01/21/20   Burns, Bobette Mo, MD  escitalopram (LEXAPRO) 5 MG tablet Take 1 tablet (5 mg total) by mouth daily. 09/07/20   Pincus Sanes, MD  omeprazole (PRILOSEC) 40 MG capsule Take 1 capsule (40 mg total) by mouth daily. 04/01/20   Hilarie Fredrickson, MD  potassium chloride SA (KLOR-CON) 10 MEQ tablet Take 2 tablets (20 mEq total) by mouth daily. 08/23/20   Pincus Sanes, MD    Physical Exam: Vitals:   09/15/20 1345 09/15/20 1430 09/15/20 1445 09/15/20 1600  BP: (!) 149/70 136/66 (!) 146/64 137/67  Pulse: 63 61 70 61  Resp: 19  19 (!) 28  Temp:    97.6 F (36.4 C)  TempSrc:    Oral  SpO2: 97% 98% 98% 98%     . General:  Appears frail, withdrawn, eyes closed throughout unless asked to open them . Eyes:   PERRL, EOMI, normal lids, iris . ENT:  grossly normal hearing, lips & tongue, mmm; edentulous . Neck:  no LAD, masses or thyromegaly . Cardiovascular:  RRR, no m/r/g. No LE edema.  Marland Kitchen Respiratory:   CTA bilaterally with no wheezes/rales/rhonchi.  Normal respiratory effort. . Abdomen:  soft, NT, ND . Skin:  no rash or  induration seen on limited exam . Musculoskeletal:  grossly normal tone BUE/BLE, good ROM, no bony abnormality . Lower extremity:  No LE edema.  2+ distal pulses. Marland Kitchen Psychiatric:  flat mood and affect, speech fluent and appropriate . Neurologic:  CN 2-12 grossly intact, moves all extremities in coordinated fashion    Radiological Exams on Admission: Independently reviewed - see discussion in A/P where applicable  DG Chest 2 View  Result Date: 09/15/2020 CLINICAL DATA:  Weakness and shortness of breath. EXAM: CHEST - 2 VIEW COMPARISON:  06/03/2019 FINDINGS: Again noted is kyphosis in the thoracic spine. Both lungs are clear. Heart size is stable. Evidence for hiatal hernia. In the interim, there has been bone cement placed in the L1 vertebral body. Difficult to exclude a new compression deformity at L2. Diffuse atherosclerotic calcifications in the thoracic aorta. IMPRESSION: 1. No acute cardiopulmonary disease. 2. Previous kyphoplasty at L1. Cannot exclude a new compression deformity at L2. Recommend dedicated lumbar spine images if this is an area of clinical concern. Electronically Signed   By: Richarda Overlie M.D.   On: 09/15/2020 11:55   CT Head Wo Contrast  Result Date: 09/15/2020 CLINICAL DATA:  Delirium EXAM: CT HEAD WITHOUT CONTRAST TECHNIQUE: Contiguous axial images were obtained from the base of the skull through the vertex without intravenous contrast. COMPARISON:  None. FINDINGS: Brain: No evidence of acute infarction, hemorrhage, hydrocephalus, extra-axial collection or mass lesion/mass effect. Diffuse cortical atrophy. Periventricular white matter hypodensity is a  nonspecific finding, but most commonly relates to chronic ischemic small vessel disease. Vascular: No hyperdense vessel or unexpected calcification. Skull: Smoothly marginated protuberant mass extending laterally from the left side of the calvarium measuring 2.4 x 1.5 cm is likely an osteoma. Legs aggressive characteristics such as periosteal reaction or cortical breakthrough. Sinuses/Orbits: Partial opacification of left anterior ethmoid air cells. Complete opacification left maxillary sinus. Hyperdense content of the left maxillary sinus may be due to inspissated mucus, hemorrhage, or fungal infection. Other: None. IMPRESSION: No acute intracranial abnormality Electronically Signed   By: Acquanetta Belling M.D.   On: 09/15/2020 13:17   CT Chest Wo Contrast  Result Date: 09/15/2020 CLINICAL DATA:  Persistent cough.  Weakness and shortness of breath. EXAM: CT CHEST WITHOUT CONTRAST TECHNIQUE: Multidetector CT imaging of the chest was performed following the standard protocol without IV contrast. COMPARISON:  Chest radiography same day.  CT angiography 06/03/2019. FINDINGS: Cardiovascular: Cardiomegaly. No pericardial fluid. Extensive coronary artery calcification and/or stents. Extensive aortic atherosclerotic calcification. Maximal diameter of the ascending aorta 4.4 cm, increased by 1 mm from the previous exam. Mediastinum/Nodes: No hilar or mediastinal mass or lymphadenopathy. Large hiatal hernia. Lungs/Pleura: Emphysema and pulmonary scarring as seen previously. Previous 6 mm density in the right upper lobe question previously looks more like a simple scar today. This is not a measurable nodule on today's exam. This is located in the region of image 46. Scarring at both lung apices. No evidence of focal nodule or mass in the right lung. However, in the lateral aspect of the left upper lobe axial image 62, the patient has developed a 1 cm peripheral mass/nodule where there was only a small area of scarring  previously. This is viewed with considerable concern given the rate of growth. No pleural effusion on either side. Upper Abdomen: Hiatal hernia as noted previously. Partial visualization of the abdominal aortic aneurysm measuring up to 6.6 cm in the portion imaged. This was not fully or completely evaluated. Musculoskeletal: Chronic augmented fracture in the  upper lumbar region. Old healed sternal fracture. No acute bone finding. IMPRESSION: Emphysema and pulmonary scarring. No sign of pneumonia, collapse or effusion. Newly seen 1 cm mass in the lateral left upper lobe, axial images 61 and 62. This was not present previously and is quite concerning for pulmonary malignancy. Consider one of the following in 3 months for both low-risk and high-risk individuals: (a) repeat chest CT, (b) follow-up PET-CT, or (c) tissue sampling. This recommendation follows the consensus statement: Guidelines for Management of Incidental Pulmonary Nodules Detected on CT Images: From the Fleischner Society 2017; Radiology 2017; 284:228-243. Previously seen density in the right upper lobe is no longer measurable and looks like a simple scar. Cardiomegaly, coronary artery calcification it advanced atherosclerosis of the aorta. Maximal diameter of the ascending aorta increased from 4.3 cm to 4.4 cm. Recommend annual imaging followup by CTA or MRA. This recommendation follows 2010 ACCF/AHA/AATS/ACR/ASA/SCA/SCAI/SIR/STS/SVM Guidelines for the Diagnosis and Management of Patients with Thoracic Aortic Disease. Circulation. 2010; 121: Z610-R604. Aortic aneurysm NOS (ICD10-I71.9) Partial visualization of a large abdominal aortic aneurysm as seen previously, not completely evaluated today. Only the upper portion is visualized. Large hiatal hernia. Aortic Atherosclerosis (ICD10-I70.0) and Emphysema (ICD10-J43.9). Electronically Signed   By: Paulina Fusi M.D.   On: 09/15/2020 14:35    EKG: Independently reviewed.  NSR with significant sinus  arrhythmia with rate 70; LVH; LAFB; nonspecific ST changes with no evidence of acute ischemia; NSCSLT   Labs on Admission: I have personally reviewed the available labs and imaging studies at the time of the admission.  Pertinent labs:   K+ 3.1 BUN 33/Creatinine 1.47/GFR 35; 13/1.01/52 on 5/17 WBC 9.6 Hgb 11.9 Platelets 98 Normal TSH COVID/flu negative   Assessment/Plan Principal Problem:   Failure to thrive in adult Active Problems:   Dyslipidemia   Tobacco abuse   Essential hypertension   PAD (peripheral artery disease) (HCC)   Failure to thrive with mild AKI -Patient presenting with decreased appetite, depressed mood, unintentional weight loss, lack of energy -There is family concern for memory loss, although this may also be related to depression -While there may be an organic and reversible process in play (such as UTI- UA pending but no other real symptoms; CVA - MRI ordered by EDP), it appears more likely that her presentation is related to mood/memory -AKI related to poor PO intake -Will observe with PT/OT/ST/nutrition evaluations -Her family is providing 24/7 caregiver assistance and yet she is requiring more care over time; she may benefit from SNF placement -Psychiatry evaluation; in the meantime I have ordered Seroquel for qhs dosing (her SIL noted agitation while in the ER prior to my evaluation) and will also continue her home Lexapro and prn Xanax qhs -Will request palliative care evaluation for goals of care  HTN -Continue Norvasc  HLD -Continue Lipitor  PVD -Continue ASA, Pletal  Hypokalemia -Replete -Recheck BMP in AM -Continue home KCl, may need more given poor PO intake  Malnutrition -BMI 14.6 -The patient has at least 2 indicators for malnutrition (insufficient energy intake, weight loss, loss of muscle mass, diminished functional status. -This is likely due to starvation related   -Will obtain a nutrition consult for further  recommendations.  Tobacco dependence -Encourage cessation.   -This was discussed with the patient and should be reviewed on an ongoing basis.   -Patch ordered for prn use.    Note: This patient has been tested and is negative for the novel coronavirus COVID-19. The patient has been fully vaccinated against  COVID-19.   Level of care: Telemetry Medical DVT prophylaxis:  Lovenox Code Status: DNR - confirmed with patient/family Family Communication: Sister-in-law was present throughout evaluation Disposition Plan:  The patient is from: home  Anticipated d/c is to: be determined  Anticipated d/c date will depend on clinical response to treatment, but possibly as early as tomorrow if she has excellent response to treatment  Patient is currently: acutely ill Consults called: Psychiatry; Palliative care; PT/OT/ST/Nutriton/TOC team Admission status:  It is my clinical opinion that referral for OBSERVATION is reasonable and necessary in this patient based on the above information provided. The aforementioned taken together are felt to place the patient at high risk for further clinical deterioration. However it is anticipated that the patient may be medically stable for discharge from the hospital within 24 to 48 hours.    Jonah BlueJennifer Sehaj Kolden MD [facility]   How to contact the [facility] Attending or Consulting provider 7A - 7P or covering provider during after hours 7P -7A, for this patient?  1. Check the care team in [facility] and look for a) attending/consulting [facility] provider listed and b) the [facility] team listed 2. Log into [email or web address] and use [facility]'s universal password to access. If you do not have the password, please contact the hospital operator. 3. Locate the [facility] provider you are looking for under [facility] and page to a number that you can be directly reached. 4. If you still have difficulty reaching the provider, please page the [facility] (Director on Call) for the Hospitalists listed  on amion for assistance.   09/15/2020, 5:59 PM

## 2020-09-15 NOTE — ED Notes (Signed)
Went into pt room because pt was hollaring help, informed pt that she has a call bell to call for our help. Pt was sideways in the bed and said she needed help. In moving pt this tech realized pt had pulled out her IV and blood was all over her arm and sheet. Second tech assisted me to turn the pt once I had removed IV tegaderm and covered and taped site. Then changed bed sheet, replaced pt gown and gave pt new warm blankets. RN Cala Bradford aware pt has removed IV and will go in to start new IV as soon as possible

## 2020-09-15 NOTE — ED Provider Notes (Signed)
Patient signed to me by Dr. Particia Nearing pending MRI of brain.  Patient does have evidence of dehydration.  Received a liter of fluid but still feels weak.  MRI is pending but patient will require admission for dehydration.  Will consult hospitalist   Lorre Nick, MD 09/15/20 [phone]

## 2020-09-15 NOTE — ED Provider Notes (Addendum)
Emergency Medicine Provider Triage Evaluation Note  Jennifer [name] , a 80 y.o. female  was evaluated in triage.  Pt complains of weakness, weight loss, fatigue, confusion x 1 week. Lives alone in [place]. Staying with brother and sister in law. Recent falls, urinary incontinence. On appetite and cilostazol. Per sister in law, disoriented.   Review of Systems  Positive: Urinary incontinence, falls, weakness, vomiting, frequency  Negative: Fevers, chills, decreased appetite, dysuria  Physical Exam  BP 137/75 (BP Location: Right Arm)   Pulse 78   Temp 97.8 F (36.6 C)   Resp 14   SpO2 100%  Gen:   Awake, no distress   Resp:  Normal effort  MSK:   Moves extremities without difficulty  Other:  Abdomen soft, nondistended, nontender.   Medical Decision Making  Medically screening exam initiated at 11:05 AM.  Appropriate orders placed.  Jennifer [name] was informed that the remainder of the evaluation will be completed by another provider, this initial triage assessment does not replace that evaluation, and the importance of remaining in the ED until their evaluation is complete.  This chart was dictated using voice recognition software, Dragon. Despite the best efforts of this provider to proofread and correct errors, errors may still occur which can change documentation meaning.    [name], PA-C 09/15/20 1106    [name], [name], PA-C 09/15/20 1108    [name], [name] 09/15/20 1636

## 2020-09-15 NOTE — ED Triage Notes (Signed)
Patient complains of shortness of breath and increased weakness with no energy. Lives alone and family concerned with weakness and increased weight loss. Also had fall Sunday night

## 2020-09-15 NOTE — Telephone Encounter (Signed)
Patient currently at the ED as of now.

## 2020-09-16 ENCOUNTER — Inpatient Hospital Stay (HOSPITAL_COMMUNITY): Payer: Medicare Other

## 2020-09-16 DIAGNOSIS — I6522 Occlusion and stenosis of left carotid artery: Secondary | ICD-10-CM | POA: Diagnosis present

## 2020-09-16 DIAGNOSIS — Z720 Tobacco use: Secondary | ICD-10-CM

## 2020-09-16 DIAGNOSIS — D6859 Other primary thrombophilia: Secondary | ICD-10-CM | POA: Diagnosis present

## 2020-09-16 DIAGNOSIS — I82452 Acute embolism and thrombosis of left peroneal vein: Secondary | ICD-10-CM | POA: Diagnosis present

## 2020-09-16 DIAGNOSIS — N179 Acute kidney failure, unspecified: Secondary | ICD-10-CM | POA: Diagnosis present

## 2020-09-16 DIAGNOSIS — N390 Urinary tract infection, site not specified: Secondary | ICD-10-CM | POA: Diagnosis present

## 2020-09-16 DIAGNOSIS — I6622 Occlusion and stenosis of left posterior cerebral artery: Secondary | ICD-10-CM | POA: Diagnosis not present

## 2020-09-16 DIAGNOSIS — D689 Coagulation defect, unspecified: Secondary | ICD-10-CM | POA: Diagnosis present

## 2020-09-16 DIAGNOSIS — I10 Essential (primary) hypertension: Secondary | ICD-10-CM | POA: Diagnosis present

## 2020-09-16 DIAGNOSIS — E43 Unspecified severe protein-calorie malnutrition: Secondary | ICD-10-CM

## 2020-09-16 DIAGNOSIS — E876 Hypokalemia: Secondary | ICD-10-CM | POA: Diagnosis not present

## 2020-09-16 DIAGNOSIS — R918 Other nonspecific abnormal finding of lung field: Secondary | ICD-10-CM | POA: Diagnosis not present

## 2020-09-16 DIAGNOSIS — I6523 Occlusion and stenosis of bilateral carotid arteries: Secondary | ICD-10-CM | POA: Diagnosis not present

## 2020-09-16 DIAGNOSIS — I6381 Other cerebral infarction due to occlusion or stenosis of small artery: Secondary | ICD-10-CM | POA: Diagnosis not present

## 2020-09-16 DIAGNOSIS — I700 Atherosclerosis of aorta: Secondary | ICD-10-CM | POA: Diagnosis present

## 2020-09-16 DIAGNOSIS — R414 Neurologic neglect syndrome: Secondary | ICD-10-CM | POA: Diagnosis present

## 2020-09-16 DIAGNOSIS — R627 Adult failure to thrive: Secondary | ICD-10-CM | POA: Diagnosis not present

## 2020-09-16 DIAGNOSIS — I639 Cerebral infarction, unspecified: Secondary | ICD-10-CM

## 2020-09-16 DIAGNOSIS — I6340 Cerebral infarction due to embolism of unspecified cerebral artery: Secondary | ICD-10-CM | POA: Diagnosis not present

## 2020-09-16 DIAGNOSIS — G9389 Other specified disorders of brain: Secondary | ICD-10-CM | POA: Diagnosis not present

## 2020-09-16 DIAGNOSIS — G8111 Spastic hemiplegia affecting right dominant side: Secondary | ICD-10-CM | POA: Diagnosis present

## 2020-09-16 DIAGNOSIS — I6389 Other cerebral infarction: Secondary | ICD-10-CM

## 2020-09-16 DIAGNOSIS — Z681 Body mass index (BMI) 19 or less, adult: Secondary | ICD-10-CM | POA: Diagnosis not present

## 2020-09-16 DIAGNOSIS — F329 Major depressive disorder, single episode, unspecified: Secondary | ICD-10-CM | POA: Diagnosis present

## 2020-09-16 DIAGNOSIS — Z66 Do not resuscitate: Secondary | ICD-10-CM | POA: Diagnosis present

## 2020-09-16 DIAGNOSIS — I712 Thoracic aortic aneurysm, without rupture: Secondary | ICD-10-CM | POA: Diagnosis present

## 2020-09-16 DIAGNOSIS — Z20822 Contact with and (suspected) exposure to covid-19: Secondary | ICD-10-CM | POA: Diagnosis present

## 2020-09-16 DIAGNOSIS — Z7189 Other specified counseling: Secondary | ICD-10-CM | POA: Diagnosis not present

## 2020-09-16 DIAGNOSIS — I739 Peripheral vascular disease, unspecified: Secondary | ICD-10-CM | POA: Diagnosis present

## 2020-09-16 DIAGNOSIS — Z515 Encounter for palliative care: Secondary | ICD-10-CM | POA: Diagnosis not present

## 2020-09-16 DIAGNOSIS — I63443 Cerebral infarction due to embolism of bilateral cerebellar arteries: Secondary | ICD-10-CM | POA: Diagnosis present

## 2020-09-16 DIAGNOSIS — J439 Emphysema, unspecified: Secondary | ICD-10-CM | POA: Diagnosis present

## 2020-09-16 DIAGNOSIS — I672 Cerebral atherosclerosis: Secondary | ICD-10-CM | POA: Diagnosis not present

## 2020-09-16 DIAGNOSIS — R45851 Suicidal ideations: Secondary | ICD-10-CM | POA: Diagnosis present

## 2020-09-16 DIAGNOSIS — D649 Anemia, unspecified: Secondary | ICD-10-CM | POA: Diagnosis present

## 2020-09-16 DIAGNOSIS — D696 Thrombocytopenia, unspecified: Secondary | ICD-10-CM | POA: Diagnosis present

## 2020-09-16 DIAGNOSIS — E860 Dehydration: Secondary | ICD-10-CM | POA: Diagnosis present

## 2020-09-16 LAB — ECHOCARDIOGRAM COMPLETE
S' Lateral: 2.6 cm
Weight: 1326.29 oz

## 2020-09-16 LAB — URINALYSIS, ROUTINE W REFLEX MICROSCOPIC
Bilirubin Urine: NEGATIVE
Glucose, UA: NEGATIVE mg/dL
Ketones, ur: NEGATIVE mg/dL
Nitrite: NEGATIVE
Protein, ur: NEGATIVE mg/dL
RBC / HPF: >50 RBC/hpf — ABNORMAL HIGH (ref 0–5)
Specific Gravity, Urine: 1.018 (ref 1.005–1.030)
pH: 7 (ref 5.0–8.0)

## 2020-09-16 LAB — MAGNESIUM: Magnesium: 1.8 mg/dL (ref 1.7–2.4)

## 2020-09-16 LAB — CBC
HCT: 27 % — ABNORMAL LOW (ref 36.0–46.0)
Hemoglobin: 9 g/dL — ABNORMAL LOW (ref 12.0–15.0)
MCH: 26.2 pg (ref 26.0–34.0)
MCHC: 33.3 g/dL (ref 30.0–36.0)
MCV: 78.5 fL — ABNORMAL LOW (ref 80.0–100.0)
Platelets: 72 10*3/uL — ABNORMAL LOW (ref 150–400)
RBC: 3.44 MIL/uL — ABNORMAL LOW (ref 3.87–5.11)
RDW: 16.4 % — ABNORMAL HIGH (ref 11.5–15.5)
WBC: 7.4 10*3/uL (ref 4.0–10.5)
nRBC: 0 % (ref 0.0–0.2)

## 2020-09-16 LAB — BASIC METABOLIC PANEL
Anion gap: 9 (ref 5–15)
BUN: 26 mg/dL — ABNORMAL HIGH (ref 8–23)
CO2: 22 mmol/L (ref 22–32)
Calcium: 8.2 mg/dL — ABNORMAL LOW (ref 8.9–10.3)
Chloride: 107 mmol/L (ref 98–111)
Creatinine, Ser: 1.12 mg/dL — ABNORMAL HIGH (ref 0.44–1.00)
GFR, Estimated: 49 mL/min — ABNORMAL LOW (ref >60)
Glucose, Bld: 93 mg/dL (ref 70–99)
Potassium: 2.7 mmol/L — CL (ref 3.5–5.1)
Sodium: 138 mmol/L (ref 135–145)

## 2020-09-16 LAB — GLUCOSE, CAPILLARY: Glucose-Capillary: 71 mg/dL (ref 70–99)

## 2020-09-16 LAB — CK: Total CK: 111 U/L (ref 38–234)

## 2020-09-16 IMAGING — CT CT ANGIO HEAD-NECK (W OR W/O PERF)
1 of 11 series · 5 of 33 positions shown · IV contrast (omnipaque)
Comparison: Head CT and MRI [DATE]

CLINICAL DATA: Stroke.

EXAM:
CT ANGIOGRAPHY HEAD AND NECK
TECHNIQUE: Multidetector CT imaging of the head and neck was performed using
the standard protocol during bolus administration of intravenous
contrast. Multiplanar CT image reconstructions and MIPs were
obtained to evaluate the vascular anatomy. Carotid stenosis
measurements (when applicable) are obtained utilizing NASCET
criteria, using the distal internal carotid diameter as the
denominator.
CONTRAST:  50mL OMNIPAQUE IOHEXOL 350 MG/ML SOLN

[Series 12: ax thins · axial · 0.39mm/px · z∈[+972,+1182]mm · 5 of 316 slices shown]
[im 53/316  soft-tissue]
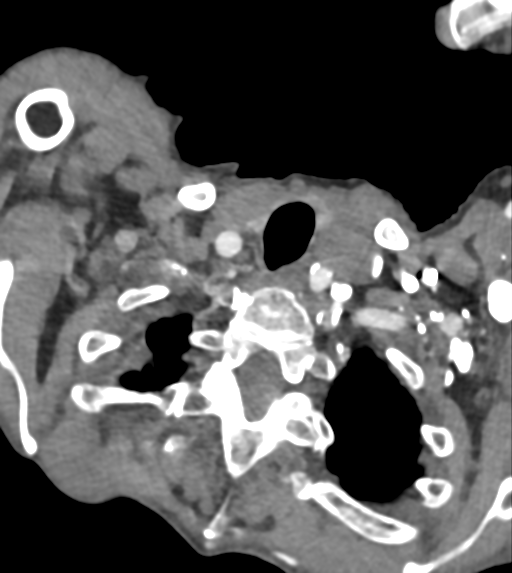
[im 106/316  bone]
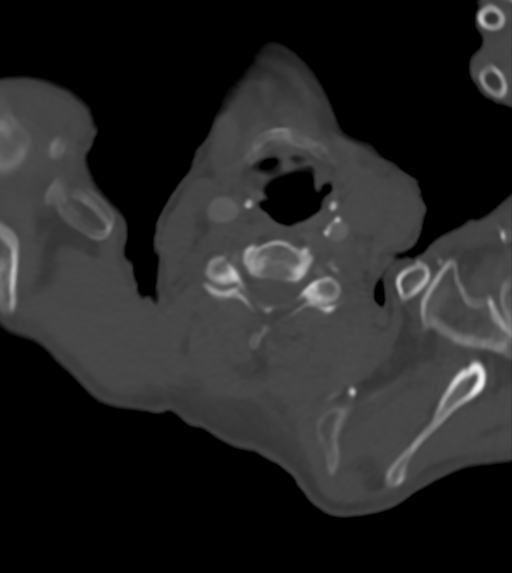
[im 158/316  soft-tissue]
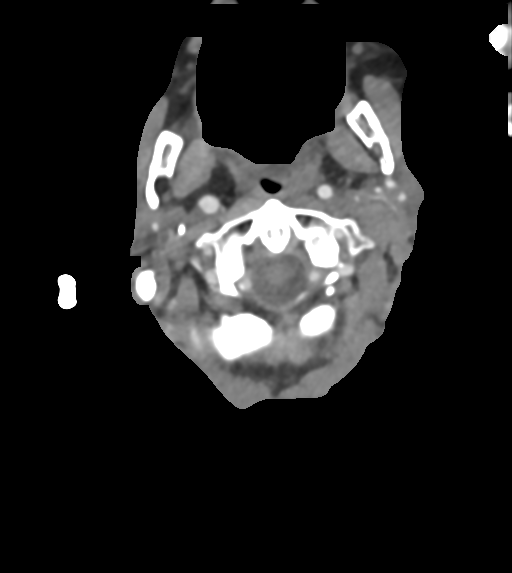
[im 211/316  bone]
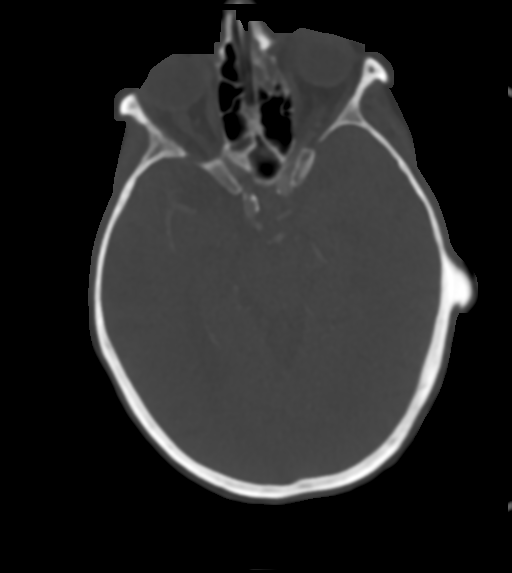
[im 263/316  soft-tissue]
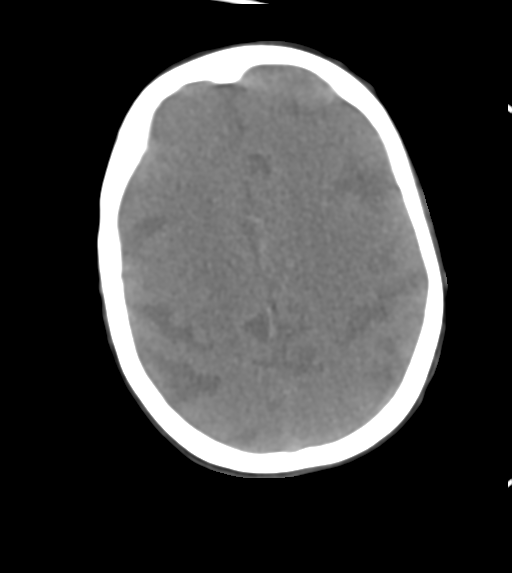

[5 of 33 positions shown; findings below may reference images not displayed]

FINDINGS: CT HEAD FINDINGS

Brain: Numerous scattered small acute infarcts throughout the
cerebrum and cerebellum bilaterally are better demonstrated on
yesterday's MRI. No acute intracranial hemorrhage, mass, midline
shift, or extra-axial fluid collection is identified. There is a
background of moderately advanced chronic small vessel ischemia in
the cerebral white matter. A chronic lacunar infarct is again noted
in the right thalamus. Small chronic cerebellar infarcts shown on
MRI are poorly demonstrated on CT due to motion and skull base
artifact. There is moderate cerebral atrophy.

Vascular: Calcified atherosclerosis at the skull base.

Skull: No fracture within limitations of motion artifact. Left
lateral skull osteoma.

Sinuses: Persistent complete opacification of the left maxillary
sinus and partial opacification of left anterior ethmoid air cells.
No significant mastoid fluid.

Orbits: Right cataract extraction.

Review of the MIP images confirms the above findings

CTA NECK FINDINGS

Aortic arch: Incomplete imaging of the aortic arch including of the
brachiocephalic and left common carotid origins. Diffuse calcified
atherosclerotic plaque in the included portion of the arch and
subclavian arteries. Characterization of a suspected underlying
proximal left subclavian stenosis is limited by motion.

Right carotid system: Patent with a small to moderate amount of
calcified plaque in the proximal common carotid artery not resulting
in a flow limiting stenosis. Prominent calcified plaque at the
carotid bifurcation results in less than 50% stenosis of the origin
of the ICA. Widely patent and mildly ectatic cervical ICA more
distally.

Left carotid system: Patent with moderate calcified plaque
throughout the proximal and mid common carotid artery with
assessment of the proximal common carotid artery limited by motion.
Extensive calcified plaque at the carotid bifurcation resulting in
approximately 65% proximal ICA stenosis with assessment limited by
the heavy calcification of the plaque which results in suboptimal
visualization of the residual patent lumen. Widely patent and mildly
ectatic cervical ICA more distally.

Vertebral arteries: The vertebral arteries are patent and
codominant. Motion artifact limits assessment for stenosis in the V1
and V2 segments.

Skeleton: Advanced lower cervical disc degeneration.

Other neck: Limited assessment of the soft tissues of the neck due
to technique and motion. No gross mass or lymphadenopathy
identified.

Upper chest: Emphysema and scarring in the lung apices.

Review of the MIP images confirms the above findings

CTA HEAD FINDINGS

Anterior circulation: The internal carotid arteries are patent from
skull base to carotid termini with moderate atherosclerotic plaque
bilaterally not resulting in a significant stenosis within
limitations of artifact. Both cavernous segments are tortuous. ACAs
and MCAs are patent without evidence of a proximal branch occlusion.
There is calcified plaque in the proximal right M1 segment without
evidence of an associated significant stenosis. ACA and MCA branch
vessels appear diffusely attenuated and irregular bilaterally, and
branch vessel assessment is limited by motion. No aneurysm is
identified.

Posterior circulation: The intracranial vertebral arteries are
patent to the basilar with atherosclerotic irregularity resulting in
mild multifocal narrowing on the left. There may be more significant
narrowing of the distal right V4 segment although the vessel is
suboptimally visualized due to artifact. The basilar artery is
patent with mild narrowing proximally. There are right larger than
left posterior communicating arteries with a fetal origin noted of
the right PCA. Both PCAs are patent proximally although there is
marked attenuation and irregularity of PCA branch vessels
bilaterally. There is also moderate to severe stenosis of the
proximal left P2 segment. A small calcification is noted at the left
P2 bifurcation. No aneurysm is identified.

Venous sinuses: Not well evaluated due to contrast timing.

Anatomic variants: Fetal right PCA.

Review of the MIP images confirms the above findings
IMPRESSION: 1. Motion degraded study.
2. No emergent large vessel occlusion.
3. Widespread atherosclerosis with 65% stenosis of the proximal left
ICA.
4. Aortic Atherosclerosis ([KI]-[KI]) and Emphysema ([KI]-[KI]).

## 2020-09-16 MED ORDER — ENOXAPARIN SODIUM 300 MG/3ML IJ SOLN
20.0000 mg | INTRAMUSCULAR | Status: DC
  Administered 2020-09-16: 20 mg via SUBCUTANEOUS
  Filled 2020-09-16 (×3): qty 0.2

## 2020-09-16 MED ORDER — HYDROCODONE-ACETAMINOPHEN 5-325 MG PO TABS
1.0000 | ORAL_TABLET | Freq: Four times a day (QID) | ORAL | Status: DC | PRN
Start: 2020-09-16 — End: 2020-09-29
  Administered 2020-09-18 – 2020-09-27 (×3): 1 via ORAL
  Filled 2020-09-16 (×4): qty 1

## 2020-09-16 MED ORDER — IOHEXOL 350 MG/ML SOLN
50.0000 mL | Freq: Once | INTRAVENOUS | Status: AC | PRN
  Administered 2020-09-16: 50 mL via INTRAVENOUS

## 2020-09-16 MED ORDER — ENSURE ENLIVE PO LIQD
237.0000 mL | Freq: Two times a day (BID) | ORAL | Status: DC
  Administered 2020-09-16 – 2020-09-17 (×3): 237 mL via ORAL
  Filled 2020-09-16: qty 237

## 2020-09-16 MED ORDER — STROKE: EARLY STAGES OF RECOVERY BOOK
Freq: Once | Status: AC
  Filled 2020-09-16: qty 1

## 2020-09-16 MED ORDER — ATORVASTATIN CALCIUM 40 MG PO TABS
40.0000 mg | ORAL_TABLET | Freq: Every day | ORAL | Status: DC
  Administered 2020-09-17 – 2020-09-29 (×13): 40 mg via ORAL
  Filled 2020-09-16 (×2): qty 1
  Filled 2020-09-16: qty 4
  Filled 2020-09-16 (×10): qty 1

## 2020-09-16 MED ORDER — POTASSIUM CHLORIDE CRYS ER 20 MEQ PO TBCR: 40.0000 meq | EXTENDED_RELEASE_TABLET | ORAL | Status: DC

## 2020-09-16 MED ORDER — POTASSIUM CHLORIDE CRYS ER 20 MEQ PO TBCR
40.0000 meq | EXTENDED_RELEASE_TABLET | ORAL | Status: AC
  Administered 2020-09-16: 40 meq via ORAL
  Filled 2020-09-16: qty 2

## 2020-09-16 MED ORDER — POTASSIUM CHLORIDE 10 MEQ/100ML IV SOLN
10.0000 meq | INTRAVENOUS | Status: AC
  Administered 2020-09-16 (×3): 10 meq via INTRAVENOUS
  Filled 2020-09-16 (×3): qty 100

## 2020-09-16 NOTE — Progress Notes (Signed)
  Echocardiogram 2D Echocardiogram has been performed.  Jennifer [name] F 09/16/2020, 4:03 PM

## 2020-09-16 NOTE — Plan of Care (Signed)

## 2020-09-16 NOTE — Evaluation (Signed)
Occupational Therapy Evaluation Patient Details Name: Jennifer [name] MRN: [id number] DOB: 11-02-1940 Today's Date: 09/16/2020    History of Present Illness Pt is a 79 y.o. F who presents with weakess, frequent falls, and confusion. CT head negative for acute abnormality. MRI pending. Significant PMH: HTN, osteoporosis, L1 compression fx s/p kyphoplasty.   Clinical Impression   Pt presents with decline in function and safety with ADLs and ADL mobility with impaired strength, balance, endurance and cognition. PTA pt lived at home alone and was Ind with ADLs/selfcare, IADLs  And used a RW for mobility. Pt currently limited by Poor activity tolerance, lethargy. Pt currently requires mod A with UB ADLs, max A with LB ADLs and mod A with mobility using RW. Pt would benefit from acute OT services to address impairments to maximize level of function and safety.    Follow Up Recommendations  SNF    Equipment Recommendations  Other (comment) (TBD at next venue of care)    Recommendations for Other Services       Precautions / Restrictions Precautions Precautions: Fall Restrictions Weight Bearing Restrictions: No      Mobility Bed Mobility Overal bed mobility: Needs Assistance Bed Mobility: Supine to Sit;Sit to Supine     Supine to sit: Mod assist Sit to supine: Mod assist   General bed mobility comments: mod A to manage LEs and elevate trunk    Transfers Overall transfer level: Needs assistance Equipment used: Rolling walker (2 wheeled)                  Balance                                           ADL either performed or assessed with clinical judgement   ADL Overall ADL's : Needs assistance/impaired Eating/Feeding: Sitting;Supervision/ safety;Set up   Grooming: Wash/dry hands;Wash/dry face;Minimal assistance;Sitting   Upper Body Bathing: Moderate assistance   Lower Body Bathing: Maximal assistance   Upper Body Dressing : Moderate  assistance   Lower Body Dressing: Maximal assistance   Toilet Transfer: Moderate assistance;RW;Stand-pivot;BSC;Cueing for safety;Cueing for sequencing   Toileting- Clothing Manipulation and Hygiene: Maximal assistance;Sit to/from stand       Functional mobility during ADLs: Moderate assistance;Rolling walker;Cueing for safety;Cueing for sequencing General ADL Comments: Pt very weak with Poor balance, impaired cognition and poor activity tolerance     Vision Baseline Vision/History: Wears glasses Wears Glasses: Reading only Patient Visual Report: No change from baseline       Perception     Praxis      Pertinent Vitals/Pain Pain Assessment: No/denies pain     Hand Dominance Right   Extremity/Trunk Assessment Upper Extremity Assessment Upper Extremity Assessment: Generalized weakness   Lower Extremity Assessment Lower Extremity Assessment: Defer to PT evaluation   Cervical / Trunk Assessment Cervical / Trunk Assessment: Kyphotic   Communication Communication Communication: No difficulties   Cognition Arousal/Alertness: Lethargic Behavior During Therapy: Flat affect Overall Cognitive Status: Impaired/Different from baseline Area of Impairment: Orientation;Memory;Safety/judgement;Awareness                 Orientation Level: Disoriented to;Time;Situation   Memory: Decreased short-term memory   Safety/Judgement: Decreased awareness of safety;Decreased awareness of deficits         General Comments       Exercises     Shoulder Instructions      Home  Living Family/patient expects to be discharged to:: Private residence Living Arrangements: Alone Available Help at Discharge: Family;Available PRN/intermittently Type of Home: House Home Access: Stairs to enter [employer] of Steps: 3 Entrance Stairs-Rails: Right;Left Home Layout: One level     Bathroom Shower/Tub: Tub/shower unit         Home Equipment: Emergency planning/management officer - 2  wheels          Prior Functioning/Environment Level of Independence: Needs assistance  Gait / Transfers Assistance Needed: uses RW, requires assist past few days, frequent falls per pt report ADL's / Homemaking Assistance Needed: difficulty performing ADL's last 4 days, prior was Ind with ADLs/selfcare and IADLs per pt report            OT Problem List: Decreased strength;Impaired balance (sitting and/or standing);Decreased cognition;Decreased safety awareness;Decreased activity tolerance;Decreased coordination;Decreased knowledge of use of DME or AE      OT Treatment/Interventions: Self-care/ADL training;DME and/or AE instruction;Therapeutic activities;Balance training;Therapeutic exercise;Patient/family education    OT Goals(Current goals can be found in the care plan section) Acute Rehab OT Goals Patient Stated Goal: pt would like to get stronger OT Goal Formulation: With patient Time For Goal Achievement: 09/30/20 Potential to Achieve Goals: Good ADL Goals Pt Will Perform Eating: with set-up;with supervision;sitting Pt Will Perform Grooming: with supervision;with set-up;sitting Pt Will Perform Upper Body Bathing: with min assist;with min guard assist;sitting Pt Will Perform Upper Body Dressing: with min assist;with min guard assist;sitting Pt Will Transfer to Toilet: with min assist;with min guard assist;stand pivot transfer;ambulating;bedside commode;regular height toilet Pt Will Perform Toileting - Clothing Manipulation and hygiene: with mod assist;with min assist;sit to/from stand  OT Frequency: Min 2X/week   Barriers to D/C:            Co-evaluation              AM-PAC OT "6 Clicks" Daily Activity     Outcome Measure Help from another person eating meals?: A Little Help from another person taking care of personal grooming?: A Little Help from another person toileting, which includes using toliet, bedpan, or urinal?: A Lot Help from another person bathing  (including washing, rinsing, drying)?: A Lot Help from another person to put on and taking off regular upper body clothing?: A Lot Help from another person to put on and taking off regular lower body clothing?: A Lot 6 Click Score: 14   End of Session Equipment Utilized During Treatment: Gait belt;Rolling walker;Other (comment) [facility]) Nurse Communication: Mobility status  Activity Tolerance: Patient limited by fatigue Patient left: in bed;with call bell/phone within reach;with bed alarm set  OT Visit Diagnosis: Other abnormalities of gait and mobility (R26.89);Unsteadiness on feet (R26.81);History of falling (Z91.81);Muscle weakness (generalized) (M62.81);Other symptoms and signs involving cognitive function                Time: 2426-8341 OT Time Calculation (min): 19 min Charges:  OT General Charges $OT Visit: 1 Visit OT Evaluation $OT Eval Moderate Complexity: 1 Mod    Galen Manila 09/16/2020, 2:00 PM

## 2020-09-16 NOTE — Consult Note (Addendum)
Neurology Consultation Reason for Consult: stroke Requesting Physician: Jennifer [name]  CC: confusion, disorientation  History is obtained from: chart  HPI: Jennifer [name] is a 80 y.o. female, an at least 1ppd smoker (62 pack year history) with prior history of left lobe mass (untreated), PVD, htn, HLD persenting with weakness and numerous falls occurring for the past several weeks, associated with dizziness, as well as poor appetite/anorexia. She was seen by her PCP on 5/17 for same and CT chest and MRI brain were ordered to be done as outpatient.  On 5/24 patient's family reported that she had more falls, and that she was declining rapidly; shes had an almost 10lb weight loss in the past month. She called PCP and was advised to come to ED for further evaluation.  She was dehydrated in ED, was given IVF.   Chest CT done yesterday showed new 1cm mass at lateral left upper lobe, not previously seen on prior imaging. Previously seen right upper lobe density is not seen on the new chest CT.    Brain MRI done showed a showering of predominantly subcentimeter foci of restricted diffusion in both cerebral and cerebellar hemispheres without hemorrhage. MRI also revealed remote lacunar infarcts in bilateral thalami and pons, as well as evidence of chronic poorly controlled hypertension.  Stroke neurology was consulted regarding the above, and we thank the primary team for their kind referral.       LKW: at least two weeks  tPA given?: No   Premorbid modified rankin scale: 4     0 - No symptoms.     1 - No significant disability. Able to carry out all usual activities, despite some symptoms.     2 - Slight disability. Able to look after own affairs without assistance, but unable to carry out all previous activities.     3 - Moderate disability. Requires some help, but able to walk unassisted.     4 - Moderately severe disability. Unable to attend to own bodily needs without assistance, and unable to walk  unassisted.     5 - Severe disability. Requires constant nursing care and attention, bedridden, incontinent.     6 - Dead.     ROS:   Unable to obtain due to altered mental status.   Past Medical History:  Diagnosis Date  . ANEMIA-NOS   . Cataract    right cataract removed  . Clotting disorder (HCC)    Pletal  . Depression    no longer taking meds/ resolved  . DYSLIPIDEMIA   . GERD (gastroesophageal reflux disease)   . Hemorrhoids   . HYPERTENSION   . Negative colorectal cancer screening using Cologuard test   . Osteoporosis, senile    DEXA 03/2014: -3.3 R fem  . Peripheral vascular disease, unspecified (HCC) dx 02/2010 ABI  . SMOKER    62 pack year smoking history   Family History  Problem Relation Age of Onset  . Breast cancer Mother   . Lung cancer Father   . Hypertension Brother   . Colon cancer Neg Hx   . Esophageal cancer Neg Hx   . Stomach cancer Neg Hx   . Rectal cancer Neg Hx       Social History:  reports that she has been smoking cigarettes. She has a 31.00 pack-year smoking history. She has never used smokeless tobacco. She reports current alcohol use. She reports that she does not use drugs.    Exam: Current vital signs: BP (!) 151/78 (BP  Location: Right Arm)   Pulse 72   Temp 97.6 F (36.4 C) (Axillary)   Resp 16   Wt 37.6 kg   SpO2 100%   BMI 14.23 kg/m  Vital signs in last 24 hours: Temp:  [97.5 F (36.4 C)-98.4 F (36.9 C)] 97.6 F (36.4 C) (05/26 0949) Pulse Rate:  [53-72] 72 (05/26 0949) Resp:  [14-28] 16 (05/26 0949) BP: (132-151)/(57-80) 151/78 (05/26 0949) SpO2:  [97 %-100 %] 100 % (05/26 0949) Weight:  [37.6 kg] 37.6 kg (05/26 0017)   Physical Exam  Constitutional: Appears well-developed and well-nourished.  Psych: Affect appropriate to situation,   Eyes: No scleral injection HENT: No oropharyngeal obstruction.  MSK: no joint deformities.  Cardiovascular: Normal rate and regular rhythm.  Respiratory: Effort normal,  non-labored breathing GI: Soft.  No distension. There is no tenderness.  Skin: Warm dry and intact visible skin  Neuro: Mental Status: pt found lying left lateral recumbent in bed, in near contracted state. Strong smell of urine. Eyes remain closed throughout evaluation except when patient is told that she's had multiple strokes, when eyes open wide and she becomes more responsive. My exam is limited by patient's cooperation.   Patient is awake, alert, oriented to person and place, but not to month, year, and situation.  Patient is unable to give a clear and coherent history.  No signs of aphasia  She extinguishes to double simultaneous stimulation, appears to have some left neglect She gives the president as Jennifer [name]  Cranial Nerves: II: She has a left hemianopia.  Pupils are equal, round, and reactive to light.    III,IV, VI: EOMI without ptosis or diploplia.  V: Facial sensation is symmetric to temperature VII: Facial movement with mild flattening of left nasolabial fold VIII: hearing is intact to voice X: Unable to assess  XI: Shoulder shrug is symmetric. XII: tongue is midline without atrophy or fasciculations.   Motor: She does not cooperate with formal strength testing but she does move all extremities spontaneously.    Sensory: Sensation is symmetric to light touch and temperature in the arms and legs   Deep Tendon Reflexes: 2+ and symmetric in the biceps and patellae.     Cerebellar: Unable to assess   NIHSS total 3 Score breakdown: level of consciousness  1          Month and age               2     I have reviewed labs in epic and the results pertinent to this consultation are:  Results for Jennifer, [name] (MRN [id number]) as of 09/16/2020 11:02  Ref. Range 09/16/2020 03:33  Sodium Latest Ref Range: 135 - 145 mmol/L 138  Potassium Latest Ref Range: 3.5 - 5.1 mmol/L 2.7 (LL)  Chloride Latest Ref Range: 98 - 111 mmol/L 107  CO2 Latest Ref Range: 22 - 32  mmol/L 22  Glucose Latest Ref Range: 70 - 99 mg/dL 93  BUN Latest Ref Range: 8 - 23 mg/dL 26 (H)  Creatinine Latest Ref Range: 0.44 - 1.00 mg/dL 9.03 (H)  Calcium Latest Ref Range: 8.9 - 10.3 mg/dL 8.2 (L)  Anion gap Latest Ref Range: 5 - 15  9   Results for Jennifer, [name] (MRN [id number]) as of 09/16/2020 11:02  Ref. Range 09/15/2020 11:10  TSH Latest Ref Range: 0.350 - 4.500 uIU/mL 3.272   I have reviewed the images obtained:  MRI brain: 09/15/2020  1. Multiple scattered predominantly subcentimeter foci  of restricted diffusion involving the cortical and subcortical aspects of both cerebral and cerebellar hemispheres, consistent with multifocal ischemic infarcts. No associated hemorrhage or significant mass effect.  2. Underlying moderately advanced cerebral atrophy with chronic microvascular ischemic disease, with multiple additional remote lacunar infarcts about the bilateral thalami and pons.  3. Multiple chronic micro hemorrhages clustered about the cerebellum, brainstem, and thalami, suggesting chronic poorly controlled hypertension.  CT CHEST 09/15/2020  Emphysema and pulmonary scarring. No sign of pneumonia, collapse or effusion.   Newly seen 1 cm mass in the lateral left upper lobe, axial images 61 and 62. This was not present previously and is quite concerning for pulmonary malignancy.    Previously seen density in the right upper lobe is no longer measurable and looks like a simple scar.  Partial visualization of a large abdominal aortic aneurysm as seen previously, not completely evaluated today. Only the upper portion is visualized.  Large hiatal hernia.   Impression: 80 year old white female with history outlined above who presents with confusion, disorientation and global weakness. Neuroimaging shows embolic shower in both cerebral and cerebellar hemispheres.   Recommendations: - STROKE WORKUP in setting of new lung mass in active smoker.  Hgba1c, fasting  lipid panel  Pt/OT/ST consults  Echocardiogram  Carotid dopplers  Aspirin 81mg  daily.  Risk factor modification: patient is an active smoker.  Telemetry monitoring  Frequent neuro checks  IVF  High dose statin therapy.    Have seen the patient reviewed the above notes.  She appears to have had an embolic shower and needs work-up for such.  She does not like to open her eyes, which I suspect is an eyelid apraxia which goes along with neglect. She will need an embolic workup as above.   If her lung mass proves cancerous, hypercoagulability is certainly a possibility.   Pleas Koch, MD [facility] [phone]  If 7pm- 7am, please page neurology on call as listed in AMION.

## 2020-09-16 NOTE — Consult Note (Signed)
Reason for Consult: Depression with passive SI   HPI:  Jennifer [name] is an 80 yr old female who presents with generalized weakness, intermittent nausea and vomiting, poor appetite and unintentional weight loss for about 2 weeks, and admitted for FTT, depression with passive suicidally and AKI. MRI Brain showed multifocal infarcts including cortical and subcortical areas of both cerebral and cerebellar hemispheres.   Patient was laying in bed with her sister present in the room. Patient's sister reports that patient was started on Lexapro last Tuesday 5/17. That the patient has not been eating for the last few weeks and that she has not wanted to eat. Sister reports that the family is not currently sure what they would like to do going forward as the patient has not been able to keep down her medications the last day or so.  The patient reports no SI, HI, or AVH.   Plan: Discussed case with Dr. Lucianne Muss  Discussed with Hospitalist that Palliative Care has been consulted and will wait to see what family decides. At this point cannot consider her failing Lexapro as she has only been on it for 8 days but will stop it as it could be contributing to her nausea/vomiting.  -Stop Lexapro -Continue Seroquel 25 mg QHS   At this point Psych will sign off. If any further questions arise please reconsult.  Arna Snipe MD Resident

## 2020-09-16 NOTE — Progress Notes (Signed)
PROGRESS NOTE  Jennifer [name] PJA:[id number] DOB: [birth year]   PCP: Pincus Sanes, MD  Patient is from: Home.  Lives alone.  DOA: 09/15/2020 LOS: 0  Chief complaints: Generalized weakness  Brief Narrative / Interim history: 80 year old F with PMH of PVD, HTN, HLD and depression presenting with generalized weakness, intermittent nausea and vomiting, poor appetite and unintentional weight loss for about 2 weeks, and admitted for FTT, depression with passive suicidally and AKI.    MRI brain showed multifocal infarcts including cortical and subcortical areas of both cerebral and cerebellar hemispheres.  Patient is already outside tPA window.  Neurology consulted the next morning.  Subjective: Seen and examined earlier this morning.  She feels "terrible".   She admits to depressed mood and suicidal ideation without active plan. She says she is "tired of being tired".  She denies headache, vision changes, focal weakness, numbness or tingling.  She denies chest pain, shortness of breath or dizziness.  Nausea improved.  No further emesis.  She denies abdominal pain or UTI symptoms.  Objective: Vitals:   09/15/20 2234 09/16/20 0017 09/16/20 0523 09/16/20 0949  BP: (!) 143/73 (!) 147/60 (!) 147/80 (!) 151/78  Pulse: (!) 59 (!) 58 68 72  Resp: 16 15 14 16   Temp: 97.9 F (36.6 C) 98.4 F (36.9 C) (!) 97.5 F (36.4 C) 97.6 F (36.4 C)  TempSrc: Oral Oral  Axillary  SpO2: 99% 98% 100% 100%  Weight:  37.6 kg      Intake/Output Summary (Last 24 hours) at 09/16/2020 1116 Last data filed at 09/16/2020 0900 Gross per 24 hour  Intake 379.25 ml  Output 200 ml  Net 179.25 ml   Filed Weights   09/16/20 0017  Weight: 37.6 kg    Examination:  GENERAL: Frail looking elderly female. HEENT: MMM.  Vision and hearing grossly intact.  NECK: Supple.  No apparent JVD.  RESP: On RA.  No IWOB.  Fair aeration bilaterally. CVS:  RRR. Heart sounds normal.  ABD/GI/GU: BS+. Abd soft, NTND.  MSK/EXT:   Moves extremities.  Significant muscle mass and subcu fat loss. SKIN: no apparent skin lesion or wound NEURO: Awake and oriented appropriately.  Speech clear. Cranial nerves II-XII intact. Motor 5/5 in all muscle groups of UE and LE bilaterally, Normal tone. Light sensation intact in all dermatomes of upper and lower ext bilaterally. Patellar reflex symmetric. PSYCH: Flat affect.  Admits to suicidal ideation.   Procedures:  None  Microbiology summarized: COVID-19 and influenza PCR nonreactive.  Assessment & Plan: Multifocal bilateral cerebral and cerebellar stroke-noted on MRI brain.  No focal neurodeficit but generalized weakness.  Pattern suggestive for embolic CVA.  She is already outside tPA window. -Neurology consulted.  Will defer further work-up to neurology  Failure to thrive in adult-presents with poor appetite, unintentional weight loss, depressed mood, passive suicidal ideation and intermittent nausea and vomiting.  She has significant muscle mass and subcu fat loss on exam.  Also some concern about cognitive decline.  MRI brain with multifocal stroke which might be contributing.  TSH within normal. -Agree with RD/PT/OT evaluation -Check CK -Palliative care consulted.  Major depression with passive suicidal ideation -Continue home Lexapro and Ativan -Psychiatry consulted-follow recommendations  AKI/azotemia: Likely prerenal in the setting of poor p.o. intake.  Improved. Recent Labs    12/09/19 1433 07/05/20 1436 09/07/20 1504 09/15/20 1109 09/16/20 0333  BUN 22 14 13  33* 26*  CREATININE 0.89 0.97 1.01 1.47* 1.12*  -Continue IV fluid -Continue monitoring  Hypokalemia:  K 2.7. -K-Dur 40 mill equivalent x3 -Check magnesium  Essential hypertension: BP slightly elevated. -Hold amlodipine and as needed hydralazine for permissive hypertension  HLD -Continue Lipitor  PVD -Continue ASA, Pletal  Tobacco use disorder -Encourage cessation -Nicotine patch  Severe  malnutrition/FTT-decreased appetite with unintentional weight loss Body mass index is 14.23 kg/m.  -See above.       DVT prophylaxis:  On subcu Lovenox.  Code Status: DNR/DNI Family Communication: Updated patient brother Level of care: Telemetry Medical Status is: Observation  The patient will require care spanning > 2 midnights and should be moved to inpatient because: Persistent severe electrolyte disturbances, Ongoing diagnostic testing needed not appropriate for outpatient work up, IV treatments appropriate due to intensity of illness or inability to take PO and Inpatient level of care appropriate due to severity of illness  Dispo: The patient is from: Home              Anticipated d/c is to: SNF              Patient currently is not medically stable to d/c.   Difficult to place patient No       Consultants:  Neurology Palliative medicine Psychiatry   Sch Meds:  Scheduled Meds: . amLODipine  5 mg Oral Daily  . aspirin EC  81 mg Oral QAC breakfast  . atorvastatin  20 mg Oral Daily  . cilostazol  50 mg Oral BID  . docusate sodium  100 mg Oral BID  . enoxaparin (LOVENOX) injection  20 mg Subcutaneous Q24H  . escitalopram  5 mg Oral Daily  . feeding supplement  237 mL Oral BID BM  . LORazepam  0.5 mg Intravenous Once  . pantoprazole  40 mg Oral Daily  . potassium chloride  10 mEq Oral BID  . QUEtiapine  25 mg Oral QHS  . sodium chloride flush  3 mL Intravenous Q12H   Continuous Infusions: . lactated ringers 75 mL/hr at 09/16/20 0315  . potassium chloride 10 mEq (09/16/20 1039)   PRN Meds:.acetaminophen **OR** acetaminophen, albuterol, ALPRAZolam, bisacodyl, hydrALAZINE, HYDROcodone-acetaminophen, morphine injection, nicotine, ondansetron **OR** ondansetron (ZOFRAN) IV, polyethylene glycol  Antimicrobials: Anti-infectives (From admission, onward)   None       I have personally reviewed the following labs and images: CBC: Recent Labs  Lab 09/15/20 1057  09/16/20 0333  WBC 9.6 7.4  HGB 11.9* 9.0*  HCT 37.5 27.0*  MCV 81.0 78.5*  PLT 98* 72*   BMP &GFR Recent Labs  Lab 09/15/20 1109 09/16/20 0333  NA 135 138  K 3.1* 2.7*  CL 101 107  CO2 23 22  GLUCOSE 101* 93  BUN 33* 26*  CREATININE 1.47* 1.12*  CALCIUM 9.2 8.2*  MG 2.1  --    Estimated Creatinine Clearance: 23 mL/min (A) (by C-G formula based on SCr of 1.12 mg/dL (H)). Liver & Pancreas: Recent Labs  Lab 09/15/20 1109  AST 24  ALT 8  ALKPHOS 71  BILITOT 0.9  PROT 7.7  ALBUMIN 3.4*   No results for input(s): LIPASE, AMYLASE in the last 168 hours. No results for input(s): AMMONIA in the last 168 hours. Diabetic: No results for input(s): HGBA1C in the last 72 hours. Recent Labs  Lab 09/15/20 1617  GLUCAP 95   Cardiac Enzymes: No results for input(s): CKTOTAL, CKMB, CKMBINDEX, TROPONINI in the last 168 hours. No results for input(s): PROBNP in the last 8760 hours. Coagulation Profile: No results for input(s): INR, PROTIME in the last 168  hours. Thyroid Function Tests: Recent Labs    09/15/20 1110  TSH 3.272   Lipid Profile: No results for input(s): CHOL, HDL, LDLCALC, TRIG, CHOLHDL, LDLDIRECT in the last 72 hours. Anemia Panel: No results for input(s): VITAMINB12, FOLATE, FERRITIN, TIBC, IRON, RETICCTPCT in the last 72 hours. Urine analysis:    Component Value Date/Time   COLORURINE YELLOW 07/06/2019 0708   APPEARANCEUR CLEAR 07/06/2019 0708   LABSPEC 1.013 07/06/2019 0708   PHURINE 5.0 07/06/2019 0708   GLUCOSEU NEGATIVE 07/06/2019 0708   GLUCOSEU NEGATIVE 04/21/2015 1135   HGBUR MODERATE (A) 07/06/2019 0708   BILIRUBINUR NEGATIVE 07/06/2019 0708   KETONESUR NEGATIVE 07/06/2019 0708   PROTEINUR NEGATIVE 07/06/2019 0708   UROBILINOGEN 0.2 04/21/2015 1135   NITRITE NEGATIVE 07/06/2019 0708   LEUKOCYTESUR NEGATIVE 07/06/2019 0708   Sepsis Labs: Invalid input(s): PROCALCITONIN, LACTICIDVEN  Microbiology: Recent Results (from the past 240  hour(s))  Resp Panel by RT-PCR (Flu A&B, Covid) Nasopharyngeal Swab     Status: None   Collection Time: 09/15/20  1:07 PM   Specimen: Nasopharyngeal Swab; Nasopharyngeal(NP) swabs in vial transport medium  Result Value Ref Range Status   SARS Coronavirus 2 by RT PCR NEGATIVE NEGATIVE Final    Comment: (NOTE) SARS-CoV-2 target nucleic acids are NOT DETECTED.  The SARS-CoV-2 RNA is generally detectable in upper respiratory specimens during the acute phase of infection. The lowest concentration of SARS-CoV-2 viral copies this assay can detect is 138 copies/mL. A negative result does not preclude SARS-Cov-2 infection and should not be used as the sole basis for treatment or other patient management decisions. A negative result may occur with  improper specimen collection/handling, submission of specimen other than nasopharyngeal swab, presence of viral mutation(s) within the areas targeted by this assay, and inadequate number of viral copies(<138 copies/mL). A negative result must be combined with clinical observations, patient history, and epidemiological information. The expected result is Negative.  Fact Sheet for Patients:  BloggerCourse.com  Fact Sheet for Healthcare Providers:  SeriousBroker.it  This test is no t yet approved or cleared by the [place] FDA and  has been authorized for detection and/or diagnosis of SARS-CoV-2 by FDA under an Emergency Use Authorization (EUA). This EUA will remain  in effect (meaning this test can be used) for the duration of the COVID-19 declaration under Section 564(b)(1) of the Act, 21 U.S.C.section 360bbb-3(b)(1), unless the authorization is terminated  or revoked sooner.       Influenza A by PCR NEGATIVE NEGATIVE Final   Influenza B by PCR NEGATIVE NEGATIVE Final    Comment: (NOTE) The Xpert Xpress SARS-CoV-2/FLU/RSV plus assay is intended as an aid in the diagnosis of influenza from  Nasopharyngeal swab specimens and should not be used as a sole basis for treatment. Nasal washings and aspirates are unacceptable for Xpert Xpress SARS-CoV-2/FLU/RSV testing.  Fact Sheet for Patients: BloggerCourse.com  Fact Sheet for Healthcare Providers: SeriousBroker.it  This test is not yet approved or cleared by the [place] FDA and has been authorized for detection and/or diagnosis of SARS-CoV-2 by FDA under an Emergency Use Authorization (EUA). This EUA will remain in effect (meaning this test can be used) for the duration of the COVID-19 declaration under Section 564(b)(1) of the Act, 21 U.S.C. section 360bbb-3(b)(1), unless the authorization is terminated or revoked.  Performed at [facility], [address] [place], [place] [address]     Radiology Studies: DG Chest 2 View  Result Date: 09/15/2020 CLINICAL DATA:  Weakness and shortness  of breath. EXAM: CHEST - 2 VIEW COMPARISON:  06/03/2019 FINDINGS: Again noted is kyphosis in the thoracic spine. Both lungs are clear. Heart size is stable. Evidence for hiatal hernia. In the interim, there has been bone cement placed in the L1 vertebral body. Difficult to exclude a new compression deformity at L2. Diffuse atherosclerotic calcifications in the thoracic aorta. IMPRESSION: 1. No acute cardiopulmonary disease. 2. Previous kyphoplasty at L1. Cannot exclude a new compression deformity at L2. Recommend dedicated lumbar spine images if this is an area of clinical concern. Electronically Signed   By: Richarda Overlie M.D.   On: 09/15/2020 11:55   CT Head Wo Contrast  Result Date: 09/15/2020 CLINICAL DATA:  Delirium EXAM: CT HEAD WITHOUT CONTRAST TECHNIQUE: Contiguous axial images were obtained from the base of the skull through the vertex without intravenous contrast. COMPARISON:  None. FINDINGS: Brain: No evidence of acute infarction, hemorrhage, hydrocephalus, extra-axial  collection or mass lesion/mass effect. Diffuse cortical atrophy. Periventricular white matter hypodensity is a nonspecific finding, but most commonly relates to chronic ischemic small vessel disease. Vascular: No hyperdense vessel or unexpected calcification. Skull: Smoothly marginated protuberant mass extending laterally from the left side of the calvarium measuring 2.4 x 1.5 cm is likely an osteoma. Legs aggressive characteristics such as periosteal reaction or cortical breakthrough. Sinuses/Orbits: Partial opacification of left anterior ethmoid air cells. Complete opacification left maxillary sinus. Hyperdense content of the left maxillary sinus may be due to inspissated mucus, hemorrhage, or fungal infection. Other: None. IMPRESSION: No acute intracranial abnormality Electronically Signed   By: Acquanetta Belling M.D.   On: 09/15/2020 13:17   CT Chest Wo Contrast  Result Date: 09/15/2020 CLINICAL DATA:  Persistent cough.  Weakness and shortness of breath. EXAM: CT CHEST WITHOUT CONTRAST TECHNIQUE: Multidetector CT imaging of the chest was performed following the standard protocol without IV contrast. COMPARISON:  Chest radiography same day.  CT angiography 06/03/2019. FINDINGS: Cardiovascular: Cardiomegaly. No pericardial fluid. Extensive coronary artery calcification and/or stents. Extensive aortic atherosclerotic calcification. Maximal diameter of the ascending aorta 4.4 cm, increased by 1 mm from the previous exam. Mediastinum/Nodes: No hilar or mediastinal mass or lymphadenopathy. Large hiatal hernia. Lungs/Pleura: Emphysema and pulmonary scarring as seen previously. Previous 6 mm density in the right upper lobe question previously looks more like a simple scar today. This is not a measurable nodule on today's exam. This is located in the region of image 46. Scarring at both lung apices. No evidence of focal nodule or mass in the right lung. However, in the lateral aspect of the left upper lobe axial image 62,  the patient has developed a 1 cm peripheral mass/nodule where there was only a small area of scarring previously. This is viewed with considerable concern given the rate of growth. No pleural effusion on either side. Upper Abdomen: Hiatal hernia as noted previously. Partial visualization of the abdominal aortic aneurysm measuring up to 6.6 cm in the portion imaged. This was not fully or completely evaluated. Musculoskeletal: Chronic augmented fracture in the upper lumbar region. Old healed sternal fracture. No acute bone finding. IMPRESSION: Emphysema and pulmonary scarring. No sign of pneumonia, collapse or effusion. Newly seen 1 cm mass in the lateral left upper lobe, axial images 61 and 62. This was not present previously and is quite concerning for pulmonary malignancy. Consider one of the following in 3 months for both low-risk and high-risk individuals: (a) repeat chest CT, (b) follow-up PET-CT, or (c) tissue sampling. This recommendation follows the consensus statement: Guidelines  for Management of Incidental Pulmonary Nodules Detected on CT Images: From the Fleischner Society 2017; Radiology 2017; [phone]. Previously seen density in the right upper lobe is no longer measurable and looks like a simple scar. Cardiomegaly, coronary artery calcification it advanced atherosclerosis of the aorta. Maximal diameter of the ascending aorta increased from 4.3 cm to 4.4 cm. Recommend annual imaging followup by CTA or MRA. This recommendation follows 2010 ACCF/AHA/AATS/ACR/ASA/SCA/SCAI/SIR/STS/SVM Guidelines for the Diagnosis and Management of Patients with Thoracic Aortic Disease. Circulation. 2010; 121: W295-A213. Aortic aneurysm NOS (ICD10-I71.9) Partial visualization of a large abdominal aortic aneurysm as seen previously, not completely evaluated today. Only the upper portion is visualized. Large hiatal hernia. Aortic Atherosclerosis (ICD10-I70.0) and Emphysema (ICD10-J43.9). Electronically Signed   By: Paulina Fusi M.D.   On: 09/15/2020 14:35   MR BRAIN WO CONTRAST  Result Date: 09/15/2020 CLINICAL DATA:  Initial evaluation for neuro deficit, stroke suspected. EXAM: MRI HEAD WITHOUT CONTRAST TECHNIQUE: Multiplanar, multiecho pulse sequences of the brain and surrounding structures were obtained without intravenous contrast. COMPARISON:  Prior CT from earlier the same day. FINDINGS: Brain: Diffuse prominence of the CSF containing spaces compatible with generalized cerebral atrophy, moderately advanced in nature. Patchy and confluent T2/FLAIR hyperintensity involving the periventricular and deep white matter both cerebral hemispheres as well as the pons, most consistent with chronic small vessel ischemic disease, also moderately advanced. Few scattered superimposed remote lacunar infarcts present about the thalami and pons. Multiple scattered predominantly subcentimeter foci of restricted diffusion seen involving the cortical and subcortical aspects of both cerebral and cerebellar hemispheres, consistent with multifocal ischemic infarcts. Scattered patchy involvement of the deep gray nuclei and brainstem as well. For reference purposes, the most prominent discrete focus seen involving the right periatrial white matter and measures 7 mm. No associated hemorrhage or significant mass effect. Multiple scattered underlying chronic micro hemorrhages noted, predominantly clustered about the cerebellum, brainstem, and thalami, likely related to poorly controlled hypertension. No mass lesion, midline shift or mass effect. No hydrocephalus or extra-axial fluid collection. Pituitary gland suprasellar region within normal limits. Midline structures intact. Vascular: Major intracranial vascular flow voids are maintained. Skull and upper cervical spine: Craniocervical junction within normal limits. Bone marrow signal intensity normal. No scalp soft tissue abnormality. Sinuses/Orbits: Patient status post ocular lens replacement on  the right. Globes and orbital soft tissues demonstrate no acute finding. Chronic left ethmoidal and maxillary sinusitis noted. Trace bilateral mastoid effusions, of doubtful significance. Inner ear structures grossly normal. Other: None. IMPRESSION: 1. Multiple scattered predominantly subcentimeter foci of restricted diffusion involving the cortical and subcortical aspects of both cerebral and cerebellar hemispheres, consistent with multifocal ischemic infarcts. No associated hemorrhage or significant mass effect. 2. Underlying moderately advanced cerebral atrophy with chronic microvascular ischemic disease, with multiple additional remote lacunar infarcts about the bilateral thalami and pons. 3. Multiple chronic micro hemorrhages clustered about the cerebellum, brainstem, and thalami, suggesting chronic poorly controlled hypertension. Electronically Signed   By: Rise Mu M.D.   On: 09/15/2020 19:58      Srikar Chiang T. Tiffay Pinette [facility]  If 7PM-7AM, please contact night-coverage [email or web address] 09/16/2020, 11:16 AM

## 2020-09-16 NOTE — Progress Notes (Signed)
SLP Cancellation Note  Patient Details Name: Jennifer [name] MRN: [id number] DOB: 1940-06-22   Cancelled treatment:       Reason Eval/Treat Not Completed: Patient at procedure or test/unavailable- ECHO Will continue efforts.     Blenda Mounts [name] 09/16/2020, 3:54 PM

## 2020-09-17 ENCOUNTER — Inpatient Hospital Stay (HOSPITAL_COMMUNITY): Payer: Medicare Other

## 2020-09-17 ENCOUNTER — Inpatient Hospital Stay: Admission: RE | Admit: 2020-09-17 | Payer: Medicare Other | Source: Ambulatory Visit

## 2020-09-17 DIAGNOSIS — Z7189 Other specified counseling: Secondary | ICD-10-CM | POA: Diagnosis not present

## 2020-09-17 DIAGNOSIS — D696 Thrombocytopenia, unspecified: Secondary | ICD-10-CM

## 2020-09-17 DIAGNOSIS — Z720 Tobacco use: Secondary | ICD-10-CM | POA: Diagnosis not present

## 2020-09-17 DIAGNOSIS — I739 Peripheral vascular disease, unspecified: Secondary | ICD-10-CM

## 2020-09-17 DIAGNOSIS — R918 Other nonspecific abnormal finding of lung field: Secondary | ICD-10-CM | POA: Diagnosis not present

## 2020-09-17 DIAGNOSIS — R627 Adult failure to thrive: Secondary | ICD-10-CM | POA: Diagnosis not present

## 2020-09-17 DIAGNOSIS — E876 Hypokalemia: Secondary | ICD-10-CM | POA: Diagnosis not present

## 2020-09-17 DIAGNOSIS — Z515 Encounter for palliative care: Secondary | ICD-10-CM | POA: Diagnosis not present

## 2020-09-17 DIAGNOSIS — I639 Cerebral infarction, unspecified: Secondary | ICD-10-CM | POA: Diagnosis not present

## 2020-09-17 LAB — CBC
HCT: 27.6 % — ABNORMAL LOW (ref 36.0–46.0)
Hemoglobin: 9 g/dL — ABNORMAL LOW (ref 12.0–15.0)
MCH: 25.6 pg — ABNORMAL LOW (ref 26.0–34.0)
MCHC: 32.6 g/dL (ref 30.0–36.0)
MCV: 78.6 fL — ABNORMAL LOW (ref 80.0–100.0)
Platelets: 89 10*3/uL — ABNORMAL LOW (ref 150–400)
RBC: 3.51 MIL/uL — ABNORMAL LOW (ref 3.87–5.11)
RDW: 16.6 % — ABNORMAL HIGH (ref 11.5–15.5)
WBC: 8.7 10*3/uL (ref 4.0–10.5)
nRBC: 0 % (ref 0.0–0.2)

## 2020-09-17 LAB — MAGNESIUM: Magnesium: 1.6 mg/dL — ABNORMAL LOW (ref 1.7–2.4)

## 2020-09-17 LAB — GLUCOSE, CAPILLARY
Glucose-Capillary: 87 mg/dL (ref 70–99)
Glucose-Capillary: 91 mg/dL (ref 70–99)

## 2020-09-17 LAB — LIPID PANEL
Cholesterol: 142 mg/dL (ref 0–200)
HDL: 31 mg/dL — ABNORMAL LOW (ref >40)
LDL Cholesterol: 89 mg/dL (ref 0–99)
Total CHOL/HDL Ratio: 4.6 RATIO
Triglycerides: 112 mg/dL (ref <150)
VLDL: 22 mg/dL (ref 0–40)

## 2020-09-17 LAB — RENAL FUNCTION PANEL
Albumin: 2.7 g/dL — ABNORMAL LOW (ref 3.5–5.0)
Anion gap: 10 (ref 5–15)
BUN: 15 mg/dL (ref 8–23)
CO2: 22 mmol/L (ref 22–32)
Calcium: 8.1 mg/dL — ABNORMAL LOW (ref 8.9–10.3)
Chloride: 104 mmol/L (ref 98–111)
Creatinine, Ser: 0.99 mg/dL (ref 0.44–1.00)
GFR, Estimated: 57 mL/min — ABNORMAL LOW (ref >60)
Glucose, Bld: 93 mg/dL (ref 70–99)
Phosphorus: 2.4 mg/dL — ABNORMAL LOW (ref 2.5–4.6)
Potassium: 2.8 mmol/L — ABNORMAL LOW (ref 3.5–5.1)
Sodium: 136 mmol/L (ref 135–145)

## 2020-09-17 LAB — HEMOGLOBIN A1C
Hgb A1c MFr Bld: 5.7 % — ABNORMAL HIGH (ref 4.8–5.6)
Mean Plasma Glucose: 117 mg/dL

## 2020-09-17 LAB — CK: Total CK: 133 U/L (ref 38–234)

## 2020-09-17 MED ORDER — MAGNESIUM SULFATE 2 GM/50ML IV SOLN
2.0000 g | Freq: Once | INTRAVENOUS | Status: AC
  Administered 2020-09-17: 2 g via INTRAVENOUS
  Filled 2020-09-17: qty 50

## 2020-09-17 MED ORDER — PROSOURCE PLUS PO LIQD
30.0000 mL | Freq: Two times a day (BID) | ORAL | Status: DC
  Administered 2020-09-18 – 2020-09-29 (×22): 30 mL via ORAL
  Filled 2020-09-17 (×22): qty 30

## 2020-09-17 MED ORDER — POTASSIUM CHLORIDE CRYS ER 20 MEQ PO TBCR
40.0000 meq | EXTENDED_RELEASE_TABLET | ORAL | Status: AC
  Administered 2020-09-17 (×3): 40 meq via ORAL
  Filled 2020-09-17 (×3): qty 2

## 2020-09-17 MED ORDER — ADULT MULTIVITAMIN W/MINERALS CH
1.0000 | ORAL_TABLET | Freq: Every day | ORAL | Status: DC
  Administered 2020-09-18 – 2020-09-29 (×12): 1 via ORAL
  Filled 2020-09-17 (×12): qty 1

## 2020-09-17 MED ORDER — ENSURE ENLIVE PO LIQD
237.0000 mL | Freq: Three times a day (TID) | ORAL | Status: DC
  Administered 2020-09-17 – 2020-09-29 (×34): 237 mL via ORAL
  Filled 2020-09-17 (×3): qty 237

## 2020-09-17 MED ORDER — DRONABINOL 2.5 MG PO CAPS
2.5000 mg | ORAL_CAPSULE | Freq: Two times a day (BID) | ORAL | Status: DC
  Administered 2020-09-17 – 2020-09-29 (×24): 2.5 mg via ORAL
  Filled 2020-09-17 (×25): qty 1

## 2020-09-17 NOTE — Progress Notes (Addendum)
Initial Nutrition Assessment  DOCUMENTATION CODES:   Severe malnutrition in context of chronic illness  INTERVENTION:  Monitor magnesium, potassium, and phosphorus daily for at least 3 days, MD to replete as needed, as pt is at risk for refeeding syndrome given prolonged poor po intake and severe malnutrition   Calorie count, RD to follow-up with results on Monday, 5/30  Ensure Enlive po TID, each supplement provides 350 kcal and 20 grams of protein  Magic cup TID with meals, each supplement provides 290 kcal and 9 grams of protein  66m Prosource Plus po BID, each supplement provides 100 kcals and 15 grams of protein  MVI with minerals daily  Provided education on improving nutrition status after discharge and increasing kcal/protein intake   NUTRITION DIAGNOSIS:   Severe Malnutrition related to chronic illness (anorexia nervosa) as evidenced by energy intake < or equal to 75% for > or equal to 1 month,severe fat depletion,severe muscle depletion,percent weight loss.  GOAL:   Patient will meet greater than or equal to 90% of their needs  MONITOR:   PO intake,Supplement acceptance,Labs,Weight trends,I & O's  REASON FOR ASSESSMENT:   Consult Calorie Count,Assessment of nutrition requirement/status  ASSESSMENT:   Pt admitted with FTT, depression with passive SI, and AKI. Pt identified by MRI to have multifocal bilateral cerebral and cerebellar stroke(s). PMH includes PVD, HTN, HLD, and depression.   Pt reports generalized weakness, intermittent N/V, poor appetite, and unintentional weight loss for about 2 weeks PTA. Prior to that point, pt was independent with all ADLs. Palliative Medicine met with pt/family and pt shared with PMT that she has had a lifelong history of anorexia nervosa. Pt shared that she "never ate exceptionally well and has a lot of body dysmorphia." PMT discussed the importance of continued oral intake with the pt and the impact of prolonged inadequate  intake. Pt shared that she understands the importance of eating and drinking and that her life expectancy will be shortened if she fails to properly nourish her body. Note pt with 0% meal completion x 4 recorded meals since admission. Pt and PMT made plan to continue antidepressant medications, initiate appetite stimulate, and begin calorie count. RD will provide pt with oral nutrition supplements to aid in improving kcal/protein intake. RD will also provide pt with information on improving nutrition status after discharge and strategies to increase kcal/protein intake. Discussed pt with RN. Note pt has indicated that she does not want a feeding tube per MOST form. GOC discussion ongoing.   Per weight readings, pt weighed 41.6 kg on 03/03/20 and now weighs 36.9 kg. This indicates a clinically significant 11% weight loss x6 months.    UOP: [id number] hours Stool: 1x unmeasured stool occurrence today  Medications:  . [START ON 09/18/2020] (feeding supplement) PROSource Plus  30 mL Oral BID BM  . aspirin EC  81 mg Oral QAC breakfast  . atorvastatin  40 mg Oral Daily  . cilostazol  50 mg Oral BID  . docusate sodium  100 mg Oral BID  . dronabinol  2.5 mg Oral BID AC  . enoxaparin (LOVENOX) injection  20 mg Subcutaneous Q24H  . feeding supplement  237 mL Oral TID BM  . LORazepam  0.5 mg Intravenous Once  . multivitamin with minerals  1 tablet Oral Daily  . pantoprazole  40 mg Oral Daily  . QUEtiapine  25 mg Oral QHS  . sodium chloride flush  3 mL Intravenous Q12H   Labs:  Recent Labs  Lab  09/15/20 1109 09/16/20 0333 09/17/20 0355  NA 135 138 136  K 3.1* 2.7* 2.8*  CL 101 107 104  CO2 '23 22 22  ' BUN 33* 26* 15  CREATININE 1.47* 1.12* 0.99  CALCIUM 9.2 8.2* 8.1*  MG 2.1 1.8 1.6*  PHOS  --   --  2.4*  GLUCOSE 101* 93 93   CBGs 71-87-91   NUTRITION - FOCUSED PHYSICAL EXAM:  Flowsheet Row Most Recent Value  Orbital Region Severe depletion  Upper Arm Region Severe depletion  Thoracic  and Lumbar Region Severe depletion  Buccal Region Severe depletion  Temple Region Severe depletion  Clavicle Bone Region Severe depletion  Clavicle and Acromion Bone Region Severe depletion  Scapular Bone Region Severe depletion  Dorsal Hand Severe depletion  Patellar Region Severe depletion  Anterior Thigh Region Severe depletion  Posterior Calf Region Severe depletion  Edema (RD Assessment) None  Hair Reviewed  Eyes Reviewed  Mouth Reviewed  Skin Reviewed  Nails Reviewed       Diet Order:   Diet Order            Diet regular Room service appropriate? Yes; Fluid consistency: Thin  Diet effective now                 EDUCATION NEEDS:   Education needs have been addressed  Skin:  Skin Assessment: Reviewed RN Assessment  Last BM:  5/27  Height:   Ht Readings from Last 1 Encounters:  09/07/20 '5\' 4"'  (1.626 m)    Weight:   Wt Readings from Last 10 Encounters:  09/17/20 36.9 kg  09/07/20 38.6 kg  07/05/20 42.2 kg  04/01/20 40.6 kg  03/03/20 41.6 kg  12/08/19 38.8 kg  08/04/19 39.9 kg  07/05/19 42.2 kg  07/02/19 42.2 kg  07/02/19 42.2 kg   BMI:  Body mass index is 13.96 kg/m.  Estimated Nutritional Needs:   Kcal:  1300-1500  Protein:  65-75g  Fluid:  >1.3L/d    Larkin Ina, MS, RD, LDN RD pager number and weekend/on-call pager number located in [place].

## 2020-09-17 NOTE — Progress Notes (Signed)
Physical Therapy Treatment Patient Details Name: Jennifer [name] MRN: [id number] DOB: February 03, 1941 Today's Date: 09/17/2020    History of Present Illness Pt is a 80 y.o. F who presents with weakess, frequent falls, and confusion. CT head negative for acute abnormality. MRI pending. Significant PMH: HTN, osteoporosis, L1 compression fx s/p kyphoplasty.    PT Comments    Patient received in bed in fetal position with head against rail. Patient is agreeable to get oob and try to get to recliner. With attempts at mobility patient is not helping at all. Resisting when covers removed and getting agitated. Despite total assist, patient unable to get fully sitting. She will continue to benefit from skilled PT while here to improve functional mobility and strength if willing to participate.     Follow Up Recommendations  SNF;Supervision/Assistance - 24 hour     Equipment Recommendations  Other (comment) (TBD)    Recommendations for Other Services       Precautions / Restrictions Precautions Precautions: Fall Restrictions Weight Bearing Restrictions: No    Mobility  Bed Mobility Overal bed mobility: Needs Assistance Bed Mobility: Supine to Sit;Sit to Supine     Supine to sit: Max assist;Total assist Sit to supine: Max assist;Total assist   General bed mobility comments: max assist for all bed mobility, repositionng in bed. Patient resistant due to reports of being cold    Transfers                 General transfer comment: not attempted as patient is unable to get fully seated  Ambulation/Gait                 Stairs             Wheelchair Mobility    Modified Rankin (Stroke Patients Only) Modified Rankin (Stroke Patients Only) Pre-Morbid Rankin Score: Moderately severe disability Modified Rankin: Severe disability     Balance Overall balance assessment: Needs assistance   Sitting balance-Leahy Scale: Zero                                       Cognition   Behavior During Therapy: Agitated;Flat affect Overall Cognitive Status: Impaired/Different from baseline Area of Impairment: Orientation;Memory;Safety/judgement;Awareness                 Orientation Level: Disoriented to;Time;Situation   Memory: Decreased short-term memory   Safety/Judgement: Decreased awareness of safety;Decreased awareness of deficits            Exercises      General Comments        Pertinent Vitals/Pain Pain Assessment: No/denies pain    Home Living                      Prior Function            PT Goals (current goals can now be found in the care plan section) Acute Rehab PT Goals Patient Stated Goal: none stated PT Goal Formulation: Patient unable to participate in goal setting Time For Goal Achievement: 09/29/20 Progress towards PT goals: Not progressing toward goals - comment (Patient not willing to assist with mobility, lethargic, agitated)    Frequency    Min 3X/week      PT Plan Current plan remains appropriate    Co-evaluation              AM-PAC PT "6 Clicks"  Mobility   Outcome Measure  Help needed turning from your back to your side while in a flat bed without using bedrails?: A Lot Help needed moving from lying on your back to sitting on the side of a flat bed without using bedrails?: Total Help needed moving to and from a bed to a chair (including a wheelchair)?: Total Help needed standing up from a chair using your arms (e.g., wheelchair or bedside chair)?: Total Help needed to walk in hospital room?: Total Help needed climbing 3-5 steps with a railing? : Total 6 Click Score: 7    End of Session   Activity Tolerance: Patient limited by fatigue;Patient limited by lethargy;Treatment limited secondary to agitation Patient left: in bed;with call bell/phone within reach;with bed alarm set Nurse Communication: Mobility status PT Visit Diagnosis: Other abnormalities of gait  and mobility (R26.89);Muscle weakness (generalized) (M62.81)     Time: 3086-5784 PT Time Calculation (min) (ACUTE ONLY): 17 min  Charges:  $Therapeutic Activity: 8-22 mins                     Giliana Vantil, PT, GCS 09/17/20,9:52 AM

## 2020-09-17 NOTE — Progress Notes (Signed)
SLP Cancellation Note  Patient Details Name: Jennifer [name] MRN: [id number] DOB: [birth year]   Cancelled treatment:       Reason Eval/Treat Not Completed: Patient declined, no reason specified (Pt approached for speech/language/cognition evaluation. Pt stated that she feels "terrible" and requested that SLP comes back another time if she intends on talking to the pt. SLP will follow up later today as schedule allows)  Jaki Steptoe I. Vear Clock, MS, CCC-SLP Acute Rehabilitation Services Office number [phone] Pager [phone]  Jennifer [name] 09/17/2020, 10:51 AM

## 2020-09-17 NOTE — Consult Note (Signed)
Palliative Medicine Inpatient Consult Note  Reason for consult:  Goals of Care  HPI:  Per intake H&P --> 80 year old F with PMH of PVD, HTN, HLD and depression presenting with generalized weakness, intermittent nausea and vomiting, poor appetite and unintentional weight loss for about 2 weeks, and admitted for FTT, depression with passive suicidally and AKI.  Identified by MRI to have multifocal bilateral cerebral and cerebellar stroke(s).  Palliative care has been asked to get involved in the setting of ongoing failure to thrive to further address and discuss goals of care.  Clinical Assessment/Goals of Care:  *Please note that this is a verbal dictation therefore any spelling or grammatical errors are due to the "[employer]" system interpretation.  I have reviewed medical records including EPIC notes, labs and imaging, received report from bedside RN, assessed the patient was lying in bed in no acute distress.    I met with Taletha, patient's sister Benjamine Mola, and her sister-in-law to further discuss diagnosis prognosis, GOC, EOL wishes, disposition and options.  I introduced Palliative Medicine as specialized medical care for people living with serious illness. It focuses on providing relief from the symptoms and stress of a serious illness. The goal is to improve quality of life for both the patient and the family.  Mayrin was born in [place] originally.  She has never been married and does not have any children.  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  She had enjoyed traveling and was identified as being a people person.  He has faith though is nondenominational.  Prior to admission Ember was living independently and was able to perform all basic activities of daily living without assistance.  As it was shares with me that Sharon has suffered from anorexia nervosa since she was a child.  This has been a very debilitating situation for her.  Kaysia  has never ate exceptionally well and has a lot of body dysmorphia.  Reviewed the importance of continued oral intake with the patient herself.  She shares with me she knows the importance of eating and drinking.  We discussed that if she neglects to do this her life expectancy will be very limited.  We discussed initiating an appetite stimulant to better aid in supporting her nutritional intake.  Talked about starting a calorie count as well.  Reviewed continuing antidepressant medications.   Darci has a declaration for natural death identified in [place].  Concepts specific to code status, artifical feeding and hydration, continued IV antibiotics and rehospitalization was had.  Sharnika is an established DO NOT RESUSCITATE DO NOT INTUBATE CODE STATUS.   Cardiopulmonary Resuscitation: Do Not Attempt Resuscitation (DNR/No CPR)  Medical Interventions: Limited Additional Interventions: Use medical treatment, IV fluids and cardiac monitoring as indicated, DO NOT USE intubation or mechanical ventilation. May consider use of less invasive airway support such as BiPAP or CPAP. Also provide comfort measures. Transfer to the hospital if indicated. Avoid intensive care.   Antibiotics: Determine use of limitation of antibiotics when infection occurs  IV Fluids: IV fluids for a defined trial period  Feeding Tube: No feeding tube    The difference between a aggressive medical intervention path  and a palliative comfort care path for this patient at this time was had.   We agreed to pursue a time trial of treatments to see if Annmargaret's condition can turn around.  A few days prior she had had a similar admission with tremendous improvement after going to  skilled nursing.  I asked Annisa if she be willing to get out of bed with me so that I may get a better estimation of her motility.  Sharleen was able to stand up and pivot to the chair.  She did this with one-person assist.  Tolerated well with front wheel  walker.  Discussed the importance of continued conversation with family and their  medical providers regarding overall plan of care and treatment options, ensuring decisions are within the context of the patients values and GOCs.  Provided "Hard Choices for [employer]" booklet.   Decision Maker: Philip Aspen (sister) [phone]  SUMMARY OF RECOMMENDATIONS   DNAR/DNI  MOST Completed, paper copy placed onto the chart electric copy can be found in [facility]  DNR Form Completed, paper copy placed onto the chart electric copy can be found in [facility]  Time Trial of treatments (Started 5/27 - Reassess on 5/29)  Initiate an appetite stimulant  Continue antidepressants  Ongoing conversations with patient and her family  Per review of therapy notes skilled nursing is recommended  Would benefit from OP Palliative support if improvements are made  Code Status/Advance Care Planning: DNAR/DNI   Symptom Management:  Failure to Thrive:  - Dietician involved  - Calorie Count  - Encourage 1:1 feeding  - Supplemental nutrients  - Will trial Marinol 2.66m PO BID  Depression:  -Appreciate psychiatry insights  -Continue Lexapro  Palliative Prophylaxis:   Oral care, mobility  Additional Recommendations (Limitations, Scope, Preferences):  Treat what is treatable  Psycho-social/Spiritual:   Desire for further Chaplaincy support:  No  Additional Recommendations:  Education on recurrent failure to thrive   Prognosis:  Prognosis is guarded in the setting of hypoalbuminemia  Discharge Planning: Discharge likely to SNF with OP Palliative support if able to me medically optimized.  Vitals:   09/16/20 2025 09/17/20 0416  BP: (!) 149/59 117/83  Pulse: 79 97  Resp: 16 17  Temp: 98.5 F (36.9 C) 99.8 F (37.7 C)  SpO2: 98% 96%    Intake/Output Summary (Last 24 hours) at 09/17/2020 [id number] data filed at 09/16/2020 2300 Gross per 24 hour  Intake 1417.74 ml  Output 1650  ml  Net -232.26 ml   Last Weight  Most recent update: 09/17/2020  4:32 AM   Weight  36.9 kg (81 lb 5.6 oz)           Gen:  Elderly F in NAD HEENT: moist mucous membranes CV: Regular rate and rhythm  PULM: clear to auscultation bilaterally  ABD: soft/nontender  EXT: No edema  Neuro: Alert and oriented x2-3  PPS: 40%   This conversation/these recommendations were discussed with patient primary care team, Dr. GCyndia Skeeters Time In: 1100 Time Out: 1300 Total Time: 120 Greater than 50%  of this time was spent counseling and coordinating care related to the above assessment and plan.  [facility] Team Cell Phone: [phone] utilize secure chat with additional questions, if there is no response within 30 minutes please call the above phone number  Palliative Medicine Team providers are available by phone from 7am to 7pm daily and can be reached through the team cell phone.  Should this patient require assistance outside of these hours, please call the patient's attending physician.

## 2020-09-17 NOTE — Progress Notes (Signed)
Scan of the brain shows multiple small embolic infarctsSTROKE TEAM PROGRESS NOTE   INTERVAL HISTORY Patient presented with weakness and numerous falls and failure to thrive..  CT angiogram is motion degraded but shows 65% proximal left ICA stenosis.  CT chest shows possible pulmonary mass.  Patient is a chronic smoker.  Echocardiogram shows normal ejection fraction.  Vitals:   09/16/20 2025 09/17/20 0416 09/17/20 0432 09/17/20 0842  BP: (!) 149/59 117/83  136/73  Pulse: 79 97  90  Resp: 16 17  16   Temp: 98.5 F (36.9 C) 99.8 F (37.7 C)  98.2 F (36.8 C)  TempSrc: Oral Oral  Oral  SpO2: 98% 96%  95%  Weight:   36.9 kg    CBC:  Recent Labs  Lab 09/16/20 0333 09/17/20 0355  WBC 7.4 8.7  HGB 9.0* 9.0*  HCT 27.0* 27.6*  MCV 78.5* 78.6*  PLT 72* 89*   Basic Metabolic Panel:  Recent Labs  Lab 09/16/20 0333 09/17/20 0355  NA 138 136  K 2.7* 2.8*  CL 107 104  CO2 22 22  GLUCOSE 93 93  BUN 26* 15  CREATININE 1.12* 0.99  CALCIUM 8.2* 8.1*  MG 1.8 1.6*  PHOS  --  2.4*   Lipid Panel:  Recent Labs  Lab 09/17/20 0355  CHOL 142  TRIG 112  HDL 31*  CHOLHDL 4.6  VLDL 22  LDLCALC 89   HgbA1c: No results for input(s): HGBA1C in the last 168 hours. Urine Drug Screen: No results for input(s): LABOPIA, COCAINSCRNUR, LABBENZ, AMPHETMU, THCU, LABBARB in the last 168 hours.  Alcohol Level No results for input(s): ETH in the last 168 hours.  IMAGING past 24 hours CT ANGIO HEAD NECK W WO CM  Result Date: 09/16/2020 CLINICAL DATA:  Stroke. EXAM: CT ANGIOGRAPHY HEAD AND NECK TECHNIQUE: Multidetector CT imaging of the head and neck was performed using the standard protocol during bolus administration of intravenous contrast. Multiplanar CT image reconstructions and MIPs were obtained to evaluate the vascular anatomy. Carotid stenosis measurements (when applicable) are obtained utilizing NASCET criteria, using the distal internal carotid diameter as the denominator. CONTRAST:  74mL  OMNIPAQUE IOHEXOL 350 MG/ML SOLN COMPARISON:  Head CT and MRI 09/15/2020 FINDINGS: CT HEAD FINDINGS Brain: Numerous scattered small acute infarcts throughout the cerebrum and cerebellum bilaterally are better demonstrated on yesterday's MRI. No acute intracranial hemorrhage, mass, midline shift, or extra-axial fluid collection is identified. There is a background of moderately advanced chronic small vessel ischemia in the cerebral white matter. A chronic lacunar infarct is again noted in the right thalamus. Small chronic cerebellar infarcts shown on MRI are poorly demonstrated on CT due to motion and skull base artifact. There is moderate cerebral atrophy. Vascular: Calcified atherosclerosis at the skull base. Skull: No fracture within limitations of motion artifact. Left lateral skull osteoma. Sinuses: Persistent complete opacification of the left maxillary sinus and partial opacification of left anterior ethmoid air cells. No significant mastoid fluid. Orbits: Right cataract extraction. Review of the MIP images confirms the above findings CTA NECK FINDINGS Aortic arch: Incomplete imaging of the aortic arch including of the brachiocephalic and left common carotid origins. Diffuse calcified atherosclerotic plaque in the included portion of the arch and subclavian arteries. Characterization of a suspected underlying proximal left subclavian stenosis is limited by motion. Right carotid system: Patent with a small to moderate amount of calcified plaque in the proximal common carotid artery not resulting in a flow limiting stenosis. Prominent calcified plaque at the carotid bifurcation  results in less than 50% stenosis of the origin of the ICA. Widely patent and mildly ectatic cervical ICA more distally. Left carotid system: Patent with moderate calcified plaque throughout the proximal and mid common carotid artery with assessment of the proximal common carotid artery limited by motion. Extensive calcified plaque at the  carotid bifurcation resulting in approximately 65% proximal ICA stenosis with assessment limited by the heavy calcification of the plaque which results in suboptimal visualization of the residual patent lumen. Widely patent and mildly ectatic cervical ICA more distally. Vertebral arteries: The vertebral arteries are patent and codominant. Motion artifact limits assessment for stenosis in the V1 and V2 segments. Skeleton: Advanced lower cervical disc degeneration. Other neck: Limited assessment of the soft tissues of the neck due to technique and motion. No gross mass or lymphadenopathy identified. Upper chest: Emphysema and scarring in the lung apices. Review of the MIP images confirms the above findings CTA HEAD FINDINGS Anterior circulation: The internal carotid arteries are patent from skull base to carotid termini with moderate atherosclerotic plaque bilaterally not resulting in a significant stenosis within limitations of artifact. Both cavernous segments are tortuous. ACAs and MCAs are patent without evidence of a proximal branch occlusion. There is calcified plaque in the proximal right M1 segment without evidence of an associated significant stenosis. ACA and MCA branch vessels appear diffusely attenuated and irregular bilaterally, and branch vessel assessment is limited by motion. No aneurysm is identified. Posterior circulation: The intracranial vertebral arteries are patent to the basilar with atherosclerotic irregularity resulting in mild multifocal narrowing on the left. There may be more significant narrowing of the distal right V4 segment although the vessel is suboptimally visualized due to artifact. The basilar artery is patent with mild narrowing proximally. There are right larger than left posterior communicating arteries with a fetal origin noted of the right PCA. Both PCAs are patent proximally although there is marked attenuation and irregularity of PCA branch vessels bilaterally. There is also  moderate to severe stenosis of the proximal left P2 segment. A small calcification is noted at the left P2 bifurcation. No aneurysm is identified. Venous sinuses: Not well evaluated due to contrast timing. Anatomic variants: Fetal right PCA. Review of the MIP images confirms the above findings IMPRESSION: 1. Motion degraded study. 2. No emergent large vessel occlusion. 3. Widespread atherosclerosis with 65% stenosis of the proximal left ICA. 4. Aortic Atherosclerosis (ICD10-I70.0) and Emphysema (ICD10-J43.9). Electronically Signed   By: Sebastian Ache M.D.   On: 09/16/2020 20:08   ECHOCARDIOGRAM COMPLETE  Result Date: 09/16/2020    ECHOCARDIOGRAM REPORT   Patient Name:   Jennifer [name] Date of Exam: 09/16/2020 Medical Rec #:  [id number]        Height:       64.0 in Accession #:    [id number]       Weight:       82.9 lb Date of Birth:  03-28-1941        BSA:          1.345 m Patient Age:    80 years         BP:           151/78 mmHg Patient Gender: F                HR:           72 bpm. Exam Location:  Inpatient Procedure: 2D Echo, Cardiac Doppler and Color Doppler Indications:    Stroke  History:  Patient has prior history of Echocardiogram examinations, most                 recent 01/03/2017. Signs/Symptoms:Altered Mental Status.  Sonographer:    Roosvelt Maser RDCS Referring Phys: [id number] Almon Hercules  Sonographer Comments: Technically difficult due to no cooperation. Patient wanted to remain curled up in a ball and be left alone. Difficult due to patient is 82 lbs. Difficult windows. IMPRESSIONS  1. Very difficult study due to poor patient cooperation due to altered mental status. Globally LV function appears normal, but not all walls were not assessed. Very limited evaluation of cardiac valves as the patient could not cooperate with the exam.  2. Left ventricular ejection fraction, by estimation, is 60 to 65%. The left ventricle has normal function. Left ventricular endocardial border not optimally defined  to evaluate regional wall motion. There is moderate asymmetric left ventricular hypertrophy of the basal-septal segment. Left ventricular diastolic function could not be evaluated.  3. Right ventricular systolic function is normal. The right ventricular size is normal.  4. The mitral valve is grossly normal. No evidence of mitral valve regurgitation. No evidence of mitral stenosis.  5. The aortic valve is calcified. Aortic valve regurgitation is mild. Mild to moderate aortic valve sclerosis/calcification is present, without any evidence of aortic stenosis. FINDINGS  Left Ventricle: Left ventricular ejection fraction, by estimation, is 60 to 65%. The left ventricle has normal function. Left ventricular endocardial border not optimally defined to evaluate regional wall motion. The left ventricular internal cavity size was normal in size. There is moderate asymmetric left ventricular hypertrophy of the basal-septal segment. Left ventricular diastolic function could not be evaluated due to nondiagnostic images. Left ventricular diastolic function could not be evaluated. Right Ventricle: The right ventricular size is normal. No increase in right ventricular wall thickness. Right ventricular systolic function is normal. Left Atrium: Left atrial size was normal in size. Right Atrium: Right atrial size was normal in size. Pericardium: Trivial pericardial effusion is present. Mitral Valve: The mitral valve is grossly normal. Mild mitral annular calcification. No evidence of mitral valve regurgitation. No evidence of mitral valve stenosis. Tricuspid Valve: The tricuspid valve is not well visualized. Aortic Valve: The aortic valve is calcified. Aortic valve regurgitation is mild. Mild to moderate aortic valve sclerosis/calcification is present, without any evidence of aortic stenosis. Pulmonic Valve: The pulmonic valve was not well visualized. Aorta: The aortic root and ascending aorta are structurally normal, with no evidence  of dilitation. IAS/Shunts: The atrial septum is grossly normal.  LEFT VENTRICLE PLAX 2D LVIDd:         3.60 cm LVIDs:         2.60 cm LV PW:         1.00 cm LV IVS:        1.40 cm LVOT diam:     1.90 cm LV SV:         51 LV SV Index:   38 LVOT Area:     2.84 cm  LEFT ATRIUM           Index LA diam:      3.50 cm 2.60 cm/m LA Vol (A4C): 27.1 ml 20.14 ml/m  AORTIC VALVE LVOT Vmax:   106.00 cm/s LVOT Vmean:  67.900 cm/s LVOT VTI:    0.180 m  AORTA Ao Root diam: 2.90 cm  SHUNTS Systemic VTI:  0.18 m Systemic Diam: 1.90 cm Lennie Odor MD Electronically signed by Lennie Odor MD Signature Date/Time:  09/16/2020/4:38:43 PM    Final    PHYSICAL EXAM Constitutional: Frail petite malnourished looking elderly Caucasian lady not in distress.  Psych: Affect appropriate to situation,   Eyes: No scleral injection HENT: No oropharyngeal obstruction.  MSK: no joint deformities.  Cardiovascular: Normal rate and regular rhythm.  Respiratory: Effort normal, non-labored breathing GI: Soft.  No distension. There is no tenderness.  Skin: Warm dry and intact visible skin  Neuro: Mental Status: Patient is awake alert and interactive.  Speech is clear without dysarthria.  She has diminished attention, registration and recall.  She follows simple 1 and some two-step commands.   Patient is awake, alert, oriented to person and place, but not to month, year, and situation.  Patient is unable to give a clear and coherent history.  No signs of aphasia  She extinguishes to double simultaneous stimulation, appears to have some left neglect She unable to name the president Cranial Nerves: II: She has a left hemianopia.  Pupils are equal, round, and reactive to light.    III,IV, VI: EOMI without ptosis or diploplia.  V: Facial sensation is symmetric to temperature VII: Facial movement with mild flattening of left nasolabial fold VIII: hearing is intact to voice X: Unable to assess  XI: Shoulder shrug is symmetric. XII:  tongue is midline without atrophy or fasciculations.   Motor: She does not cooperate with formal strength testing but she does move all extremities spontaneously.  But has spastic right hemiparesis with contracture of the right upper extremities  and moves right side less than the left  Sensory: Sensation is symmetric to light touch and temperature in the arms and legs   Deep Tendon Reflexes: 2+ and symmetric in the biceps and patellae.     Cerebellar: Unable to assess   ASSESSMENT/PLAN  Jennifer [name] is a 80 y.o. female, an at least 1ppd smoker (62 pack year history) with prior history of left lobe mass (untreated), PVD, htn, HLD persenting with weakness and numerous falls occurring for the past several weeks, associated with dizziness, as well as poor appetite/anorexia with unexpected weight loss of 10lbs in one month. She presented outside the tPA window. Work up showed showed new 1cm mass at lateral left upper lobe found on chest CT as well as both acute and remote strokes. Admitted for ongoing stroke work up, failure to thrive, depression with passive suicidally and AKI.   Multifocal ischemic strokes of both cerebral and cerebellar hemispheres  Code Stroke CT head No acute abnormality.   CTA head & neck: No LVO, Widespread atherosclerosis with 65% stenosis of the proximal left ICA.  MRI: Multifocal ischemic infarcts.  Underlying moderately advanced cerebral atrophy with chronic microvascular ischemic disease, with multiple additional remote lacunar infarcts about the bilateral thalami and pons. Multiple chronic micro hemorrhages   Carotid Doppler pending  2D Echo (poor study)  EF 60-65%. No shunt, wall motion abnormality or thrombus seen but study was limited.   LDL 89  HgbA1c 6.0  VTE prophylaxis - SCDs    Diet   Diet regular Room service appropriate? Yes; Fluid consistency: Thin   On ASA 81mg  prior to admission   If aggressive care planned then  APT x 3 weeks then  aspirin alone  Therapy recommendations:  SNF  Disposition:  TBD  Goals of Care: Palliative care following. Now DNR/DNI  Hypertension . Permissive hypertension (OK if < 220/120) but gradually normalize in 5-7 days . Long-term BP goal normotensive  Hyperlipidemia  Home meds: Lipitor  20mg  daily  LDL 89, goal < 70  High intensity statin may be indicated if aggressive care approach  Continue statin at discharge   Other Stroke Risk Factors  Advanced Age >/= 31   Current Cigarette smoker, 31 pack year smoking history, advised to stop smoking.  Clotting disorder on Pletal  Other Active Problems  Malnutrition  Suspected new diagnosis of lung cancer  Hospital day # 1  I have personally obtained history,examined this patient, reviewed notes, independently viewed imaging studies, participated in medical decision making and plan of care.ROS completed by me personally and pertinent positives fully documented  I have made any additions or clarifications directly to the above note. Agree with note above.  Patient has presented with failure to thrive and frequent falls and generalized weakness and MRI shows multiple bilateral small embolic infarcts with CT angiogram showing diffuse intracranial atherosclerosis and moderate proximal left carotid stenosis.  There is also question of diagnosis of new lung cancer.  Palliative care team has been consulted and will await family decision about goals of care.  If they want to be aggressive recommend aspirin Plavix for 3 weeks followed by aspirin alone.  Patient needs to quit smoking.  Continue current ongoing stroke work-up and aggressive risk factor modification.  Greater than 50% time during this 35-minute visit were spent in counseling and coordination of care and discussion with care team.  Discussed with Dr. 76.  Alanda Slim, MD Medical Director [facility] Pager: [phone] 09/17/2020 4:04 PM   To contact Stroke  Continuity provider, please refer to 09/19/2020. After hours, contact General Neurology

## 2020-09-17 NOTE — Progress Notes (Addendum)
PROGRESS NOTE  Jennifer [name] FIE:[id number] DOB: Sep 04, 1940   PCP: Jennifer Sanes, MD  Patient is from: Home.  Lives alone.  DOA: 09/15/2020 LOS: 1  Chief complaints: Generalized weakness  Brief Narrative / Interim history: 80 year old F with PMH of PVD, HTN, HLD, tobacco abuse and depression presenting with generalized weakness, intermittent nausea and vomiting, poor appetite and unintentional weight loss for about 2 weeks, and admitted for FTT, depression with passive suicidally and AKI.  Patient was recently started on Lexapro.  MRI brain showed multifocal infarcts including cortical and subcortical areas of both cerebral and cerebellar hemispheres.  Patient is already outside tPA window.  Neurology consulted the next morning.  TTE without significant finding.  Subjective: Seen and examined earlier this morning.  Continues to feel fatigued and tired.  No other complaints.  She denies headache, vision change, chest pain, dyspnea, GI or UTI symptoms.  Objective: Vitals:   09/16/20 2025 09/17/20 0416 09/17/20 0432 09/17/20 0842  BP: (!) 149/59 117/83  136/73  Pulse: 79 97  90  Resp: 16 17  16   Temp: 98.5 F (36.9 C) 99.8 F (37.7 C)  98.2 F (36.8 C)  TempSrc: Oral Oral  Oral  SpO2: 98% 96%  95%  Weight:   36.9 kg     Intake/Output Summary (Last 24 hours) at 09/17/2020 1359 Last data filed at 09/17/2020 1212 Gross per 24 hour  Intake 1417.74 ml  Output 1300 ml  Net 117.74 ml   Filed Weights   09/16/20 0017 09/17/20 0432  Weight: 37.6 kg 36.9 kg    Examination:   GENERAL: Frail looking elderly female. HEENT: MMM.  Vision and hearing grossly intact.  NECK: Supple.  No apparent JVD.  RESP: On RA.  No IWOB.  Fair aeration bilaterally. CVS:  RRR. Heart sounds normal.  ABD/GI/GU: BS+. Abd soft, NTND.  MSK/EXT:  Moves extremities.  Significant muscle mass and subcu fat loss. SKIN: no apparent skin lesion or wound NEURO: Awake and alert. Oriented appropriately.  No  apparent focal neuro deficit. PSYCH: Somewhat flat affect.  Procedures:  None  Microbiology summarized: COVID-19 and influenza PCR nonreactive.  Assessment & Plan: Multifocal bilateral cerebral and cerebellar stroke-noted on MRI brain.  No focal neurodeficit but generalized weakness.  Pattern suggestive for embolic CVA.  She is already outside tPA window.  TTE without significant finding. -Neurology and palliative medicine following -Follow carotid ultrasound -Continue aspirin and statin -Continue PT/OT   Failure to thrive in adult- seems to be psychogenic but CVA could be contributing as well.  She also have new 1 cm LUL nodule which is concerning given her smoking history.  Presents with poor appetite, unintentional weight loss, depressed mood, passive suicidal ideation and intermittent nausea and vomiting.  She has significant muscle mass and subcu fat loss on exam.  Also some concern about cognitive decline.  MRI brain with multifocal stroke which might be contributing.  TSH and CK within normal. -Liberate diet-upright for meals given large hiatal hernia -Agree with Marinol for appetite -Appreciate input by psychiatry, neurology, palliative medicine and dietitian  Major depression with passive suicidal ideation -Appreciate input by psychiatry-stop Lexapro and continue low-dose Seroquel -Psychiatry signed off.  AKI/azotemia: Likely prerenal in the setting of poor p.o. intake.  Resolving Recent Labs    12/09/19 1433 07/05/20 1436 09/07/20 1504 09/15/20 1109 09/16/20 0333 09/17/20 0355  BUN 22 14 13  33* 26* 15  CREATININE 0.89 0.97 1.01 1.47* 1.12* 0.99  -Continue IV fluid -Continue monitoring  Hypokalemia/hypomagnesemia: K 2.7.  Mg 1.6 -K-Dur 40 mill equivalent x3 -IV magnesium sulfate 2 g x 1  Essential hypertension: Normotensive. -Continue holding home amlodipine  LUL nodule concerning for malignancy given patient's heavy smoking history-about 1 cm LUL nodule seen  on CT chest.  -Repeat CT chest or PET scan in 3 months  Ascending aortic aneurysm-about 4.4 cm -Annual imaging recommended  Emphysema/pulmonary scarring-likely from heavy smoking. -No respiratory symptoms  HLD -Continue Lipitor  PVD -Continue ASA, Pletal  Tobacco use disorder -Encourage cessation -Nicotine patch  Thrombocytopenia: Improved.  Due to malnutrition? Recent Labs  Lab 09/15/20 1057 09/16/20 0333 09/17/20 0355  PLT 98* 72* 89*  -Continue monitoring  Goal of care-significant comorbidities as above.  Guarded prognosis but early to tell her response to treatment.  DNR/DNI appropriate.  Palliative medicine following.  Severe malnutrition/FTT-decreased appetite with unintentional weight loss Body mass index is 13.96 kg/m.  -See above.       DVT prophylaxis:  On subcu Lovenox.  Code Status: DNR/DNI Family Communication: Updated patient brother over the phone on 5/26.  Attempted to call patient's sister but no answer. Level of care: Telemetry Medical  Status is: Inpatient  Remains inpatient appropriate because:Persistent severe electrolyte disturbances, Ongoing diagnostic testing needed not appropriate for outpatient work up, Unsafe d/c plan, IV treatments appropriate due to intensity of illness or inability to take PO and Inpatient level of care appropriate due to severity of illness   Dispo: The patient is from: Home              Anticipated d/c is to: SNF or residential hospice based on clinical progress              Patient currently is not medically stable to d/c.   Difficult to place patient No          Consultants:  Neurology Palliative medicine Psychiatry-signed off   Sch Meds:  Scheduled Meds: . aspirin EC  81 mg Oral QAC breakfast  . atorvastatin  40 mg Oral Daily  . cilostazol  50 mg Oral BID  . docusate sodium  100 mg Oral BID  . dronabinol  2.5 mg Oral BID AC  . enoxaparin (LOVENOX) injection  20 mg Subcutaneous Q24H  .  feeding supplement  237 mL Oral BID BM  . LORazepam  0.5 mg Intravenous Once  . pantoprazole  40 mg Oral Daily  . potassium chloride  40 mEq Oral Q3H  . QUEtiapine  25 mg Oral QHS  . sodium chloride flush  3 mL Intravenous Q12H   Continuous Infusions: . lactated ringers 75 mL/hr at 09/16/20 1820   PRN Meds:.acetaminophen **OR** acetaminophen, albuterol, ALPRAZolam, bisacodyl, HYDROcodone-acetaminophen, morphine injection, nicotine, ondansetron **OR** ondansetron (ZOFRAN) IV, polyethylene glycol  Antimicrobials: Anti-infectives (From admission, onward)   None       I have personally reviewed the following labs and images: CBC: Recent Labs  Lab 09/15/20 1057 09/16/20 0333 09/17/20 0355  WBC 9.6 7.4 8.7  HGB 11.9* 9.0* 9.0*  HCT 37.5 27.0* 27.6*  MCV 81.0 78.5* 78.6*  PLT 98* 72* 89*   BMP &GFR Recent Labs  Lab 09/15/20 1109 09/16/20 0333 09/17/20 0355  NA 135 138 136  K 3.1* 2.7* 2.8*  CL 101 107 104  CO2 GLUCOSE 101* 93 93  BUN 33* 26* 15  CREATININE 1.47* 1.12* 0.99  CALCIUM 9.2 8.2* 8.1*  MG 2.1 1.8 1.6*  PHOS  --   --  2.4*  Estimated Creatinine Clearance: 25.5 mL/min (by C-G formula based on SCr of 0.99 mg/dL). Liver & Pancreas: Recent Labs  Lab 09/15/20 1109 09/17/20 0355  AST 24  --   ALT 8  --   ALKPHOS 71  --   BILITOT 0.9  --   PROT 7.7  --   ALBUMIN 3.4* 2.7*   No results for input(s): LIPASE, AMYLASE in the last 168 hours. No results for input(s): AMMONIA in the last 168 hours. Diabetic: No results for input(s): HGBA1C in the last 72 hours. Recent Labs  Lab 09/15/20 1617 09/16/20 2202 09/17/20 0106 09/17/20 0428  GLUCAP 95 71 87 91   Cardiac Enzymes: Recent Labs  Lab 09/16/20 0333 09/17/20 0355  CKTOTAL 111 133   No results for input(s): PROBNP in the last 8760 hours. Coagulation Profile: No results for input(s): INR, PROTIME in the last 168 hours. Thyroid Function Tests: Recent Labs    09/15/20 1110  TSH  3.272   Lipid Profile: Recent Labs    09/17/20 0355  CHOL 142  HDL 31*  LDLCALC 89  TRIG 119112  CHOLHDL 4.6   Anemia Panel: No results for input(s): VITAMINB12, FOLATE, FERRITIN, TIBC, IRON, RETICCTPCT in the last 72 hours. Urine analysis:    Component Value Date/Time   COLORURINE YELLOW 09/16/2020 1522   APPEARANCEUR HAZY (A) 09/16/2020 1522   LABSPEC 1.018 09/16/2020 1522   PHURINE 7.0 09/16/2020 1522   GLUCOSEU NEGATIVE 09/16/2020 1522   GLUCOSEU NEGATIVE 04/21/2015 1135   HGBUR LARGE (A) 09/16/2020 1522   BILIRUBINUR NEGATIVE 09/16/2020 1522   KETONESUR NEGATIVE 09/16/2020 1522   PROTEINUR NEGATIVE 09/16/2020 1522   UROBILINOGEN 0.2 04/21/2015 1135   NITRITE NEGATIVE 09/16/2020 1522   LEUKOCYTESUR TRACE (A) 09/16/2020 1522   Sepsis Labs: Invalid input(s): PROCALCITONIN, LACTICIDVEN  Microbiology: Recent Results (from the past 240 hour(s))  Resp Panel by RT-PCR (Flu A&B, Covid) Nasopharyngeal Swab     Status: None   Collection Time: 09/15/20  1:07 PM   Specimen: Nasopharyngeal Swab; Nasopharyngeal(NP) swabs in vial transport medium  Result Value Ref Range Status   SARS Coronavirus 2 by RT PCR NEGATIVE NEGATIVE Final    Comment: (NOTE) SARS-CoV-2 target nucleic acids are NOT DETECTED.  The SARS-CoV-2 RNA is generally detectable in upper respiratory specimens during the acute phase of infection. The lowest concentration of SARS-CoV-2 viral copies this assay can detect is 138 copies/mL. A negative result does not preclude SARS-Cov-2 infection and should not be used as the sole basis for treatment or other patient management decisions. A negative result may occur with  improper specimen collection/handling, submission of specimen other than nasopharyngeal swab, presence of viral mutation(s) within the areas targeted by this assay, and inadequate number of viral copies(<138 copies/mL). A negative result must be combined with clinical observations, patient history,  and epidemiological information. The expected result is Negative.  Fact Sheet for Patients:  BloggerCourse.comhttps://www.fda.gov/media/152166/download  Fact Sheet for Healthcare Providers:  SeriousBroker.ithttps://www.fda.gov/media/152162/download  This test is no t yet approved or cleared by the [place] FDA and  has been authorized for detection and/or diagnosis of SARS-CoV-2 by FDA under an Emergency Use Authorization (EUA). This EUA will remain  in effect (meaning this test can be used) for the duration of the COVID-19 declaration under Section 564(b)(1) of the Act, 21 U.S.C.section 360bbb-3(b)(1), unless the authorization is terminated  or revoked sooner.       Influenza A by PCR NEGATIVE NEGATIVE Final   Influenza B by PCR NEGATIVE NEGATIVE Final  Comment: (NOTE) The Xpert Xpress SARS-CoV-2/FLU/RSV plus assay is intended as an aid in the diagnosis of influenza from Nasopharyngeal swab specimens and should not be used as a sole basis for treatment. Nasal washings and aspirates are unacceptable for Xpert Xpress SARS-CoV-2/FLU/RSV testing.  Fact Sheet for Patients: BloggerCourse.com  Fact Sheet for Healthcare Providers: SeriousBroker.it  This test is not yet approved or cleared by the [place] FDA and has been authorized for detection and/or diagnosis of SARS-CoV-2 by FDA under an Emergency Use Authorization (EUA). This EUA will remain in effect (meaning this test can be used) for the duration of the COVID-19 declaration under Section 564(b)(1) of the Act, 21 U.S.C. section 360bbb-3(b)(1), unless the authorization is terminated or revoked.  Performed at [facility], [address] [place], [place] [address]     Radiology Studies: CT ANGIO HEAD NECK W WO CM  Result Date: 09/16/2020 CLINICAL DATA:  Stroke. EXAM: CT ANGIOGRAPHY HEAD AND NECK TECHNIQUE: Multidetector CT imaging of the head and neck was performed using the standard  protocol during bolus administration of intravenous contrast. Multiplanar CT image reconstructions and MIPs were obtained to evaluate the vascular anatomy. Carotid stenosis measurements (when applicable) are obtained utilizing NASCET criteria, using the distal internal carotid diameter as the denominator. CONTRAST:  36mL OMNIPAQUE IOHEXOL 350 MG/ML SOLN COMPARISON:  Head CT and MRI 09/15/2020 FINDINGS: CT HEAD FINDINGS Brain: Numerous scattered small acute infarcts throughout the cerebrum and cerebellum bilaterally are better demonstrated on yesterday's MRI. No acute intracranial hemorrhage, mass, midline shift, or extra-axial fluid collection is identified. There is a background of moderately advanced chronic small vessel ischemia in the cerebral white matter. A chronic lacunar infarct is again noted in the right thalamus. Small chronic cerebellar infarcts shown on MRI are poorly demonstrated on CT due to motion and skull base artifact. There is moderate cerebral atrophy. Vascular: Calcified atherosclerosis at the skull base. Skull: No fracture within limitations of motion artifact. Left lateral skull osteoma. Sinuses: Persistent complete opacification of the left maxillary sinus and partial opacification of left anterior ethmoid air cells. No significant mastoid fluid. Orbits: Right cataract extraction. Review of the MIP images confirms the above findings CTA NECK FINDINGS Aortic arch: Incomplete imaging of the aortic arch including of the brachiocephalic and left common carotid origins. Diffuse calcified atherosclerotic plaque in the included portion of the arch and subclavian arteries. Characterization of a suspected underlying proximal left subclavian stenosis is limited by motion. Right carotid system: Patent with a small to moderate amount of calcified plaque in the proximal common carotid artery not resulting in a flow limiting stenosis. Prominent calcified plaque at the carotid bifurcation results in less  than 50% stenosis of the origin of the ICA. Widely patent and mildly ectatic cervical ICA more distally. Left carotid system: Patent with moderate calcified plaque throughout the proximal and mid common carotid artery with assessment of the proximal common carotid artery limited by motion. Extensive calcified plaque at the carotid bifurcation resulting in approximately 65% proximal ICA stenosis with assessment limited by the heavy calcification of the plaque which results in suboptimal visualization of the residual patent lumen. Widely patent and mildly ectatic cervical ICA more distally. Vertebral arteries: The vertebral arteries are patent and codominant. Motion artifact limits assessment for stenosis in the V1 and V2 segments. Skeleton: Advanced lower cervical disc degeneration. Other neck: Limited assessment of the soft tissues of the neck due to technique and motion. No gross mass or lymphadenopathy identified. Upper chest: Emphysema and  scarring in the lung apices. Review of the MIP images confirms the above findings CTA HEAD FINDINGS Anterior circulation: The internal carotid arteries are patent from skull base to carotid termini with moderate atherosclerotic plaque bilaterally not resulting in a significant stenosis within limitations of artifact. Both cavernous segments are tortuous. ACAs and MCAs are patent without evidence of a proximal branch occlusion. There is calcified plaque in the proximal right M1 segment without evidence of an associated significant stenosis. ACA and MCA branch vessels appear diffusely attenuated and irregular bilaterally, and branch vessel assessment is limited by motion. No aneurysm is identified. Posterior circulation: The intracranial vertebral arteries are patent to the basilar with atherosclerotic irregularity resulting in mild multifocal narrowing on the left. There may be more significant narrowing of the distal right V4 segment although the vessel is suboptimally  visualized due to artifact. The basilar artery is patent with mild narrowing proximally. There are right larger than left posterior communicating arteries with a fetal origin noted of the right PCA. Both PCAs are patent proximally although there is marked attenuation and irregularity of PCA branch vessels bilaterally. There is also moderate to severe stenosis of the proximal left P2 segment. A small calcification is noted at the left P2 bifurcation. No aneurysm is identified. Venous sinuses: Not well evaluated due to contrast timing. Anatomic variants: Fetal right PCA. Review of the MIP images confirms the above findings IMPRESSION: 1. Motion degraded study. 2. No emergent large vessel occlusion. 3. Widespread atherosclerosis with 65% stenosis of the proximal left ICA. 4. Aortic Atherosclerosis (ICD10-I70.0) and Emphysema (ICD10-J43.9). Electronically Signed   By: Sebastian Ache M.D.   On: 09/16/2020 20:08   ECHOCARDIOGRAM COMPLETE  Result Date: 09/16/2020    ECHOCARDIOGRAM REPORT   Patient Name:   Jennifer [name] Date of Exam: 09/16/2020 Medical Rec #:  [id number]        Height:       64.0 in Accession #:    [id number]       Weight:       82.9 lb Date of Birth:  02-12-1941        BSA:          1.345 m Patient Age:    80 years         BP:           151/78 mmHg Patient Gender: F                HR:           72 bpm. Exam Location:  Inpatient Procedure: 2D Echo, Cardiac Doppler and Color Doppler Indications:    Stroke  History:        Patient has prior history of Echocardiogram examinations, most                 recent 01/03/2017. Signs/Symptoms:Altered Mental Status.  Sonographer:    Roosvelt Maser RDCS Referring Phys: [id number] Almon Hercules  Sonographer Comments: Technically difficult due to no cooperation. Patient wanted to remain curled up in a ball and be left alone. Difficult due to patient is 82 lbs. Difficult windows. IMPRESSIONS  1. Very difficult study due to poor patient cooperation due to altered mental status.  Globally LV function appears normal, but not all walls were not assessed. Very limited evaluation of cardiac valves as the patient could not cooperate with the exam.  2. Left ventricular ejection fraction, by estimation, is 60 to 65%. The left ventricle has normal function. Left ventricular endocardial  border not optimally defined to evaluate regional wall motion. There is moderate asymmetric left ventricular hypertrophy of the basal-septal segment. Left ventricular diastolic function could not be evaluated.  3. Right ventricular systolic function is normal. The right ventricular size is normal.  4. The mitral valve is grossly normal. No evidence of mitral valve regurgitation. No evidence of mitral stenosis.  5. The aortic valve is calcified. Aortic valve regurgitation is mild. Mild to moderate aortic valve sclerosis/calcification is present, without any evidence of aortic stenosis. FINDINGS  Left Ventricle: Left ventricular ejection fraction, by estimation, is 60 to 65%. The left ventricle has normal function. Left ventricular endocardial border not optimally defined to evaluate regional wall motion. The left ventricular internal cavity size was normal in size. There is moderate asymmetric left ventricular hypertrophy of the basal-septal segment. Left ventricular diastolic function could not be evaluated due to nondiagnostic images. Left ventricular diastolic function could not be evaluated. Right Ventricle: The right ventricular size is normal. No increase in right ventricular wall thickness. Right ventricular systolic function is normal. Left Atrium: Left atrial size was normal in size. Right Atrium: Right atrial size was normal in size. Pericardium: Trivial pericardial effusion is present. Mitral Valve: The mitral valve is grossly normal. Mild mitral annular calcification. No evidence of mitral valve regurgitation. No evidence of mitral valve stenosis. Tricuspid Valve: The tricuspid valve is not well visualized.  Aortic Valve: The aortic valve is calcified. Aortic valve regurgitation is mild. Mild to moderate aortic valve sclerosis/calcification is present, without any evidence of aortic stenosis. Pulmonic Valve: The pulmonic valve was not well visualized. Aorta: The aortic root and ascending aorta are structurally normal, with no evidence of dilitation. IAS/Shunts: The atrial septum is grossly normal.  LEFT VENTRICLE PLAX 2D LVIDd:         3.60 cm LVIDs:         2.60 cm LV PW:         1.00 cm LV IVS:        1.40 cm LVOT diam:     1.90 cm LV SV:         51 LV SV Index:   38 LVOT Area:     2.84 cm  LEFT ATRIUM           Index LA diam:      3.50 cm 2.60 cm/m LA Vol (A4C): 27.1 ml 20.14 ml/m  AORTIC VALVE LVOT Vmax:   106.00 cm/s LVOT Vmean:  67.900 cm/s LVOT VTI:    0.180 m  AORTA Ao Root diam: 2.90 cm  SHUNTS Systemic VTI:  0.18 m Systemic Diam: 1.90 cm Lennie Odor MD Electronically signed by Lennie Odor MD Signature Date/Time: 09/16/2020/4:38:43 PM    Final       Johni Narine T. [facility]  If 7PM-7AM, please contact night-coverage [email or web address] 09/17/2020, 1:59 PM

## 2020-09-18 ENCOUNTER — Inpatient Hospital Stay (HOSPITAL_COMMUNITY): Payer: Medicare Other

## 2020-09-18 DIAGNOSIS — R627 Adult failure to thrive: Secondary | ICD-10-CM | POA: Diagnosis not present

## 2020-09-18 DIAGNOSIS — E43 Unspecified severe protein-calorie malnutrition: Secondary | ICD-10-CM | POA: Insufficient documentation

## 2020-09-18 DIAGNOSIS — E876 Hypokalemia: Secondary | ICD-10-CM | POA: Diagnosis not present

## 2020-09-18 DIAGNOSIS — R918 Other nonspecific abnormal finding of lung field: Secondary | ICD-10-CM | POA: Diagnosis not present

## 2020-09-18 DIAGNOSIS — I6340 Cerebral infarction due to embolism of unspecified cerebral artery: Secondary | ICD-10-CM | POA: Diagnosis not present

## 2020-09-18 DIAGNOSIS — Z720 Tobacco use: Secondary | ICD-10-CM | POA: Diagnosis not present

## 2020-09-18 LAB — RENAL FUNCTION PANEL
Albumin: 2.4 g/dL — ABNORMAL LOW (ref 3.5–5.0)
Anion gap: 11 (ref 5–15)
BUN: 19 mg/dL (ref 8–23)
CO2: 25 mmol/L (ref 22–32)
Calcium: 8.3 mg/dL — ABNORMAL LOW (ref 8.9–10.3)
Chloride: 100 mmol/L (ref 98–111)
Creatinine, Ser: 1.17 mg/dL — ABNORMAL HIGH (ref 0.44–1.00)
GFR, Estimated: 47 mL/min — ABNORMAL LOW (ref >60)
Glucose, Bld: 118 mg/dL — ABNORMAL HIGH (ref 70–99)
Phosphorus: 1.7 mg/dL — ABNORMAL LOW (ref 2.5–4.6)
Potassium: 3.5 mmol/L (ref 3.5–5.1)
Sodium: 136 mmol/L (ref 135–145)

## 2020-09-18 LAB — CBC
HCT: 24.9 % — ABNORMAL LOW (ref 36.0–46.0)
Hemoglobin: 8.1 g/dL — ABNORMAL LOW (ref 12.0–15.0)
MCH: 25.9 pg — ABNORMAL LOW (ref 26.0–34.0)
MCHC: 32.5 g/dL (ref 30.0–36.0)
MCV: 79.6 fL — ABNORMAL LOW (ref 80.0–100.0)
Platelets: 82 10*3/uL — ABNORMAL LOW (ref 150–400)
RBC: 3.13 MIL/uL — ABNORMAL LOW (ref 3.87–5.11)
RDW: 16.8 % — ABNORMAL HIGH (ref 11.5–15.5)
WBC: 8.2 10*3/uL (ref 4.0–10.5)
nRBC: 0 % (ref 0.0–0.2)

## 2020-09-18 LAB — MAGNESIUM: Magnesium: 2.1 mg/dL (ref 1.7–2.4)

## 2020-09-18 MED ORDER — CLOPIDOGREL BISULFATE 75 MG PO TABS
75.0000 mg | ORAL_TABLET | Freq: Every day | ORAL | Status: DC
  Administered 2020-09-18 – 2020-09-19 (×2): 75 mg via ORAL
  Filled 2020-09-18 (×2): qty 1

## 2020-09-18 MED ORDER — POTASSIUM PHOSPHATES 15 MMOLE/5ML IV SOLN
30.0000 mmol | Freq: Once | INTRAVENOUS | Status: AC
  Administered 2020-09-18: 30 mmol via INTRAVENOUS
  Filled 2020-09-18: qty 10

## 2020-09-18 NOTE — Progress Notes (Signed)
80 y.o. female inpatient. Smoker. History of HTN, PVD, depression. Presented to the ED at [facility] with SOB,  Nausea, vomiting, generalized weakness and unintentional weight loss X 2 week.  She was admitted for further evaluation of AKI and passive suicidal ideations. She was found to have cortical and subcortical infarcts  During MRI for generalized weakness. CT chest from 5.25.22 reads Newly seen 1 cm mass in the lateral left upper lobe, axial images 61 and 62. This was not present previously and is quite concerning for pulmonary malignancy. Consider one of the following in 3 months for both low-risk and high-risk individuals: (a) repeat chest CT, (b) follow-up PET-CT, or (c) tissue sampling. This recommendation follows the consensus statement: Guidelines for Management of Incidental Pulmonary Nodules Detected on CT Images: From the Fleischner Society 2017; Radiology 2017; 284:228-243. Hgb 8.1, Cr 1.17. Patient is on Plavix daily. All other labs and medications are within acceptable parameters. NKDA.  After review of procedure request by IR Attending Dr. Odis Luster. Do the location of the nodule this would be an extremely high risk biopsy. Recommend the patient be discharged and have a PET performed as an outpatient. This was communicated to the Team.

## 2020-09-18 NOTE — Plan of Care (Signed)
  Problem: Education: Goal: Knowledge of General Education information will improve Description: Including pain rating scale, medication(s)/side effects and non-pharmacologic comfort measures Outcome: Progressing   Problem: Health Behavior/Discharge Planning: Goal: Ability to manage health-related needs will improve Outcome: Progressing   Problem: Clinical Measurements: Goal: Ability to maintain clinical measurements within normal limits will improve Outcome: Progressing Goal: Will remain free from infection Outcome: Progressing Goal: Diagnostic test results will improve Outcome: Progressing Goal: Respiratory complications will improve Outcome: Progressing Goal: Cardiovascular complication will be avoided Outcome: Progressing   Problem: Activity: Goal: Risk for activity intolerance will decrease Outcome: Progressing   Problem: Nutrition: Goal: Adequate nutrition will be maintained Outcome: Progressing   Problem: Coping: Goal: Level of anxiety will decrease Outcome: Progressing   Problem: Elimination: Goal: Will not experience complications related to bowel motility Outcome: Progressing Goal: Will not experience complications related to urinary retention Outcome: Progressing   Problem: Pain Managment: Goal: General experience of comfort will improve Outcome: Progressing   Problem: Safety: Goal: Ability to remain free from injury will improve Outcome: Progressing   Problem: Skin Integrity: Goal: Risk for impaired skin integrity will decrease Outcome: Progressing   Problem: Education: Goal: Knowledge of disease or condition will improve Outcome: Progressing Goal: Knowledge of secondary prevention will improve Outcome: Progressing Goal: Knowledge of patient specific risk factors addressed and post discharge goals established will improve Outcome: Progressing Goal: Individualized Educational Video(s) Outcome: Progressing   Problem: Coping: Goal: Will verbalize  positive feelings about self Outcome: Progressing Goal: Will identify appropriate support needs Outcome: Progressing   Problem: Health Behavior/Discharge Planning: Goal: Ability to manage health-related needs will improve Outcome: Progressing   Problem: Ischemic Stroke/TIA Tissue Perfusion: Goal: Complications of ischemic stroke/TIA will be minimized Outcome: Progressing   

## 2020-09-18 NOTE — TOC Initial Note (Signed)
Transition of Care [facility]) - Initial/Assessment Note    Patient Details  Name: Jennifer [name] MRN: [id number] Date of Birth: 04-04-1941  Transition of Care [facility]) CM/SW Contact:    Lawerance Sabal, RN Phone Number: 09/18/2020, 3:39 PM  Clinical Narrative:          Discussed w Dr Roda Shutters after he rounded. Sister at bedside who will discuss treatment plan and goals of care w brother [name] today. TOC available and will follow up with family tomorrow.            Barriers to Discharge: Continued Medical Work up   Patient Goals and CMS Choice        Expected Discharge Plan and Services                                                Prior Living Arrangements/Services                       Activities of Daily Living Home Assistive Devices/Equipment: Dentures (specify type),Walker (specify type),Wheelchair ADL Screening (condition at time of admission) Patient's cognitive ability adequate to safely complete daily activities?: No Is the patient deaf or have difficulty hearing?: No Does the patient have difficulty seeing, even when wearing glasses/contacts?: No Does the patient have difficulty concentrating, remembering, or making decisions?: Yes Patient able to express need for assistance with ADLs?: No Does the patient have difficulty dressing or bathing?: Yes Independently performs ADLs?: No Communication: Needs assistance Is this a change from baseline?: Pre-admission baseline Dressing (OT): Needs assistance Is this a change from baseline?: Pre-admission baseline Grooming: Needs assistance Is this a change from baseline?: Pre-admission baseline Feeding: Needs assistance Is this a change from baseline?: Pre-admission baseline Bathing: Needs assistance Is this a change from baseline?: Pre-admission baseline Toileting: Needs assistance Is this a change from baseline?: Pre-admission baseline In/Out Bed: Needs assistance Is this a change from baseline?:  Pre-admission baseline Walks in Home: Independent Does the patient have difficulty walking or climbing stairs?: Yes Weakness of Legs: Both Weakness of Arms/Hands: Both  Permission Sought/Granted                  Emotional Assessment              Admission diagnosis:  Dehydration [E86.0] Hypokalemia [E87.6] Failure to thrive in adult [R62.7] Mass of upper lobe of left lung [R91.8] Patient Active Problem List   Diagnosis Date Noted  . Protein-calorie malnutrition, severe 09/18/2020  . Cerebral embolism with cerebral infarction 09/16/2020  . Failure to thrive in adult 09/15/2020  . Weight loss 09/07/2020  . SOB (shortness of breath) on exertion 09/07/2020  . Gait abnormality 09/07/2020  . Dizziness after extension of neck 09/07/2020  . Chronic low back pain 07/05/2020  . Aortic atherosclerosis (HCC) 06/15/2020  . Malnutrition (HCC) 09/03/2019  . Sacral insufficiency fracture 08/04/2019  . Vitamin D deficiency 08/04/2019  . Closed compression fracture of L1 vertebra (HCC) 08/01/2019  . Back pain 07/05/2019  . Lumbar radiculopathy 06/27/2019  . Lung nodule 06/10/2019  . Abdominal aortic aneurysm (AAA) (HCC) 06/10/2019  . Chest pain, musculoskeletal 06/10/2019  . Ascending aortic aneurysm (HCC) 06/10/2019  . Hiatal hernia, large 01/09/2018  . GERD (gastroesophageal reflux disease) 12/08/2016  . History of Clostridium difficile infection 11/29/2016  . Prediabetes 11/02/2016  . Carotid arterial disease (HCC)  10/20/2015  . Anxiety state   . PAD (peripheral artery disease) (HCC) 10/10/2011  . Osteoporosis, senile 05/17/2011  . Dyslipidemia 05/03/2009  . Tobacco abuse 04/14/2009  . Essential hypertension 04/14/2009   PCP:  Pincus Sanes, MD Pharmacy:   [facility] [phone] [place], [place] - [phone] AT [facility] [address] [place] [address] Phone: [phone] Fax: [phone]     Social Determinants of  Health (SDOH) Interventions    Readmission Risk Interventions No flowsheet data found.

## 2020-09-18 NOTE — Progress Notes (Addendum)
Scan of the brain shows multiple small embolic infarctsSTROKE TEAM PROGRESS NOTE   INTERVAL HISTORY No acute events.  Patient presented with genearalized weakness and numerous falls and failure to thrive. There is concern for lung carcinoma on her chest CT.   Today Mrs. Mellor reports she is not feeling very well but is unable to provide any details. We discussed her stroke diagnosis and risk factor control. She states  "It is a very dubious chance that I will quit smoking." She was not attentive or able to focus on our discussion. She was able name only 2 animals in one minute. Recalls 0/3 words. Names 2/3 objects. She reports she lives alone.    Vitals:   09/17/20 1935 09/18/20 0003 09/18/20 0411 09/18/20 0730  BP: 117/65 120/68 (!) 161/87 124/68  Pulse: 77 74 77 69  Resp: 17 18 18 17   Temp: 98 F (36.7 C) 97.6 F (36.4 C) 98.4 F (36.9 C) (!) 97.5 F (36.4 C)  TempSrc: Oral Oral Oral Oral  SpO2: 98% 95% 98% 96%  Weight:       CBC:  Recent Labs  Lab 09/17/20 0355 09/18/20 0129  WBC 8.7 8.2  HGB 9.0* 8.1*  HCT 27.6* 24.9*  MCV 78.6* 79.6*  PLT 89* 82*   Basic Metabolic Panel:  Recent Labs  Lab 09/17/20 0355 09/18/20 0129  NA 136 136  K 2.8* 3.5  CL 104 100  CO2 22 25  GLUCOSE 93 118*  BUN 15 19  CREATININE 0.99 1.17*  CALCIUM 8.1* 8.3*  MG 1.6* 2.1  PHOS 2.4* 1.7*   Lipid Panel:  Recent Labs  Lab 09/17/20 0355  CHOL 142  TRIG 112  HDL 31*  CHOLHDL 4.6  VLDL 22  LDLCALC 89   HgbA1c:  Recent Labs  Lab 09/17/20 0355  HGBA1C 5.7*   Urine Drug Screen: No results for input(s): LABOPIA, COCAINSCRNUR, LABBENZ, AMPHETMU, THCU, LABBARB in the last 168 hours.  Alcohol Level No results for input(s): ETH in the last 168 hours.  IMAGING past 24 hours VAS 09/19/20 CAROTID  Result Date: 09/17/2020 Carotid Arterial Duplex Study Patient Name:  MAKAYLEE SPIELBERG  Date of Exam:   09/17/2020 Medical Rec #: 09/19/2020         Accession #:    [id number] Date of Birth:  1940/07/15         Patient Gender: F Patient Age:   80Y Exam Location:  [facility] Procedure:      VAS [facility] CAROTID Referring Phys: [id number] MCNEILL P KIRKPATRICK --------------------------------------------------------------------------------  Indications:       CVA. Risk Factors:      Hypertension, hyperlipidemia, current smoker, PAD. Other Factors:     Prediabetic. Comparison Study:  12-17-2019 Prior carotid duplex bilateral showed 1-39%                    stenosis of the RT ICA and 40-59% stenosis of the LT ICA. Performing Technologist: 12-19-2019 RDMS,RVT  Examination Guidelines: A complete evaluation includes B-mode imaging, spectral Doppler, color Doppler, and power Doppler as needed of all accessible portions of each vessel. Bilateral testing is considered an integral part of a complete examination. Limited examinations for reoccurring indications may be performed as noted.  Right Carotid Findings: +----------+--------+--------+--------+-------------------------+--------+           PSV cm/sEDV cm/sStenosisPlaque Description       Comments +----------+--------+--------+--------+-------------------------+--------+ CCA Prox  84      12  calcific                          +----------+--------+--------+--------+-------------------------+--------+ CCA Mid   90      15              calcific                          +----------+--------+--------+--------+-------------------------+--------+ CCA Distal57      12              heterogenous and calcific         +----------+--------+--------+--------+-------------------------+--------+ ICA Prox  60      17      1-39%   heterogenous and calcific         +----------+--------+--------+--------+-------------------------+--------+ ICA Distal47      16                                       tortuous +----------+--------+--------+--------+-------------------------+--------+ ECA       70                                                         +----------+--------+--------+--------+-------------------------+--------+ +----------+--------+-------+----------------+-------------------+           PSV cm/sEDV cmsDescribe        Arm Pressure (mmHG) +----------+--------+-------+----------------+-------------------+ [id number]            Multiphasic, WNL                    +----------+--------+-------+----------------+-------------------+ +---------+--------+--+--------+-+---------+ VertebralPSV cm/s48EDV cm/s6Antegrade +---------+--------+--+--------+-+---------+  Left Carotid Findings: +----------+--------+--------+--------+-------------------------+--------+           PSV cm/sEDV cm/sStenosisPlaque Description       Comments +----------+--------+--------+--------+-------------------------+--------+ CCA Prox  63      12              calcific and heterogenous         +----------+--------+--------+--------+-------------------------+--------+ CCA Mid   49      10              calcific                          +----------+--------+--------+--------+-------------------------+--------+ ICA Prox  257     52      40-59%  calcific and heterogenous         +----------+--------+--------+--------+-------------------------+--------+ ICA Mid   68      17                                                +----------+--------+--------+--------+-------------------------+--------+ ICA Distal51      15                                       tortuous +----------+--------+--------+--------+-------------------------+--------+ ECA       49                                                        +----------+--------+--------+--------+-------------------------+--------+ +----------+--------+--------+----------------+-------------------+  PSV cm/sEDV cm/sDescribe        Arm Pressure (mmHG) +----------+--------+--------+----------------+-------------------+ [id number]              Multiphasic, WNL                    +----------+--------+--------+----------------+-------------------+ +---------+--------+--+--------+--+---------+ VertebralPSV cm/s46EDV cm/s12Antegrade +---------+--------+--+--------+--+---------+   Summary: Right Carotid: Velocities in the right ICA are consistent with a 1-39% stenosis.                Non-hemodynamically significant plaque <50% noted in the CCA. Left Carotid: Velocities in the left ICA are consistent with a 40-59% stenosis.               Non-hemodynamically significant plaque <50% noted in the CCA. Vertebrals:  Bilateral vertebral arteries demonstrate antegrade flow. Subclavians: Normal flow hemodynamics were seen in bilateral subclavian              arteries. *See table(s) above for measurements and observations.  Electronically signed by Sherald Hess MD on 09/17/2020 at 4:31:07 PM.    Final    PHYSICAL EXAM Constitutional: Frail petite malnourished looking elderly Caucasian lady not in distress.  Psych: Affect appropriate to situation, calm and cooperative  HENT: No oropharyngeal obstruction.  MSK: no joint deformities. Respiratory: Effort normal, non-labored breathing Skin: Warm dry and intact visible skin  Neuro: Mental Status: Patient is awake alert and interactive.  Speech is clear without dysarthria.  She remains with diminished attention. Names 2 animals in a minute. Names 2/3 objects. Recalls 0/3 words.  She follows simple 1 and some two-step commands.   Patient is awake, alert, oriented to person and place, but not to month, year, and situation. She is able to states season is spring.  Patient is unable to give a clear and coherent history.  No signs of aphasia  She extinguishes to double simultaneous stimulation, appears to have some left neglect She unable to name the president Cranial Nerves: II: She has a left hemianopia.  Pupils are equal, round, and reactive to light.    III,IV, VI: EOMI without ptosis or  diploplia.  V: Facial sensation is symmetric to temperature VII: Facial movement with mild flattening of left nasolabial fold VIII: hearing is intact to voice X: Unable to assess  XI: Shoulder shrug is symmetric. XII: tongue is midline without atrophy or fasciculations.   Motor: She does not cooperate with formal strength testing but she does move all extremities spontaneously.  But has spastic right hemiparesis with contracture of the right upper extremities  and moves right side less than the left  Sensory: Sensation is symmetric to light touch and temperature in the arms and legs    Cerebellar: Unable to assess   ASSESSMENT/PLAN  VAEDA WESTALL is a 80 y.o. female, an at least 1ppd smoker (62 pack year history) with prior history of left lobe mass (untreated), PVD, htn, HLD persenting with weakness and numerous falls occurring for the past several weeks, associated with dizziness, as well as poor appetite/anorexia with unexpected weight loss of 10lbs in one month. She presented outside the tPA window. Work up showed showed new 1cm mass at lateral left upper lobe found on chest CT as well as both acute and remote strokes. Admitted for ongoing stroke work up, failure to thrive, depression with passive suicidality and AKI.   Stroke - Multifocal ischemic strokes of both cerebral and cerebellar hemispheres, likely embolic shower, in the setting of possible lung cancer.  Code Stroke CT head  No acute abnormality.   CTA head & neck: No LVO, Widespread atherosclerosis with 65% stenosis of the proximal left ICA.  MRI: Multifocal ischemic infarcts.  Underlying moderately advanced cerebral atrophy with chronic microvascular ischemic disease, with multiple additional remote lacunar infarcts about the bilateral thalami and pons. Multiple chronic micro hemorrhages   Carotid Doppler pending  2D Echo (poor study):  EF 60-65%. No shunt, wall motion abnormality or thrombus seen but study was  limited.   Carotid doppler study: non-hemodynamically significant plaque less than 50% bilaterally  LE doppler study is pending  LDL 89  HgbA1c 6.0  VTE prophylaxis - SCDs  On ASA 81mg  prior to admission, now on DAPT x 3 weeks then aspirin alone if deemed safe in setting of thrombocytopenia. Further treatment plan pending family discussion  Therapy recommendations:  SNF  Disposition:  TBD  Goals of Care: Palliative care following. Now DNR/DNI.   Thrombocytopenia   Plt 98->72->89->82  Close monitoring  Hypertension . Permissive hypertension (OK if < 220/120) but gradually normalize in 5-7 days . Long-term BP goal normotensive  Hyperlipidemia  Home meds: Lipitor 20mg  daily  LDL 89, goal < 70  On lipitor 40  Continue statin at discharge  Other Stroke Risk Factors  Advanced Age >/= 4865   Current Cigarette smoker, 31 pack year smoking history, advised to stop smoking.  Clotting disorder on Pletal  Other Active Problems  Malnutrition/Failure to thrive   Suspected new diagnosis of lung cancer: IR consulted for possible biopsy but have declined and recommended outpatient PET  [facility] Hospital day # 2  Delila A Bailey-Modzik, NP-C  ATTENDING NOTE: I reviewed above note and agree with the assessment and plan. Pt was seen and examined.   80 year old female with history of smoker, PVD, HTN, HLD, left upper lobe mass admitted for failure to thrive, falling, dizziness and weight loss.  CT chest found 1 cm of left upper lobe mass concerning for malignancy.  CTA head and neck left ICA 65% stenosis.  Carotid Doppler left ICA 40 to 59% stenosis.  EF 60 to 65%.  MRI showed embolic shower involving anterior and posterior bilateral hemisphere.  LE venous Doppler pending.  A1c 5.7, LDL 89.  Creatinine 0.99-> 1.17.  Hemoglobin 9.0-> 8.1.  Platelet 89-> 82.  On exam, sister at bedside, patient sleepy but arousable with voice, orientated to place, time and people, but not to age. No  aphasia, paucity of speech, following all simple commands. Able to name and repeat. No gaze palsy, tracking bilaterally, visual field full, PERRL. Mild left nasolabial fold flattening. Tongue midline. Bilateral UEs 4/5 proximal, no drift, however, left hand grip 3+/5 weaker than right 4/5. Bilaterally LEs 3/5 proximal and 4/5 distal. Sensation symmetrical bilaterally, FTN ataxia on the right, dysmetria on the left, gait not tested.   Etiology for patient stroke concerning for embolic source from hypercoagulable state in the setting of advanced malignancy.  Currently she is on aspirin 81 and Plavix 75 DAPT as well as Lipitor 40.  Further treatment plan pending family discussion given patient failure to thrive not able to tolerating cancer treatment even cancer confirmed. If aggressive care, may consider anticoagulation and MRI brain with contrast.   For detailed assessment and plan, please refer to above as I have made changes wherever appropriate.   Marvel PlanJindong Renell Allum, MD PhD Stroke Neurology 09/18/2020 7:20 PM     To contact Stroke Continuity provider, please refer to [email or web address]. After hours, contact General Neurology

## 2020-09-18 NOTE — Progress Notes (Signed)
PROGRESS NOTE  Jennifer BackersSusan L [name] QMV:[id number] DOB: November 14, 1940   PCP: Pincus SanesBurns, Stacy J, MD  Patient is from: Home.  Lives alone.  DOA: 09/15/2020 LOS: 2  Chief complaints: Generalized weakness  Brief Narrative / Interim history: 80 year old F with PMH of PVD, HTN, HLD, tobacco abuse and depression presenting with generalized weakness, intermittent nausea and vomiting, poor appetite and unintentional weight loss for about 2 weeks, and admitted for FTT, depression with passive suicidally and AKI.  Patient was recently started on Lexapro.  MRI brain showed multifocal infarcts including cortical and subcortical areas of both cerebral and cerebellar hemispheres.  Patient is already outside tPA window.  Neurology consulted the next morning.  TTE without significant finding.  Carotid US with 40 to 59% left ICA stenosis.  Neurology recommended DAPT with Plavix and ASA for 3 weeks followed by aspirin alone.  Palliative medicine following.  Therapy recommended SNF.  Subjective: Seen and examined earlier this morning.  No major events overnight of this morning.  No complaints.  She reports feeling better today.  She says her energy and mood is better today.  Denies headache, vision change, chest pain, shortness of breath, GI or UTI symptoms.  Objective: Vitals:   09/18/20 0003 09/18/20 0411 09/18/20 0730 09/18/20 1131  BP: 120/68 (!) 161/87 124/68 (!) 146/78  Pulse: 74 77 69 71  Resp: 18 18 17 17   Temp: 97.6 F (36.4 C) 98.4 F (36.9 C) (!) 97.5 F (36.4 C) 98 F (36.7 C)  TempSrc: Oral Oral Oral Oral  SpO2: 95% 98% 96% 97%  Weight:        Intake/Output Summary (Last 24 hours) at 09/18/2020 1242 Last data filed at 09/18/2020 1150 Gross per 24 hour  Intake 360 ml  Output 300 ml  Net 60 ml   Filed Weights   09/16/20 0017 09/17/20 0432  Weight: 37.6 kg 36.9 kg    Examination:  GENERAL: Frail looking elderly female. HEENT: MMM.  Vision and hearing grossly intact.  NECK: Supple.  No  apparent JVD.  RESP: On RA.  No IWOB.  Fair aeration bilaterally. CVS:  RRR. Heart sounds normal.  ABD/GI/GU: BS+. Abd soft, NTND.  MSK/EXT:  Moves extremities.  Significant muscle mass and subcu fat loss. SKIN: no apparent skin lesion or wound NEURO: Awake and alert. Oriented appropriately.  No apparent focal neuro deficit. PSYCH: Calm. Normal bright affect today.  Procedures:  None  Microbiology summarized: COVID-19 and influenza PCR nonreactive.  Assessment & Plan: Multifocal bilateral cerebral and cerebellar stroke-noted on MRI brain.  No focal neurodeficit but generalized weakness.  Pattern suggestive for embolic CVA.  She is already outside tPA window.  TTE without significant finding.  Carotid US with 40 to 59% left ICA stenosis. -Neurology recommended DAPT with Plavix and aspirin for 3 weeks followed by aspirin alone -Started Plavix today.  Continue aspirin and statin -Therapy recommended SNF.   Failure to thrive in adult/history of anorexia nervosa- seems to be psychogenic but CVA could be contributing as well.  She also have new 1 cm LUL nodule which is concerning given her smoking history.  Presents with poor appetite, unintentional weight loss, depressed mood, passive suicidal ideation and intermittent nausea and vomiting.  She has significant muscle mass and subcu fat loss on exam.  Also some concern about cognitive decline.  MRI brain with multifocal stroke which might be contributing.  TSH and CK within normal. -Liberated diet-upright for meals given large hiatal hernia -Agree with Marinol for appetite -Calorie count  underway 5/27>> -Appreciate input by psychiatry, neurology, palliative medicine and dietitian  Major depression with passive suicidal ideation: Feels better today.  Seems to have bright affect today. -Appreciate input by psychiatry  -Stopped Lexapro out of concern for nausea and vomiting  -Continue low-dose Seroquel -Psychiatry signed off.  AKI/azotemia:  Cr slightly up after initial improvement. Recent Labs    12/09/19 1433 07/05/20 1436 09/07/20 1504 09/15/20 1109 09/16/20 0333 09/17/20 0355 09/18/20 0129  BUN 22 14 13  33* 26* 15 19  CREATININE 0.89 0.97 1.01 1.47* 1.12* 0.99 1.17*  -Continue monitoring  Hypokalemia/hypomagnesemia/hypophosphatemia: K3.5.  Phosphorus 1.7.  Hypomagnesemia resolved. -IV potassium phosphate 30 mmol x 1  Essential hypertension: Normotensive for most part. -Discontinue IV fluid -Continue holding home amlodipine  LUL nodule concerning for malignancy given patient's heavy smoking history-about 1 cm LUL nodule seen on CT chest.  -IR consult for image guided biopsy -MRI brain W WO contrast  Ascending aortic aneurysm-about 4.4 cm -Annual imaging recommended  Emphysema/pulmonary scarring-likely from heavy smoking. -No respiratory symptoms  HLD -Continue Lipitor  PVD -Continue ASA -Now on Plavix  Tobacco use disorder -Encourage cessation -Nicotine patch  Thrombocytopenia: Relatively stable.  Likely due to malnutrition Recent Labs  Lab 09/15/20 1057 09/16/20 0333 09/17/20 0355 09/18/20 0129  PLT 98* 72* 89* 82*  -Continue monitoring  Goal of care-significant comorbidities as above.  Guarded prognosis but early to tell her response to treatment.  DNR/DNI appropriate.  Palliative medicine following.  Severe malnutrition/FTT-decreased appetite with unintentional weight loss Body mass index is 13.96 kg/m. Nutrition Problem: Severe Malnutrition Etiology: chronic illness (anorexia nervosa) Signs/Symptoms: energy intake < or equal to 75% for > or equal to 1 month,severe fat depletion,severe muscle depletion,percent weight loss Percent weight loss: 11 % (over 6 months) Interventions: Ensure Enlive (each supplement provides 350kcal and 20 grams of protein),Magic cup,Prostat,MVI,Calorie Count,Education   DVT prophylaxis:  SCD for VT prophylaxis  Code Status: DNR/DNI Family Communication:  Updated patient's sister over the phone  Level of care: Telemetry Medical  Status is: Inpatient  Remains inpatient appropriate because:Persistent severe electrolyte disturbances, Ongoing diagnostic testing needed not appropriate for outpatient work up, Unsafe d/c plan, IV treatments appropriate due to intensity of illness or inability to take PO and Inpatient level of care appropriate due to severity of illness   Dispo: The patient is from: Home              Anticipated d/c is to: SNF or residential hospice based on clinical progress              Patient currently is not medically stable to d/c.   Difficult to place patient No          Consultants:  Neurology Palliative medicine Psychiatry-signed off   Sch Meds:  Scheduled Meds: . (feeding supplement) PROSource Plus  30 mL Oral BID BM  . aspirin EC  81 mg Oral QAC breakfast  . atorvastatin  40 mg Oral Daily  . clopidogrel  75 mg Oral Daily  . docusate sodium  100 mg Oral BID  . dronabinol  2.5 mg Oral BID AC  . feeding supplement  237 mL Oral TID BM  . LORazepam  0.5 mg Intravenous Once  . multivitamin with minerals  1 tablet Oral Daily  . pantoprazole  40 mg Oral Daily  . QUEtiapine  25 mg Oral QHS  . sodium chloride flush  3 mL Intravenous Q12H   Continuous Infusions:  PRN Meds:.acetaminophen **OR** acetaminophen, albuterol, bisacodyl, HYDROcodone-acetaminophen, morphine  injection, nicotine, ondansetron **OR** ondansetron (ZOFRAN) IV, polyethylene glycol  Antimicrobials: Anti-infectives (From admission, onward)   None       I have personally reviewed the following labs and images: CBC: Recent Labs  Lab 09/15/20 1057 09/16/20 0333 09/17/20 0355 09/18/20 0129  WBC 9.6 7.4 8.7 8.2  HGB 11.9* 9.0* 9.0* 8.1*  HCT 37.5 27.0* 27.6* 24.9*  MCV 81.0 78.5* 78.6* 79.6*  PLT 98* 72* 89* 82*   BMP &GFR Recent Labs  Lab 09/15/20 1109 09/16/20 0333 09/17/20 0355 09/18/20 0129  NA 135 138 136 136  K 3.1*  2.7* 2.8* 3.5  CL 101 107 104 100  CO2 23 22 22 25   GLUCOSE 101* 93 93 118*  BUN 33* 26* 15 19  CREATININE 1.47* 1.12* 0.99 1.17*  CALCIUM 9.2 8.2* 8.1* 8.3*  MG 2.1 1.8 1.6* 2.1  PHOS  --   --  2.4* 1.7*   Estimated Creatinine Clearance: 21.6 mL/min (A) (by C-G formula based on SCr of 1.17 mg/dL (H)). Liver & Pancreas: Recent Labs  Lab 09/15/20 1109 09/17/20 0355 09/18/20 0129  AST 24  --   --   ALT 8  --   --   ALKPHOS 71  --   --   BILITOT 0.9  --   --   PROT 7.7  --   --   ALBUMIN 3.4* 2.7* 2.4*   No results for input(s): LIPASE, AMYLASE in the last 168 hours. No results for input(s): AMMONIA in the last 168 hours. Diabetic: Recent Labs    09/17/20 0355  HGBA1C 5.7*   Recent Labs  Lab 09/15/20 1617 09/16/20 2202 09/17/20 0106 09/17/20 0428  GLUCAP 95 71 87 91   Cardiac Enzymes: Recent Labs  Lab 09/16/20 0333 09/17/20 0355  CKTOTAL 111 133   No results for input(s): PROBNP in the last 8760 hours. Coagulation Profile: No results for input(s): INR, PROTIME in the last 168 hours. Thyroid Function Tests: No results for input(s): TSH, T4TOTAL, FREET4, T3FREE, THYROIDAB in the last 72 hours. Lipid Profile: Recent Labs    09/17/20 0355  CHOL 142  HDL 31*  LDLCALC 89  TRIG 09/19/20  CHOLHDL 4.6   Anemia Panel: No results for input(s): VITAMINB12, FOLATE, FERRITIN, TIBC, IRON, RETICCTPCT in the last 72 hours. Urine analysis:    Component Value Date/Time   COLORURINE YELLOW 09/16/2020 1522   APPEARANCEUR HAZY (A) 09/16/2020 1522   LABSPEC 1.018 09/16/2020 1522   PHURINE 7.0 09/16/2020 1522   GLUCOSEU NEGATIVE 09/16/2020 1522   GLUCOSEU NEGATIVE 04/21/2015 1135   HGBUR LARGE (A) 09/16/2020 1522   BILIRUBINUR NEGATIVE 09/16/2020 1522   KETONESUR NEGATIVE 09/16/2020 1522   PROTEINUR NEGATIVE 09/16/2020 1522   UROBILINOGEN 0.2 04/21/2015 1135   NITRITE NEGATIVE 09/16/2020 1522   LEUKOCYTESUR TRACE (A) 09/16/2020 1522   Sepsis Labs: Invalid input(s):  PROCALCITONIN, LACTICIDVEN  Microbiology: Recent Results (from the past 240 hour(s))  Resp Panel by RT-PCR (Flu A&B, Covid) Nasopharyngeal Swab     Status: None   Collection Time: 09/15/20  1:07 PM   Specimen: Nasopharyngeal Swab; Nasopharyngeal(NP) swabs in vial transport medium  Result Value Ref Range Status   SARS Coronavirus 2 by RT PCR NEGATIVE NEGATIVE Final    Comment: (NOTE) SARS-CoV-2 target nucleic acids are NOT DETECTED.  The SARS-CoV-2 RNA is generally detectable in upper respiratory specimens during the acute phase of infection. The lowest concentration of SARS-CoV-2 viral copies this assay can detect is 138 copies/mL. A negative result does not  preclude SARS-Cov-2 infection and should not be used as the sole basis for treatment or other patient management decisions. A negative result may occur with  improper specimen collection/handling, submission of specimen other than nasopharyngeal swab, presence of viral mutation(s) within the areas targeted by this assay, and inadequate number of viral copies(<138 copies/mL). A negative result must be combined with clinical observations, patient history, and epidemiological information. The expected result is Negative.  Fact Sheet for Patients:  BloggerCourse.com  Fact Sheet for Healthcare Providers:  SeriousBroker.it  This test is no t yet approved or cleared by the [place] FDA and  has been authorized for detection and/or diagnosis of SARS-CoV-2 by FDA under an Emergency Use Authorization (EUA). This EUA will remain  in effect (meaning this test can be used) for the duration of the COVID-19 declaration under Section 564(b)(1) of the Act, 21 U.S.C.section 360bbb-3(b)(1), unless the authorization is terminated  or revoked sooner.       Influenza A by PCR NEGATIVE NEGATIVE Final   Influenza B by PCR NEGATIVE NEGATIVE Final    Comment: (NOTE) The Xpert Xpress  SARS-CoV-2/FLU/RSV plus assay is intended as an aid in the diagnosis of influenza from Nasopharyngeal swab specimens and should not be used as a sole basis for treatment. Nasal washings and aspirates are unacceptable for Xpert Xpress SARS-CoV-2/FLU/RSV testing.  Fact Sheet for Patients: BloggerCourse.com  Fact Sheet for Healthcare Providers: SeriousBroker.it  This test is not yet approved or cleared by the [place] FDA and has been authorized for detection and/or diagnosis of SARS-CoV-2 by FDA under an Emergency Use Authorization (EUA). This EUA will remain in effect (meaning this test can be used) for the duration of the COVID-19 declaration under Section 564(b)(1) of the Act, 21 U.S.C. section 360bbb-3(b)(1), unless the authorization is terminated or revoked.  Performed at [facility], [address], [place], [place] [address]   Culture, Urine     Status: Abnormal (Preliminary result)   Collection Time: 09/16/20  3:22 PM   Specimen: Urine, Random  Result Value Ref Range Status   Specimen Description URINE, RANDOM  Final   Special Requests NONE  Final   Culture (A)  Final    >=100,000 COLONIES/mL GRAM NEGATIVE RODS SUSCEPTIBILITIES TO FOLLOW Performed at [facility], [address], [place], [place] [address]    Report Status PENDING  Incomplete    Radiology Studies: No results found.    Maven Rosander T. Kimla Furth [facility]  If 7PM-7AM, please contact night-coverage [email or web address] 09/18/2020, 12:42 PM

## 2020-09-18 NOTE — Evaluation (Addendum)
Speech Language Pathology Evaluation Patient Details Name: Jennifer [name] MRN: [id number] DOB: September 28, 1940 Today's Date: 09/18/2020 Time: 1100-1130 SLP Time Calculation (min) (ACUTE ONLY): 30 min  Problem List:  Patient Active Problem List   Diagnosis Date Noted  . Protein-calorie malnutrition, severe 09/18/2020  . Cerebral embolism with cerebral infarction 09/16/2020  . Failure to thrive in adult 09/15/2020  . Weight loss 09/07/2020  . SOB (shortness of breath) on exertion 09/07/2020  . Gait abnormality 09/07/2020  . Dizziness after extension of neck 09/07/2020  . Chronic low back pain 07/05/2020  . Aortic atherosclerosis (HCC) 06/15/2020  . Malnutrition (HCC) 09/03/2019  . Sacral insufficiency fracture 08/04/2019  . Vitamin D deficiency 08/04/2019  . Closed compression fracture of L1 vertebra (HCC) 08/01/2019  . Back pain 07/05/2019  . Lumbar radiculopathy 06/27/2019  . Lung nodule 06/10/2019  . Abdominal aortic aneurysm (AAA) (HCC) 06/10/2019  . Chest pain, musculoskeletal 06/10/2019  . Ascending aortic aneurysm (HCC) 06/10/2019  . Hiatal hernia, large 01/09/2018  . GERD (gastroesophageal reflux disease) 12/08/2016  . History of Clostridium difficile infection 11/29/2016  . Prediabetes 11/02/2016  . Carotid arterial disease (HCC) 10/20/2015  . Anxiety state   . PAD (peripheral artery disease) (HCC) 10/10/2011  . Osteoporosis, senile 05/17/2011  . Dyslipidemia 05/03/2009  . Tobacco abuse 04/14/2009  . Essential hypertension 04/14/2009   Past Medical History:  Past Medical History:  Diagnosis Date  . ANEMIA-NOS   . Cataract    right cataract removed  . Clotting disorder (HCC)    Pletal  . Depression    no longer taking meds/ resolved  . DYSLIPIDEMIA   . GERD (gastroesophageal reflux disease)   . Hemorrhoids   . HYPERTENSION   . Negative colorectal cancer screening using Cologuard test   . Osteoporosis, senile    DEXA 03/2014: -3.3 R fem  . Peripheral  vascular disease, unspecified (HCC) dx 02/2010 ABI  . SMOKER    Past Surgical History:  Past Surgical History:  Procedure Laterality Date  . CATARACT EXTRACTION W/ INTRAOCULAR LENS IMPLANT Right 03/12/14   groat  . HEMORRHOID SURGERY  05/18/2011   Procedure: HEMORRHOIDECTOMY;  Surgeon: Robyne Askew, MD;  Location: [facility];  Service: General;  Laterality: N/A;  hemorrhoidectomy, three columns, internal and external  . IR KYPHO LUMBAR INC FX REDUCE BONE BX UNI/BIL CANNULATION INC/IMAGING  07/11/2019  . TONSILLECTOMY  1946  . TUBAL LIGATION  1979  . UPPER GASTROINTESTINAL ENDOSCOPY  01/03/2018   HPI:  Pt is an 80 y.o. F who presents with weakess, frequent falls, and confusion. MRI:  multifocal infarcts including cortical and subcortical areas of both cerebral and cerebellar hemispheres. MRI also revealed remote lacunar infarcts in bilateral thalami and pons. Dx include FTT, major depression.  Significant PMH: HTN, osteoporosis, L1 compression fx s/p kyphoplasty.   Assessment / Plan / Recommendation Clinical Impression  SLUMS test initiated *limited by pt needing medications and needing cleaned.  Of the first seven question of the SLUMS, pt answered two correctly. She was oriented to year and current state of [place] - but not oriented to situation intially, nor able to conduct basic math question, recall 5 objects nor have intact verbal fluency.  Mild dysarthria apparent with decreased phonatory strength and imprecise articulation.  Pt presents with significant difficulty with sustained attention which impacts all tasks/participation - She frequently closed her eyes during the session requiring cues to open them.   Perseveration noted x2.  Pt reports awareness to attention and communication deficits.  Pt was oriented to self and year initially only - later in session recalled she wa informed she had several strokes. Pt raised her voice at end of session when SLP requested she self feed - stating loudly  "I'll do it" - ? frustration level impact on rehab.   She will benefit from SNF for rehab to address significant cognitive lingusitic deficits.  SLP will follow up for further administration of SLUMS during cognitive linguistic diagnostic treatement.    SLP Assessment  SLP Recommendation/Assessment: Patient needs continued Speech Lanaguage Pathology Services SLP Visit Diagnosis: Cognitive communication deficit (R41.841)    Follow Up Recommendations  Skilled Nursing facility    Frequency and Duration min 1 x/week  1 week      SLP Evaluation Cognition  Overall Cognitive Status: No family/caregiver present to determine baseline cognitive functioning Arousal/Alertness: Awake/alert Orientation Level: Oriented to person;Oriented to place;Disoriented to time;Disoriented to situation Attention: Focused;Sustained Focused Attention: Appears intact Sustained Attention: Impaired Memory: Impaired (0/5 recalled with cue, 2/5 with multiple choice, 1 of 5 with category) Memory Impairment: Storage deficit;Retrieval deficit;Decreased recall of new information;Decreased short term memory Awareness:  (verbalizes awareness of attention deficits) Problem Solving: Impaired Problem Solving Impairment: Functional basic (decreased ability to figure out to reposition when discomfort) Safety/Judgment: Impaired       Comprehension  Auditory Comprehension Overall Auditory Comprehension: Impaired Yes/No Questions: Not tested Commands: Impaired One Step Basic Commands: 75-100% accurate Two Step Basic Commands: 25-49% accurate Interfering Components: Attention EffectiveTechniques: Extra processing time Visual Recognition/Discrimination Discrimination: Not tested Reading Comprehension Reading Status:  (pt had difficulty reading calendar)    Expression     Oral / Motor  Oral Motor/Sensory Function Overall Oral Motor/Sensory Function: Other (comment) (of tasks pt would complete, generalized weakness  noted) Motor Speech Resonance: Within functional limits Articulation: Impaired Intelligibility: Intelligibility reduced Word: 75-100% accurate Phrase: 50-74% accurate Motor Planning: Witnin functional limits Motor Speech Errors: Not applicable Interfering Components: Inadequate dentition Effective Techniques: Slow rate   GO                    Chales Abrahams 09/18/2020, 11:54 AM Rolena Infante, MS [facility] SLP Acute Rehab Services Office [phone] Pager [phone]

## 2020-09-19 ENCOUNTER — Inpatient Hospital Stay (HOSPITAL_COMMUNITY): Payer: Medicare Other

## 2020-09-19 DIAGNOSIS — R627 Adult failure to thrive: Secondary | ICD-10-CM | POA: Diagnosis not present

## 2020-09-19 DIAGNOSIS — I6340 Cerebral infarction due to embolism of unspecified cerebral artery: Secondary | ICD-10-CM | POA: Diagnosis not present

## 2020-09-19 DIAGNOSIS — Z515 Encounter for palliative care: Secondary | ICD-10-CM | POA: Diagnosis not present

## 2020-09-19 DIAGNOSIS — R911 Solitary pulmonary nodule: Secondary | ICD-10-CM

## 2020-09-19 DIAGNOSIS — Z7189 Other specified counseling: Secondary | ICD-10-CM | POA: Diagnosis not present

## 2020-09-19 DIAGNOSIS — I639 Cerebral infarction, unspecified: Secondary | ICD-10-CM | POA: Diagnosis not present

## 2020-09-19 DIAGNOSIS — Z720 Tobacco use: Secondary | ICD-10-CM | POA: Diagnosis not present

## 2020-09-19 DIAGNOSIS — R918 Other nonspecific abnormal finding of lung field: Secondary | ICD-10-CM | POA: Diagnosis not present

## 2020-09-19 DIAGNOSIS — I10 Essential (primary) hypertension: Secondary | ICD-10-CM | POA: Diagnosis not present

## 2020-09-19 DIAGNOSIS — N179 Acute kidney failure, unspecified: Secondary | ICD-10-CM

## 2020-09-19 LAB — RENAL FUNCTION PANEL
Albumin: 2.5 g/dL — ABNORMAL LOW (ref 3.5–5.0)
Anion gap: 10 (ref 5–15)
BUN: 18 mg/dL (ref 8–23)
CO2: 24 mmol/L (ref 22–32)
Calcium: 7.7 mg/dL — ABNORMAL LOW (ref 8.9–10.3)
Chloride: 100 mmol/L (ref 98–111)
Creatinine, Ser: 0.92 mg/dL (ref 0.44–1.00)
GFR, Estimated: >60 mL/min (ref >60)
Glucose, Bld: 98 mg/dL (ref 70–99)
Phosphorus: 4.4 mg/dL (ref 2.5–4.6)
Potassium: 3.9 mmol/L (ref 3.5–5.1)
Sodium: 134 mmol/L — ABNORMAL LOW (ref 135–145)

## 2020-09-19 LAB — URINE CULTURE: Culture: >=100000 — AB

## 2020-09-19 LAB — CBC
HCT: 25.8 % — ABNORMAL LOW (ref 36.0–46.0)
Hemoglobin: 8.4 g/dL — ABNORMAL LOW (ref 12.0–15.0)
MCH: 25.8 pg — ABNORMAL LOW (ref 26.0–34.0)
MCHC: 32.6 g/dL (ref 30.0–36.0)
MCV: 79.4 fL — ABNORMAL LOW (ref 80.0–100.0)
Platelets: 89 10*3/uL — ABNORMAL LOW (ref 150–400)
RBC: 3.25 MIL/uL — ABNORMAL LOW (ref 3.87–5.11)
RDW: 16.8 % — ABNORMAL HIGH (ref 11.5–15.5)
WBC: 7.2 10*3/uL (ref 4.0–10.5)
nRBC: 0 % (ref 0.0–0.2)

## 2020-09-19 LAB — MAGNESIUM: Magnesium: 1.8 mg/dL (ref 1.7–2.4)

## 2020-09-19 IMAGING — MR MR HEAD W/ CM
4 series · 13 of 48 positions shown · IV contrast (Yes GAD)
Comparison: MRI [DATE].

CLINICAL DATA: Brain mass or lesion.

EXAM:
MRI HEAD WITH CONTRAST
TECHNIQUE: Multiplanar, multiecho pulse sequences of the brain and surrounding
structures were obtained with intravenous contrast.
CONTRAST:  4mL GADAVIST GADOBUTROL 1 MMOL/ML IV SOLN

[Series 2: ax 3(person_name) pre · axial · non-contrast · 3.0mm · 0.94mm/px · z∈[-75,+43]mm · 3 of 56 slices shown]
[im 8/56]
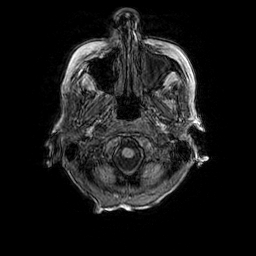
[im 30/56]
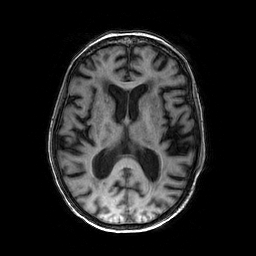
[im 48/56]
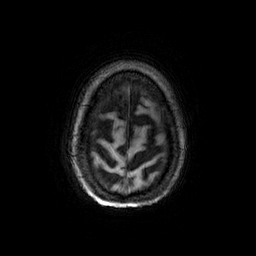

[Series 3: T2 post-contrast · coronal · 5.0mm · 0.20mm/px · 4 of 30 slices shown]
[im 1/30]
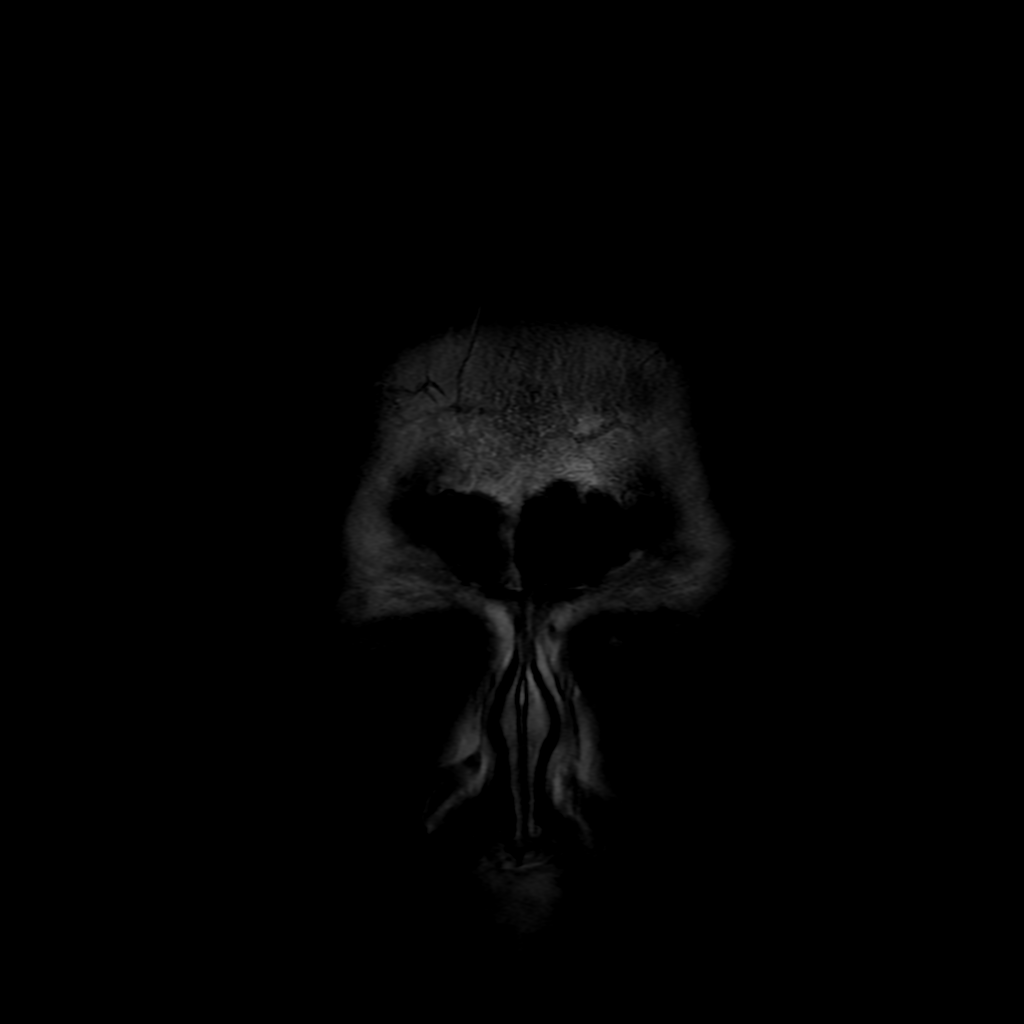
[im 5/30]
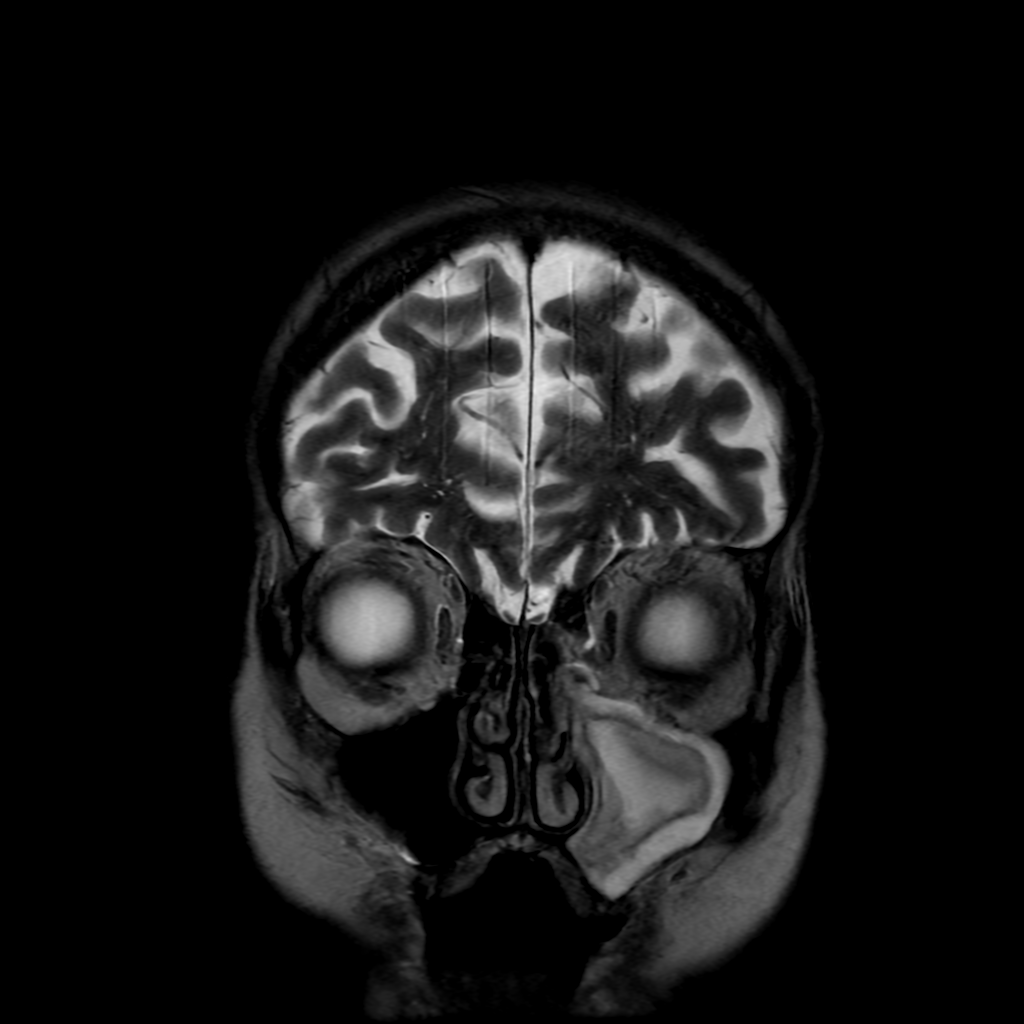
[im 17/30]
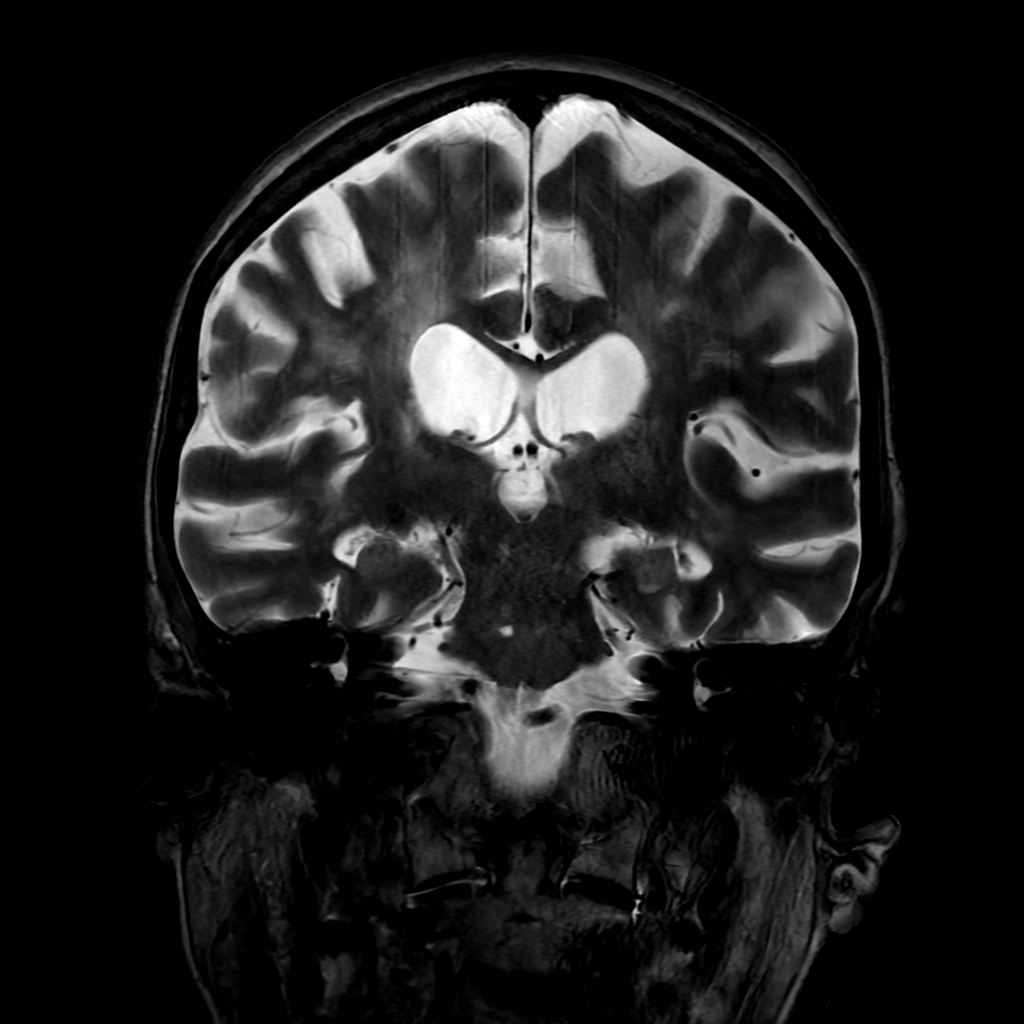
[im 25/30]
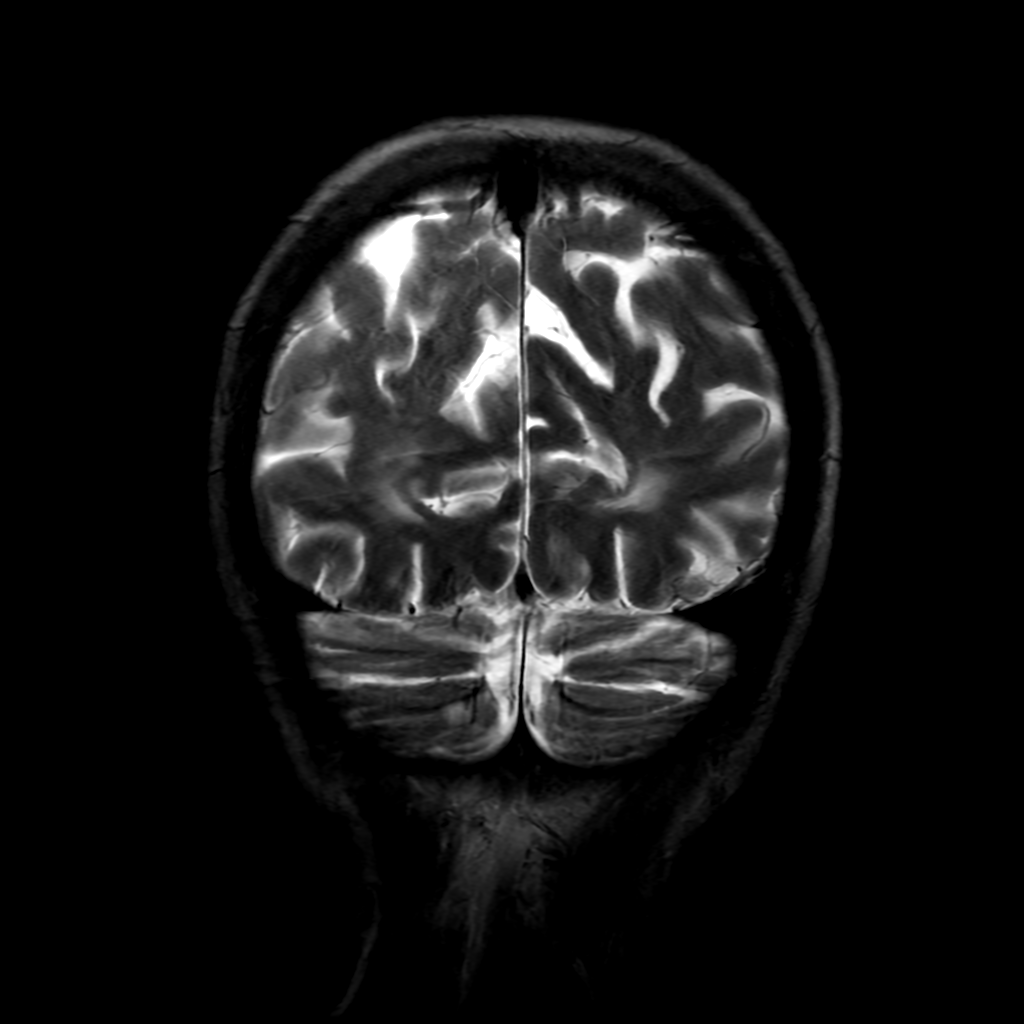

[Series 4: T1 · coronal · 5.0mm · 0.43mm/px · 3 of 30 slices shown (1 of 2)]
[im 5/30]
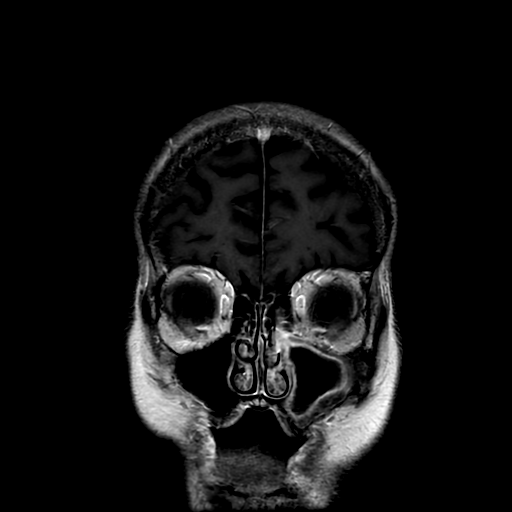
[im 17/30]
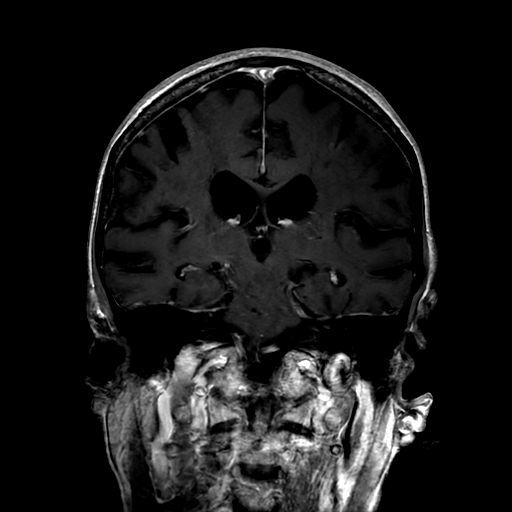
[im 25/30]
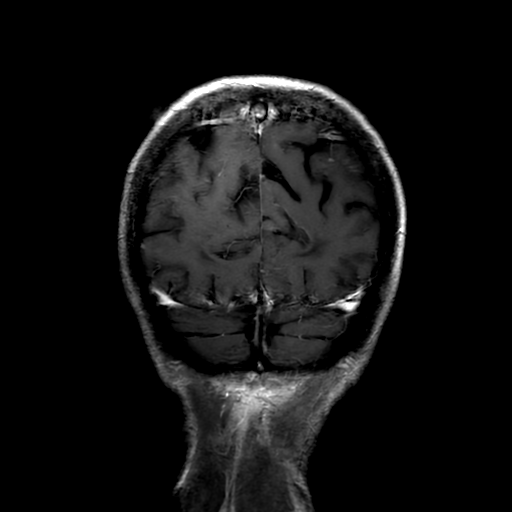

[Series 5: T1 · axial · 3.0mm · 0.94mm/px · z∈[-75,+43]mm · 3 of 56 slices shown (2 of 2)]
[im 8/56]
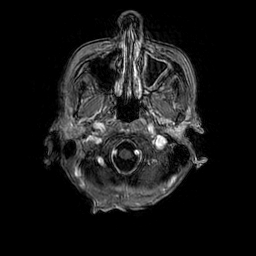
[im 30/56]
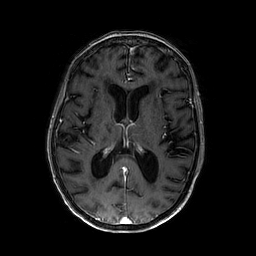
[im 48/56]
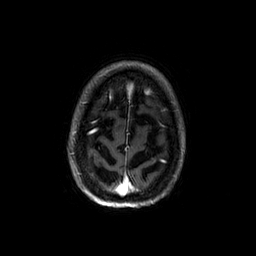

[13 of 48 positions shown; findings below may reference images not displayed]

FINDINGS: Postcontrast imaging performed due to left upper lobe mass seen on
recent CT chest. There is punctate enhancement associated with many
of the areas of restricted diffusion on recent MRI. For example,
there are enhancing lesions in the high bilateral frontal lobes in
the precentral gyrus (series 44, image 5). Additional area of
enhancing left cerebellum (series 5, image 15), right cerebellum
(series 5, image 10), and right anterior basal ganglia (series 5,
image 31).

Please see recent MRI for additional findings.
IMPRESSION: Postcontrast imaging performed due to left upper lobe mass seen on
recent CT chest. There is punctate multifocal enhancement at many of
the sites of restricted diffusion seen on recent MRI head. These
areas of enhancement could relate to enhancement of acute/subacute
infarcts or metastases. A follow-up MRI with contrast in
approximately 4 weeks is recommended to further evaluate.

## 2020-09-19 MED ORDER — GADOBUTROL 1 MMOL/ML IV SOLN
4.0000 mL | Freq: Once | INTRAVENOUS | Status: AC | PRN
  Administered 2020-09-19: 4 mL via INTRAVENOUS

## 2020-09-19 MED ORDER — SODIUM CHLORIDE 0.9 % IV SOLN
1.0000 g | INTRAVENOUS | Status: DC
  Administered 2020-09-19: 1 g via INTRAVENOUS
  Filled 2020-09-19: qty 10

## 2020-09-19 MED ORDER — APIXABAN 5 MG PO TABS: 5.0000 mg | ORAL_TABLET | Freq: Two times a day (BID) | ORAL | Status: DC

## 2020-09-19 MED ORDER — APIXABAN 2.5 MG PO TABS
2.5000 mg | ORAL_TABLET | Freq: Two times a day (BID) | ORAL | Status: DC
  Administered 2020-09-20: 2.5 mg via ORAL
  Filled 2020-09-19: qty 1

## 2020-09-19 MED ORDER — CEPHALEXIN 500 MG PO CAPS
500.0000 mg | ORAL_CAPSULE | Freq: Three times a day (TID) | ORAL | Status: AC
  Administered 2020-09-20 – 2020-09-23 (×12): 500 mg via ORAL
  Filled 2020-09-19 (×12): qty 1

## 2020-09-19 MED ORDER — APIXABAN 5 MG PO TABS
10.0000 mg | ORAL_TABLET | Freq: Two times a day (BID) | ORAL | Status: DC
  Administered 2020-09-19: 10 mg via ORAL
  Filled 2020-09-19: qty 2

## 2020-09-19 NOTE — Progress Notes (Signed)
BLE venous duplex has been completed.  Attempted to call floor for preliminary findings but no answer.  Melissa, RN messaged with preliminary findings.  Results can be found under chart review under CV PROC. 09/19/2020 10:17 AM Samary Shatz RVT, RDMS

## 2020-09-19 NOTE — TOC Initial Note (Signed)
Transition of Care [facility]) - Initial/Assessment Note    Patient Details  Name: Jennifer [name] MRN: [id number] Date of Birth: [birth year]  Transition of Care [facility]) CM/SW Contact:    Jennifer Sabal, RN Phone Number: 09/19/2020, 3:29 PM  Clinical Narrative:          Jennifer [name] w patient and her sister in law Jennifer [name] at bedside. Jennifer [name] is her brother Jennifer [name]. Patient states that Jennifer [name] is family point of contact.  Discussed discharge options. Patient is agreeable to SNF placement at dicahrge with palliative services. Palliative referral made to Wallis Bamberg at [facility]. Patient states that she has been to [facility] a little over a year ago, she does not want to go back there. She is agreeable to a general search for facilities in [facility]. CSW updated and will complete FL2 and initiate search in HUB.               Jennifer [name] Sister [phone]  [phone]  Jennifer [name] Brother   [phone]     Expected Discharge Plan: Skilled Nursing Facility Barriers to Discharge: Continued Medical Work up   Patient Goals and CMS Choice Patient states their goals for this hospitalization and ongoing recovery are:: to get stronger before going home      Expected Discharge Plan and Services Expected Discharge Plan: Skilled Nursing Facility In-house Referral: Clinical Social Work Discharge Planning Services: CM Consult   Living arrangements for the past 2 months: Single Family Home (town house)                                      Prior Living Arrangements/Services Living arrangements for the past 2 months: Single Family Home (town house) Lives with:: Self                   Activities of Daily Living Home Assistive Devices/Equipment: Dentures (specify type),Walker (specify type),Wheelchair ADL Screening (condition at time of admission) Patient's cognitive ability adequate to safely complete daily activities?: No Is the patient deaf or have difficulty  hearing?: No Does the patient have difficulty seeing, even when wearing glasses/contacts?: No Does the patient have difficulty concentrating, remembering, or making decisions?: Yes Patient able to express need for assistance with ADLs?: No Does the patient have difficulty dressing or bathing?: Yes Independently performs ADLs?: No Communication: Needs assistance Is this a change from baseline?: Pre-admission baseline Dressing (OT): Needs assistance Is this a change from baseline?: Pre-admission baseline Grooming: Needs assistance Is this a change from baseline?: Pre-admission baseline Feeding: Needs assistance Is this a change from baseline?: Pre-admission baseline Bathing: Needs assistance Is this a change from baseline?: Pre-admission baseline Toileting: Needs assistance Is this a change from baseline?: Pre-admission baseline In/Out Bed: Needs assistance Is this a change from baseline?: Pre-admission baseline Walks in Home: Independent Does the patient have difficulty walking or climbing stairs?: Yes Weakness of Legs: Both Weakness of Arms/Hands: Both  Permission Sought/Granted                  Emotional Assessment              Admission diagnosis:  Dehydration [E86.0] Hypokalemia [E87.6] Failure to thrive in adult [R62.7] Mass of upper lobe of left lung [R91.8] Patient Active Problem List   Diagnosis Date Noted  . Protein-calorie malnutrition, severe 09/18/2020  . Cerebral embolism with cerebral infarction 09/16/2020  . Failure to thrive in adult 09/15/2020  .  Weight loss 09/07/2020  . SOB (shortness of breath) on exertion 09/07/2020  . Gait abnormality 09/07/2020  . Dizziness after extension of neck 09/07/2020  . Chronic low back pain 07/05/2020  . Aortic atherosclerosis (HCC) 06/15/2020  . Malnutrition (HCC) 09/03/2019  . Sacral insufficiency fracture 08/04/2019  . Vitamin D deficiency 08/04/2019  . Closed compression fracture of L1 vertebra (HCC)  08/01/2019  . Back pain 07/05/2019  . Lumbar radiculopathy 06/27/2019  . Lung nodule 06/10/2019  . Abdominal aortic aneurysm (AAA) (HCC) 06/10/2019  . Chest pain, musculoskeletal 06/10/2019  . Ascending aortic aneurysm (HCC) 06/10/2019  . Hiatal hernia, large 01/09/2018  . GERD (gastroesophageal reflux disease) 12/08/2016  . History of Clostridium difficile infection 11/29/2016  . Prediabetes 11/02/2016  . Carotid arterial disease (HCC) 10/20/2015  . Anxiety state   . PAD (peripheral artery disease) (HCC) 10/10/2011  . Osteoporosis, senile 05/17/2011  . Dyslipidemia 05/03/2009  . Tobacco abuse 04/14/2009  . Essential hypertension 04/14/2009   PCP:  Pincus Sanes, MD Pharmacy:   [facility] [phone] [place], [place] - [phone] AT [facility] [address] [place] [place] [address] Phone: [phone] Fax: [phone]     Social Determinants of Health (SDOH) Interventions    Readmission Risk Interventions No flowsheet data found.

## 2020-09-19 NOTE — Progress Notes (Addendum)
ANTICOAGULATION CONSULT NOTE - Initial Consult  Pharmacy Consult for apixaban Indication: DVT  No Known Allergies  Patient Measurements: Weight: 40 kg (88 lb 2.9 oz)  Vital Signs: Temp: 98.7 F (37.1 C) (05/29 1146) Temp Source: Oral (05/29 1146) BP: 124/60 (05/29 1146) Pulse Rate: 72 (05/29 1146)  Labs: Recent Labs    09/17/20 0355 09/18/20 0129 09/19/20 0247  HGB 9.0* 8.1* 8.4*  HCT 27.6* 24.9* 25.8*  PLT 89* 82* 89*  CREATININE 0.99 1.17* 0.92  CKTOTAL 133  --   --     Estimated Creatinine Clearance: 29.8 mL/min (by C-G formula based on SCr of 0.92 mg/dL).   Medical History: Past Medical History:  Diagnosis Date  . ANEMIA-NOS   . Cataract    right cataract removed  . Clotting disorder (HCC)    Pletal  . Depression    no longer taking meds/ resolved  . DYSLIPIDEMIA   . GERD (gastroesophageal reflux disease)   . Hemorrhoids   . HYPERTENSION   . Negative colorectal cancer screening using Cologuard test   . Osteoporosis, senile    DEXA 03/2014: -3.3 R fem  . Peripheral vascular disease, unspecified (HCC) dx 02/2010 ABI  . SMOKER     Medications:  Scheduled:  . (feeding supplement) PROSource Plus  30 mL Oral BID BM  . apixaban  10 mg Oral BID   Followed by  . [START ON 09/26/2020] apixaban  5 mg Oral BID  . atorvastatin  40 mg Oral Daily  . [START ON 09/20/2020] cephALEXin  500 mg Oral Q8H  . docusate sodium  100 mg Oral BID  . dronabinol  2.5 mg Oral BID AC  . feeding supplement  237 mL Oral TID BM  . LORazepam  0.5 mg Intravenous Once  . multivitamin with minerals  1 tablet Oral Daily  . pantoprazole  40 mg Oral Daily  . QUEtiapine  25 mg Oral QHS  . sodium chloride flush  3 mL Intravenous Q12H    Assessment: 80 yo female admitted for generalized weakness.  MRI brain showed multifocal infarcts and did not receive tPA due to being outside the time window.  Lower extremity ultrasound today positive for distal DVT.  Primary team discussed with  neurology, DAPT to be discontinued and begin anticoagulation.  Pharmacy consulted to dose Eliquis.   Hgb stable 8's, plts 89.  Discussed with neurology, will discontinue orders for loading dose of Eliquis and just proceed with maintenance dose given concomitant stroke.  Also discussed with neurology, given patients current thrombocytopenia, it was felt to be safest to reduce Eliquis dose to 2.5mg  BID.  Goal of Therapy:  Monitor platelets by anticoagulation protocol: Yes   Plan:  Eliquis 2.5mg  BID starting 5/30 (received 1 dose of Eliquis load 5/29) AVS posted, TOC benefits check in place Monitor daily CBC, s/sx bleeding  Rexford Maus, PharmD PGY-1 Acute Care Pharmacy Resident 09/19/2020,1:48 PM

## 2020-09-19 NOTE — Progress Notes (Signed)
Palliative Medicine Inpatient Follow Up Note  Reason for consult:  Goals of Care  HPI:  Per intake H&P --> 80 year old F with PMH of PVD, HTN, HLDanddepression presenting with generalized weakness, intermittent nausea and vomiting, poor appetite and unintentional weight loss for about 2 weeks, andadmitted for FTT, depression with passivesuicidally andAKI.Identified by MRI to have multifocal bilateral cerebral and cerebellar stroke(s).  Palliative care has been asked to get involved in the setting of ongoing failure to thrive to further address and discuss goals of care.  Today's Discussion (09/19/2020):  *Please note that this is a verbal dictation therefore any spelling or grammatical errors are due to the "[place]" system interpretation.  Chart reviewed.  I met at bedside this morning with patient's nurse, Melissa.  Melissa shares with me that Wynelle has a very pungent smell to her urine and the color has been notably dark in appearance.  We reviewed the patient's prior urine cultures which were positive for E. coli.  This information was endorsed to Dr. Cyndia Skeeters who is started Maleyah on a short course of ceftriaxone.  Upon assessment of Nakyla she is alert and oriented.  Per her nursing staff she has been incrementally more sleepy.  Has been encouraged to get out of bed to the chair.  Her appetite has been fair.  Reviewed to continue with an ongoing trial of treatments to see if further improvements can be made.  Questions and concerns addressed   Objective Assessment: Vital Signs Vitals:   09/19/20 0345 09/19/20 0731  BP: (!) 134/59 134/77  Pulse: 72 81  Resp: 18 18  Temp: 98.9 F (37.2 C) 98 F (36.7 C)  SpO2: 95% 98%    Intake/Output Summary (Last 24 hours) at 09/19/2020 1884 Last data filed at 09/18/2020 1800 Gross per 24 hour  Intake 103 ml  Output 300 ml  Net -197 ml   Last Weight  Most recent update: 09/19/2020  7:07 AM   Weight  40 kg (88 lb 2.9 oz)            Gen:  Elderly F in NAD HEENT: moist mucous membranes CV: Regular rate and rhythm  PULM: clear to auscultation bilaterally  ABD: soft/nontender  EXT: No edema  Neuro: Alert and oriented x2-3  SUMMARY OF RECOMMENDATIONS DNAR/DNI  MOST Completed, paper copy placed onto the chart electric copy can be found in [place]  DNR Form Completed, paper copy placed onto the chart electric copy can be found in [place]  Continue time Trial of treatments (Started 5/27 -  ), patient is responding well  Per review of therapy notes skilled nursing is recommended  Would benefit from OP Palliative support   Symptom Management: Failure to Thrive:                 - Dietician involved                 - Calorie Count                 - Encourage 1:1 feeding                 - Supplemental nutrients                 - Will trial Marinol 2.20m PO BID  Depression:                 -Appreciate psychiatry insights                 -  Continue Lexapro  (+) UA, E.Coli:  - CTX per primary  Time Spent: 25 Greater than 50% of the time was spent in counseling and coordination of care ______________________________________________________________________________________ [place] Team Team Cell Phone: [phone] Please utilize secure chat with additional questions, if there is no response within 30 minutes please call the above phone number  Palliative Medicine Team providers are available by phone from 7am to 7pm daily and can be reached through the team cell phone.  Should this patient require assistance outside of these hours, please call the patient's attending physician.

## 2020-09-19 NOTE — [place] FL2 (Signed)
[place] MEDICAID FL2 LEVEL OF CARE SCREENING TOOL     IDENTIFICATION  Patient Name: Jennifer [name] Birthdate: [birth year] Sex: female Admission Date (Current Location): 09/15/2020  [facility] and [place] Number:  Producer, television/film/video and Address:  [facility], [address] [place], [place] [address]      Provider Number: [id number]  Attending Physician Name and Address:  Almon Hercules, MD  Relative Name and Phone Number:  [name], sister, [phone]    Current Level of Care: Hospital Recommended Level of Care: Skilled Nursing Facility Prior Approval Number:    Date Approved/Denied:   PASRR Number: [id number]  Discharge Plan: SNF    Current Diagnoses: Patient Active Problem List   Diagnosis Date Noted  . Protein-calorie malnutrition, severe 09/18/2020  . Cerebral embolism with cerebral infarction 09/16/2020  . Failure to thrive in adult 09/15/2020  . Weight loss 09/07/2020  . SOB (shortness of breath) on exertion 09/07/2020  . Gait abnormality 09/07/2020  . Dizziness after extension of neck 09/07/2020  . Chronic low back pain 07/05/2020  . Aortic atherosclerosis (HCC) 06/15/2020  . Malnutrition (HCC) 09/03/2019  . Sacral insufficiency fracture 08/04/2019  . Vitamin D deficiency 08/04/2019  . Closed compression fracture of L1 vertebra (HCC) 08/01/2019  . Back pain 07/05/2019  . Lumbar radiculopathy 06/27/2019  . Lung nodule 06/10/2019  . Abdominal aortic aneurysm (AAA) (HCC) 06/10/2019  . Chest pain, musculoskeletal 06/10/2019  . Ascending aortic aneurysm (HCC) 06/10/2019  . Hiatal hernia, large 01/09/2018  . GERD (gastroesophageal reflux disease) 12/08/2016  . History of Clostridium difficile infection 11/29/2016  . Prediabetes 11/02/2016  . Carotid arterial disease (HCC) 10/20/2015  . Anxiety state   . PAD (peripheral artery disease) (HCC) 10/10/2011  . Osteoporosis, senile 05/17/2011  . Dyslipidemia 05/03/2009  .  Tobacco abuse 04/14/2009  . Essential hypertension 04/14/2009    Orientation RESPIRATION BLADDER Height & Weight     Self,Situation,Place  Normal Incontinent,External catheter Weight: 88 lb 2.9 oz (40 kg) Height:     BEHAVIORAL SYMPTOMS/MOOD NEUROLOGICAL BOWEL NUTRITION STATUS      Incontinent Diet (See DC summary)  AMBULATORY STATUS COMMUNICATION OF NEEDS Skin   Extensive Assist Verbally Normal                       Personal Care Assistance Level of Assistance  Bathing,Feeding,Dressing Bathing Assistance: Maximum assistance Feeding assistance: Limited assistance Dressing Assistance: Maximum assistance     Functional Limitations Info  Sight,Hearing,Speech Sight Info: Adequate Hearing Info: Adequate Speech Info: Adequate    SPECIAL CARE FACTORS FREQUENCY  PT (By licensed PT),OT (By licensed OT)     PT Frequency: 5x week OT Frequency: 5x week            Contractures Contractures Info: Not present    Additional Factors Info  Code Status,Allergies,Psychotropic Code Status Info: DNR Allergies Info: NKA Psychotropic Info: Lorazepam (Ativan), Quetiapine (Seroquel)         Current Medications (09/19/2020):  This is the current hospital active medication list Current Facility-Administered Medications  Medication Dose Route Frequency Provider Last Rate Last Admin  . (feeding supplement) PROSource Plus liquid 30 mL  30 mL Oral BID BM Gonfa, Taye T, MD   30 mL at 09/19/20 1343  . acetaminophen (TYLENOL) tablet 650 mg  650 mg Oral Q6H PRN Jonah Blue, MD   650 mg at 09/15/20 2309   Or  . acetaminophen (TYLENOL) suppository 650 mg  650  mg Rectal Q6H PRN Jonah Blue, MD      . albuterol (PROVENTIL) (2.5 MG/3ML) 0.083% nebulizer solution 2.5 mg  2.5 mg Nebulization Q2H PRN Jonah Blue, MD      . apixaban Everlene Balls) tablet 10 mg  10 mg Oral BID Almon Hercules, MD       Followed by  . [START ON 09/26/2020] apixaban (ELIQUIS) tablet 5 mg  5 mg Oral BID Gonfa, Taye  T, MD      . atorvastatin (LIPITOR) tablet 40 mg  40 mg Oral Daily Candelaria Stagers T, MD   40 mg at 09/19/20 1034  . bisacodyl (DULCOLAX) EC tablet 5 mg  5 mg Oral Daily PRN Jonah Blue, MD      . Melene Muller ON 09/20/2020] cephALEXin (KEFLEX) capsule 500 mg  500 mg Oral Q8H Gonfa, Taye T, MD      . docusate sodium (COLACE) capsule 100 mg  100 mg Oral BID Jonah Blue, MD   100 mg at 09/19/20 1034  . dronabinol (MARINOL) capsule 2.5 mg  2.5 mg Oral BID AC Ernie Avena, NP   2.5 mg at 09/19/20 1343  . feeding supplement (ENSURE ENLIVE / ENSURE PLUS) liquid 237 mL  237 mL Oral TID BM Gonfa, Taye T, MD   237 mL at 09/19/20 1344  . HYDROcodone-acetaminophen (NORCO/VICODIN) 5-325 MG per tablet 1 tablet  1 tablet Oral Q6H PRN Almon Hercules, MD   1 tablet at 09/18/20 2225  . LORazepam (ATIVAN) injection 0.5 mg  0.5 mg Intravenous Once Jonah Blue, MD      . morphine 2 MG/ML injection 2 mg  2 mg Intravenous Q2H PRN Jonah Blue, MD      . multivitamin with minerals tablet 1 tablet  1 tablet Oral Daily Almon Hercules, MD   1 tablet at 09/19/20 1034  . nicotine (NICODERM CQ - dosed in mg/24 hours) patch 14 mg  14 mg Transdermal Daily PRN Jonah Blue, MD      . ondansetron [facility]) tablet 4 mg  4 mg Oral Q6H PRN Jonah Blue, MD       Or  . ondansetron [facility]) injection 4 mg  4 mg Intravenous Q6H PRN Jonah Blue, MD   4 mg at 09/18/20 2029  . pantoprazole (PROTONIX) EC tablet 40 mg  40 mg Oral Daily Jonah Blue, MD   40 mg at 09/19/20 1034  . polyethylene glycol (MIRALAX / GLYCOLAX) packet 17 g  17 g Oral Daily PRN Jonah Blue, MD      . QUEtiapine (SEROQUEL) tablet 25 mg  25 mg Oral Noemi Chapel, MD   25 mg at 09/18/20 2225  . sodium chloride flush (NS) 0.9 % injection 3 mL  3 mL Intravenous Q12H Jonah Blue, MD   3 mL at 09/19/20 1000     Discharge Medications: Please see discharge summary for a list of discharge medications.  Relevant Imaging  Results:  Relevant Lab Results:   Additional Information SSN [id number]  Carley Hammed, [place]

## 2020-09-19 NOTE — Plan of Care (Signed)

## 2020-09-19 NOTE — Progress Notes (Addendum)
STROKE TEAM PROGRESS NOTE   INTERVAL HISTORY No acute events. VSS. Afebrile. PLT 89. She was started on ceftriaxone for UTI.   Today she is sitting up in chair at bedside eating bacon and eggs. About 30 percent intake at this point. She reports feeling "okay" today. Denies new complaints or concerns.   Reiterated our discussion yesterday regarding stroke diagnosis, ongoing work up with DVT study pending. She again is not particularly attentive or engaged in our conversation. She does not ask questions.   Vitals:   09/19/20 0000 09/19/20 0345 09/19/20 0700 09/19/20 0731  BP: 135/81 (!) 134/59  134/77  Pulse: 69 72  81  Resp: 19 18  18   Temp: 98 F (36.7 C) 98.9 F (37.2 C)  98 F (36.7 C)  TempSrc: Oral Oral  Oral  SpO2: 96% 95%  98%  Weight:   40 kg    CBC:  Recent Labs  Lab 09/18/20 0129 09/19/20 0247  WBC 8.2 7.2  HGB 8.1* 8.4*  HCT 24.9* 25.8*  MCV 79.6* 79.4*  PLT 82* 89*   Basic Metabolic Panel:  Recent Labs  Lab 09/18/20 0129 09/19/20 0247  NA 136 134*  K 3.5 3.9  CL 100 100  CO2 25 24  GLUCOSE 118* 98  BUN 19 18  CREATININE 1.17* 0.92  CALCIUM 8.3* 7.7*  MG 2.1 1.8  PHOS 1.7* 4.4   Lipid Panel:  Recent Labs  Lab 09/17/20 0355  CHOL 142  TRIG 112  HDL 31*  CHOLHDL 4.6  VLDL 22  LDLCALC 89   HgbA1c:  Recent Labs  Lab 09/17/20 0355  HGBA1C 5.7*   Urine Drug Screen: No results for input(s): LABOPIA, COCAINSCRNUR, LABBENZ, AMPHETMU, THCU, LABBARB in the last 168 hours.  Alcohol Level No results for input(s): ETH in the last 168 hours.  IMAGING past 24 hours No results found. PHYSICAL EXAM Constitutional: Frail petite malnourished looking elderly Caucasian lady not in distress.  Psych: Affect appropriate to situation, calm and cooperative  HENT: No oropharyngeal obstruction.  MSK: no joint deformities. Respiratory: Effort normal, non-labored breathing Skin: Warm dry and intact visible skin  Neuro: Mental Status: Patient is alert  sitting up feeding herself. Speech is clear without dysarthria.  She remains with diminished attention. She follows simple 1 and some two-step commands.   Patient is oriented to person and place, but not to month, year, or situation.  Patient is unable to give a clear and coherent history.  No signs of aphasia  She extinguishes to double simultaneous stimulation, appears to have some left neglect  Cranial Nerves: II: She has a left hemianopia.  Pupils are equal, round, and reactive to light.    III,IV, VI: EOMI without ptosis or diploplia.  V: Facial sensation is symmetric to temperature VII: Facial movement with mild flattening of left nasolabial fold VIII: hearing is intact to voice X: Unable to assess  XI: Shoulder shrug is symmetric. XII: tongue is midline without atrophy or fasciculations.   Motor: She does not cooperate with formal strength testing but she does move all extremities spontaneously.  But has spastic right hemiparesis with contracture of the right upper extremities  and moves right side less than the left  Sensory: Sensation is symmetric to light touch and temperature in the arms and legs    Cerebellar: Unable to assess   ASSESSMENT/PLAN  Jennifer [name] is a 80 y.o. female, an at least 1ppd smoker (62 pack year history) with prior history of left lobe  mass (untreated), PVD, htn, HLD persenting with weakness and numerous falls occurring for the past several weeks, associated with dizziness, as well as poor appetite/anorexia with unexpected weight loss of 10lbs in one month. She presented outside the tPA window. Work up showed showed new 1cm mass at lateral left upper lobe found on chest CT as well as both acute and remote strokes. Admitted for ongoing stroke work up, failure to thrive, depression with passive suicidality and AKI.   Stroke - Multifocal ischemic strokes of both cerebral and cerebellar hemispheres, likely embolic shower, from hypercoagulable state in  the setting of possible lung cancer.  Code Stroke CT head No acute abnormality.   CTA head & neck: No LVO, Widespread atherosclerosis with 65% stenosis of the proximal left ICA.  MRI: Multifocal ischemic infarcts.  Underlying moderately advanced cerebral atrophy with chronic microvascular ischemic disease, with multiple additional remote lacunar infarcts about the bilateral thalami and pons. Multiple chronic micro hemorrhages  2D Echo (poor study):  EF 60-65%. No shunt, wall motion abnormality or thrombus seen but study was limited.   Carotid doppler study: non-hemodynamically significant plaque less than 50% bilaterally  LE doppler study findings consistent with acute deep vein thrombosis involving the left peroneal veins, and left gastrocnemius veins  LDL 89  HgbA1c 6.0  VTE prophylaxis - SCDs  On ASA 81mg  prior to admission, now on eliquis given DVT and hypercoagulable state.  Therapy recommendations:  SNF  Disposition:  TBD  Goals of Care: DNR/DNI.   ? Lung cancer   CT chest found 1 cm of left upper lobe mass concerning for malignancy.   IR consulted for possible biopsy but have declined and recommended outpatient PET  MRI brain with contrast - not definite for brain mets, needs follow up MRI brain with contrast in 4 weeks.  Thrombocytopenia   Plt 98->72->89->82->89  Close monitoring  On Pletal and ASA 81mg  prior to admission, now on Eliquis  Left LE DVT  LE doppler study findings consistent with acute deep vein thrombosis involving the left peroneal veins, and left gastrocnemius veins  Likely related patient lack of mobility and hypercoagulable state  Now on Eliquis  Hypertension . Permissive hypertension (OK if < 220/120) but gradually normalize in 5-7 days . Long-term BP goal normotensive  Hyperlipidemia  Home meds: Lipitor 20mg  daily  LDL 89, goal < 70  On lipitor 40  Continue statin at discharge  Tobacco abuse  Current smoker  Smoking  cessation counseling provided  Pt is willing to quit  Other Stroke Risk Factors  Advanced Age >/= 1   Other Active Problems  Malnutrition/Failure to thrive   AKI Cre 1.17-0.92  Depression, recently started on Lexapro about a week ago but stopped on 5/26 by psychiatry as they were concerned it could contribute to nausea and vomiting  Hospital day # 3  Delila A Bailey-Modzik, NP-C  ATTENDING NOTE: I reviewed above note and agree with the assessment and plan. Pt was seen and examined.   No acute event overnight, patient neuro stable.  LE venous Doppler found left lower extremity DVT which consistent with possible hypercoagulable state likely lung cancer.  MRI brain with contrast showed enhancement could be current acute/subacute stroke versus brain mets, not definite, recommend MRI brain with contrast repeat in 4 weeks.  IR also consulted for lung mass biopsy, however not good candidate to tolerate procedure, recommend outpatient PET study.  Palliative care on board, discussed with family, currently DNR/DNI, however would like continue trial of treatment.  We will start Eliquis for DVT and hypercoagulable state treatment.  Continue statin.  Quit smoking.  PT/OT recommend SNF.  Neurology will sign off. Please call with questions. Pt will follow up with stroke clinic NP at [facility] in about 4 weeks. Thanks for the consult.  For detailed assessment and plan, please refer to above as I have made changes wherever appropriate.   Marvel Plan, MD PhD Stroke Neurology 09/19/2020 5:40 PM      To contact Stroke Continuity provider, please refer to [email or web address]. After hours, contact General Neurology

## 2020-09-19 NOTE — Progress Notes (Addendum)
[facility])  Referral received for outpatient palliative care services once she discharges.  [facility] will follow.  In her dc papers, please place order for palliative to follow at the facility she dc's to.   Thank you, Wallis Bamberg RN, BSN, CCRN [facility] Liaison

## 2020-09-19 NOTE — Progress Notes (Signed)
PROGRESS NOTE  Jennifer BackersSusan L [name] WJX:[id number]:[id number] DOB: [birth year]   PCP: Pincus SanesBurns, Stacy J, MD  Patient is from: Home.  Lives alone.  DOA: 09/15/2020 LOS: 3  Chief complaints: Generalized weakness  Brief Narrative / Interim history: 80 year old F with PMH of PVD, HTN, HLD, tobacco abuse and depression presenting with generalized weakness, intermittent nausea and vomiting, poor appetite and unintentional weight loss for about 2 weeks, and admitted for FTT, depression with passive suicidally and AKI.  Patient was recently started on Lexapro.  MRI brain showed multifocal infarcts including cortical and subcortical areas of both cerebral and cerebellar hemispheres.  Patient is already outside tPA window.  Neurology consulted the next morning. TTE without significant finding.  CTA with widespread atherosclerosis with 65% stenosis of proximal left ICA.  Carotid US with 40 to 59% left ICA stenosis.  LE US positive for distal DVT. Changed DAPT to Eliquis for anticoagulation.   Patient has a CT chest that showed new 1 cm lateral LUL nodule, ascending aortic aneurysm measuring 4.4 cm, partially visualized large AAA (chronic) and a large genital hernia. IR consulted for image guided biopsy of LUL nodule but recommended outpatient PET scan.  MRI brain with contrast ordered.  Palliative medicine following.  Therapy recommended SNF.  Subjective: Seen and examined earlier this morning.  No major events overnight of this morning.  She says she feels much better physically and mentally.  She denies headache, vision change, focal neurodeficits, chest pain, dyspnea, GI or UTI symptoms.  Objective: Vitals:   09/19/20 0345 09/19/20 0700 09/19/20 0731 09/19/20 1146  BP: (!) 134/59  134/77 124/60  Pulse: 72  81 72  Resp: 18  18 18   Temp: 98.9 F (37.2 C)  98 F (36.7 C) 98.7 F (37.1 C)  TempSrc: Oral  Oral Oral  SpO2: 95%  98% 95%  Weight:  40 kg      Intake/Output Summary (Last 24 hours) at 09/19/2020  1332 Last data filed at 09/19/2020 1000 Gross per 24 hour  Intake 103 ml  Output --  Net 103 ml   Filed Weights   09/16/20 0017 09/17/20 0432 09/19/20 0700  Weight: 37.6 kg 36.9 kg 40 kg    Examination:  GENERAL: Frail looking elderly female.  No apparent distress. HEENT: MMM.  Vision and hearing grossly intact.  NECK: Supple.  No apparent JVD.  RESP: On RA.  No IWOB.  Fair aeration bilaterally. CVS:  RRR. Heart sounds normal.  ABD/GI/GU: BS+. Abd soft, NTND.  MSK/EXT:  Moves extremities.  Significant muscle mass and subcu fat loss. SKIN: no apparent skin lesion or wound NEURO: Sleepy but wakes to voice easily.  Fairly oriented.  No apparent focal neuro deficit. PSYCH: Calm.  Bright affect.  Procedures:  None  Microbiology summarized: COVID-19 and influenza PCR nonreactive.  Assessment & Plan: Multifocal bilateral cerebral and cerebellar embolic stroke-noted on MRI brain. LE US positive for left distal DVT.  No focal neurodeficit but generalized weakness.  She is already outside tPA window.  TTE without significant finding or PFO.  CTA with 65% left ICA stenosis but 40 to 59% on carotid US -Changed DAPT to Eliquis after discussion with neurology -Continue statin -Therapy recommended SNF.  Failure to thrive in adult/history of anorexia nervosa- seems to be psychogenic but CVA, possible lung malignancy and cognitive decline could be contributing as well. TSH and CK within normal.  Improving. -Liberated diet-upright for meals given large hiatal hernia -Agree with Marinol for appetite -Calorie count underway 5/27>> -Appreciate  input by psychiatry, neurology, palliative medicine and dietitian  Major depression with passive suicidal ideation: bright affect for the last 2 days -Appreciate input by psychiatry  -Stopped Lexapro out of concern for nausea and vomiting  -Continue low-dose Seroquel -Psychiatry signed off.  LLE DVT-LE ultrasound with left peroneal and gastrocnemius  DVT. -Started back Eliquis   AKI/azotemia: Resolved. Recent Labs    12/09/19 1433 07/05/20 1436 09/07/20 1504 09/15/20 1109 09/16/20 0333 09/17/20 0355 09/18/20 0129 09/19/20 0247  BUN 22 14 13  33* 26* 15 19 18   CREATININE 0.89 0.97 1.01 1.47* 1.12* 0.99 1.17* 0.92  -Continue monitoring  Hypokalemia/hypomagnesemia/hypophosphatemia: Resolved  Essential hypertension: Normotensive for most part. -Continue holding home amlodipine  LUL nodule concerning for malignancy given patient's heavy smoking history-about 1 cm LUL nodule seen on CT chest.  -IR consulted for image guided biopsy but felt to be high risk and recommended outpatient PT. -Follow-up MRI brain with contrast  Ascending and abdominal aortic aneurysm/large AAA-not a surgical candidate. -Optimize BP  -Continue annual imaging  Emphysema/pulmonary scarring-likely from heavy smoking. -No respiratory symptoms  E. coli bacteriuria-patient denies UTI symptoms.  No leukocytosis but mild temp to 99.8 on 5/26.  -We will treat with short course of antibiotic given her presentation  HLD -Continue Lipitor  PVD -Continue ASA -Now on Plavix  Tobacco use disorder -Encourage cessation -Nicotine patch  Thrombocytopenia: Relatively stable.  Likely due to malnutrition Recent Labs  Lab 09/15/20 1057 09/16/20 0333 09/17/20 0355 09/18/20 0129 09/19/20 0247  PLT 98* 72* 89* 82* 89*  -Continue monitoring  Goal of care-significant comorbidities as above.  Guarded prognosis but early to tell her response to treatment.  DNR/DNI appropriate.  Palliative medicine following.  Severe malnutrition/FTT-decreased appetite with unintentional weight loss Body mass index is 15.14 kg/m. Nutrition Problem: Severe Malnutrition Etiology: chronic illness (anorexia nervosa) Signs/Symptoms: energy intake < or equal to 75% for > or equal to 1 month,severe fat depletion,severe muscle depletion,percent weight loss Percent weight loss: 11  % (over 6 months) Interventions: Ensure Enlive (each supplement provides 350kcal and 20 grams of protein),Magic cup,Prostat,MVI,Calorie Count,Education   DVT prophylaxis:  On Eliquis for DVT  Code Status: DNR/DNI Family Communication: Updated patient's sister over the phone  Level of care: Telemetry Medical  Status is: Inpatient  Remains inpatient appropriate because:Ongoing diagnostic testing needed not appropriate for outpatient work up, Unsafe d/c plan, IV treatments appropriate due to intensity of illness or inability to take PO and Inpatient level of care appropriate due to severity of illness   Dispo: The patient is from: Home              Anticipated d/c is to: SNF              Patient currently is not medically stable to d/c.   Difficult to place patient No   Consultants:  Neurology Palliative medicine Psychiatry-signed off   Sch Meds:  Scheduled Meds: . (feeding supplement) PROSource Plus  30 mL Oral BID BM  . atorvastatin  40 mg Oral Daily  . [START ON 09/20/2020] cephALEXin  500 mg Oral Q8H  . docusate sodium  100 mg Oral BID  . dronabinol  2.5 mg Oral BID AC  . feeding supplement  237 mL Oral TID BM  . LORazepam  0.5 mg Intravenous Once  . multivitamin with minerals  1 tablet Oral Daily  . pantoprazole  40 mg Oral Daily  . QUEtiapine  25 mg Oral QHS  . sodium chloride flush  3 mL  Intravenous Q12H   Continuous Infusions:  PRN Meds:.acetaminophen **OR** acetaminophen, albuterol, bisacodyl, HYDROcodone-acetaminophen, morphine injection, nicotine, ondansetron **OR** ondansetron (ZOFRAN) IV, polyethylene glycol  Antimicrobials: Anti-infectives (From admission, onward)   Start     Dose/Rate Route Frequency Ordered Stop   09/20/20 0600  cephALEXin (KEFLEX) capsule 500 mg        500 mg Oral Every 8 hours 09/19/20 1316 09/24/20 0559   09/19/20 0900  cefTRIAXone (ROCEPHIN) 1 g in sodium chloride 0.9 % 100 mL IVPB  Status:  Discontinued        1 g 200 mL/hr over  30 Minutes Intravenous Every 24 hours 09/19/20 0811 09/19/20 1316       I have personally reviewed the following labs and images: CBC: Recent Labs  Lab 09/15/20 1057 09/16/20 0333 09/17/20 0355 09/18/20 0129 09/19/20 0247  WBC 9.6 7.4 8.7 8.2 7.2  HGB 11.9* 9.0* 9.0* 8.1* 8.4*  HCT 37.5 27.0* 27.6* 24.9* 25.8*  MCV 81.0 78.5* 78.6* 79.6* 79.4*  PLT 98* 72* 89* 82* 89*   BMP &GFR Recent Labs  Lab 09/15/20 1109 09/16/20 0333 09/17/20 0355 09/18/20 0129 09/19/20 0247  NA 135 138 136 136 134*  K 3.1* 2.7* 2.8* 3.5 3.9  CL 101 107 104 100 100  CO2 GLUCOSE 101* 93 93 118* 98  BUN 33* 26* CREATININE 1.47* 1.12* 0.99 1.17* 0.92  CALCIUM 9.2 8.2* 8.1* 8.3* 7.7*  MG 2.1 1.8 1.6* 2.1 1.8  PHOS  --   --  2.4* 1.7* 4.4   Estimated Creatinine Clearance: 29.8 mL/min (by C-G formula based on SCr of 0.92 mg/dL). Liver & Pancreas: Recent Labs  Lab 09/15/20 1109 09/17/20 0355 09/18/20 0129 09/19/20 0247  AST 24  --   --   --   ALT 8  --   --   --   ALKPHOS 71  --   --   --   BILITOT 0.9  --   --   --   PROT 7.7  --   --   --   ALBUMIN 3.4* 2.7* 2.4* 2.5*   No results for input(s): LIPASE, AMYLASE in the last 168 hours. No results for input(s): AMMONIA in the last 168 hours. Diabetic: Recent Labs    09/17/20 0355  HGBA1C 5.7*   Recent Labs  Lab 09/15/20 1617 09/16/20 2202 09/17/20 0106 09/17/20 0428  GLUCAP 95 71 87 91   Cardiac Enzymes: Recent Labs  Lab 09/16/20 0333 09/17/20 0355  CKTOTAL 111 133   No results for input(s): PROBNP in the last 8760 hours. Coagulation Profile: No results for input(s): INR, PROTIME in the last 168 hours. Thyroid Function Tests: No results for input(s): TSH, T4TOTAL, FREET4, T3FREE, THYROIDAB in the last 72 hours. Lipid Profile: Recent Labs    09/17/20 0355  CHOL 142  HDL 31*  LDLCALC 89  TRIG 782  CHOLHDL 4.6   Anemia Panel: No results for input(s): VITAMINB12, FOLATE, FERRITIN, TIBC,  IRON, RETICCTPCT in the last 72 hours. Urine analysis:    Component Value Date/Time   COLORURINE YELLOW 09/16/2020 1522   APPEARANCEUR HAZY (A) 09/16/2020 1522   LABSPEC 1.018 09/16/2020 1522   PHURINE 7.0 09/16/2020 1522   GLUCOSEU NEGATIVE 09/16/2020 1522   GLUCOSEU NEGATIVE 04/21/2015 1135   HGBUR LARGE (A) 09/16/2020 1522   BILIRUBINUR NEGATIVE 09/16/2020 1522   KETONESUR NEGATIVE 09/16/2020 1522   PROTEINUR NEGATIVE 09/16/2020 1522   UROBILINOGEN 0.2 04/21/2015 1135  NITRITE NEGATIVE 09/16/2020 1522   LEUKOCYTESUR TRACE (A) 09/16/2020 1522   Sepsis Labs: Invalid input(s): PROCALCITONIN, LACTICIDVEN  Microbiology: Recent Results (from the past 240 hour(s))  Resp Panel by RT-PCR (Flu A&B, Covid) Nasopharyngeal Swab     Status: None   Collection Time: 09/15/20  1:07 PM   Specimen: Nasopharyngeal Swab; Nasopharyngeal(NP) swabs in vial transport medium  Result Value Ref Range Status   SARS Coronavirus 2 by RT PCR NEGATIVE NEGATIVE Final    Comment: (NOTE) SARS-CoV-2 target nucleic acids are NOT DETECTED.  The SARS-CoV-2 RNA is generally detectable in upper respiratory specimens during the acute phase of infection. The lowest concentration of SARS-CoV-2 viral copies this assay can detect is 138 copies/mL. A negative result does not preclude SARS-Cov-2 infection and should not be used as the sole basis for treatment or other patient management decisions. A negative result may occur with  improper specimen collection/handling, submission of specimen other than nasopharyngeal swab, presence of viral mutation(s) within the areas targeted by this assay, and inadequate number of viral copies(<138 copies/mL). A negative result must be combined with clinical observations, patient history, and epidemiological information. The expected result is Negative.  Fact Sheet for Patients:  BloggerCourse.com  Fact Sheet for Healthcare Providers:   SeriousBroker.it  This test is no t yet approved or cleared by the [place] FDA and  has been authorized for detection and/or diagnosis of SARS-CoV-2 by FDA under an Emergency Use Authorization (EUA). This EUA will remain  in effect (meaning this test can be used) for the duration of the COVID-19 declaration under Section 564(b)(1) of the Act, 21 U.S.C.section 360bbb-3(b)(1), unless the authorization is terminated  or revoked sooner.       Influenza A by PCR NEGATIVE NEGATIVE Final   Influenza B by PCR NEGATIVE NEGATIVE Final    Comment: (NOTE) The Xpert Xpress SARS-CoV-2/FLU/RSV plus assay is intended as an aid in the diagnosis of influenza from Nasopharyngeal swab specimens and should not be used as a sole basis for treatment. Nasal washings and aspirates are unacceptable for Xpert Xpress SARS-CoV-2/FLU/RSV testing.  Fact Sheet for Patients: BloggerCourse.com  Fact Sheet for Healthcare Providers: SeriousBroker.it  This test is not yet approved or cleared by the [place] FDA and has been authorized for detection and/or diagnosis of SARS-CoV-2 by FDA under an Emergency Use Authorization (EUA). This EUA will remain in effect (meaning this test can be used) for the duration of the COVID-19 declaration under Section 564(b)(1) of the Act, 21 U.S.C. section 360bbb-3(b)(1), unless the authorization is terminated or revoked.  Performed at [facility], [address], [place], [place] [address]   Culture, Urine     Status: Abnormal   Collection Time: 09/16/20  3:22 PM   Specimen: Urine, Random  Result Value Ref Range Status   Specimen Description URINE, RANDOM  Final   Special Requests   Final    NONE Performed at [facility], [address], [place], [place] [address]    Culture >=100,000 COLONIES/mL ESCHERICHIA COLI (A)  Final   Report Status 09/19/2020 FINAL  Final    Organism ID, Bacteria ESCHERICHIA COLI (A)  Final      Susceptibility   Escherichia coli - MIC*    AMPICILLIN <=2 SENSITIVE Sensitive     CEFAZOLIN <=4 SENSITIVE Sensitive     CEFEPIME <=0.12 SENSITIVE Sensitive     CEFTRIAXONE <=0.25 SENSITIVE Sensitive     CIPROFLOXACIN <=0.25 SENSITIVE Sensitive     GENTAMICIN <=  1 SENSITIVE Sensitive     IMIPENEM <=0.25 SENSITIVE Sensitive     NITROFURANTOIN <=16 SENSITIVE Sensitive     TRIMETH/SULFA <=20 SENSITIVE Sensitive     AMPICILLIN/SULBACTAM <=2 SENSITIVE Sensitive     PIP/TAZO <=4 SENSITIVE Sensitive     * >=100,000 COLONIES/mL ESCHERICHIA COLI    Radiology Studies: VAS [place] LOWER EXTREMITY VENOUS (DVT)  Result Date: 09/19/2020  Lower Venous DVT Study Patient Name:  Jennifer [name]  Date of Exam:   09/19/2020 Medical Rec #: [id number]         Accession #:    [id number] Date of Birth: 05-30-1940         Patient Gender: F Patient Age:   80Y Exam Location:  [facility] Procedure:      VAS [place] LOWER EXTREMITY VENOUS (DVT) Referring Phys: [id number] Marvel Plan --------------------------------------------------------------------------------  Indications: Stroke.  Limitations: Calcific shadowing. Comparison Study: No previous exams Performing Technologist: Ernestene Mention  Examination Guidelines: A complete evaluation includes B-mode imaging, spectral Doppler, color Doppler, and power Doppler as needed of all accessible portions of each vessel. Bilateral testing is considered an integral part of a complete examination. Limited examinations for reoccurring indications may be performed as noted. The reflux portion of the exam is performed with the patient in reverse Trendelenburg.  +---------+---------------+---------+-----------+----------+--------------+ RIGHT    CompressibilityPhasicitySpontaneityPropertiesThrombus Aging +---------+---------------+---------+-----------+----------+--------------+ CFV      Full           Yes      Yes                                  +---------+---------------+---------+-----------+----------+--------------+ SFJ      Full                                                        +---------+---------------+---------+-----------+----------+--------------+ FV Prox  Full           Yes      Yes                                 +---------+---------------+---------+-----------+----------+--------------+ FV Mid   Full           Yes      Yes                                 +---------+---------------+---------+-----------+----------+--------------+ FV DistalFull           Yes      Yes                                 +---------+---------------+---------+-----------+----------+--------------+ PFV      Full                                                        +---------+---------------+---------+-----------+----------+--------------+ POP      Full           Yes  Yes                                 +---------+---------------+---------+-----------+----------+--------------+ PTV      Full                                                        +---------+---------------+---------+-----------+----------+--------------+ PERO     Full                                                        +---------+---------------+---------+-----------+----------+--------------+   +---------+---------------+---------+-----------+----------+--------------+ LEFT     CompressibilityPhasicitySpontaneityPropertiesThrombus Aging +---------+---------------+---------+-----------+----------+--------------+ CFV      Full           Yes      Yes                                 +---------+---------------+---------+-----------+----------+--------------+ SFJ      Full                                                        +---------+---------------+---------+-----------+----------+--------------+ FV Prox  Full           Yes      Yes                                  +---------+---------------+---------+-----------+----------+--------------+ FV Mid   Full           Yes      Yes                                 +---------+---------------+---------+-----------+----------+--------------+ FV DistalFull           Yes      Yes                                 +---------+---------------+---------+-----------+----------+--------------+ PFV      Full                                                        +---------+---------------+---------+-----------+----------+--------------+ POP      Full           Yes      Yes                                 +---------+---------------+---------+-----------+----------+--------------+ PTV      Full                                                        +---------+---------------+---------+-----------+----------+--------------+  PERO     None                                         One of paired  +---------+---------------+---------+-----------+----------+--------------+ Gastroc  None                                         One of paired  +---------+---------------+---------+-----------+----------+--------------+     Summary: BILATERAL: -No evidence of popliteal cyst, bilaterally. RIGHT: - There is no evidence of deep vein thrombosis in the lower extremity. - There is no evidence of superficial venous thrombosis.  LEFT: - Findings consistent with acute deep vein thrombosis involving the left peroneal veins, and left gastrocnemius veins. - There is no evidence of superficial venous thrombosis.  *See table(s) above for measurements and observations. Electronically signed by Sherald Hess MD on 09/19/2020 at 10:20:10 AM.    Final       Rea Kalama T. Mylah Baynes [facility]  If 7PM-7AM, please contact night-coverage [email or web address] 09/19/2020, 1:32 PM

## 2020-09-20 DIAGNOSIS — E43 Unspecified severe protein-calorie malnutrition: Secondary | ICD-10-CM | POA: Diagnosis not present

## 2020-09-20 DIAGNOSIS — I10 Essential (primary) hypertension: Secondary | ICD-10-CM | POA: Diagnosis not present

## 2020-09-20 DIAGNOSIS — Z720 Tobacco use: Secondary | ICD-10-CM | POA: Diagnosis not present

## 2020-09-20 DIAGNOSIS — R627 Adult failure to thrive: Secondary | ICD-10-CM | POA: Diagnosis not present

## 2020-09-20 LAB — RENAL FUNCTION PANEL
Albumin: 2.6 g/dL — ABNORMAL LOW (ref 3.5–5.0)
Anion gap: 8 (ref 5–15)
BUN: 24 mg/dL — ABNORMAL HIGH (ref 8–23)
CO2: 25 mmol/L (ref 22–32)
Calcium: 8.7 mg/dL — ABNORMAL LOW (ref 8.9–10.3)
Chloride: 106 mmol/L (ref 98–111)
Creatinine, Ser: 1.13 mg/dL — ABNORMAL HIGH (ref 0.44–1.00)
GFR, Estimated: 49 mL/min — ABNORMAL LOW (ref >60)
Glucose, Bld: 102 mg/dL — ABNORMAL HIGH (ref 70–99)
Phosphorus: 3.5 mg/dL (ref 2.5–4.6)
Potassium: 4.1 mmol/L (ref 3.5–5.1)
Sodium: 139 mmol/L (ref 135–145)

## 2020-09-20 LAB — CBC
HCT: 28.2 % — ABNORMAL LOW (ref 36.0–46.0)
Hemoglobin: 9 g/dL — ABNORMAL LOW (ref 12.0–15.0)
MCH: 25.9 pg — ABNORMAL LOW (ref 26.0–34.0)
MCHC: 31.9 g/dL (ref 30.0–36.0)
MCV: 81.3 fL (ref 80.0–100.0)
Platelets: 127 10*3/uL — ABNORMAL LOW (ref 150–400)
RBC: 3.47 MIL/uL — ABNORMAL LOW (ref 3.87–5.11)
RDW: 17.2 % — ABNORMAL HIGH (ref 11.5–15.5)
WBC: 9.7 10*3/uL (ref 4.0–10.5)
nRBC: 0 % (ref 0.0–0.2)

## 2020-09-20 LAB — MAGNESIUM: Magnesium: 1.9 mg/dL (ref 1.7–2.4)

## 2020-09-20 MED ORDER — APIXABAN 5 MG PO TABS
5.0000 mg | ORAL_TABLET | Freq: Two times a day (BID) | ORAL | Status: DC
  Administered 2020-09-20 – 2020-09-29 (×18): 5 mg via ORAL
  Filled 2020-09-20 (×18): qty 1

## 2020-09-20 NOTE — Progress Notes (Signed)
PROGRESS NOTE  Jennifer [name] ZOX:[id number] DOB: 02/07/1941   PCP: Pincus Sanes, MD  Patient is from: Home.  Lives alone.  DOA: 09/15/2020 LOS: 4  Chief complaints: Generalized weakness  Brief Narrative / Interim history: 80 year old F with PMH of PVD, HTN, HLD, tobacco abuse and depression presenting with generalized weakness, intermittent nausea and vomiting, poor appetite and unintentional weight loss for about 2 weeks, and admitted for FTT, depression with passive suicidally and AKI.  Patient was recently started on Lexapro.  MRI brain showed multifocal infarcts including cortical and subcortical areas of both cerebral and cerebellar hemispheres.  Patient is already outside tPA window.  Neurology consulted the next morning. TTE without significant finding.  CTA with widespread atherosclerosis with 65% stenosis of proximal left ICA.  Carotid US with 40 to 59% left ICA stenosis.  LE [place] positive for distal DVT. Changed DAPT to Eliquis for anticoagulation.   Patient has a CT chest that showed new 1 cm lateral LUL nodule, ascending aortic aneurysm measuring 4.4 cm, partially visualized large AAA (chronic) and a large genital hernia. IR consulted for image guided biopsy of LUL nodule but recommended outpatient PET scan.  MRI brain with contrast with punctate multifocal enhancements which could be related to infarct or metastasis.  Follow-up MRI with contrast in approximately 4 weeks recommended.   Palliative medicine following.  Caloric count underway.  Therapy recommended SNF.  Subjective: Seen and examined earlier this morning.  No major events overnight of this morning.  No complaints.  Reports feeling better mentally and physically..  She denies chest pain,, GI or UTI symptoms.  She thinks her appetite is getting better.  Objective: Vitals:   09/19/20 2340 09/20/20 0446 09/20/20 0942 09/20/20 1107  BP: (!) 141/75 (!) 154/69 (!) 137/57 123/63  Pulse: 76 72 64 69  Resp: Temp: 98 F (36.7 C) 97.9 F (36.6 C) 98 F (36.7 C) 98.3 F (36.8 C)  TempSrc:      SpO2: 98% 99%  97%  Weight:       No intake or output data in the 24 hours ending 09/20/20 1123 Filed Weights   09/16/20 0017 09/17/20 0432 09/19/20 0700  Weight: 37.6 kg 36.9 kg 40 kg    Examination:  GENERAL: Frail looking elderly female.  No apparent distress. HEENT: MMM.  Vision and hearing grossly intact.  NECK: Supple.  No apparent JVD.  RESP: On RA.  No IWOB.  Fair aeration bilaterally. CVS:  RRR. Heart sounds normal.  ABD/GI/GU: BS+. Abd soft, NTND.  MSK/EXT:  Moves extremities.  Significant muscle mass and subcu fat loss. SKIN: no apparent skin lesion or wound NEURO: Awake and alert. Oriented appropriately.  No apparent focal neuro deficit. PSYCH: Calm. Normal affect.  Procedures:  None  Microbiology summarized: COVID-19 and influenza PCR nonreactive.  Assessment & Plan: Multifocal bilateral cerebral and cerebellar embolic stroke-noted on MRI brain. LE [place] positive for left distal DVT.  No focal neurodeficit but generalized weakness.  She is already outside tPA window.  TTE without significant finding or PFO.  CTA with 65% left ICA stenosis but 40 to 59% on carotid US -On Eliquis 5 mg twice daily. Won't do starter pack due to risk of bleeding -Continue statin -Therapy recommended SNF.  Failure to thrive in adult/history of anorexia nervosa- seems to be psychogenic but CVA, possible lung malignancy and cognitive decline could be contributing as well. TSH and CK within normal.  Improving. -Liberated diet-upright for meals  given large hiatal hernia -Agree with Marinol for appetite -Calorie count underway 5/27>> -Appreciate input by psychiatry, neurology, palliative medicine and dietitian  Major depression with passive suicidal ideation: bright affect for the last 2 days -Appreciate input by psychiatry  -Stopped Lexapro out of concern for nausea and vomiting  -Continue low-dose  Seroquel -Psychiatry signed off.  LLE DVT-LE US with left peroneal and gastrocnemius DVT. -Started back Eliquis  AKI/azotemia: Cr slightly up today Recent Labs    12/09/19 1433 07/05/20 1436 09/07/20 1504 09/15/20 1109 09/16/20 0333 09/17/20 0355 09/18/20 0129 09/19/20 0247 09/20/20 0404  BUN 22 14 13  33* 26* 15 19 18  24*  CREATININE 0.89 0.97 1.01 1.47* 1.12* 0.99 1.17* 0.92 1.13*  -Continue monitoring  Hypokalemia/hypomagnesemia/hypophosphatemia: Resolved  Essential hypertension: Normotensive for most part. -Continue holding home amlodipine  LUL nodule concerning for malignancy given patient's heavy smoking history-about 1 cm LUL nodule seen on CT chest. MRI brain with contrast with punctate multifocal enhancements which could be related to infarct or metastasis.   -Follow-up MRI with contrast in approximately 4 weeks recommended.  -IR consulted for image guided biopsy but felt to be high risk and recommended outpatient PT.  Ascending and abdominal aortic aneurysm/large AAA-not a surgical candidate. -Optimize BP  -Continue annual imaging  Emphysema/pulmonary scarring-likely from heavy smoking. -No respiratory symptoms  E. coli bacteriuria-patient denies UTI symptoms.  No leukocytosis but mild temp to 99.8 on 5/26.  -Continue Keflex given her presentation  HLD -Continue Lipitor  PVD -On Eliquis.  High risk for bleeding to add antiplatelets  Tobacco use disorder -Encourage cessation -Nicotine patch  Thrombocytopenia: Relatively stable.  Likely due to malnutrition Recent Labs  Lab 09/15/20 1057 09/16/20 0333 09/17/20 0355 09/18/20 0129 09/19/20 0247 09/20/20 0404  PLT 98* 72* 89* 82* 89* 127*  -Continue monitoring  Goal of care-significant comorbidities as above.  Guarded prognosis but early to tell her response to treatment.  DNR/DNI appropriate.  Palliative medicine following.  Severe malnutrition/FTT-decreased appetite with unintentional weight  loss Body mass index is 15.14 kg/m. Nutrition Problem: Severe Malnutrition Etiology: chronic illness (anorexia nervosa) Signs/Symptoms: energy intake < or equal to 75% for > or equal to 1 month,severe fat depletion,severe muscle depletion,percent weight loss Percent weight loss: 11 % (over 6 months) Interventions: Ensure Enlive (each supplement provides 350kcal and 20 grams of protein),Magic cup,Prostat,MVI,Calorie Count,Education   DVT prophylaxis:  On Eliquis for DVT  Code Status: DNR/DNI Family Communication: Updated patient's sister over the phone  Level of care: Telemetry Medical  Status is: Inpatient  Remains inpatient appropriate because:Unsafe d/c plan   Dispo: The patient is from: Home              Anticipated d/c is to: SNF              Patient currently is not medically stable to d/c.   Difficult to place patient No   Consultants:  Neurology Palliative medicine Psychiatry-signed off   Sch Meds:  Scheduled Meds: . (feeding supplement) PROSource Plus  30 mL Oral BID BM  . apixaban  5 mg Oral BID  . atorvastatin  40 mg Oral Daily  . cephALEXin  500 mg Oral Q8H  . docusate sodium  100 mg Oral BID  . dronabinol  2.5 mg Oral BID AC  . feeding supplement  237 mL Oral TID BM  . LORazepam  0.5 mg Intravenous Once  . multivitamin with minerals  1 tablet Oral Daily  . pantoprazole  40 mg Oral Daily  .  QUEtiapine  25 mg Oral QHS  . sodium chloride flush  3 mL Intravenous Q12H   Continuous Infusions:  PRN Meds:.acetaminophen **OR** acetaminophen, albuterol, bisacodyl, HYDROcodone-acetaminophen, morphine injection, nicotine, ondansetron **OR** ondansetron (ZOFRAN) IV, polyethylene glycol  Antimicrobials: Anti-infectives (From admission, onward)   Start     Dose/Rate Route Frequency Ordered Stop   09/20/20 0600  cephALEXin (KEFLEX) capsule 500 mg        500 mg Oral Every 8 hours 09/19/20 1316 09/24/20 0559   09/19/20 0900  cefTRIAXone (ROCEPHIN) 1 g in sodium  chloride 0.9 % 100 mL IVPB  Status:  Discontinued        1 g 200 mL/hr over 30 Minutes Intravenous Every 24 hours 09/19/20 0811 09/19/20 1316       I have personally reviewed the following labs and images: CBC: Recent Labs  Lab 09/16/20 0333 09/17/20 0355 09/18/20 0129 09/19/20 0247 09/20/20 0404  WBC 7.4 8.7 8.2 7.2 9.7  HGB 9.0* 9.0* 8.1* 8.4* 9.0*  HCT 27.0* 27.6* 24.9* 25.8* 28.2*  MCV 78.5* 78.6* 79.6* 79.4* 81.3  PLT 72* 89* 82* 89* 127*   BMP &GFR Recent Labs  Lab 09/16/20 0333 09/17/20 0355 09/18/20 0129 09/19/20 0247 09/20/20 0404  NA 138 136 136 134* 139  K 2.7* 2.8* 3.5 3.9 4.1  CL 107 104 100 100 106  CO2 22 22 25 24 25   GLUCOSE 93 93 118* 98 102*  BUN 26* 15 19 18  24*  CREATININE 1.12* 0.99 1.17* 0.92 1.13*  CALCIUM 8.2* 8.1* 8.3* 7.7* 8.7*  MG 1.8 1.6* 2.1 1.8 1.9  PHOS  --  2.4* 1.7* 4.4 3.5   Estimated Creatinine Clearance: 24.2 mL/min (A) (by C-G formula based on SCr of 1.13 mg/dL (H)). Liver & Pancreas: Recent Labs  Lab 09/15/20 1109 09/17/20 0355 09/18/20 0129 09/19/20 0247 09/20/20 0404  AST 24  --   --   --   --   ALT 8  --   --   --   --   ALKPHOS 71  --   --   --   --   BILITOT 0.9  --   --   --   --   PROT 7.7  --   --   --   --   ALBUMIN 3.4* 2.7* 2.4* 2.5* 2.6*   No results for input(s): LIPASE, AMYLASE in the last 168 hours. No results for input(s): AMMONIA in the last 168 hours. Diabetic: No results for input(s): HGBA1C in the last 72 hours. Recent Labs  Lab 09/15/20 1617 09/16/20 2202 09/17/20 0106 09/17/20 0428  GLUCAP 95 71 87 91   Cardiac Enzymes: Recent Labs  Lab 09/16/20 0333 09/17/20 0355  CKTOTAL 111 133   No results for input(s): PROBNP in the last 8760 hours. Coagulation Profile: No results for input(s): INR, PROTIME in the last 168 hours. Thyroid Function Tests: No results for input(s): TSH, T4TOTAL, FREET4, T3FREE, THYROIDAB in the last 72 hours. Lipid Profile: No results for input(s): CHOL, HDL,  LDLCALC, TRIG, CHOLHDL, LDLDIRECT in the last 72 hours. Anemia Panel: No results for input(s): VITAMINB12, FOLATE, FERRITIN, TIBC, IRON, RETICCTPCT in the last 72 hours. Urine analysis:    Component Value Date/Time   COLORURINE YELLOW 09/16/2020 1522   APPEARANCEUR HAZY (A) 09/16/2020 1522   LABSPEC 1.018 09/16/2020 1522   PHURINE 7.0 09/16/2020 1522   GLUCOSEU NEGATIVE 09/16/2020 1522   GLUCOSEU NEGATIVE 04/21/2015 1135   HGBUR LARGE (A) 09/16/2020 1522   BILIRUBINUR NEGATIVE  09/16/2020 1522   KETONESUR NEGATIVE 09/16/2020 1522   PROTEINUR NEGATIVE 09/16/2020 1522   UROBILINOGEN 0.2 04/21/2015 1135   NITRITE NEGATIVE 09/16/2020 1522   LEUKOCYTESUR TRACE (A) 09/16/2020 1522   Sepsis Labs: Invalid input(s): PROCALCITONIN, LACTICIDVEN  Microbiology: Recent Results (from the past 240 hour(s))  Resp Panel by RT-PCR (Flu A&B, Covid) Nasopharyngeal Swab     Status: None   Collection Time: 09/15/20  1:07 PM   Specimen: Nasopharyngeal Swab; Nasopharyngeal(NP) swabs in vial transport medium  Result Value Ref Range Status   SARS Coronavirus 2 by RT PCR NEGATIVE NEGATIVE Final    Comment: (NOTE) SARS-CoV-2 target nucleic acids are NOT DETECTED.  The SARS-CoV-2 RNA is generally detectable in upper respiratory specimens during the acute phase of infection. The lowest concentration of SARS-CoV-2 viral copies this assay can detect is 138 copies/mL. A negative result does not preclude SARS-Cov-2 infection and should not be used as the sole basis for treatment or other patient management decisions. A negative result may occur with  improper specimen collection/handling, submission of specimen other than nasopharyngeal swab, presence of viral mutation(s) within the areas targeted by this assay, and inadequate number of viral copies(<138 copies/mL). A negative result must be combined with clinical observations, patient history, and epidemiological information. The expected result is  Negative.  Fact Sheet for Patients:  BloggerCourse.com  Fact Sheet for Healthcare Providers:  SeriousBroker.it  This test is no t yet approved or cleared by the [place] FDA and  has been authorized for detection and/or diagnosis of SARS-CoV-2 by FDA under an Emergency Use Authorization (EUA). This EUA will remain  in effect (meaning this test can be used) for the duration of the COVID-19 declaration under Section 564(b)(1) of the Act, 21 U.S.C.section 360bbb-3(b)(1), unless the authorization is terminated  or revoked sooner.       Influenza A by PCR NEGATIVE NEGATIVE Final   Influenza B by PCR NEGATIVE NEGATIVE Final    Comment: (NOTE) The Xpert Xpress SARS-CoV-2/FLU/RSV plus assay is intended as an aid in the diagnosis of influenza from Nasopharyngeal swab specimens and should not be used as a sole basis for treatment. Nasal washings and aspirates are unacceptable for Xpert Xpress SARS-CoV-2/FLU/RSV testing.  Fact Sheet for Patients: BloggerCourse.com  Fact Sheet for Healthcare Providers: SeriousBroker.it  This test is not yet approved or cleared by the [place] FDA and has been authorized for detection and/or diagnosis of SARS-CoV-2 by FDA under an Emergency Use Authorization (EUA). This EUA will remain in effect (meaning this test can be used) for the duration of the COVID-19 declaration under Section 564(b)(1) of the Act, 21 U.S.C. section 360bbb-3(b)(1), unless the authorization is terminated or revoked.  Performed at [facility], [address], [place], [place] [address]   Culture, Urine     Status: Abnormal   Collection Time: 09/16/20  3:22 PM   Specimen: Urine, Random  Result Value Ref Range Status   Specimen Description URINE, RANDOM  Final   Special Requests   Final    NONE Performed at [facility], [address] [place],  [place] [address]    Culture >=100,000 COLONIES/mL ESCHERICHIA COLI (A)  Final   Report Status 09/19/2020 FINAL  Final   Organism ID, Bacteria ESCHERICHIA COLI (A)  Final      Susceptibility   Escherichia coli - MIC*    AMPICILLIN <=2 SENSITIVE Sensitive     CEFAZOLIN <=4 SENSITIVE Sensitive     CEFEPIME <=0.12 SENSITIVE  Sensitive     CEFTRIAXONE <=0.25 SENSITIVE Sensitive     CIPROFLOXACIN <=0.25 SENSITIVE Sensitive     GENTAMICIN <=1 SENSITIVE Sensitive     IMIPENEM <=0.25 SENSITIVE Sensitive     NITROFURANTOIN <=16 SENSITIVE Sensitive     TRIMETH/SULFA <=20 SENSITIVE Sensitive     AMPICILLIN/SULBACTAM <=2 SENSITIVE Sensitive     PIP/TAZO <=4 SENSITIVE Sensitive     * >=100,000 COLONIES/mL ESCHERICHIA COLI    Radiology Studies: MR BRAIN W CONTRAST  Result Date: 09/19/2020 CLINICAL DATA:  Brain mass or lesion. EXAM: MRI HEAD WITH CONTRAST TECHNIQUE: Multiplanar, multiecho pulse sequences of the brain and surrounding structures were obtained with intravenous contrast. CONTRAST:  5mL GADAVIST GADOBUTROL 1 MMOL/ML IV SOLN COMPARISON:  MRI Sep 15, 2020. FINDINGS: Postcontrast imaging performed due to left upper lobe mass seen on recent CT chest. There is punctate enhancement associated with many of the areas of restricted diffusion on recent MRI. For example, there are enhancing lesions in the high bilateral frontal lobes in the precentral gyrus (series 44, image 5). Additional area of enhancing left cerebellum (series 5, image 15), right cerebellum (series 5, image 10), and right anterior basal ganglia (series 5, image 31). Please see recent MRI for additional findings. IMPRESSION: Postcontrast imaging performed due to left upper lobe mass seen on recent CT chest. There is punctate multifocal enhancement at many of the sites of restricted diffusion seen on recent MRI head. These areas of enhancement could relate to enhancement of acute/subacute infarcts or metastases. A follow-up MRI with contrast in  approximately 4 weeks is recommended to further evaluate. Electronically Signed   By: Feliberto Harts MD   On: 09/19/2020 14:06      Annasofia Pohl T. Latandra Loureiro [facility]  If 7PM-7AM, please contact night-coverage [email or web address] 09/20/2020, 11:23 AM

## 2020-09-20 NOTE — Plan of Care (Signed)
Patient seems to be improving but needs reminding of care goals due to forgetfulness and baseline confusion. Patient needed to be coaxed this afternoon to sit up for meds and to get into the room chair for meals.   Problem: Education: Goal: Knowledge of General Education information will improve Description: Including pain rating scale, medication(s)/side effects and non-pharmacologic comfort measures Outcome: Progressing   Problem: Health Behavior/Discharge Planning: Goal: Ability to manage health-related needs will improve Outcome: Progressing   Problem: Clinical Measurements: Goal: Ability to maintain clinical measurements within normal limits will improve Outcome: Progressing Goal: Will remain free from infection Outcome: Progressing Goal: Diagnostic test results will improve Outcome: Progressing Goal: Respiratory complications will improve Outcome: Progressing Goal: Cardiovascular complication will be avoided Outcome: Progressing   Problem: Activity: Goal: Risk for activity intolerance will decrease Outcome: Progressing   Problem: Nutrition: Goal: Adequate nutrition will be maintained Outcome: Progressing   Problem: Coping: Goal: Level of anxiety will decrease Outcome: Progressing   Problem: Elimination: Goal: Will not experience complications related to bowel motility Outcome: Progressing Goal: Will not experience complications related to urinary retention Outcome: Progressing

## 2020-09-20 NOTE — Progress Notes (Signed)
Occupational Therapy Treatment Patient Details Name: Jennifer [name] MRN: [id number] DOB: Mar 25, 1941 Today's Date: 09/20/2020    History of present illness Pt is a 80 y.o. F who presents with weakess, frequent falls, and confusion. CT head negative for acute abnormality. MRIMRI brain showed multifocal infarcts including cortical and subcortical areas of both cerebral and cerebellar hemispheres.Patient has a CT chest that showed new 1 cm lateral LUL nodule, ascending aortic aneurysm measuring 4.4 cm. MRI brain with contrast with punctate multifocal enhancements which could be related to infarct or metastasis.  LE [place] positive for left distal DVT. Significant PMH: HTN, osteoporosis, L1 compression fx s/p kyphoplasty.   OT comments  This 80 yo female seen today to focus on self feeding, grooming, and transfers. She was able to feed herself ~25% of her meal on her own post setup and then able to finish more of her meal (25%) with cues and A. Able to wash her face and hands seated in recliner with min VCs, able to transfer with min A sit<>stand and Mod A stand pivot. She will continue to benefit from acute OT with follow up at SNF.  Follow Up Recommendations  SNF;Supervision/Assistance - 24 hour    Equipment Recommendations  Other (comment) (TBD next venue)       Precautions / Restrictions Precautions Precautions: Fall Restrictions Weight Bearing Restrictions: No       Mobility Bed Mobility Overal bed mobility: Needs Assistance Bed Mobility: Sit to Supine       Sit to supine: Mod assist   General bed mobility comments: went to lay down, got LLE in first then when she brought up RLE she crossed it over LLE and could not get herself scooted over--had to help her sequence through it and provided physical A    Transfers Overall transfer level: Needs assistance Equipment used: 1 person hand held assist Transfers: Sit to/from [employer] Sit to Stand: Min assist Stand  pivot transfers: Mod assist       General transfer comment: Difficulty turning feet for stand pivot. She was min A to scoot back in recliner with cues for hand placement    Balance Overall balance assessment: Needs assistance Sitting-balance support: No upper extremity supported;Feet supported Sitting balance-Leahy Scale: Fair     Standing balance support: Bilateral upper extremity supported Standing balance-Leahy Scale: Poor                             ADL either performed or assessed with clinical judgement   ADL Overall ADL's : Needs assistance/impaired Eating/Feeding: Moderate assistance;Sitting Eating/Feeding Details (indicate cue type and reason): in recliner Grooming: Moderate assistance;Wash/dry hands;Wash/dry face;Sitting Grooming Details (indicate cue type and reason): in Water quality scientist Transfer: Moderate assistance;Stand-pivot Toilet Transfer Details (indicate cue type and reason): recliner to bed going to pt's left                 Vision Baseline Vision/History: Wears glasses            Cognition Arousal/Alertness: Awake/alert (sleepy when I arrived) Behavior During Therapy: Flat affect Overall Cognitive Status: No family/caregiver present to determine baseline cognitive functioning Area of Impairment: Following commands;Safety/judgement;Problem solving                 Orientation Level: Time (june, 1222)     Following Commands: Follows one step commands with  increased time Safety/Judgement: Decreased awareness of safety;Decreased awareness of deficits   Problem Solving: Decreased initiation;Difficulty sequencing;Requires verbal cues;Requires tactile cues                     Pertinent Vitals/ Pain       Pain Assessment: No/denies pain         Frequency  Min 2X/week        Progress Toward Goals  OT Goals(current goals can now be found in the care plan section)  Progress towards OT goals:  Progressing toward goals  Acute Rehab OT Goals Patient Stated Goal: none stated OT Goal Formulation: With patient Time For Goal Achievement: 09/30/20 Potential to Achieve Goals: Good  Plan Discharge plan remains appropriate       AM-PAC OT "6 Clicks" Daily Activity     Outcome Measure   Help from another person eating meals?: A Lot Help from another person taking care of personal grooming?: A Little Help from another person toileting, which includes using toliet, bedpan, or urinal?: A Lot Help from another person bathing (including washing, rinsing, drying)?: A Lot Help from another person to put on and taking off regular upper body clothing?: A Lot Help from another person to put on and taking off regular lower body clothing?: A Lot 6 Click Score: 13    End of Session Equipment Utilized During Treatment: Gait belt  OT Visit Diagnosis: Other abnormalities of gait and mobility (R26.89);Unsteadiness on feet (R26.81);History of falling (Z91.81);Muscle weakness (generalized) (M62.81);Other symptoms and signs involving cognitive function   Activity Tolerance Patient tolerated treatment well   Patient Left in bed;with call bell/phone within reach;with bed alarm set;with family/visitor present   Nurse Communication          Time: 6195-0932 OT Time Calculation (min): 45 min  Charges: OT General Charges $OT Visit: 1 Visit OT Treatments $Self Care/Home Management : 38-52 mins  Ignacia Palma, OTR/L Acute [employer] Pager [phone] Office [phone]      Evette Georges 09/20/2020, 1:39 PM

## 2020-09-20 NOTE — Progress Notes (Signed)
  Speech Language Pathology Treatment: Cognitive-Linquistic  Patient Details Name: Jennifer [name] MRN: [id number] DOB: [birth year] Today's Date: 09/20/2020 Time: 9622-2979 SLP Time Calculation (min) (ACUTE ONLY): 17 min  Assessment / Plan / Recommendation Clinical Impression  Pt administered the SLUMS ([facility] Mental Status Examination) with improved accuracy with certain subtests as previous ones were readministered with better success as pt was more compliant.  Pt exhibited deficits in the areas of memory recall (1/5 objects recalled without cues provided for category/F:2 choices), naming/organizational task (timed), math calculation for simple task, and various attention tasks such as repeating digits backwards.  Pt oriented x4 today and speech is intelligible within conversation with 90% accuracy.  Auditory comprehension task with a simple paragraph yielded 75% accuracy without cues provided.  Pt stated "the words kept going away the other day, but now they are back."  ST will continue to provide cog/linguistic tx while pt is in acute setting.     HPI HPI: Jennifer [name] with medical history significant of PVD; HTN; and HLD presenting with weakness on 09/15/20.  She has been feeling terribly.  She woke up this AM and felt terrible, had no energy.  She felt ok yesterday.  She was tired and weak.  She had a very bad night - she was moaning a lot and calling out and wanting to get out of bed.  She was also incontinent.  She has a lot of back problems.  On a usual day she is able to get up, go to [employer] to check out books, go to the grocery.  She last went to [employer] maybe a week ago and grocery shopped maybe 2 weeks ago.  She hasn't had much of an appetite for the last 2 weeks but "she's never a good eater."  She has lost maybe 10 pounds over maybe a couple of weeks.  She had lost 8 pounds between her last 2 PCP visits.  Intermittent nausea with vomiting; emesis yesterday x 1.  Mild  SOB when running errands.  Family has noticed some memory issues recently over the last weeks, moreso in the last few days.  Family took her home with them on Monday night due to concerns.  Family has noticed that she stopped eating more than a week ago.  She acknowledges depressed mood, passive suicidal ideation; 09/19/20 MRI brain indicated Postcontrast imaging performed due to left upper lobe mass seen on  recent CT chest. There is punctate multifocal enhancement at many of  the sites of restricted diffusion seen on recent MRI head. These  areas of enhancement could relate to enhancement of acute/subacute  infarcts or metastases. A follow-up MRI with contrast in  approximately 4 weeks is recommended to further evaluate; SLE (speech/language evaluation) partially completed on 09/18/20 d/t noncompliance/refusal.      SLP Plan  Continue with current plan of care       Recommendations   TBD                General recommendations: Other(comment) (TBD) Follow up Recommendations: Other (comment) (TBD) SLP Visit Diagnosis: Cognitive communication deficit (G92.119) Plan: Continue with current plan of care                       Tressie Stalker, M.S., CCC-SLP 09/20/2020, 12:25 PM

## 2020-09-20 NOTE — Progress Notes (Signed)
Calorie Count Note   Calorie count, RD to follow-up with results  Ensure Enlive po TID, each supplement provides 350 kcal and 20 grams of protein  Magic cup TID with meals, each supplement provides 290 kcal and 9 grams of protein  30ml Prosource Plus po BID, each supplement provides 100 kcals and 15 grams of protein  MVI with minerals daily  Provided education on improving nutrition status after discharge and increasing kcal/protein intake   Dawnette Mione P., RD, LDN, CNSC See AMiON for contact information

## 2020-09-20 NOTE — TOC Benefit Eligibility Note (Signed)
Transition of Care [facility]) Benefit Eligibility Note    Patient Details  Name: Jennifer [name] MRN: [id number] Date of Birth: Apr 12, 1941   Medication/Dose: Arne Cleveland  5 MG BID     Tier:  (NO TIER)  Prescription Coverage [facility]   Roseanne Kaufman with Person/Company/Phone Number:: QJJHER  @ [employer] #  [phone]  Co-Pay: $47.00  Prior Approval: No  Deductible: Met  Additional Notes: APIX[employer]AN : Crecencio Mc Phone Number: 09/20/2020, 11:30 AM

## 2020-09-20 NOTE — Discharge Instructions (Signed)
Nutrition Post Hospital Stay Proper nutrition can help your body recover from illness and injury.   Foods and beverages high in protein, vitamins, and minerals help rebuild muscle loss, promote healing, & reduce fall risk.   .In addition to eating healthy foods, a nutrition shake is an easy, delicious way to get the nutrition you need during and after your hospital stay  It is recommended that you continue to drink 2 bottles per day of:       Ensure Complete for at least 3 months (90 days) after your hospital stay   Tips for adding a nutrition shake into your routine: As allowed, drink one with vitamins or medications instead of water or juice Enjoy one as a tasty mid-morning or afternoon snack Drink cold or make a milkshake out of it Drink one instead of milk with cereal or snacks Use as a coffee creamer   Available at the following grocery stores and pharmacies:           * Karin Golden * [employer] * [employer]co  * Rite Aid          * Walmart * [employer] Club  * [facility]      * Target  * BJ's   * [facility]  * [employer]   * [facility] [phone]            For COUPONS visit: www.ensure.com/join or RoleLink.com.br   Suggested Substitutions Ensure Plus = Boost Plus = Carnation Breakfast Essentials = Boost Compact Ensure Active Clear = Boost Breeze Glucerna Shake = Boost Glucose Control = Carnation Breakfast Essentials SUGAR FREE    Suggestions For Increasing Calories And Protein . Several small meals a day are easier to eat and digest than three large ones. Space meals about 2 to 3 hours apart to maximize comfort. . Stop eating 2 to 3 hours before bed and sleep with your head elevated if gastric reflux (GERD) and heartburn are problems. . Do not eat your favorite foods if you are feeling bad. Save them for when you feel good! . Eat breakfast-type foods at any meal. Eggs are usually easy to eat and are great any time of the day. (The same goes for pancakes  and waffles.) . Eat when you feel hungry. Most people have the greatest appetite in the morning because they have not eaten all night. If this is the best meal for you, then pile on those calories and other nutrients in the morning and at lunch. Then you can have a smaller dinner without losing total calories for the day. . Eat leftovers or nutritious snacks in the afternoon and early evening to round out your day. . Try homemade or commercially prepared nutrition bars and puddings, as well as calorie- and protein-rich liquid nutritional supplements. Benefits of Physical Activity Talk to your doctor about physical activity. Light or moderate physical activity can help maintain muscle and promote an appetite. Walking in the neighborhood or the local mall is a great way to get up, get out, and get moving. If you are unsteady on your feet, try walking around the dining room table. Save Room for [employer]! Drink most fluids between meals instead of with meals. (It is fine to have a sip to help swallow food at meal time.) Fluids (which usually have fewer calories and nutrients than solid food) can take up valuable space in your stomach.  Foods Recommended High-Protein Foods Milk products Add cheese to toast, crackers, sandwiches, baked potatoes,  vegetables, soups, noodles, meat, and fruit. Use reduced-fat (2%) or whole milk in place of water when cooking cereal and cream soups. Include cream sauces on vegetables and pasta. Add powdered milk to cream soups and mashed potatoes.  Eggs Have hard-cooked eggs readily available in the refrigerator. Chop and add to salads, casseroles, soups, and vegetables. Make a quick egg salad. All eggs should be well cooked to avoid the risk of harmful bacteria.  Meats, poultry, and fish Add leftover cooked meats to soups, casseroles, salads, and omelets. Make dip by mixing diced, chopped, or shredded meat with sour cream and spices.  Beans, legumes, nuts, and seeds  Sprinkle nuts and seeds on cereals, fruit, and desserts such as ice cream, pudding, and custard. Also serve nuts and seeds on vegetables, salads, and pasta. Spread peanut butter on toast, bread, English muffins, and fruit, or blend it in a milk shake. Add beans and peas to salads, soups, casseroles, and vegetable dishes.  High-Calorie Foods Butter, margarine, and  oils Melt butter or margarine over potatoes, rice, pasta, and cooked vegetables. Add melted butter or margarine into soups and casseroles and spread on bread for sandwiches before spreading sandwich spread or peanut butter. Saut or stir-fry vegetables, meats, chicken and fish such as shrimp/scallops in olive or canola oil. A variety of oils add calories and can be used to [employer], chicken, or fish.  Milk products Add whipping cream to desserts, pancakes, waffles, fruit, and hot chocolate, and fold it into soups and casseroles. Add sour cream to baked potatoes and vegetables.  Salad dressing Use regular (not low-fat or diet) mayonnaise and salad dressing on sandwiches and in dips with vegetables and fruit.   Sweets Add jelly and honey to bread and crackers. Add jam to fruit and ice cream and as a topping over cake.   Copyright 2020  Academy of Nutrition and Dietetics. All rights reserved.  Information on my medicine - ELIQUIS (apixaban)  Why was Eliquis prescribed for you? Eliquis was prescribed to treat blood clots that may have been found in the veins of your legs (deep vein thrombosis) or in your lungs (pulmonary embolism) and to reduce the risk of them occurring again.  What do You need to know about Eliquis ? The dose is 5 mg  taken TWICE daily.  Eliquis may be taken with or without food.   Try to take the dose about the same time in the morning and in the evening. If you have difficulty swallowing the tablet whole please discuss with your pharmacist how to take the medication safely.  Take Eliquis exactly as prescribed  and DO NOT stop taking Eliquis without talking to the doctor who prescribed the medication.  Stopping may increase your risk of developing a new blood clot.  Refill your prescription before you run out.  After discharge, you should have regular check-up appointments with your healthcare provider that is prescribing your Eliquis.    What do you do if you miss a dose? If a dose of ELIQUIS is not taken at the scheduled time, take it as soon as possible on the same day and twice-daily administration should be resumed. The dose should not be doubled to make up for a missed dose.  Important Safety Information A possible side effect of Eliquis is bleeding. You should call your healthcare provider right away if you experience any of the following: ? Bleeding from an injury or your nose that does not stop. ? Unusual colored urine (red or  dark brown) or unusual colored stools (red or black). ? Unusual bruising for unknown reasons. ? A serious fall or if you hit your head (even if there is no bleeding).  Some medicines may interact with Eliquis and might increase your risk of bleeding or clotting while on Eliquis. To help avoid this, consult your healthcare provider or pharmacist prior to using any new prescription or non-prescription medications, including herbals, vitamins, non-steroidal anti-inflammatory drugs (NSAIDs) and supplements.  This website has more information on Eliquis (apixaban): http://www.eliquis.com/eliquis/home

## 2020-09-21 DIAGNOSIS — Z515 Encounter for palliative care: Secondary | ICD-10-CM | POA: Diagnosis not present

## 2020-09-21 DIAGNOSIS — Z720 Tobacco use: Secondary | ICD-10-CM | POA: Diagnosis not present

## 2020-09-21 DIAGNOSIS — Z7189 Other specified counseling: Secondary | ICD-10-CM | POA: Diagnosis not present

## 2020-09-21 DIAGNOSIS — R627 Adult failure to thrive: Secondary | ICD-10-CM | POA: Diagnosis not present

## 2020-09-21 DIAGNOSIS — E43 Unspecified severe protein-calorie malnutrition: Secondary | ICD-10-CM | POA: Diagnosis not present

## 2020-09-21 DIAGNOSIS — I10 Essential (primary) hypertension: Secondary | ICD-10-CM | POA: Diagnosis not present

## 2020-09-21 LAB — CBC
HCT: 27.5 % — ABNORMAL LOW (ref 36.0–46.0)
Hemoglobin: 8.8 g/dL — ABNORMAL LOW (ref 12.0–15.0)
MCH: 26 pg (ref 26.0–34.0)
MCHC: 32 g/dL (ref 30.0–36.0)
MCV: 81.1 fL (ref 80.0–100.0)
Platelets: 145 10*3/uL — ABNORMAL LOW (ref 150–400)
RBC: 3.39 MIL/uL — ABNORMAL LOW (ref 3.87–5.11)
RDW: 17.2 % — ABNORMAL HIGH (ref 11.5–15.5)
WBC: 8.7 10*3/uL (ref 4.0–10.5)
nRBC: 0 % (ref 0.0–0.2)

## 2020-09-21 LAB — MAGNESIUM: Magnesium: 2 mg/dL (ref 1.7–2.4)

## 2020-09-21 NOTE — TOC Progression Note (Signed)
Transition of Care [facility]) - Progression Note    Patient Details  Name: Jennifer [name] MRN: [id number] Date of Birth: [birth year]  [facility]) CM/SW [place], [place] Phone Number: 09/21/2020, 4:27 PM  Clinical Narrative:   CSW met with patient to discuss bed offers, but patient asked CSW to call and talk with her sister Benjamine Mola. CSW spoke with Benjamine Mola and provided bed offers. Oneil Behney asked about options in the [facility] area so that family didn't have to travel as far, and asked to get a response from [facility]. CSW reached out to [facility], and they have no beds available. CSW reached out to [facility], and they are reviewing but concerned about what the plan is after rehab as the patient does not have a great prognosis. CSW to discuss with the patient and family about planning for long term care and follow for SNF choice.    Expected Discharge Plan: [place] Barriers to Discharge: Continued Medical Work up,Insurance Authorization  Expected Discharge Plan and Services Expected Discharge Plan: [place] In-house Referral: Clinical Social Work Discharge Planning Services: CM Consult   Living arrangements for the past 2 months: [place] (town house)                                       Social Determinants of Health ([place]) Interventions    Readmission Risk Interventions No flowsheet data found.

## 2020-09-21 NOTE — Progress Notes (Signed)
Physical Therapy Treatment Patient Details Name: Jennifer [name] MRN: [id number] DOB: 10/03/1940 Today's Date: 09/21/2020    History of Present Illness Pt is a 80 y.o. F who presents with weakess, frequent falls, and confusion. CT head negative for acute abnormality. MRIMRI brain showed multifocal infarcts including cortical and subcortical areas of both cerebral and cerebellar hemispheres.Patient has a CT chest that showed new 1 cm lateral LUL nodule, ascending aortic aneurysm measuring 4.4 cm. MRI brain with contrast with punctate multifocal enhancements which could be related to infarct or metastasis.  LE [place] positive for left distal DVT. Significant PMH: HTN, osteoporosis, L1 compression fx s/p kyphoplasty.    PT Comments    Pt verbally agreeable to getting up. Rolled over in bed before sitting up but was unable to complete task due to gravitational insecurity and the feeling that she was going to fall off the bed. Max A +2 given for assist with the roll but also for security. Once partially over, came to long sitting and pivoted to EOB with max A +2.  Pt dizzy with initial sitting and unable to achieve standing on first attempt. Pt pivoted to recliner with mod A +2, had difficulty stepping feet, could not progress ambulation at this time. Pt assisted with breakfast set up and was beginning to eat end of session. PT will continue to follow.   Follow Up Recommendations  SNF;Supervision/Assistance - 24 hour     Equipment Recommendations  Other (comment) (TBD)    Recommendations for Other Services       Precautions / Restrictions Precautions Precautions: Fall Restrictions Weight Bearing Restrictions: No    Mobility  Bed Mobility Overal bed mobility: Needs Assistance Bed Mobility: Rolling;Supine to Sit Rolling: Max assist;+2 for physical assistance   Supine to sit: Max assist;+2 for physical assistance     General bed mobility comments: pt lying on L side, rolled to R to come  out on R side of bed, pt fearful with rolling, support given physically on both sides partially to help her feel safe. Pt did not tolerate full roll to R but initiated sitting up to long sitting. Max A to complete task and pivot to edge of bed.    Transfers Overall transfer level: Needs assistance Equipment used: None Transfers: Sit to/from [employer] Sit to Stand: Mod assist;+2 physical assistance Stand pivot transfers: Mod assist;+2 physical assistance       General transfer comment: pt dizzy with initial stand and had to return to sitting. Performed SPT with therapist in front of pt for added support and +2 from side to guide hips. Pt had difficulty stepping feet and maintained trunk flexion  Ambulation/Gait             General Gait Details: pt unable to step feet well enough today to ambulate   Stairs             Wheelchair Mobility    Modified Rankin (Stroke Patients Only) Modified Rankin (Stroke Patients Only) Pre-Morbid Rankin Score: Moderately severe disability Modified Rankin: Severe disability     Balance Overall balance assessment: Needs assistance Sitting-balance support: No upper extremity supported;Feet supported Sitting balance-Leahy Scale: Fair Sitting balance - Comments: posterior lean   Standing balance support: Bilateral upper extremity supported Standing balance-Leahy Scale: Poor Standing balance comment: needs mod/ max support                            Cognition Arousal/Alertness: Awake/alert  Behavior During Therapy: Flat affect Overall Cognitive Status: No family/caregiver present to determine baseline cognitive functioning Area of Impairment: Following commands;Safety/judgement;Problem solving                     Memory: Decreased short-term memory Following Commands: Follows one step commands consistently;Follows multi-step commands inconsistently Safety/Judgement: Decreased awareness of  safety;Decreased awareness of deficits Awareness: Intellectual Problem Solving: Decreased initiation;Difficulty sequencing;Requires verbal cues;Requires tactile cues General Comments: pt following simple commands today      Exercises      General Comments General comments (skin integrity, edema, etc.): helped pt with breakfast set up and initiation of eating.      Pertinent Vitals/Pain Pain Assessment: No/denies pain    Home Living                      Prior Function            PT Goals (current goals can now be found in the care plan section) Acute Rehab PT Goals Patient Stated Goal: none stated PT Goal Formulation: Patient unable to participate in goal setting Time For Goal Achievement: 09/29/20 Potential to Achieve Goals: Fair Progress towards PT goals: Progressing toward goals    Frequency    Min 3X/week      PT Plan Current plan remains appropriate    Co-evaluation              AM-PAC PT "6 Clicks" Mobility   Outcome Measure  Help needed turning from your back to your side while in a flat bed without using bedrails?: A Lot Help needed moving from lying on your back to sitting on the side of a flat bed without using bedrails?: Total Help needed moving to and from a bed to a chair (including a wheelchair)?: Total Help needed standing up from a chair using your arms (e.g., wheelchair or bedside chair)?: Total Help needed to walk in hospital room?: Total Help needed climbing 3-5 steps with a railing? : Total 6 Click Score: 7    End of Session   Activity Tolerance: Patient limited by fatigue Patient left: with call bell/phone within reach;in chair;with chair alarm set Nurse Communication: Mobility status PT Visit Diagnosis: Other abnormalities of gait and mobility (R26.89);Muscle weakness (generalized) (M62.81)     Time: 5784-6962 PT Time Calculation (min) (ACUTE ONLY): 14 min  Charges:  $Therapeutic Activity: 8-22 mins                      Lyanne Co, PT  Acute Rehab Services  Pager [phone] Office [phone]    Jennifer [name] 09/21/2020, 11:02 AM

## 2020-09-21 NOTE — Progress Notes (Addendum)
PROGRESS NOTE  Jennifer BackersSusan L [name] ZOX:[id number] DOB: [birth year]   PCP: Pincus SanesBurns, Stacy J, MD  Patient is from: Home.  Lives alone.  DOA: 09/15/2020 LOS: 5  Chief complaints: Generalized weakness  Brief Narrative / Interim history: 80 year old F with PMH of PVD, HTN, HLD, tobacco abuse and depression presenting with generalized weakness, intermittent nausea and vomiting, poor appetite and unintentional weight loss for about 2 weeks, and admitted for FTT, depression with passive suicidally and AKI.  Patient was recently started on Lexapro.  MRI brain showed multifocal infarcts including cortical and subcortical areas of both cerebral and cerebellar hemispheres.  Patient is already outside tPA window.  Neurology consulted the next morning. TTE without significant finding.  CTA with widespread atherosclerosis with 65% stenosis of proximal left ICA.  Carotid US with 40 to 59% left ICA stenosis.  LE US positive for distal DVT. Changed DAPT to Eliquis for anticoagulation.   Patient has a CT chest that showed new 1 cm lateral LUL nodule, ascending aortic aneurysm measuring 4.4 cm, partially visualized large AAA (chronic) and a large genital hernia. IR consulted for image guided biopsy of LUL nodule but recommended outpatient PET scan.  MRI brain with contrast with punctate multifocal enhancements which could be related to infarct or metastasis.  Follow-up MRI with contrast in approximately 4 weeks recommended.   Therapy recommended SNF.  Unfortunately, she is not close to her goal caloric intake yet.  Patient and family not interested in tube feed.  Palliative medicine following.  Subjective: Seen and examined earlier this afternoon.  No major events overnight of this morning.  She feels "great".  She is sitting in bed eating lunch.  No complaints.  She denies pain, shortness of breath, GI, UTI or neuro symptoms.  Objective: Vitals:   09/21/20 0343 09/21/20 0748 09/21/20 1152 09/21/20 1152  BP: (!)  165/79 (!) 162/76 122/69 122/69  Pulse: 77 80 70 70  Resp: 16 16 20 20   Temp: 98 F (36.7 C)  97.9 F (36.6 C) 97.9 F (36.6 C)  TempSrc:   Oral Oral  SpO2: 100% 97% 99% 99%  Weight:        Intake/Output Summary (Last 24 hours) at 09/21/2020 1532 Last data filed at 09/21/2020 1342 Gross per 24 hour  Intake 300 ml  Output --  Net 300 ml   Filed Weights   09/16/20 0017 09/17/20 0432 09/19/20 0700  Weight: 37.6 kg 36.9 kg 40 kg    Examination:  GENERAL: Frail looking elderly female.  No apparent distress. HEENT: MMM.  Vision and hearing grossly intact.  NECK: Supple.  No apparent JVD.  RESP: On RA.  No IWOB.  Fair aeration bilaterally. CVS:  RRR. Heart sounds normal.  ABD/GI/GU: BS+. Abd soft, NTND.  MSK/EXT:  Moves extremities.  Significant muscle mass and subcu fat loss. SKIN: no apparent skin lesion or wound NEURO: Awake and alert. Oriented appropriately.  No apparent focal neuro deficit. PSYCH: Calm. Normal affect.  Procedures:  None  Microbiology summarized: COVID-19 and influenza PCR nonreactive.  Assessment & Plan: Multifocal bilateral cerebral and cerebellar embolic stroke-noted on MRI brain. LE US positive for left distal DVT.  No focal neurodeficit but generalized weakness.  She is already outside tPA window.  TTE without significant finding or PFO.  CTA with 65% left ICA stenosis but 40 to 59% on carotid US -On Eliquis 5 mg twice daily. Won't do starter pack due to risk of bleeding -Continue statin -Therapy recommended SNF.  Failure to thrive  in adult/history of anorexia nervosa- seems to be psychogenic but CVA, possible lung malignancy and cognitive decline could be contributing as well. TSH and CK within normal.  Improving. -Liberated diet-upright for meals given large hiatal hernia -Agree with Marinol for appetite -Slight improvement in p.o. intake but not quite close to goal.  RD following. Addendum -Patient and family not interested in tube feed.  Will  revise goal of care in the next 24 to 48 hours  Major depression with passive suicidal ideation: bright affect for the last 2 days -Appreciate input by psychiatry  -Stopped Lexapro out of concern for nausea and vomiting  -Continue low-dose Seroquel -Psychiatry signed off.  LLE DVT-LE [place] with left peroneal and gastrocnemius DVT. -Started on Eliquis.  AKI/azotemia: Relatively stable. Recent Labs    12/09/19 1433 07/05/20 1436 09/07/20 1504 09/15/20 1109 09/16/20 0333 09/17/20 0355 09/18/20 0129 09/19/20 0247 09/20/20 0404  BUN 22 14 13  33* 26* 15 19 18  24*  CREATININE 0.89 0.97 1.01 1.47* 1.12* 0.99 1.17* 0.92 1.13*  -Continue monitoring  Normocytic anemia: H&H relatively stable. Recent Labs    12/09/19 1433 07/05/20 1436 09/07/20 1504 09/15/20 1057 09/16/20 0333 09/17/20 0355 09/18/20 0129 09/19/20 0247 09/20/20 0404 09/21/20 0239  HGB 10.7 Repeated and verified X2.* 10.4* 10.7* 11.9* 9.0* 9.0* 8.1* 8.4* 9.0* 8.8*  -Check anemia panel in the morning  Hypokalemia/hypomagnesemia/hypophosphatemia: Resolved  Essential hypertension: Normotensive for most part. -Continue holding home amlodipine  LUL nodule concerning for malignancy given patient's heavy smoking history-about 1 cm LUL nodule seen on CT chest. MRI brain with contrast with punctate multifocal enhancements which could be related to infarct or metastasis.   -Follow-up MRI with contrast in approximately 4 weeks recommended.  -IR consulted for image guided biopsy but felt to be high risk and recommended outpatient PT.  Ascending and abdominal aortic aneurysm/large AAA-not a surgical candidate. -Optimize BP  -Continue annual imaging  Emphysema/pulmonary scarring-likely from heavy smoking. -No respiratory symptoms  E. coli bacteriuria-patient denies UTI symptoms.  No leukocytosis but mild temp to 99.8 on 5/26.  -Continue Keflex given her presentation  HLD -Continue Lipitor  PVD -On Eliquis.  High  risk for bleeding to add antiplatelets  Tobacco use disorder -Encourage cessation -Nicotine patch  Thrombocytopenia: Likely due to malnutrition.  Resolving. Recent Labs  Lab 09/15/20 1057 09/16/20 0333 09/17/20 0355 09/18/20 0129 09/19/20 0247 09/20/20 0404 09/21/20 0239  PLT 98* 72* 89* 82* 89* 127* 145*  -Continue monitoring  Goal of care-significant comorbidities as above.  Guarded prognosis but early to tell her response to treatment.  DNR/DNI appropriate.  -Palliative medicine following.  Severe malnutrition/FTT-decreased appetite with unintentional weight loss. Body mass index is 15.14 kg/m. Nutrition Problem: Severe Malnutrition Etiology: chronic illness (anorexia nervosa) Signs/Symptoms: energy intake < or equal to 75% for > or equal to 1 month,severe fat depletion,severe muscle depletion,percent weight loss Percent weight loss: 11 % (over 6 months) Interventions: Ensure Enlive (each supplement provides 350kcal and 20 grams of protein),Magic cup,Prostat,MVI,Calorie Count,Education   DVT prophylaxis:  On Eliquis for DVT  Code Status: DNR/DNI Family Communication: Updated patient's sister over the phone  Level of care: Telemetry Medical  Status is: Inpatient  Remains inpatient appropriate because:Unsafe d/c plan, Inpatient level of care appropriate due to severity of illness and Poor oral intake.  Patient is not close to his goal caloric intake   Dispo: The patient is from: Home              Anticipated d/c is to: SNF  Patient currently is not medically stable to d/c.   Difficult to place patient No   Consultants:  Neurology Palliative medicine Psychiatry-signed off   Sch Meds:  Scheduled Meds: . (feeding supplement) PROSource Plus  30 mL Oral BID BM  . apixaban  5 mg Oral BID  . atorvastatin  40 mg Oral Daily  . cephALEXin  500 mg Oral Q8H  . docusate sodium  100 mg Oral BID  . dronabinol  2.5 mg Oral BID AC  . feeding supplement  237  mL Oral TID BM  . LORazepam  0.5 mg Intravenous Once  . multivitamin with minerals  1 tablet Oral Daily  . pantoprazole  40 mg Oral Daily  . QUEtiapine  25 mg Oral QHS  . sodium chloride flush  3 mL Intravenous Q12H   Continuous Infusions:  PRN Meds:.acetaminophen **OR** acetaminophen, albuterol, bisacodyl, HYDROcodone-acetaminophen, morphine injection, nicotine, ondansetron **OR** ondansetron (ZOFRAN) IV, polyethylene glycol  Antimicrobials: Anti-infectives (From admission, onward)   Start     Dose/Rate Route Frequency Ordered Stop   09/20/20 0600  cephALEXin (KEFLEX) capsule 500 mg        500 mg Oral Every 8 hours 09/19/20 1316 09/24/20 0559   09/19/20 0900  cefTRIAXone (ROCEPHIN) 1 g in sodium chloride 0.9 % 100 mL IVPB  Status:  Discontinued        1 g 200 mL/hr over 30 Minutes Intravenous Every 24 hours 09/19/20 0811 09/19/20 1316       I have personally reviewed the following labs and images: CBC: Recent Labs  Lab 09/17/20 0355 09/18/20 0129 09/19/20 0247 09/20/20 0404 09/21/20 0239  WBC 8.7 8.2 7.2 9.7 8.7  HGB 9.0* 8.1* 8.4* 9.0* 8.8*  HCT 27.6* 24.9* 25.8* 28.2* 27.5*  MCV 78.6* 79.6* 79.4* 81.3 81.1  PLT 89* 82* 89* 127* 145*   BMP &GFR Recent Labs  Lab 09/16/20 0333 09/17/20 0355 09/18/20 0129 09/19/20 0247 09/20/20 0404 09/21/20 0239  NA 138 136 136 134* 139  --   K 2.7* 2.8* 3.5 3.9 4.1  --   CL 107 104 100 100 106  --   CO2 22 22 25 24 25   --   GLUCOSE 93 93 118* 98 102*  --   BUN 26* 15 19 18  24*  --   CREATININE 1.12* 0.99 1.17* 0.92 1.13*  --   CALCIUM 8.2* 8.1* 8.3* 7.7* 8.7*  --   MG 1.8 1.6* 2.1 1.8 1.9 2.0  PHOS  --  2.4* 1.7* 4.4 3.5  --    Estimated Creatinine Clearance: 24.2 mL/min (A) (by C-G formula based on SCr of 1.13 mg/dL (H)). Liver & Pancreas: Recent Labs  Lab 09/15/20 1109 09/17/20 0355 09/18/20 0129 09/19/20 0247 09/20/20 0404  AST 24  --   --   --   --   ALT 8  --   --   --   --   ALKPHOS 71  --   --   --   --    BILITOT 0.9  --   --   --   --   PROT 7.7  --   --   --   --   ALBUMIN 3.4* 2.7* 2.4* 2.5* 2.6*   No results for input(s): LIPASE, AMYLASE in the last 168 hours. No results for input(s): AMMONIA in the last 168 hours. Diabetic: No results for input(s): HGBA1C in the last 72 hours. Recent Labs  Lab 09/15/20 1617 09/16/20 2202 09/17/20 0106 09/17/20 0428  GLUCAP  95 71 87 91   Cardiac Enzymes: Recent Labs  Lab 09/16/20 0333 09/17/20 0355  CKTOTAL 111 133   No results for input(s): PROBNP in the last 8760 hours. Coagulation Profile: No results for input(s): INR, PROTIME in the last 168 hours. Thyroid Function Tests: No results for input(s): TSH, T4TOTAL, FREET4, T3FREE, THYROIDAB in the last 72 hours. Lipid Profile: No results for input(s): CHOL, HDL, LDLCALC, TRIG, CHOLHDL, LDLDIRECT in the last 72 hours. Anemia Panel: No results for input(s): VITAMINB12, FOLATE, FERRITIN, TIBC, IRON, RETICCTPCT in the last 72 hours. Urine analysis:    Component Value Date/Time   COLORURINE YELLOW 09/16/2020 1522   APPEARANCEUR HAZY (A) 09/16/2020 1522   LABSPEC 1.018 09/16/2020 1522   PHURINE 7.0 09/16/2020 1522   GLUCOSEU NEGATIVE 09/16/2020 1522   GLUCOSEU NEGATIVE 04/21/2015 1135   HGBUR LARGE (A) 09/16/2020 1522   BILIRUBINUR NEGATIVE 09/16/2020 1522   KETONESUR NEGATIVE 09/16/2020 1522   PROTEINUR NEGATIVE 09/16/2020 1522   UROBILINOGEN 0.2 04/21/2015 1135   NITRITE NEGATIVE 09/16/2020 1522   LEUKOCYTESUR TRACE (A) 09/16/2020 1522   Sepsis Labs: Invalid input(s): PROCALCITONIN, LACTICIDVEN  Microbiology: Recent Results (from the past 240 hour(s))  Resp Panel by RT-PCR (Flu A&B, Covid) Nasopharyngeal Swab     Status: None   Collection Time: 09/15/20  1:07 PM   Specimen: Nasopharyngeal Swab; Nasopharyngeal(NP) swabs in vial transport medium  Result Value Ref Range Status   SARS Coronavirus 2 by RT PCR NEGATIVE NEGATIVE Final    Comment: (NOTE) SARS-CoV-2 target nucleic  acids are NOT DETECTED.  The SARS-CoV-2 RNA is generally detectable in upper respiratory specimens during the acute phase of infection. The lowest concentration of SARS-CoV-2 viral copies this assay can detect is 138 copies/mL. A negative result does not preclude SARS-Cov-2 infection and should not be used as the sole basis for treatment or other patient management decisions. A negative result may occur with  improper specimen collection/handling, submission of specimen other than nasopharyngeal swab, presence of viral mutation(s) within the areas targeted by this assay, and inadequate number of viral copies(<138 copies/mL). A negative result must be combined with clinical observations, patient history, and epidemiological information. The expected result is Negative.  Fact Sheet for Patients:  BloggerCourse.com  Fact Sheet for Healthcare Providers:  SeriousBroker.it  This test is no t yet approved or cleared by the [place] FDA and  has been authorized for detection and/or diagnosis of SARS-CoV-2 by FDA under an Emergency Use Authorization (EUA). This EUA will remain  in effect (meaning this test can be used) for the duration of the COVID-19 declaration under Section 564(b)(1) of the Act, 21 U.S.C.section 360bbb-3(b)(1), unless the authorization is terminated  or revoked sooner.       Influenza A by PCR NEGATIVE NEGATIVE Final   Influenza B by PCR NEGATIVE NEGATIVE Final    Comment: (NOTE) The Xpert Xpress SARS-CoV-2/FLU/RSV plus assay is intended as an aid in the diagnosis of influenza from Nasopharyngeal swab specimens and should not be used as a sole basis for treatment. Nasal washings and aspirates are unacceptable for Xpert Xpress SARS-CoV-2/FLU/RSV testing.  Fact Sheet for Patients: BloggerCourse.com  Fact Sheet for Healthcare Providers: SeriousBroker.it  This  test is not yet approved or cleared by the [place] FDA and has been authorized for detection and/or diagnosis of SARS-CoV-2 by FDA under an Emergency Use Authorization (EUA). This EUA will remain in effect (meaning this test can be used) for the duration of the COVID-19 declaration under Section 564(b)(1)  of the Act, 21 U.S.C. section 360bbb-3(b)(1), unless the authorization is terminated or revoked.  Performed at [facility], [address], [place], [place] [address]   Culture, Urine     Status: Abnormal   Collection Time: 09/16/20  3:22 PM   Specimen: Urine, Random  Result Value Ref Range Status   Specimen Description URINE, RANDOM  Final   Special Requests   Final    NONE Performed at [facility], [address], [place], [place] [address]    Culture >=100,000 COLONIES/mL ESCHERICHIA COLI (A)  Final   Report Status 09/19/2020 FINAL  Final   Organism ID, Bacteria ESCHERICHIA COLI (A)  Final      Susceptibility   Escherichia coli - MIC*    AMPICILLIN <=2 SENSITIVE Sensitive     CEFAZOLIN <=4 SENSITIVE Sensitive     CEFEPIME <=0.12 SENSITIVE Sensitive     CEFTRIAXONE <=0.25 SENSITIVE Sensitive     CIPROFLOXACIN <=0.25 SENSITIVE Sensitive     GENTAMICIN <=1 SENSITIVE Sensitive     IMIPENEM <=0.25 SENSITIVE Sensitive     NITROFURANTOIN <=16 SENSITIVE Sensitive     TRIMETH/SULFA <=20 SENSITIVE Sensitive     AMPICILLIN/SULBACTAM <=2 SENSITIVE Sensitive     PIP/TAZO <=4 SENSITIVE Sensitive     * >=100,000 COLONIES/mL ESCHERICHIA COLI    Radiology Studies: No results found.    Geraldine Tesar T. Karista Aispuro [facility]  If 7PM-7AM, please contact night-coverage [email or web address] 09/21/2020, 3:32 PM

## 2020-09-21 NOTE — Progress Notes (Signed)
Palliative Medicine Inpatient Follow Up Note  Reason for consult:  Goals of Care  HPI:  Per intake H&P --> 80 year old F with PMH of PVD, HTN, HLDanddepression presenting with generalized weakness, intermittent nausea and vomiting, poor appetite and unintentional weight loss for about 2 weeks, andadmitted for FTT, depression with passivesuicidally andAKI.Identified by MRI to have multifocal bilateral cerebral and cerebellar stroke(s).  Palliative care has been asked to get involved in the setting of ongoing failure to thrive to further address and discuss goals of care.  Today's Discussion (09/21/2020):  *Please note that this is a verbal dictation therefore any spelling or grammatical errors are due to the "[place]" system interpretation.  Chart reviewed.  I met at bedside this morning with Jennifer [name] and her nurse, Jennifer [name].  Jennifer [name] had been gotten up to the chair and had her breakfast in front of her.  She was eating grits.  Jennifer [name] denied any complaints this morning.  We reviewed the plan for her to ideally go to rehab to gain strength and improve her condition of weakness.  We discussed that if she feels overburdened by any treatments provided at any time we can always change her focus to more of a symptom based approach.  Called patient's sister, Jennifer [name] this morning.  We reviewed the plan to continue current modalities of care and treat what is treatable.  Jennifer [name] feels that pursuing a needle biopsy would likely not be of benefit as Jennifer [name] could not tolerate the chemotherapy that would possibly be offered if it is something malignant.  Reviewed as above the plan to see if Jennifer [name] can make improvements and if not the idea of her possibly enrolling in hospice care.  Questions and concerns addressed   Objective Assessment: Vital Signs Vitals:   09/21/20 0343 09/21/20 0748  BP: (!) 165/79 (!) 162/76  Pulse: 77 80  Resp: 16 16  Temp: 98 F (36.7 C)   SpO2: 100% 97%     Intake/Output Summary (Last 24 hours) at 09/21/2020 1128 Last data filed at 09/21/2020 1036 Gross per 24 hour  Intake 390 ml  Output --  Net 390 ml   Last Weight  Most recent update: 09/19/2020  7:07 AM   Weight  40 kg (88 lb 2.9 oz)           Gen:  Elderly F in NAD HEENT: moist mucous membranes CV: Regular rate and rhythm  PULM: clear to auscultation bilaterally  ABD: soft/nontender  EXT: No edema  Neuro: Alert and oriented x2-3  SUMMARY OF RECOMMENDATIONS DNAR/DNI  MOST Completed, paper copy placed onto the chart electric copy can be found in [place]  DNR Form Completed, paper copy placed onto the chart electric copy can be found in [place]  Continue time Trial of treatments (Started 5/27 -  ), patient is responding well  Per review of therapy notes skilled nursing is recommended  Would benefit from OP Palliative support -this has been ordered  Patient and her family know if she should continue to decline then the next best step would be consideration of hospice care  Palliative care will continue to incrementally follow though goals at this time are clear  Symptom Management: Failure to Thrive:                 - Dietician involved                 - Calorie Count                 -  Encourage 1:1 feeding                 - Supplemental nutrients                 - Will trial Marinol 2.48m PO BID  Depression:                 -Appreciate psychiatry insights                 -Continue Lexapro  (+) UA, E.Coli:  - CTX per primary  Time Spent: 35 Greater than 50% of the time was spent in counseling and coordination of care ______________________________________________________________________________________ MS[facility]opeTeam Team Cell Phone: [phone] utilize secure chat with additional questions, if there is no response within 30 minutes please call the above phone number  Palliative Medicine Team providers  are available by phone from 7am to 7pm daily and can be reached through the team cell phone.  Should this patient require assistance outside of these hours, please call the patient's attending physician.

## 2020-09-21 NOTE — Care Management Important Message (Signed)
Important Message  Patient Details  Name: Jennifer [name] MRN: [id number] Date of Birth: April 26, 1940   Medicare Important Message Given:  Yes     Buryl Bamber Stefan Church 09/21/2020, 3:44 PM

## 2020-09-21 NOTE — Progress Notes (Signed)
Nutrition Follow-up  DOCUMENTATION CODES:   Severe malnutrition in context of chronic illness  INTERVENTION:   Discontinue calorie count (results from weekend below)  Continue Ensure Enlive po TID, each supplement provides 350 kcal and 20 grams of protein  Continue Magic cup TID with meals, each supplement provides 290 kcal and 9 grams of protein  Continue 10ml Prosource Plus po BID, each supplement provides 100 kcals and 15 grams of protein  Continue MVI with minerals daily  NUTRITION DIAGNOSIS:   Severe Malnutrition related to chronic illness (anorexia nervosa) as evidenced by energy intake < or equal to 75% for > or equal to 1 month,severe fat depletion,severe muscle depletion,percent weight loss. -- ongoing   GOAL:   Patient will meet greater than or equal to 90% of their needs - progressing  MONITOR:   PO intake,Supplement acceptance,Labs,Weight trends,I & O's  REASON FOR ASSESSMENT:   Consult Calorie Count,Assessment of nutrition requirement/status  ASSESSMENT:   Pt admitted with FTT, depression with passive SI, and AKI. Pt identified by MRI to have multifocal bilateral cerebral and cerebellar stroke(s). PMH includes PVD, HTN, HLD, and depression.   Calorie count conducted over the weekend, results can be seen below. Please note that pt's po intake has been inadequate to meet her nutritional needs; however, pt has indicated that she does not desire a feeding tube. Will continue to provide pt with oral nutrition supplements as appropriate. Staff should encourage adequate intake and assist pt as needed with meals/supplements/snacks.   Diet: regular Supplements: ensure TID, magic cup TID, prosource plus BID, MVI  Day 1 Results (09/17/20): Breakfast: 0 kcals, 0g protein Lunch: 0 kcals, 0g protein Dinner: 0 kcals, 0g protein Supplements: 350 kcals, 20g protein  Day 1 total intake: 350 kcal (27% of minimum estimated needs)  20g protein (30% of minimum estimated  needs)  Day 2 Results (09/18/20): Breakfast: 255 kcals, 14g protein Lunch: 0 kcals, 0g protein Dinner: 415 kcals, 11g protein Supplements: 29 kcals, 1g protein  Day 2 total intake: 699 kcal (53% of minimum estimated needs)  26g protein (40% of minimum estimated needs)  Day 3 Results (09/19/20): Breakfast: no meal documentation available Lunch: no meal documentation available Dinner: 13 kcals, 1g protein Supplements: 290 kcals, 9g protein  Day 3 total intake: 303 kcal (23% of minimum estimated needs)  10g protein (15% of minimum estimated needs)  Medications: . (feeding supplement) PROSource Plus  30 mL Oral BID BM  . apixaban  5 mg Oral BID  . atorvastatin  40 mg Oral Daily  . cephALEXin  500 mg Oral Q8H  . docusate sodium  100 mg Oral BID  . dronabinol  2.5 mg Oral BID AC  . feeding supplement  237 mL Oral TID BM  . LORazepam  0.5 mg Intravenous Once  . multivitamin with minerals  1 tablet Oral Daily  . pantoprazole  40 mg Oral Daily  . QUEtiapine  25 mg Oral QHS  . sodium chloride flush  3 mL Intravenous Q12H   Labs:  Recent Labs  Lab 09/18/20 0129 09/19/20 0247 09/20/20 0404 09/21/20 0239  NA 136 134* 139  --   K 3.5 3.9 4.1  --   CL 100 100 106  --   CO2 25 24 25   --   BUN 19 18 24*  --   CREATININE 1.17* 0.92 1.13*  --   CALCIUM 8.3* 7.7* 8.7*  --   MG 2.1 1.8 1.9 2.0  PHOS 1.7* 4.4 3.5  --  GLUCOSE 118* 98 102*  --     Diet Order:   Diet Order            Diet regular Room service appropriate? Yes; Fluid consistency: Thin  Diet effective now                 EDUCATION NEEDS:   Education needs have been addressed  Skin:  Skin Assessment: Reviewed RN Assessment  Last BM:  5/29  Height:   Ht Readings from Last 1 Encounters:  09/07/20 5\' 4"  (1.626 m)    Weight:   Wt Readings from Last 10 Encounters:  09/19/20 40 kg  09/07/20 38.6 kg  07/05/20 42.2 kg  04/01/20 40.6 kg  03/03/20 41.6 kg  12/08/19 38.8 kg  08/04/19 39.9 kg   07/05/19 42.2 kg  07/02/19 42.2 kg  07/02/19 42.2 kg   BMI:  Body mass index is 15.14 kg/m.  Estimated Nutritional Needs:   Kcal:  1300-1500  Protein:  65-75g  Fluid:  >1.3L/d    09/01/19, MS, RD, LDN RD pager number and weekend/on-call pager number located in Amion.

## 2020-09-22 DIAGNOSIS — Z720 Tobacco use: Secondary | ICD-10-CM | POA: Diagnosis not present

## 2020-09-22 DIAGNOSIS — R627 Adult failure to thrive: Secondary | ICD-10-CM | POA: Diagnosis not present

## 2020-09-22 DIAGNOSIS — I10 Essential (primary) hypertension: Secondary | ICD-10-CM | POA: Diagnosis not present

## 2020-09-22 DIAGNOSIS — E43 Unspecified severe protein-calorie malnutrition: Secondary | ICD-10-CM | POA: Diagnosis not present

## 2020-09-22 LAB — RENAL FUNCTION PANEL
Albumin: 2.7 g/dL — ABNORMAL LOW (ref 3.5–5.0)
Anion gap: 13 (ref 5–15)
BUN: 46 mg/dL — ABNORMAL HIGH (ref 8–23)
CO2: 26 mmol/L (ref 22–32)
Calcium: 9.4 mg/dL (ref 8.9–10.3)
Chloride: 104 mmol/L (ref 98–111)
Creatinine, Ser: 1.16 mg/dL — ABNORMAL HIGH (ref 0.44–1.00)
GFR, Estimated: 47 mL/min — ABNORMAL LOW (ref >60)
Glucose, Bld: 112 mg/dL — ABNORMAL HIGH (ref 70–99)
Phosphorus: 3.8 mg/dL (ref 2.5–4.6)
Potassium: 3.9 mmol/L (ref 3.5–5.1)
Sodium: 143 mmol/L (ref 135–145)

## 2020-09-22 LAB — MAGNESIUM: Magnesium: 2.2 mg/dL (ref 1.7–2.4)

## 2020-09-22 LAB — HEMOGLOBIN AND HEMATOCRIT, BLOOD
HCT: 27.9 % — ABNORMAL LOW (ref 36.0–46.0)
Hemoglobin: 8.9 g/dL — ABNORMAL LOW (ref 12.0–15.0)

## 2020-09-22 NOTE — Progress Notes (Signed)
PROGRESS NOTE  Jennifer [name] DPO:[id number] DOB: December 14, 1940   PCP: Pincus Sanes, MD  Patient is from: Home.  Lives alone.  DOA: 09/15/2020 LOS: 6  Chief complaints: Generalized weakness  Brief Narrative / Interim history: 80 year old F with PMH of PVD, HTN, HLD, tobacco abuse and depression presenting with generalized weakness, intermittent nausea and vomiting, poor appetite and unintentional weight loss for about 2 weeks, and admitted for FTT, depression with passive suicidally and AKI.  Patient was recently started on Lexapro.  MRI brain showed multifocal infarcts including cortical and subcortical areas of both cerebral and cerebellar hemispheres.  Patient is already outside tPA window.  Neurology consulted the next morning. TTE without significant finding.  CTA with widespread atherosclerosis with 65% stenosis of proximal left ICA.  Carotid US with 40 to 59% left ICA stenosis.  LE [place] positive for distal DVT. Changed DAPT to Eliquis for anticoagulation.   Patient has a CT chest that showed new 1 cm lateral LUL nodule, ascending aortic aneurysm measuring 4.4 cm, partially visualized large AAA (chronic) and a large genital hernia. IR consulted for image guided biopsy of LUL nodule but recommended outpatient PET scan.  MRI brain with contrast with punctate multifocal enhancements which could be related to infarct or metastasis.  Follow-up MRI with contrast in approximately 4 weeks recommended.   Therapy recommended SNF.  Unfortunately, she is not close to her goal caloric intake yet.  Patient and family not interested in tube feed.  Palliative medicine following.  Subjective: Seen and examined earlier this morning.  No major events overnight of this morning.  No complaints.  Seems to have eaten fair amount of her breakfast this morning.  She would like to work on a lunch as well.  She does not want tube feed.  She is not happy about going to SNF but she knows that she can't be home by  herself.  She says she will talk to her sister  Objective: Vitals:   09/21/20 2356 09/22/20 0331 09/22/20 0739 09/22/20 1147  BP: 133/82 (!) 146/67 136/60 109/60  Pulse: 83 79 72 71  Resp: 18 18 20 16   Temp: 98 F (36.7 C) 98 F (36.7 C) 98.6 F (37 C) 98.2 F (36.8 C)  TempSrc:  Oral Oral Oral  SpO2: 94% 97% 100% 94%  Weight:       No intake or output data in the 24 hours ending 09/22/20 1428 Filed Weights   09/16/20 0017 09/17/20 0432 09/19/20 0700  Weight: 37.6 kg 36.9 kg 40 kg    Examination:  GENERAL: Frail looking elderly female.  No apparent distress. HEENT: MMM.  Vision and hearing grossly intact.  NECK: Supple.  No apparent JVD.  RESP: On RA.  No IWOB.  Fair aeration bilaterally. CVS:  RRR. Heart sounds normal.  ABD/GI/GU: BS+. Abd soft, NTND.  MSK/EXT:  Moves extremities.  Significant muscle mass and subcu fat loss. SKIN: no apparent skin lesion or wound NEURO: Awake and alert. Oriented appropriately.  No apparent focal neuro deficit. PSYCH: Calm. Normal affect.   Procedures:  None  Microbiology summarized: COVID-19 and influenza PCR nonreactive.  Assessment & Plan: Multifocal bilateral cerebral and cerebellar embolic stroke-noted on MRI brain. LE 09/21/20 positive for left distal DVT.  No focal neurodeficit but generalized weakness.  She is already outside tPA window.  TTE without significant finding or PFO.  CTA with 65% left ICA stenosis but 40 to 59% on carotid US -On Eliquis 5 mg twice daily. Won't  do starter pack due to risk of bleeding -Continue statin -Therapy recommended SNF.  Failure to thrive in adult/history of anorexia nervosa- seems to be psychogenic but CVA, possible lung malignancy and cognitive decline could be contributing as well. TSH and CK within normal.  Improving. -Liberated diet-upright for meals given large hiatal hernia -Agree with Marinol for appetite -Slight improvement in p.o. intake but not quite close to goal.  RD  following. -Patient and family not interested in tube feed.   Major depression with passive suicidal ideation: bright affect for the last 2 days -Appreciate input by psychiatry  -Stopped Lexapro out of concern for nausea and vomiting  -Continue low-dose Seroquel -Psychiatry signed off.  LLE DVT-LE US with left peroneal and gastrocnemius DVT. -Started on Eliquis.  AKI/azotemia: Cr and BUN slightly up today.  I suspect this is due to poor p.o. intake Recent Labs    12/09/19 1433 07/05/20 1436 09/07/20 1504 09/15/20 1109 09/16/20 0333 09/17/20 0355 09/18/20 0129 09/19/20 0247 09/20/20 0404 09/22/20 0319  BUN 22 14 13  33* 26* 15 19 18  24* 46*  CREATININE 0.89 0.97 1.01 1.47* 1.12* 0.99 1.17* 0.92 1.13* 1.16*  -Continue monitoring  Normocytic anemia: H&H relatively stable. Recent Labs    07/05/20 1436 09/07/20 1504 09/15/20 1057 09/16/20 0333 09/17/20 0355 09/18/20 0129 09/19/20 0247 09/20/20 0404 09/21/20 0239 09/22/20 0319  HGB 10.4* 10.7* 11.9* 9.0* 9.0* 8.1* 8.4* 9.0* 8.8* 8.9*  -Check anemia panel in the morning  Hypokalemia/hypomagnesemia/hypophosphatemia: Resolved  Essential hypertension: Normotensive for most part. -Continue holding home amlodipine  LUL nodule concerning for malignancy given patient's heavy smoking history-about 1 cm LUL nodule seen on CT chest. MRI brain with contrast with punctate multifocal enhancements which could be related to infarct or metastasis.   -Follow-up MRI with contrast in approximately 4 weeks recommended.  -IR consulted for image guided biopsy but felt to be high risk and recommended outpatient PT.  Ascending and abdominal aortic aneurysm/large AAA-not a surgical candidate. -Optimize BP  -Continue annual imaging  Emphysema/pulmonary scarring-likely from heavy smoking. -No respiratory symptoms  E. coli bacteriuria-patient denies UTI symptoms.  No leukocytosis but mild temp to 99.8 on 5/26.  -Continue Keflex given her  presentation  HLD -Continue Lipitor  PVD -On Eliquis.  High risk for bleeding to add antiplatelets  Tobacco use disorder -Encourage cessation -Nicotine patch  Thrombocytopenia: Likely due to malnutrition.  Resolving. Recent Labs  Lab 09/16/20 0333 09/17/20 0355 09/18/20 0129 09/19/20 0247 09/20/20 0404 09/21/20 0239  PLT 72* 89* 82* 89* 127* 145*  -Continue monitoring  Goal of care-guarded prognosis.  DNR/DNI appropriate.  -Palliative medicine following.  Severe malnutrition/FTT-decreased appetite with unintentional weight loss. Body mass index is 15.14 kg/m. Nutrition Problem: Severe Malnutrition Etiology: chronic illness (anorexia nervosa) Signs/Symptoms: energy intake < or equal to 75% for > or equal to 1 month,severe fat depletion,severe muscle depletion,percent weight loss Percent weight loss: 11 % (over 6 months) Interventions: Ensure Enlive (each supplement provides 350kcal and 20 grams of protein),Magic cup,Prostat,MVI,Calorie Count,Education   DVT prophylaxis:  On Eliquis for DVT  Code Status: DNR/DNI Family Communication: Updated patient's sister over the phone on 5/31.  Level of care: Telemetry Medical  Status is: Inpatient  Remains inpatient appropriate because:Unsafe d/c plan, Inpatient level of care appropriate due to severity of illness and Poor oral intake.  Patient is not close to his goal caloric intake   Dispo: The patient is from: Home              Anticipated  d/c is to: SNF              Patient currently is not medically stable to d/c.   Difficult to place patient No   Consultants:  Neurology Palliative medicine Psychiatry-signed off   Sch Meds:  Scheduled Meds: . (feeding supplement) PROSource Plus  30 mL Oral BID BM  . apixaban  5 mg Oral BID  . atorvastatin  40 mg Oral Daily  . cephALEXin  500 mg Oral Q8H  . docusate sodium  100 mg Oral BID  . dronabinol  2.5 mg Oral BID AC  . feeding supplement  237 mL Oral TID BM  .  LORazepam  0.5 mg Intravenous Once  . multivitamin with minerals  1 tablet Oral Daily  . pantoprazole  40 mg Oral Daily  . QUEtiapine  25 mg Oral QHS  . sodium chloride flush  3 mL Intravenous Q12H   Continuous Infusions:  PRN Meds:.acetaminophen **OR** acetaminophen, albuterol, bisacodyl, HYDROcodone-acetaminophen, morphine injection, nicotine, ondansetron **OR** ondansetron (ZOFRAN) IV, polyethylene glycol  Antimicrobials: Anti-infectives (From admission, onward)   Start     Dose/Rate Route Frequency Ordered Stop   09/20/20 0600  cephALEXin (KEFLEX) capsule 500 mg        500 mg Oral Every 8 hours 09/19/20 1316 09/24/20 0559   09/19/20 0900  cefTRIAXone (ROCEPHIN) 1 g in sodium chloride 0.9 % 100 mL IVPB  Status:  Discontinued        1 g 200 mL/hr over 30 Minutes Intravenous Every 24 hours 09/19/20 0811 09/19/20 1316       I have personally reviewed the following labs and images: CBC: Recent Labs  Lab 09/17/20 0355 09/18/20 0129 09/19/20 0247 09/20/20 0404 09/21/20 0239 09/22/20 0319  WBC 8.7 8.2 7.2 9.7 8.7  --   HGB 9.0* 8.1* 8.4* 9.0* 8.8* 8.9*  HCT 27.6* 24.9* 25.8* 28.2* 27.5* 27.9*  MCV 78.6* 79.6* 79.4* 81.3 81.1  --   PLT 89* 82* 89* 127* 145*  --    BMP &GFR Recent Labs  Lab 09/17/20 0355 09/18/20 0129 09/19/20 0247 09/20/20 0404 09/21/20 0239 09/22/20 0319  NA 136 136 134* 139  --  143  K 2.8* 3.5 3.9 4.1  --  3.9  CL 104 100 100 106  --  104  CO2 22 25 24 25   --  26  GLUCOSE 93 118* 98 102*  --  112*  BUN 15 19 18  24*  --  46*  CREATININE 0.99 1.17* 0.92 1.13*  --  1.16*  CALCIUM 8.1* 8.3* 7.7* 8.7*  --  9.4  MG 1.6* 2.1 1.8 1.9 2.0 2.2  PHOS 2.4* 1.7* 4.4 3.5  --  3.8   Estimated Creatinine Clearance: 23.6 mL/min (A) (by C-G formula based on SCr of 1.16 mg/dL (H)). Liver & Pancreas: Recent Labs  Lab 09/17/20 0355 09/18/20 0129 09/19/20 0247 09/20/20 0404 09/22/20 0319  ALBUMIN 2.7* 2.4* 2.5* 2.6* 2.7*   No results for input(s): LIPASE,  AMYLASE in the last 168 hours. No results for input(s): AMMONIA in the last 168 hours. Diabetic: No results for input(s): HGBA1C in the last 72 hours. Recent Labs  Lab 09/15/20 1617 09/16/20 2202 09/17/20 0106 09/17/20 0428  GLUCAP 95 71 87 91   Cardiac Enzymes: Recent Labs  Lab 09/16/20 0333 09/17/20 0355  CKTOTAL 111 133   No results for input(s): PROBNP in the last 8760 hours. Coagulation Profile: No results for input(s): INR, PROTIME in the last 168 hours. Thyroid Function  Tests: No results for input(s): TSH, T4TOTAL, FREET4, T3FREE, THYROIDAB in the last 72 hours. Lipid Profile: No results for input(s): CHOL, HDL, LDLCALC, TRIG, CHOLHDL, LDLDIRECT in the last 72 hours. Anemia Panel: No results for input(s): VITAMINB12, FOLATE, FERRITIN, TIBC, IRON, RETICCTPCT in the last 72 hours. Urine analysis:    Component Value Date/Time   COLORURINE YELLOW 09/16/2020 1522   APPEARANCEUR HAZY (A) 09/16/2020 1522   LABSPEC 1.018 09/16/2020 1522   PHURINE 7.0 09/16/2020 1522   GLUCOSEU NEGATIVE 09/16/2020 1522   GLUCOSEU NEGATIVE 04/21/2015 1135   HGBUR LARGE (A) 09/16/2020 1522   BILIRUBINUR NEGATIVE 09/16/2020 1522   KETONESUR NEGATIVE 09/16/2020 1522   PROTEINUR NEGATIVE 09/16/2020 1522   UROBILINOGEN 0.2 04/21/2015 1135   NITRITE NEGATIVE 09/16/2020 1522   LEUKOCYTESUR TRACE (A) 09/16/2020 1522   Sepsis Labs: Invalid input(s): PROCALCITONIN, LACTICIDVEN  Microbiology: Recent Results (from the past 240 hour(s))  Resp Panel by RT-PCR (Flu A&B, Covid) Nasopharyngeal Swab     Status: None   Collection Time: 09/15/20  1:07 PM   Specimen: Nasopharyngeal Swab; Nasopharyngeal(NP) swabs in vial transport medium  Result Value Ref Range Status   SARS Coronavirus 2 by RT PCR NEGATIVE NEGATIVE Final    Comment: (NOTE) SARS-CoV-2 target nucleic acids are NOT DETECTED.  The SARS-CoV-2 RNA is generally detectable in upper respiratory specimens during the acute phase of infection.  The lowest concentration of SARS-CoV-2 viral copies this assay can detect is 138 copies/mL. A negative result does not preclude SARS-Cov-2 infection and should not be used as the sole basis for treatment or other patient management decisions. A negative result may occur with  improper specimen collection/handling, submission of specimen other than nasopharyngeal swab, presence of viral mutation(s) within the areas targeted by this assay, and inadequate number of viral copies(<138 copies/mL). A negative result must be combined with clinical observations, patient history, and epidemiological information. The expected result is Negative.  Fact Sheet for Patients:  BloggerCourse.com  Fact Sheet for Healthcare Providers:  SeriousBroker.it  This test is no t yet approved or cleared by the [place] FDA and  has been authorized for detection and/or diagnosis of SARS-CoV-2 by FDA under an Emergency Use Authorization (EUA). This EUA will remain  in effect (meaning this test can be used) for the duration of the COVID-19 declaration under Section 564(b)(1) of the Act, 21 U.S.C.section 360bbb-3(b)(1), unless the authorization is terminated  or revoked sooner.       Influenza A by PCR NEGATIVE NEGATIVE Final   Influenza B by PCR NEGATIVE NEGATIVE Final    Comment: (NOTE) The Xpert Xpress SARS-CoV-2/FLU/RSV plus assay is intended as an aid in the diagnosis of influenza from Nasopharyngeal swab specimens and should not be used as a sole basis for treatment. Nasal washings and aspirates are unacceptable for Xpert Xpress SARS-CoV-2/FLU/RSV testing.  Fact Sheet for Patients: BloggerCourse.com  Fact Sheet for Healthcare Providers: SeriousBroker.it  This test is not yet approved or cleared by the [place] FDA and has been authorized for detection and/or diagnosis of SARS-CoV-2 by FDA  under an Emergency Use Authorization (EUA). This EUA will remain in effect (meaning this test can be used) for the duration of the COVID-19 declaration under Section 564(b)(1) of the Act, 21 U.S.C. section 360bbb-3(b)(1), unless the authorization is terminated or revoked.  Performed at [facility], [address] [place], [place] [address]   Culture, Urine     Status: Abnormal   Collection Time: 09/16/20  3:22 PM  Specimen: Urine, Random  Result Value Ref Range Status   Specimen Description URINE, RANDOM  Final   Special Requests   Final    NONE Performed at [facility], [address], [place], [place] [address]    Culture >=100,000 COLONIES/mL ESCHERICHIA COLI (A)  Final   Report Status 09/19/2020 FINAL  Final   Organism ID, Bacteria ESCHERICHIA COLI (A)  Final      Susceptibility   Escherichia coli - MIC*    AMPICILLIN <=2 SENSITIVE Sensitive     CEFAZOLIN <=4 SENSITIVE Sensitive     CEFEPIME <=0.12 SENSITIVE Sensitive     CEFTRIAXONE <=0.25 SENSITIVE Sensitive     CIPROFLOXACIN <=0.25 SENSITIVE Sensitive     GENTAMICIN <=1 SENSITIVE Sensitive     IMIPENEM <=0.25 SENSITIVE Sensitive     NITROFURANTOIN <=16 SENSITIVE Sensitive     TRIMETH/SULFA <=20 SENSITIVE Sensitive     AMPICILLIN/SULBACTAM <=2 SENSITIVE Sensitive     PIP/TAZO <=4 SENSITIVE Sensitive     * >=100,000 COLONIES/mL ESCHERICHIA COLI    Radiology Studies: No results found.    Zoelle Markus T. Rona Tomson [facility]  If 7PM-7AM, please contact night-coverage [email or web address] 09/22/2020, 2:28 PM

## 2020-09-22 NOTE — Progress Notes (Signed)
PT Cancellation Note  Patient Details Name: Jennifer [name] MRN: [id number] DOB: 02/11/1941   Cancelled Treatment:    Reason Eval/Treat Not Completed: Patient declined, no reason specified. Patient declined getting oob for breakfast at this time. Will re-attempt later as time allows.    [name] 09/22/2020, 9:31 AM

## 2020-09-22 NOTE — TOC Progression Note (Signed)
Transition of Care [facility]) - Progression Note    Patient Details  Name: Jennifer [name] MRN: [id number] Date of Birth: 10/28/1940  Transition of Care [facility]) CM/SW Contact  Baldemar Lenis, [place] Phone Number: 09/22/2020, 11:28 AM  Clinical Narrative:   CSW spoke with patient's sister, Lanora Manis, about what the plan is after SNF. Lanora Manis discussed how she spoke with MD yesterday and doesn't understand why patient would go to rehab if she won't get any benefit from it, that she doesn't really see her coming back from this and it might be time for hospice. Lanora Manis said the plan is to watch for any additional improvement for the next day or so, and then decide on what the plan is after that, but she doesn't really see any benefit in being aggressive with care at this point. Lanora Manis is on her way up to the hospital to spend the day with the patient, and discussed how we will follow up tomorrow on disposition. CSW to follow.    Expected Discharge Plan: Skilled Nursing Facility Barriers to Discharge: Continued Medical Work up,Insurance Authorization  Expected Discharge Plan and Services Expected Discharge Plan: Skilled Nursing Facility In-house Referral: Clinical Social Work Discharge Planning Services: CM Consult   Living arrangements for the past 2 months: Single Family Home (town house)                                       Social Determinants of Health (SDOH) Interventions    Readmission Risk Interventions No flowsheet data found.

## 2020-09-23 DIAGNOSIS — R918 Other nonspecific abnormal finding of lung field: Secondary | ICD-10-CM | POA: Diagnosis not present

## 2020-09-23 DIAGNOSIS — Z720 Tobacco use: Secondary | ICD-10-CM | POA: Diagnosis not present

## 2020-09-23 DIAGNOSIS — R627 Adult failure to thrive: Secondary | ICD-10-CM | POA: Diagnosis not present

## 2020-09-23 DIAGNOSIS — I10 Essential (primary) hypertension: Secondary | ICD-10-CM | POA: Diagnosis not present

## 2020-09-23 LAB — RENAL FUNCTION PANEL
Albumin: 2.8 g/dL — ABNORMAL LOW (ref 3.5–5.0)
Anion gap: 10 (ref 5–15)
BUN: 45 mg/dL — ABNORMAL HIGH (ref 8–23)
CO2: 30 mmol/L (ref 22–32)
Calcium: 9.5 mg/dL (ref 8.9–10.3)
Chloride: 104 mmol/L (ref 98–111)
Creatinine, Ser: 1.16 mg/dL — ABNORMAL HIGH (ref 0.44–1.00)
GFR, Estimated: 47 mL/min — ABNORMAL LOW (ref >60)
Glucose, Bld: 114 mg/dL — ABNORMAL HIGH (ref 70–99)
Phosphorus: 4.8 mg/dL — ABNORMAL HIGH (ref 2.5–4.6)
Potassium: 4.1 mmol/L (ref 3.5–5.1)
Sodium: 144 mmol/L (ref 135–145)

## 2020-09-23 LAB — RETICULOCYTES
Immature Retic Fract: 21.2 % — ABNORMAL HIGH (ref 2.3–15.9)
RBC.: 3.42 MIL/uL — ABNORMAL LOW (ref 3.87–5.11)
Retic Count, Absolute: 56.4 10*3/uL (ref 19.0–186.0)
Retic Ct Pct: 1.7 % (ref 0.4–3.1)

## 2020-09-23 LAB — CBC
HCT: 28.4 % — ABNORMAL LOW (ref 36.0–46.0)
Hemoglobin: 8.9 g/dL — ABNORMAL LOW (ref 12.0–15.0)
MCH: 25.6 pg — ABNORMAL LOW (ref 26.0–34.0)
MCHC: 31.3 g/dL (ref 30.0–36.0)
MCV: 81.8 fL (ref 80.0–100.0)
Platelets: 204 10*3/uL (ref 150–400)
RBC: 3.47 MIL/uL — ABNORMAL LOW (ref 3.87–5.11)
RDW: 18.1 % — ABNORMAL HIGH (ref 11.5–15.5)
WBC: 9.5 10*3/uL (ref 4.0–10.5)
nRBC: 0 % (ref 0.0–0.2)

## 2020-09-23 LAB — IRON AND TIBC
Iron: 32 ug/dL (ref 28–170)
Saturation Ratios: 14 % (ref 10.4–31.8)
TIBC: 234 ug/dL — ABNORMAL LOW (ref 250–450)
UIBC: 202 ug/dL

## 2020-09-23 LAB — FERRITIN: Ferritin: 102 ng/mL (ref 11–307)

## 2020-09-23 LAB — FOLATE: Folate: 19.9 ng/mL (ref >5.9)

## 2020-09-23 LAB — VITAMIN B12: Vitamin B-12: 520 pg/mL (ref 180–914)

## 2020-09-23 LAB — MAGNESIUM: Magnesium: 2.5 mg/dL — ABNORMAL HIGH (ref 1.7–2.4)

## 2020-09-23 MED ORDER — OLANZAPINE 2.5 MG PO TABS
2.5000 mg | ORAL_TABLET | Freq: Every day | ORAL | Status: DC
  Administered 2020-09-23 – 2020-09-28 (×6): 2.5 mg via ORAL
  Filled 2020-09-23 (×6): qty 1

## 2020-09-23 NOTE — Progress Notes (Signed)
  Speech Language Pathology Treatment: Cognitive-Linquistic  Patient Details Name: Jennifer [name] MRN: [id number] DOB: 03-29-1941 Today's Date: 09/23/2020 Time: 0102-7253 SLP Time Calculation (min) (ACUTE ONLY): 14 min  Assessment / Plan / Recommendation Clinical Impression  Per notes, family has expressed interest in transitioning to [facility].  Pt was seen today to address cognitive goals.  She was pleasant and communicative, assisted with items from meal tray.  Required mod verbal cues to attend to environmental cues in room to orient herself to elements of time and place.  Moderate verbal cues needed to sustain attention to self-feeding.  Speech was fully intelligible and comprehension of basic instructions was intact.  Given plan to transition to hospice facility and functional communication for basic wants and needs, no further acute care SLP f/u is warranted.  Our service will sign off.   HPI HPI: 80 y.o. female with medical history significant of PVD; HTN; and HLD presenting with weakness on 09/15/20.  She has been feeling terribly.  She woke up this AM and felt terrible, had no energy.  She felt ok yesterday.  She was tired and weak.  She had a very bad night - she was moaning a lot and calling out and wanting to get out of bed.  She was also incontinent.  She has a lot of back problems.  On a usual day she is able to get up, go to [employer] to check out books, go to the grocery.  She last went to [employer] maybe a week ago and grocery shopped maybe 2 weeks ago.  She hasn't had much of an appetite for the last 2 weeks but "she's never a good eater."  She has lost maybe 10 pounds over maybe a couple of weeks.  She had lost 8 pounds between her last 2 PCP visits.  Intermittent nausea with vomiting; emesis yesterday x 1.  Mild SOB when running errands.  Family has noticed some memory issues recently over the last weeks, moreso in the last few days.  Family took her home with them on Monday  night due to concerns.  Family has noticed that she stopped eating more than a week ago.  She acknowledges depressed mood, passive suicidal ideation; 09/19/20 MRI brain indicated Postcontrast imaging performed due to left upper lobe mass seen on  recent CT chest. There is punctate multifocal enhancement at many of  the sites of restricted diffusion seen on recent MRI head. These  areas of enhancement could relate to enhancement of acute/subacute  infarcts or metastases. A follow-up MRI with contrast in  approximately 4 weeks is recommended to further evaluate; SLE (speech/language evaluation) partially completed on 09/18/20 d/t noncompliance/refusal.      SLP Plan  Continue with current plan of care       Recommendations                   Oral Care Recommendations: Oral care BID Follow up Recommendations: Skilled Nursing facility SLP Visit Diagnosis: Cognitive communication deficit (G64.403) Plan: Continue with current plan of care       GO               Liadan Guizar L. Samson Frederic, MA CCC/SLP Acute Rehabilitation Services Office number [phone] Pager [phone]  Blenda Mounts [name] 09/23/2020, 1:23 PM

## 2020-09-23 NOTE — Progress Notes (Signed)
Occupational Therapy Treatment Patient Details Name: Jennifer [name] MRN: [id number] DOB: 01/07/1941 Today's Date: 09/23/2020    History of present illness Pt is a 80 y.o. F who presents with weakess, frequent falls, and confusion. CT head negative for acute abnormality. MRIMRI brain showed multifocal infarcts including cortical and subcortical areas of both cerebral and cerebellar hemispheres.Patient has a CT chest that showed new 1 cm lateral LUL nodule, ascending aortic aneurysm measuring 4.4 cm. MRI brain with contrast with punctate multifocal enhancements which could be related to infarct or metastasis.  LE [place] positive for left distal DVT. Significant PMH: HTN, osteoporosis, L1 compression fx s/p kyphoplasty.   OT comments  Patient with decent progress toward patient focused OT goals.  She is moving better, needing up to Min A for basic mobility and transfers.  Decreased safety, and persistent back pain noted.  Setup, and up to Min A for sit/stand grooming task.  Back pain limits participation.  OT to continue to follow in the acute setting, SNF continues to be recommended given the level of assist needed, and insufficient help at home.    Follow Up Recommendations  SNF;Supervision/Assistance - 24 hour    Equipment Recommendations       Recommendations for Other Services      Precautions / Restrictions Precautions Precautions: Fall Restrictions Weight Bearing Restrictions: No Other Position/Activity Restrictions: chronic back pain       Mobility Bed Mobility               General bed mobility comments: lying in the recliner    Transfers Overall transfer level: Needs assistance   Transfers: Sit to/from Stand Sit to Stand: Min assist         General transfer comment: less dizziness expressed    Balance Overall balance assessment: Needs assistance Sitting-balance support: Feet supported Sitting balance-Leahy Scale: Fair     Standing balance support: Bilateral  upper extremity supported Standing balance-Leahy Scale: Poor Standing balance comment: needs external support                           ADL either performed or assessed with clinical judgement   ADL Overall ADL's : Needs assistance/impaired     Grooming: Minimal assistance;Sitting;Standing;Wash/dry hands;Wash/dry face;Oral care;Brushing hair               Lower Body Dressing: Moderate assistance;Sit to/from stand               Functional mobility during ADLs: Rolling walker;Cueing for safety;Cueing for sequencing;Minimal assistance                         Cognition Arousal/Alertness: Awake/alert Behavior During Therapy: WFL for tasks assessed/performed                     Orientation Level: Disoriented to;Time;Situation   Memory: Decreased short-term memory Following Commands: Follows one step commands consistently     Problem Solving: Requires verbal cues          Exercises     Shoulder Instructions       General Comments      Pertinent Vitals/ Pain       Pain Assessment: Faces Faces Pain Scale: Hurts whole lot Pain Location: back Pain Descriptors / Indicators: Aching;Grimacing;Guarding;Radiating Pain Intervention(s): Monitored during session  Frequency  Min 2X/week        Progress Toward Goals  OT Goals(current goals can now be found in the care plan section)  Progress towards OT goals: Progressing toward goals  Acute Rehab OT Goals Patient Stated Goal: hoping to return home OT Goal Formulation: With patient Time For Goal Achievement: 09/30/20 Potential to Achieve Goals: Good  Plan Discharge plan remains appropriate    Co-evaluation                 AM-PAC OT "6 Clicks" Daily Activity     Outcome Measure   Help from another person eating meals?: A Little Help from another person taking care of personal grooming?: A  Little Help from another person toileting, which includes using toliet, bedpan, or urinal?: A Lot Help from another person bathing (including washing, rinsing, drying)?: A Lot Help from another person to put on and taking off regular upper body clothing?: A Little Help from another person to put on and taking off regular lower body clothing?: A Lot 6 Click Score: 15    End of Session Equipment Utilized During Treatment: Gait belt;Rolling walker  OT Visit Diagnosis: Other abnormalities of gait and mobility (R26.89);Unsteadiness on feet (R26.81);History of falling (Z91.81);Muscle weakness (generalized) (M62.81);Other symptoms and signs involving cognitive function   Activity Tolerance Patient limited by pain   Patient Left in bed;with call bell/phone within reach;with bed alarm set   Nurse Communication Mobility status        Time: 1000-1012 OT Time Calculation (min): 12 min  Charges: OT General Charges $OT Visit: 1 Visit OT Treatments $Self Care/Home Management : 8-22 mins  09/23/2020  Rich, OTR/L  Acute Rehabilitation Services  Office:  [phone]    Suzanna Obey 09/23/2020, 10:26 AM

## 2020-09-23 NOTE — Progress Notes (Signed)
[facility] ([facility])  Hospital Liaison: RN note         This patient has been referred to our palliative care services in the community.  [facility] will continue to follow for any discharge planning needs and to coordinate continuation of palliative care in the outpatient setting.  In her discharge papers, please place order for palliative to follow at the facility she discharges to.    If you have questions or need assistance, please call [phone] or contact the hospital Liaison listed on AMION.      Thank you for this referral.         Mary Anne Robertson, RN, CCM  [facility] Hospital Liaison   [phone] 

## 2020-09-23 NOTE — Plan of Care (Signed)
Patient remains withdrawn and needs constant encouragement to eat and get out of bed for meals.    Problem: Education: Goal: Knowledge of General Education information will improve Description: Including pain rating scale, medication(s)/side effects and non-pharmacologic comfort measures Outcome: Not Progressing   Problem: Health Behavior/Discharge Planning: Goal: Ability to manage health-related needs will improve Outcome: Not Progressing   Problem: Clinical Measurements: Goal: Ability to maintain clinical measurements within normal limits will improve Outcome: Not Progressing Goal: Will remain free from infection Outcome: Progressing Goal: Diagnostic test results will improve Outcome: Not Progressing Goal: Respiratory complications will improve Outcome: Progressing Goal: Cardiovascular complication will be avoided Outcome: Progressing   Problem: Activity: Goal: Risk for activity intolerance will decrease Outcome: Not Progressing   Problem: Nutrition: Goal: Adequate nutrition will be maintained Outcome: Not Progressing   Problem: Coping: Goal: Level of anxiety will decrease Outcome: Progressing   Problem: Elimination: Goal: Will not experience complications related to bowel motility Outcome: Progressing Goal: Will not experience complications related to urinary retention Outcome: Progressing   Problem: Pain Managment: Goal: General experience of comfort will improve Outcome: Progressing   Problem: Safety: Goal: Ability to remain free from injury will improve Outcome: Progressing   Problem: Skin Integrity: Goal: Risk for impaired skin integrity will decrease Outcome: Progressing

## 2020-09-23 NOTE — Progress Notes (Signed)
PROGRESS NOTE  Jennifer BackersSusan L [name] NWG:[id number] DOB: Oct 20, 1940   PCP: Pincus SanesBurns, Stacy J, MD  Patient is from: Home.  Lives alone.  DOA: 09/15/2020 LOS: 7  Chief complaints: Generalized weakness  Brief Narrative / Interim history: 80 year old F with PMH of PVD, HTN, HLD, tobacco abuse and depression presenting with generalized weakness, intermittent nausea and vomiting, poor appetite and unintentional weight loss for about 2 weeks, and admitted for FTT, depression with passive suicidally and AKI.  Patient was recently started on Lexapro.  MRI brain showed multifocal infarcts including cortical and subcortical areas of both cerebral and cerebellar hemispheres.  Patient is already outside tPA window.  Neurology consulted the next morning. TTE without significant finding.  CTA with widespread atherosclerosis with 65% stenosis of proximal left ICA.  Carotid US with 40 to 59% left ICA stenosis.  LE US positive for distal DVT. Changed DAPT to Eliquis for anticoagulation.   Patient has a CT chest that showed new 1 cm lateral LUL nodule, ascending aortic aneurysm measuring 4.4 cm, partially visualized large AAA (chronic) and a large genital hernia. IR consulted for image guided biopsy of LUL nodule but recommended outpatient PET scan.  MRI brain with contrast with punctate multifocal enhancements which could be related to infarct or metastasis.  Follow-up MRI with contrast in approximately 4 weeks recommended.   Therapy recommended SNF.  Unfortunately, she is not close to her goal caloric intake yet.  Patient and family not interested in tube feed.  Palliative medicine following.  Subjective: Seen and examined earlier this morning.  No major events overnight or this morning.  Oral intake remains poor.  Again she is not interested in feeding tube.  Family asking if she qualifies for residential hospice.  TOC sent referral.   Objective: Vitals:   09/23/20 0015 09/23/20 0752 09/23/20 0900 09/23/20 1118   BP: 119/69 (!) 152/60  133/72  Pulse: 87 73  83  Resp: 16 17  18   Temp: 98.6 F (37 C) 97.8 F (36.6 C)  98.1 F (36.7 C)  TempSrc: Oral Oral  Oral  SpO2: 97% 96%  96%  Weight:   37.7 kg     Intake/Output Summary (Last 24 hours) at 09/23/2020 1155 Last data filed at 09/23/2020 0901 Gross per 24 hour  Intake 516 ml  Output 1 ml  Net 515 ml   Filed Weights   09/17/20 0432 09/19/20 0700 09/23/20 0900  Weight: 36.9 kg 40 kg 37.7 kg    Examination:  GENERAL: Frail looking elderly female.  No apparent distress.  Sitting on bedside chair. HEENT: MMM.  Vision and hearing grossly intact.  NECK: Supple.  No apparent JVD.  RESP: On RA.  No IWOB.  Fair aeration bilaterally. CVS:  RRR. Heart sounds normal.  ABD/GI/GU: BS+. Abd soft, NTND.  MSK/EXT:  Moves extremities.  Significant muscle mass and subcu fat loss. SKIN: no apparent skin lesion or wound NEURO: Awake and alert. Oriented appropriately.  No apparent focal neuro deficit. PSYCH: Calm. Normal affect.  Procedures:  None  Microbiology summarized: COVID-19 and influenza PCR nonreactive.  Assessment & Plan: Failure to thrive in adult/history of anorexia nervosa- seems to be psychogenic but CVA, possible lung malignancy and cognitive decline could be contributing as well. TSH and CK within normal.  Oral intake remains poor. Patient and family not interested in tube feed.  Weight somewhat stable from admission. -Liberated diet-upright for meals given large hiatal hernia -Agree with Marinol for appetite -Change Seroquel to olanzapine -RD and palliative  medicine following -Referral to residential hospice placed per family request  Major depression with passive suicidal ideation: bright affect for the last 2 days -Appreciate input by psychiatry  -Stopped Lexapro out of concern for nausea and vomiting  -Change Seroquel to olanzapine given poor appetite -Psychiatry signed off.  Multifocal bilateral cerebral and cerebellar  embolic stroke-noted on MRI brain. LE [place] positive for left distal DVT.  No focal neurodeficit but generalized weakness.  She is already outside tPA window.  TTE without significant finding or PFO.  CTA with 65% left ICA stenosis but 40 to 59% on carotid US -On Eliquis 5 mg twice daily.  -Continue statin  LLE DVT-LE [place] with left peroneal and gastrocnemius DVT. -Eliquis as above  AKI/azotemia: Cr and BUN slightly up today.  I suspect this is due to poor p.o. intake Recent Labs    07/05/20 1436 09/07/20 1504 09/15/20 1109 09/16/20 0333 09/17/20 0355 09/18/20 0129 09/19/20 0247 09/20/20 0404 09/22/20 0319 09/23/20 0229  BUN 14 13 33* 26* 15 19 18  24* 46* 45*  CREATININE 0.97 1.01 1.47* 1.12* 0.99 1.17* 0.92 1.13* 1.16* 1.16*  -Continue monitoring  Normocytic anemia: H&H relatively stable.  Anemia panel basically normal Recent Labs    09/07/20 1504 09/15/20 1057 09/16/20 0333 09/17/20 0355 09/18/20 0129 09/19/20 0247 09/20/20 0404 09/21/20 0239 09/22/20 0319 09/23/20 0229  HGB 10.7* 11.9* 9.0* 9.0* 8.1* 8.4* 9.0* 8.8* 8.9* 8.9*  -Monitor intermittently  Hypokalemia/hypomagnesemia/hypophosphatemia: Resolved  Essential hypertension: Normotensive for most part. -Continue holding home amlodipine  LUL nodule concerning for malignancy given patient's heavy smoking history-about 1 cm LUL nodule seen on CT chest. MRI brain with contrast with punctate multifocal enhancements which could be related to infarct or metastasis.   -Follow-up MRI with contrast in approximately 4 weeks recommended.  -IR consulted for image guided biopsy but felt to be high risk and recommended outpatient PT.  Ascending and abdominal aortic aneurysm/large AAA-not a surgical candidate. -Continue annual imaging  Emphysema/pulmonary scarring-likely from heavy smoking. -No respiratory symptoms  E. coli bacteriuria-patient denies UTI symptoms.  No leukocytosis but mild temp to 99.8 on 5/26.  -Completes  antibiotic course with Keflex on 6/2.  HLD -Continue Lipitor  PVD -On Eliquis.  High risk for bleeding to add antiplatelets  Tobacco use disorder -Encourage cessation -Nicotine patch  Thrombocytopenia: Resolved Recent Labs  Lab 09/17/20 0355 09/18/20 0129 09/19/20 0247 09/20/20 0404 09/21/20 0239 09/23/20 0229  PLT 89* 82* 89* 127* 145* 204    Goal of care-guarded prognosis especially with poor p.o. intake.  Patient and family not interested in tube feed.  Family expressed interest in [facility].  TOC placed referral.  -Palliative medicine following.  Severe malnutrition/FTT-decreased appetite with unintentional weight loss. Body mass index is 14.27 kg/m. Nutrition Problem: Severe Malnutrition Etiology: chronic illness (anorexia nervosa) Signs/Symptoms: energy intake < or equal to 75% for > or equal to 1 month,severe fat depletion,severe muscle depletion,percent weight loss Percent weight loss: 11 % (over 6 months) Interventions: Ensure Enlive (each supplement provides 350kcal and 20 grams of protein),Magic cup,Prostat,MVI,Calorie Count,Education   DVT prophylaxis:  On Eliquis for DVT  Code Status: DNR/DNI Family Communication: Updated patient's sister over the phone  Level of care: Telemetry Medical  Status is: Inpatient  Remains inpatient appropriate because:Unsafe d/c plan, Inpatient level of care appropriate due to severity of illness and Poor oral intake.  Patient is not close to his goal caloric intake   Dispo: The patient is from: Home  Anticipated d/c is to: To be determined              Patient currently is not medically stable to d/c.   Difficult to place patient No   Consultants:  Neurology-signed off Palliative medicine Psychiatry-signed off   Sch Meds:  Scheduled Meds: . (feeding supplement) PROSource Plus  30 mL Oral BID BM  . apixaban  5 mg Oral BID  . atorvastatin  40 mg Oral Daily  . cephALEXin  500 mg Oral Q8H  .  docusate sodium  100 mg Oral BID  . dronabinol  2.5 mg Oral BID AC  . feeding supplement  237 mL Oral TID BM  . multivitamin with minerals  1 tablet Oral Daily  . OLANZapine  2.5 mg Oral QHS  . pantoprazole  40 mg Oral Daily  . sodium chloride flush  3 mL Intravenous Q12H   Continuous Infusions:  PRN Meds:.acetaminophen **OR** acetaminophen, albuterol, bisacodyl, HYDROcodone-acetaminophen, morphine injection, nicotine, ondansetron **OR** ondansetron (ZOFRAN) IV, polyethylene glycol  Antimicrobials: Anti-infectives (From admission, onward)   Start     Dose/Rate Route Frequency Ordered Stop   09/20/20 0600  cephALEXin (KEFLEX) capsule 500 mg        500 mg Oral Every 8 hours 09/19/20 1316 09/24/20 0559   09/19/20 0900  cefTRIAXone (ROCEPHIN) 1 g in sodium chloride 0.9 % 100 mL IVPB  Status:  Discontinued        1 g 200 mL/hr over 30 Minutes Intravenous Every 24 hours 09/19/20 0811 09/19/20 1316       I have personally reviewed the following labs and images: CBC: Recent Labs  Lab 09/18/20 0129 09/19/20 0247 09/20/20 0404 09/21/20 0239 09/22/20 0319 09/23/20 0229  WBC 8.2 7.2 9.7 8.7  --  9.5  HGB 8.1* 8.4* 9.0* 8.8* 8.9* 8.9*  HCT 24.9* 25.8* 28.2* 27.5* 27.9* 28.4*  MCV 79.6* 79.4* 81.3 81.1  --  81.8  PLT 82* 89* 127* 145*  --  204   BMP &GFR Recent Labs  Lab 09/18/20 0129 09/19/20 0247 09/20/20 0404 09/21/20 0239 09/22/20 0319 09/23/20 0229  NA 136 134* 139  --  143 144  K 3.5 3.9 4.1  --  3.9 4.1  CL 100 100 106  --  104 104  CO2 --  26 30  GLUCOSE 118* 98 102*  --  112* 114*  BUN 19 18 24*  --  46* 45*  CREATININE 1.17* 0.92 1.13*  --  1.16* 1.16*  CALCIUM 8.3* 7.7* 8.7*  --  9.4 9.5  MG 2.1 1.8 1.9 2.0 2.2 2.5*  PHOS 1.7* 4.4 3.5  --  3.8 4.8*   Estimated Creatinine Clearance: 22.3 mL/min (A) (by C-G formula based on SCr of 1.16 mg/dL (H)). Liver & Pancreas: Recent Labs  Lab 09/18/20 0129 09/19/20 0247 09/20/20 0404 09/22/20 0319  09/23/20 0229  ALBUMIN 2.4* 2.5* 2.6* 2.7* 2.8*   No results for input(s): LIPASE, AMYLASE in the last 168 hours. No results for input(s): AMMONIA in the last 168 hours. Diabetic: No results for input(s): HGBA1C in the last 72 hours. Recent Labs  Lab 09/16/20 2202 09/17/20 0106 09/17/20 0428  GLUCAP 71 87 91   Cardiac Enzymes: Recent Labs  Lab 09/17/20 0355  CKTOTAL 133   No results for input(s): PROBNP in the last 8760 hours. Coagulation Profile: No results for input(s): INR, PROTIME in the last 168 hours. Thyroid Function Tests: No results for input(s): TSH, T4TOTAL, FREET4, T3FREE, THYROIDAB  in the last 72 hours. Lipid Profile: No results for input(s): CHOL, HDL, LDLCALC, TRIG, CHOLHDL, LDLDIRECT in the last 72 hours. Anemia Panel: Recent Labs    09/23/20 0229  VITAMINB12 520  FOLATE 19.9  FERRITIN 102  TIBC 234*  IRON 32  RETICCTPCT 1.7   Urine analysis:    Component Value Date/Time   COLORURINE YELLOW 09/16/2020 1522   APPEARANCEUR HAZY (A) 09/16/2020 1522   LABSPEC 1.018 09/16/2020 1522   PHURINE 7.0 09/16/2020 1522   GLUCOSEU NEGATIVE 09/16/2020 1522   GLUCOSEU NEGATIVE 04/21/2015 1135   HGBUR LARGE (A) 09/16/2020 1522   BILIRUBINUR NEGATIVE 09/16/2020 1522   KETONESUR NEGATIVE 09/16/2020 1522   PROTEINUR NEGATIVE 09/16/2020 1522   UROBILINOGEN 0.2 04/21/2015 1135   NITRITE NEGATIVE 09/16/2020 1522   LEUKOCYTESUR TRACE (A) 09/16/2020 1522   Sepsis Labs: Invalid input(s): PROCALCITONIN, LACTICIDVEN  Microbiology: Recent Results (from the past 240 hour(s))  Resp Panel by RT-PCR (Flu A&B, Covid) Nasopharyngeal Swab     Status: None   Collection Time: 09/15/20  1:07 PM   Specimen: Nasopharyngeal Swab; Nasopharyngeal(NP) swabs in vial transport medium  Result Value Ref Range Status   SARS Coronavirus 2 by RT PCR NEGATIVE NEGATIVE Final    Comment: (NOTE) SARS-CoV-2 target nucleic acids are NOT DETECTED.  The SARS-CoV-2 RNA is generally detectable  in upper respiratory specimens during the acute phase of infection. The lowest concentration of SARS-CoV-2 viral copies this assay can detect is 138 copies/mL. A negative result does not preclude SARS-Cov-2 infection and should not be used as the sole basis for treatment or other patient management decisions. A negative result may occur with  improper specimen collection/handling, submission of specimen other than nasopharyngeal swab, presence of viral mutation(s) within the areas targeted by this assay, and inadequate number of viral copies(<138 copies/mL). A negative result must be combined with clinical observations, patient history, and epidemiological information. The expected result is Negative.  Fact Sheet for Patients:  BloggerCourse.com  Fact Sheet for Healthcare Providers:  SeriousBroker.it  This test is no t yet approved or cleared by the [place] FDA and  has been authorized for detection and/or diagnosis of SARS-CoV-2 by FDA under an Emergency Use Authorization (EUA). This EUA will remain  in effect (meaning this test can be used) for the duration of the COVID-19 declaration under Section 564(b)(1) of the Act, 21 U.S.C.section 360bbb-3(b)(1), unless the authorization is terminated  or revoked sooner.       Influenza A by PCR NEGATIVE NEGATIVE Final   Influenza B by PCR NEGATIVE NEGATIVE Final    Comment: (NOTE) The Xpert Xpress SARS-CoV-2/FLU/RSV plus assay is intended as an aid in the diagnosis of influenza from Nasopharyngeal swab specimens and should not be used as a sole basis for treatment. Nasal washings and aspirates are unacceptable for Xpert Xpress SARS-CoV-2/FLU/RSV testing.  Fact Sheet for Patients: BloggerCourse.com  Fact Sheet for Healthcare Providers: SeriousBroker.it  This test is not yet approved or cleared by the [place] FDA and has  been authorized for detection and/or diagnosis of SARS-CoV-2 by FDA under an Emergency Use Authorization (EUA). This EUA will remain in effect (meaning this test can be used) for the duration of the COVID-19 declaration under Section 564(b)(1) of the Act, 21 U.S.C. section 360bbb-3(b)(1), unless the authorization is terminated or revoked.  Performed at [facility], [address] [place], [place] [address]   Culture, Urine     Status: Abnormal   Collection Time: 09/16/20  3:22  PM   Specimen: Urine, Random  Result Value Ref Range Status   Specimen Description URINE, RANDOM  Final   Special Requests   Final    NONE Performed at [facility], [address] [place], [place] [address]    Culture >=100,000 COLONIES/mL ESCHERICHIA COLI (A)  Final   Report Status 09/19/2020 FINAL  Final   Organism ID, Bacteria ESCHERICHIA COLI (A)  Final      Susceptibility   Escherichia coli - MIC*    AMPICILLIN <=2 SENSITIVE Sensitive     CEFAZOLIN <=4 SENSITIVE Sensitive     CEFEPIME <=0.12 SENSITIVE Sensitive     CEFTRIAXONE <=0.25 SENSITIVE Sensitive     CIPROFLOXACIN <=0.25 SENSITIVE Sensitive     GENTAMICIN <=1 SENSITIVE Sensitive     IMIPENEM <=0.25 SENSITIVE Sensitive     NITROFURANTOIN <=16 SENSITIVE Sensitive     TRIMETH/SULFA <=20 SENSITIVE Sensitive     AMPICILLIN/SULBACTAM <=2 SENSITIVE Sensitive     PIP/TAZO <=4 SENSITIVE Sensitive     * >=100,000 COLONIES/mL ESCHERICHIA COLI    Radiology Studies: No results found.    Margaretha Mahan T. Chaze Hruska [facility]  If 7PM-7AM, please contact night-coverage [email or web address] 09/23/2020, 11:55 AM

## 2020-09-23 NOTE — TOC Progression Note (Signed)
Transition of Care [facility]) - Progression Note    Patient Details  Name: Jennifer [name] MRN: [id number] Date of Birth: [birth year]  Transition of Care [facility]) CM/SW Contact  Baldemar Lenis, [place] Phone Number: 09/23/2020, 10:09 AM  Clinical Narrative:   CSW received phone call from patient's sister, Lanora Manis, after hours last night that the family has been talking and they would like to transition to comfort care and hospice, interested in [facility] if possible. CSW updated MD this morning, he will contact palliative and follow up. CSW sent referral to [facility] for review of hospice eligibility. CSW spoke with patient's sister, Lanora Manis, to update that referral was placed and we will be contacting them.    Expected Discharge Plan: Hospice Medical Facility Barriers to Discharge: Continued Medical Work up,Insurance Authorization  Expected Discharge Plan and Services Expected Discharge Plan: Hospice Medical Facility In-house Referral: Clinical Social Work Discharge Planning Services: CM Consult   Living arrangements for the past 2 months: Single Family Home (town house)                                       Social Determinants of Health (SDOH) Interventions    Readmission Risk Interventions No flowsheet data found.

## 2020-09-23 NOTE — Progress Notes (Signed)
Physical Therapy Treatment Patient Details Name: Jennifer [name] MRN: [id number] DOB: 07-27-1940 Today's Date: 09/23/2020    History of Present Illness Pt is a 80 y.o. F who presents with weakess, frequent falls, and confusion on 09/15/20. CT head negative for acute abnormality. MRIMRI brain showed multifocal infarcts including cortical and subcortical areas of both cerebral and cerebellar hemispheres.Patient has a CT chest that showed new 1 cm lateral LUL nodule, ascending aortic aneurysm measuring 4.4 cm. MRI brain with contrast with punctate multifocal enhancements which could be related to infarct or metastasis.  LE [place] positive for left distal DVT. Significant PMH: HTN, osteoporosis, L1 compression fx s/p kyphoplasty.    PT Comments    Pt making good progress today.  Was able to ambulate 93' with RW and min A and less dizziness.  Limited due to chronic back pain.  Continue to progress as able.    Follow Up Recommendations  SNF;Supervision/Assistance - 24 hour     Equipment Recommendations  3in1 (PT) (further assessment next venue)    Recommendations for Other Services       Precautions / Restrictions Precautions Precautions: Fall Restrictions Weight Bearing Restrictions: No Other Position/Activity Restrictions: chronic back pain    Mobility  Bed Mobility Overal bed mobility: Needs Assistance Bed Mobility: Sit to Supine       Sit to supine: Mod assist   General bed mobility comments: Mod A - difficulty coordinating movement; was OOB with OT at arrival    Transfers Overall transfer level: Needs assistance Equipment used: Rolling walker (2 wheeled) Transfers: Sit to/from Stand Sit to Stand: Min assist         General transfer comment: Min A to rise; performed x 2  Ambulation/Gait Ambulation/Gait assistance: Min assist Gait Distance (Feet): 40 Feet Assistive device: Rolling walker (2 wheeled) Gait Pattern/deviations: Step-to pattern;Trunk flexed Gait velocity:  decreased   General Gait Details: Very flexed, kyphotic trunk - verbal and tactile cue for posture as able.  Min A to steady and for [employer]   Stairs             Wheelchair Mobility    Modified Rankin (Stroke Patients Only) Modified Rankin (Stroke Patients Only) Pre-Morbid Rankin Score: Moderately severe disability Modified Rankin: Moderately severe disability     Balance Overall balance assessment: Needs assistance Sitting-balance support: Feet supported;No upper extremity supported Sitting balance-Leahy Scale: Fair     Standing balance support: Bilateral upper extremity supported Standing balance-Leahy Scale: Poor Standing balance comment: requiring RW and min guard                            Cognition Arousal/Alertness: Awake/alert Behavior During Therapy: WFL for tasks assessed/performed Overall Cognitive Status: No family/caregiver present to determine baseline cognitive functioning                   Orientation Level: Disoriented to;Time;Situation   Memory: Decreased short-term memory Following Commands: Follows one step commands inconsistently Safety/Judgement: Decreased awareness of safety;Decreased awareness of deficits Awareness: Intellectual Problem Solving: Requires verbal cues        Exercises      General Comments General comments (skin integrity, edema, etc.): Pt reports some dizziness but improved from yesteraday.  Did note pt having difficulty with EOEM and tracking.  Initially not looking L past midline but later was able to, some difficulty with command.      Pertinent Vitals/Pain Pain Assessment: Faces Faces Pain Scale: Hurts even  more Pain Location: back - chronic Pain Descriptors / Indicators: Aching;Grimacing;Guarding;Radiating Pain Intervention(s): Limited activity within patient's tolerance;Monitored during session;Repositioned    Home Living                      Prior Function             PT Goals (current goals can now be found in the care plan section) Acute Rehab PT Goals Patient Stated Goal: hoping to return home PT Goal Formulation: With patient Time For Goal Achievement: 09/29/20 Potential to Achieve Goals: Fair Progress towards PT goals: Progressing toward goals    Frequency    Min 3X/week      PT Plan Current plan remains appropriate    Co-evaluation              AM-PAC PT "6 Clicks" Mobility   Outcome Measure  Help needed turning from your back to your side while in a flat bed without using bedrails?: A Little Help needed moving from lying on your back to sitting on the side of a flat bed without using bedrails?: A Lot Help needed moving to and from a bed to a chair (including a wheelchair)?: A Little Help needed standing up from a chair using your arms (e.g., wheelchair or bedside chair)?: A Little Help needed to walk in hospital room?: A Little Help needed climbing 3-5 steps with a railing? : A Lot 6 Click Score: 16    End of Session Equipment Utilized During Treatment: Gait belt Activity Tolerance: Patient limited by pain Patient left: with call bell/phone within reach;in bed;with bed alarm set Nurse Communication: Mobility status PT Visit Diagnosis: Other abnormalities of gait and mobility (R26.89);Muscle weakness (generalized) (M62.81)     Time: 1012-1027 PT Time Calculation (min) (ACUTE ONLY): 15 min  Charges:  $Gait Training: 8-22 mins                     Anise Salvo, PT Acute Rehab Services Pager [phone] [facility] [phone]     Rayetta Humphrey 09/23/2020, 12:25 PM

## 2020-09-24 DIAGNOSIS — Z515 Encounter for palliative care: Secondary | ICD-10-CM | POA: Diagnosis not present

## 2020-09-24 DIAGNOSIS — Z720 Tobacco use: Secondary | ICD-10-CM | POA: Diagnosis not present

## 2020-09-24 DIAGNOSIS — I10 Essential (primary) hypertension: Secondary | ICD-10-CM | POA: Diagnosis not present

## 2020-09-24 DIAGNOSIS — R627 Adult failure to thrive: Secondary | ICD-10-CM | POA: Diagnosis not present

## 2020-09-24 DIAGNOSIS — R918 Other nonspecific abnormal finding of lung field: Secondary | ICD-10-CM | POA: Diagnosis not present

## 2020-09-24 LAB — RENAL FUNCTION PANEL
Albumin: 2.9 g/dL — ABNORMAL LOW (ref 3.5–5.0)
Albumin: 2.9 g/dL — ABNORMAL LOW (ref 3.5–5.0)
Anion gap: 7 (ref 5–15)
Anion gap: 11 (ref 5–15)
BUN: 54 mg/dL — ABNORMAL HIGH (ref 8–23)
BUN: 52 mg/dL — ABNORMAL HIGH (ref 8–23)
CO2: 31 mmol/L (ref 22–32)
CO2: 29 mmol/L (ref 22–32)
Calcium: 9.7 mg/dL (ref 8.9–10.3)
Calcium: 9.5 mg/dL (ref 8.9–10.3)
Chloride: 110 mmol/L (ref 98–111)
Chloride: 104 mmol/L (ref 98–111)
Creatinine, Ser: 1.02 mg/dL — ABNORMAL HIGH (ref 0.44–1.00)
Creatinine, Ser: 1.19 mg/dL — ABNORMAL HIGH (ref 0.44–1.00)
GFR, Estimated: 55 mL/min — ABNORMAL LOW (ref >60)
GFR, Estimated: 46 mL/min — ABNORMAL LOW (ref >60)
Glucose, Bld: 118 mg/dL — ABNORMAL HIGH (ref 70–99)
Glucose, Bld: 120 mg/dL — ABNORMAL HIGH (ref 70–99)
Phosphorus: 5.2 mg/dL — ABNORMAL HIGH (ref 2.5–4.6)
Phosphorus: 5 mg/dL — ABNORMAL HIGH (ref 2.5–4.6)
Potassium: 4.6 mmol/L (ref 3.5–5.1)
Potassium: 3.8 mmol/L (ref 3.5–5.1)
Sodium: 148 mmol/L — ABNORMAL HIGH (ref 135–145)
Sodium: 144 mmol/L (ref 135–145)

## 2020-09-24 LAB — HEMOGLOBIN AND HEMATOCRIT, BLOOD
HCT: 29.9 % — ABNORMAL LOW (ref 36.0–46.0)
Hemoglobin: 9.1 g/dL — ABNORMAL LOW (ref 12.0–15.0)

## 2020-09-24 LAB — MAGNESIUM: Magnesium: 2.6 mg/dL — ABNORMAL HIGH (ref 1.7–2.4)

## 2020-09-24 MED ORDER — BUSPIRONE HCL 10 MG PO TABS
5.0000 mg | ORAL_TABLET | Freq: Two times a day (BID) | ORAL | Status: DC
  Administered 2020-09-24 – 2020-09-29 (×11): 5 mg via ORAL
  Filled 2020-09-24 (×11): qty 1

## 2020-09-24 MED ORDER — DEXTROSE 5 % IV SOLN: INTRAVENOUS | Status: DC

## 2020-09-24 NOTE — Progress Notes (Addendum)
   Palliative Medicine Inpatient Follow Up Note  Reason for consult:  Goals of Care  HPI:  Per intake H&P --> 80 year old F with PMH of PVD, HTN, HLDanddepression presenting with generalized weakness, intermittent nausea and vomiting, poor appetite and unintentional weight loss for about 2 weeks, andadmitted for FTT, depression with passivesuicidally andAKI.Identified by MRI to have multifocal bilateral cerebral and cerebellar stroke(s).  Palliative care has been asked to get involved in the setting of ongoing failure to thrive to further address and discuss goals of care.  Today's Discussion (09/24/2020):  *Please note that this is a verbal dictation therefore any spelling or grammatical errors are due to the "[place] [employer]" system interpretation.  Chart reviewed.  I met with Jennifer [name] at bedside this afternoon. We reviewed her present health state. She expresses the desire to improve her heath outcomes moving forward. We reviewed the need to continue with adequate nutrition and mobility. We discussed the plan for discharge to rehabilitation. I shared with Jennifer [name] that if she does not make great improvements there then hospice is a very reasonable next step given he nutritional and functional decline. She shares with me that she does not yet feel ready to die and would like to try rehabilitation at this point in time.   Questions and concerns addressed  ___________________________________________________ Addendum:  I met at bedside this later afternoon with patient sister and sister in law. We reviewed the plan at this point in time. They each heard the insights of Jennifer [name] herself who shares that she would like to continue with the idea of going to skilled nursing to further identify if improvements can be made in the way of her strength. We discussed that if they are not then we would pursue hospice care.   Everyone remains in agreement for placement at SNF.   Objective  Assessment: Vital Signs Vitals:   09/24/20 0727 09/24/20 1218  BP: (!) 170/74 (!) 152/88  Pulse: 80 77  Resp: 16 15  Temp: 97.7 F (36.5 C) 97.7 F (36.5 C)  SpO2: 100% (!) 87%    Intake/Output Summary (Last 24 hours) at 09/24/2020 1345 Last data filed at 09/24/2020 0645 Gross per 24 hour  Intake --  Output 2 ml  Net -2 ml   Last Weight  Most recent update: 09/24/2020  6:30 AM   Weight  38.3 kg (84 lb 7 oz)           Gen:  Elderly F in NAD HEENT: moist mucous membranes CV: Regular rate and rhythm  PULM: clear to auscultation bilaterally  ABD: soft/nontender  EXT: No edema  Neuro: Alert and oriented x2-3  SUMMARY OF RECOMMENDATIONS DNAR/DNI  MOST/DNR in Vynca  Plan for SNF placement to optimize strength, if patient does poorly she and her family are in alignment to pursue hospice  Palliative care will continue to incrementally follow though goals at this time are clear  Time Spent: 25 Greater than 50% of the time was spent in counseling and coordination of care ______________________________________________________________________________________ [facility] Team Team Cell Phone: [phone] Please utilize secure chat with additional questions, if there is no response within 30 minutes please call the above phone number  Palliative Medicine Team providers are available by phone from 7am to 7pm daily and can be reached through the team cell phone.  Should this patient require assistance outside of these hours, please call the patient's attending physician.

## 2020-09-24 NOTE — Plan of Care (Signed)
  Problem: Education: Goal: Knowledge of General Education information will improve Description: Including pain rating scale, medication(s)/side effects and non-pharmacologic comfort measures Outcome: Progressing   Problem: Health Behavior/Discharge Planning: Goal: Ability to manage health-related needs will improve Outcome: Progressing   Problem: Clinical Measurements: Goal: Ability to maintain clinical measurements within normal limits will improve Outcome: Progressing Goal: Will remain free from infection Outcome: Progressing Goal: Diagnostic test results will improve Outcome: Progressing Goal: Respiratory complications will improve Outcome: Progressing Goal: Cardiovascular complication will be avoided Outcome: Progressing   Problem: Activity: Goal: Risk for activity intolerance will decrease Outcome: Progressing   Problem: Nutrition: Goal: Adequate nutrition will be maintained Outcome: Progressing   Problem: Coping: Goal: Level of anxiety will decrease Outcome: Progressing   Problem: Elimination: Goal: Will not experience complications related to bowel motility Outcome: Progressing Goal: Will not experience complications related to urinary retention Outcome: Progressing   Problem: Pain Managment: Goal: General experience of comfort will improve Outcome: Progressing   Problem: Safety: Goal: Ability to remain free from injury will improve Outcome: Progressing   Problem: Skin Integrity: Goal: Risk for impaired skin integrity will decrease Outcome: Progressing   Problem: Education: Goal: Knowledge of disease or condition will improve Outcome: Progressing Goal: Knowledge of secondary prevention will improve Outcome: Progressing Goal: Knowledge of patient specific risk factors addressed and post discharge goals established will improve Outcome: Progressing Goal: Individualized Educational Video(s) Outcome: Progressing   Problem: Coping: Goal: Will verbalize  positive feelings about self Outcome: Progressing Goal: Will identify appropriate support needs Outcome: Progressing   Problem: Health Behavior/Discharge Planning: Goal: Ability to manage health-related needs will improve Outcome: Progressing   Problem: Ischemic Stroke/TIA Tissue Perfusion: Goal: Complications of ischemic stroke/TIA will be minimized Outcome: Progressing   

## 2020-09-24 NOTE — Progress Notes (Signed)
PROGRESS NOTE  Jennifer [name] OQH:[id number] DOB: [birth year]   PCP: Pincus Sanes, MD  Patient is from: Home.  Lives alone.  DOA: 09/15/2020 LOS: 8  Chief complaints: Generalized weakness  Brief Narrative / Interim history: 80 year old F with PMH of PVD, HTN, HLD, tobacco abuse and depression presenting with generalized weakness, intermittent nausea and vomiting, poor appetite and unintentional weight loss for about 2 weeks, and admitted for FTT, depression with passive suicidally and AKI.  Patient was recently started on Lexapro.  MRI brain showed multifocal infarcts including cortical and subcortical areas of both cerebral and cerebellar hemispheres.  Patient is already outside tPA window.  Neurology consulted the next morning. TTE without significant finding.  CTA with widespread atherosclerosis with 65% stenosis of proximal left ICA.  Carotid US with 40 to 59% left ICA stenosis.  LE [place] positive for distal DVT. Changed DAPT to Eliquis for anticoagulation.   Patient has a CT chest that showed new 1 cm lateral LUL nodule, ascending aortic aneurysm measuring 4.4 cm, partially visualized large AAA (chronic) and a large genital hernia. IR consulted for image guided biopsy of LUL nodule but recommended outpatient PET scan.  MRI brain with contrast with punctate multifocal enhancements which could be related to infarct or metastasis.  Follow-up MRI with contrast in approximately 4 weeks recommended.   Unfortunately, p.o. intake remain poor.  Patient and family not interested in tube feed.  She did not meet criteria for residential hospice.  Palliative medicine following intermittently.  Plan is discharge to SNF for trial of therapy when bed available.   Subjective: Seen and examined earlier this morning.  No major events overnight of this morning.  She states she feels great but appetite remains poor.  No complaints.  Objective: Vitals:   09/24/20 0314 09/24/20 0500 09/24/20 0727 09/24/20  1218  BP: (!) 157/65  (!) 170/74 (!) 152/88  Pulse: 81  80 77  Resp: 15  16 15   Temp: 98.1 F (36.7 C)  97.7 F (36.5 C) 97.7 F (36.5 C)  TempSrc: Oral  Oral Oral  SpO2: 97%  100% (!) 87%  Weight:  38.3 kg      Intake/Output Summary (Last 24 hours) at 09/24/2020 1414 Last data filed at 09/24/2020 0645 Gross per 24 hour  Intake --  Output 2 ml  Net -2 ml   Filed Weights   09/19/20 0700 09/23/20 0900 09/24/20 0500  Weight: 40 kg 37.7 kg 38.3 kg    Examination:  GENERAL: Frail looking elderly female.  No apparent distress. HEENT: MMM.  Vision and hearing grossly intact.  NECK: Supple.  No apparent JVD.  RESP: On RA.  No IWOB.  Fair aeration bilaterally. CVS:  RRR. Heart sounds normal.  ABD/GI/GU: BS+. Abd soft, NTND.  MSK/EXT:  Moves extremities. No apparent deformity. No edema.  SKIN: no apparent skin lesion or wound NEURO: Awake and alert. Oriented appropriately.  No apparent focal neuro deficit. PSYCH: Calm. Normal affect.   Procedures:  None  Microbiology summarized: COVID-19 and influenza PCR nonreactive.  Assessment & Plan: Failure to thrive in adult/history of anorexia nervosa- seems to be psychogenic but CVA, possible lung malignancy and cognitive decline could be contributing as well. TSH and CK within normal.  Oral intake remains poor. Patient and family not interested in tube feed.  Weight somewhat stable from admission. -Liberated diet-upright for meals given large hiatal hernia -Agree with Marinol for appetite -Changed Seroquel to olanzapine -RD and palliative medicine following  Major depression  with passive suicidal ideation: bright affect for the last 2 days -Appreciate input by psychiatry-stopped Lexapro out of concern for nausea and vomiting -Changed Seroquel to olanzapine given poor appetite -Psychiatry signed off.  Multifocal bilateral cerebral and cerebellar embolic stroke-noted on MRI brain. LE [place] positive for left distal DVT.  No focal  neurodeficit but generalized weakness.  She is already outside tPA window.  TTE without significant finding or PFO.  CTA with 65% left ICA stenosis but 40 to 59% on carotid US -On Eliquis 5 mg twice daily.  -Continue statin  LLE DVT-LE [place] with left peroneal and gastrocnemius DVT. -Eliquis as above  AKI/azotemia: Creatinine improved.  BUN up.  I suspect this is due to poor p.o. intake Recent Labs    09/07/20 1504 09/15/20 1109 09/16/20 0333 09/17/20 0355 09/18/20 0129 09/19/20 0247 09/20/20 0404 09/22/20 0319 09/23/20 0229 09/24/20 0113  BUN 13 33* 26* 15 19 18  24* 46* 45* 54*  CREATININE 1.01 1.47* 1.12* 0.99 1.17* 0.92 1.13* 1.16* 1.16* 1.02*  -Continue monitoring  Normocytic anemia: H&H relatively stable.  Anemia panel basically normal Recent Labs    09/15/20 1057 09/16/20 0333 09/17/20 0355 09/18/20 0129 09/19/20 0247 09/20/20 0404 09/21/20 0239 09/22/20 0319 09/23/20 0229 09/24/20 0113  HGB 11.9* 9.0* 9.0* 8.1* 8.4* 9.0* 8.8* 8.9* 8.9* 9.1*  -Monitor intermittently  Hypokalemia/hypomagnesemia/hypophosphatemia: Resolved  Essential hypertension: Normotensive for most part. -Continue holding home amlodipine  LUL nodule concerning for malignancy given patient's heavy smoking history-about 1 cm LUL nodule seen on CT chest. MRI brain with contrast with punctate multifocal enhancements which could be related to infarct or metastasis.   -Follow-up MRI with contrast in approximately 4 weeks recommended.  -IR consulted for image guided biopsy but felt to be high risk and recommended outpatient PT.  Ascending and abdominal aortic aneurysm/large AAA-not a surgical candidate. -Continue annual imaging  Emphysema/pulmonary scarring-likely from heavy smoking. -No respiratory symptoms  E. coli bacteriuria-patient denies UTI symptoms.  No leukocytosis but mild temp to 99.8 on 5/26.  -Completes antibiotic course with Keflex on 6/2.  HLD -Continue Lipitor  PVD -On  Eliquis.  High risk for bleeding to add antiplatelets  Tobacco use disorder -Encourage cessation -Nicotine patch  Thrombocytopenia: Resolved Recent Labs  Lab 09/18/20 0129 09/19/20 0247 09/20/20 0404 09/21/20 0239 09/23/20 0229  PLT 82* 89* 127* 145* 204    Goal of care-guarded prognosis especially with poor p.o. intake.  Patient and family not interested in tube feed.  Family expressed interest in [facility].  TOC placed referral.  -Palliative medicine following.  Severe malnutrition/FTT-decreased appetite with unintentional weight loss. Body mass index is 14.49 kg/m. Nutrition Problem: Severe Malnutrition Etiology: chronic illness (anorexia nervosa) Signs/Symptoms: energy intake < or equal to 75% for > or equal to 1 month,severe fat depletion,severe muscle depletion,percent weight loss Percent weight loss: 11 % (over 6 months) Interventions: Ensure Enlive (each supplement provides 350kcal and 20 grams of protein),Magic cup,Prostat,MVI,Calorie Count,Education   DVT prophylaxis:  On Eliquis for DVT  Code Status: DNR/DNI Family Communication: Updated patient's sister over the phone on 6/2  Level of care: Telemetry Medical  Status is: Inpatient  Remains inpatient appropriate because:Unsafe d/c plan   Dispo: The patient is from: Home              Anticipated d/c is to: SNF              Patient currently is medically stable to d/c.   Difficult to place patient No   Consultants:  Neurology-signed off Palliative medicine Psychiatry-signed off   Sch Meds:  Scheduled Meds: . (feeding supplement) PROSource Plus  30 mL Oral BID BM  . apixaban  5 mg Oral BID  . atorvastatin  40 mg Oral Daily  . busPIRone  5 mg Oral BID  . docusate sodium  100 mg Oral BID  . dronabinol  2.5 mg Oral BID AC  . feeding supplement  237 mL Oral TID BM  . multivitamin with minerals  1 tablet Oral Daily  . OLANZapine  2.5 mg Oral QHS  . pantoprazole  40 mg Oral Daily   Continuous  Infusions: . dextrose 60 mL/hr at 09/24/20 1022   PRN Meds:.acetaminophen **OR** acetaminophen, albuterol, bisacodyl, HYDROcodone-acetaminophen, morphine injection, nicotine, ondansetron **OR** ondansetron (ZOFRAN) IV, polyethylene glycol  Antimicrobials: Anti-infectives (From admission, onward)   Start     Dose/Rate Route Frequency Ordered Stop   09/20/20 0600  cephALEXin (KEFLEX) capsule 500 mg        500 mg Oral Every 8 hours 09/19/20 1316 09/23/20 2152   09/19/20 0900  cefTRIAXone (ROCEPHIN) 1 g in sodium chloride 0.9 % 100 mL IVPB  Status:  Discontinued        1 g 200 mL/hr over 30 Minutes Intravenous Every 24 hours 09/19/20 0811 09/19/20 1316       I have personally reviewed the following labs and images: CBC: Recent Labs  Lab 09/18/20 0129 09/19/20 0247 09/20/20 0404 09/21/20 0239 09/22/20 0319 09/23/20 0229 09/24/20 0113  WBC 8.2 7.2 9.7 8.7  --  9.5  --   HGB 8.1* 8.4* 9.0* 8.8* 8.9* 8.9* 9.1*  HCT 24.9* 25.8* 28.2* 27.5* 27.9* 28.4* 29.9*  MCV 79.6* 79.4* 81.3 81.1  --  81.8  --   PLT 82* 89* 127* 145*  --  204  --    BMP &GFR Recent Labs  Lab 09/19/20 0247 09/20/20 0404 09/21/20 0239 09/22/20 0319 09/23/20 0229 09/24/20 0113  NA 134* 139  --  143 144 148*  K 3.9 4.1  --  3.9 4.1 4.6  CL 100 106  --  104 104 110  CO2 24 25  --  GLUCOSE 98 102*  --  112* 114* 118*  BUN 18 24*  --  46* 45* 54*  CREATININE 0.92 1.13*  --  1.16* 1.16* 1.02*  CALCIUM 7.7* 8.7*  --  9.4 9.5 9.7  MG 1.8 1.9 2.0 2.2 2.5* 2.6*  PHOS 4.4 3.5  --  3.8 4.8* 5.2*   Estimated Creatinine Clearance: 25.7 mL/min (A) (by C-G formula based on SCr of 1.02 mg/dL (H)). Liver & Pancreas: Recent Labs  Lab 09/19/20 0247 09/20/20 0404 09/22/20 0319 09/23/20 0229 09/24/20 0113  ALBUMIN 2.5* 2.6* 2.7* 2.8* 2.9*   No results for input(s): LIPASE, AMYLASE in the last 168 hours. No results for input(s): AMMONIA in the last 168 hours. Diabetic: No results for input(s): HGBA1C  in the last 72 hours. No results for input(s): GLUCAP in the last 168 hours. Cardiac Enzymes: No results for input(s): CKTOTAL, CKMB, CKMBINDEX, TROPONINI in the last 168 hours. No results for input(s): PROBNP in the last 8760 hours. Coagulation Profile: No results for input(s): INR, PROTIME in the last 168 hours. Thyroid Function Tests: No results for input(s): TSH, T4TOTAL, FREET4, T3FREE, THYROIDAB in the last 72 hours. Lipid Profile: No results for input(s): CHOL, HDL, LDLCALC, TRIG, CHOLHDL, LDLDIRECT in the last 72 hours. Anemia Panel: Recent Labs    09/23/20 0229  [id number]  520  FOLATE 19.9  FERRITIN 102  TIBC 234*  IRON 32  RETICCTPCT 1.7   Urine analysis:    Component Value Date/Time   COLORURINE YELLOW 09/16/2020 1522   APPEARANCEUR HAZY (A) 09/16/2020 1522   LABSPEC 1.018 09/16/2020 1522   PHURINE 7.0 09/16/2020 1522   GLUCOSEU NEGATIVE 09/16/2020 1522   GLUCOSEU NEGATIVE 04/21/2015 1135   HGBUR LARGE (A) 09/16/2020 1522   BILIRUBINUR NEGATIVE 09/16/2020 1522   KETONESUR NEGATIVE 09/16/2020 1522   PROTEINUR NEGATIVE 09/16/2020 1522   UROBILINOGEN 0.2 04/21/2015 1135   NITRITE NEGATIVE 09/16/2020 1522   LEUKOCYTESUR TRACE (A) 09/16/2020 1522   Sepsis Labs: Invalid input(s): PROCALCITONIN, LACTICIDVEN  Microbiology: Recent Results (from the past 240 hour(s))  Resp Panel by RT-PCR (Flu A&B, Covid) Nasopharyngeal Swab     Status: None   Collection Time: 09/15/20  1:07 PM   Specimen: Nasopharyngeal Swab; Nasopharyngeal(NP) swabs in vial transport medium  Result Value Ref Range Status   SARS Coronavirus 2 by RT PCR NEGATIVE NEGATIVE Final    Comment: (NOTE) SARS-CoV-2 target nucleic acids are NOT DETECTED.  The SARS-CoV-2 RNA is generally detectable in upper respiratory specimens during the acute phase of infection. The lowest concentration of SARS-CoV-2 viral copies this assay can detect is 138 copies/mL. A negative result does not preclude  SARS-Cov-2 infection and should not be used as the sole basis for treatment or other patient management decisions. A negative result may occur with  improper specimen collection/handling, submission of specimen other than nasopharyngeal swab, presence of viral mutation(s) within the areas targeted by this assay, and inadequate number of viral copies(<138 copies/mL). A negative result must be combined with clinical observations, patient history, and epidemiological information. The expected result is Negative.  Fact Sheet for Patients:  BloggerCourse.com  Fact Sheet for Healthcare Providers:  SeriousBroker.it  This test is no t yet approved or cleared by the [place] FDA and  has been authorized for detection and/or diagnosis of SARS-CoV-2 by FDA under an Emergency Use Authorization (EUA). This EUA will remain  in effect (meaning this test can be used) for the duration of the COVID-19 declaration under Section 564(b)(1) of the Act, 21 U.S.C.section 360bbb-3(b)(1), unless the authorization is terminated  or revoked sooner.       Influenza A by PCR NEGATIVE NEGATIVE Final   Influenza B by PCR NEGATIVE NEGATIVE Final    Comment: (NOTE) The Xpert Xpress SARS-CoV-2/FLU/RSV plus assay is intended as an aid in the diagnosis of influenza from Nasopharyngeal swab specimens and should not be used as a sole basis for treatment. Nasal washings and aspirates are unacceptable for Xpert Xpress SARS-CoV-2/FLU/RSV testing.  Fact Sheet for Patients: BloggerCourse.com  Fact Sheet for Healthcare Providers: SeriousBroker.it  This test is not yet approved or cleared by the [place] FDA and has been authorized for detection and/or diagnosis of SARS-CoV-2 by FDA under an Emergency Use Authorization (EUA). This EUA will remain in effect (meaning this test can be used) for the duration of  the COVID-19 declaration under Section 564(b)(1) of the Act, 21 U.S.C. section 360bbb-3(b)(1), unless the authorization is terminated or revoked.  Performed at [facility], [address], [place], [place] [address]   Culture, Urine     Status: Abnormal   Collection Time: 09/16/20  3:22 PM   Specimen: Urine, Random  Result Value Ref Range Status   Specimen Description URINE, RANDOM  Final   Special Requests   Final    NONE Performed at  [facility], [address] [place], [place] [address]    Culture >=100,000 COLONIES/mL ESCHERICHIA COLI (A)  Final   Report Status 09/19/2020 FINAL  Final   Organism ID, Bacteria ESCHERICHIA COLI (A)  Final      Susceptibility   Escherichia coli - MIC*    AMPICILLIN <=2 SENSITIVE Sensitive     CEFAZOLIN <=4 SENSITIVE Sensitive     CEFEPIME <=0.12 SENSITIVE Sensitive     CEFTRIAXONE <=0.25 SENSITIVE Sensitive     CIPROFLOXACIN <=0.25 SENSITIVE Sensitive     GENTAMICIN <=1 SENSITIVE Sensitive     IMIPENEM <=0.25 SENSITIVE Sensitive     NITROFURANTOIN <=16 SENSITIVE Sensitive     TRIMETH/SULFA <=20 SENSITIVE Sensitive     AMPICILLIN/SULBACTAM <=2 SENSITIVE Sensitive     PIP/TAZO <=4 SENSITIVE Sensitive     * >=100,000 COLONIES/mL ESCHERICHIA COLI    Radiology Studies: No results found.    Rainer Mounce T. Solei Wubben [facility]  If 7PM-7AM, please contact night-coverage [email or web address] 09/24/2020, 2:14 PM

## 2020-09-24 NOTE — Progress Notes (Signed)
Physical Therapy Treatment Patient Details Name: Jennifer [name] MRN: [id number] DOB: 26-Nov-1940 Today's Date: 09/24/2020    History of Present Illness Pt is a 80 y.o. F who presents with weakess, frequent falls, and confusion on 09/15/20. CT head negative for acute abnormality. MRIMRI brain showed multifocal infarcts including cortical and subcortical areas of both cerebral and cerebellar hemispheres.Patient has a CT chest that showed new 1 cm lateral LUL nodule, ascending aortic aneurysm measuring 4.4 cm. MRI brain with contrast with punctate multifocal enhancements which could be related to infarct or metastasis.  LLE [place] positive for left distal DVT. Significant PMH: HTN, osteoporosis, L1 compression fx s/p kyphoplasty.    PT Comments    Pt progressing towards goals. Continues to be limited secondary to increased dizziness. Poor sequencing and requiring multimodal cues. Required mod A for bed mobility and min A to stand and take a few steps. Discussed fixating on object to help with dizziness. Current recommendations for SNF appropriate. Will continue to follow acutely.     Follow Up Recommendations  SNF;Supervision/Assistance - 24 hour     Equipment Recommendations  3in1 (PT)    Recommendations for Other Services       Precautions / Restrictions Precautions Precautions: Fall Restrictions Weight Bearing Restrictions: No    Mobility  Bed Mobility Overal bed mobility: Needs Assistance Bed Mobility: Supine to Sit;Sit to Supine     Supine to sit: Mod assist Sit to supine: Mod assist   General bed mobility comments: Mod A for trunk and LE assist. Pt would bring legs back onto bed once sitting and required max multimodal cues to keep in sitting posture.    Transfers Overall transfer level: Needs assistance Equipment used: Rolling walker (2 wheeled) Transfers: Sit to/from Stand Sit to Stand: Min assist         General transfer comment: Min A for lift assist and steadying.  Performed X 2 this session.  Ambulation/Gait Ambulation/Gait assistance: Min assist Gait Distance (Feet): 2 Feet Assistive device: Rolling walker (2 wheeled) Gait Pattern/deviations: Step-to pattern;Trunk flexed Gait velocity: decreased   General Gait Details: Pt reporting increased dizziness with mobility and had pt try to fixate on stationary object; would immediately look down. Was able to take a few steps forward and back, but pt with increased dizziness and unable to tolerate further mobility. Very flexed posture.   Stairs             Wheelchair Mobility    Modified Rankin (Stroke Patients Only) Modified Rankin (Stroke Patients Only) Pre-Morbid Rankin Score: Moderately severe disability Modified Rankin: Moderately severe disability     Balance Overall balance assessment: Needs assistance Sitting-balance support: No upper extremity supported;Feet supported Sitting balance-Leahy Scale: Fair     Standing balance support: Bilateral upper extremity supported;During functional activity Standing balance-Leahy Scale: Poor Standing balance comment: Reliant on UE and external support                            Cognition Arousal/Alertness: Awake/alert Behavior During Therapy: WFL for tasks assessed/performed Overall Cognitive Status: No family/caregiver present to determine baseline cognitive functioning Area of Impairment: Following commands;Safety/judgement;Problem solving;Awareness;Memory                     Memory: Decreased short-term memory Following Commands: Follows one step commands inconsistently Safety/Judgement: Decreased awareness of safety;Decreased awareness of deficits Awareness: Intellectual Problem Solving: Requires verbal cues General Comments: Pt would sit up and then immediately  try to lie back down. Required repetitive cues to attend to task at hand.      Exercises      General Comments        Pertinent Vitals/Pain Pain  Assessment: No/denies pain    Home Living                      Prior Function            PT Goals (current goals can now be found in the care plan section) Acute Rehab PT Goals Patient Stated Goal: hoping to try rehab to get better PT Goal Formulation: With patient Time For Goal Achievement: 09/29/20 Potential to Achieve Goals: Fair Progress towards PT goals: Progressing toward goals    Frequency    Min 3X/week      PT Plan Current plan remains appropriate    Co-evaluation              AM-PAC PT "6 Clicks" Mobility   Outcome Measure  Help needed turning from your back to your side while in a flat bed without using bedrails?: A Little Help needed moving from lying on your back to sitting on the side of a flat bed without using bedrails?: A Lot Help needed moving to and from a bed to a chair (including a wheelchair)?: A Little Help needed standing up from a chair using your arms (e.g., wheelchair or bedside chair)?: A Little Help needed to walk in hospital room?: A Lot Help needed climbing 3-5 steps with a railing? : A Lot 6 Click Score: 15    End of Session Equipment Utilized During Treatment: Gait belt Activity Tolerance: Treatment limited secondary to medical complications (Comment) (dizziness) Patient left: in bed;with call bell/phone within reach;with bed alarm set;with family/visitor present Nurse Communication: Mobility status PT Visit Diagnosis: Other abnormalities of gait and mobility (R26.89);Muscle weakness (generalized) (M62.81)     Time: 4010-2725 PT Time Calculation (min) (ACUTE ONLY): 24 min  Charges:  $Therapeutic Activity: 23-37 mins                     Jennifer [name], DPT  Acute Rehabilitation Services  Pager: [phone] Office: [phone]    Jennifer [name] 09/24/2020, 5:02 PM

## 2020-09-25 DIAGNOSIS — I10 Essential (primary) hypertension: Secondary | ICD-10-CM | POA: Diagnosis not present

## 2020-09-25 DIAGNOSIS — R918 Other nonspecific abnormal finding of lung field: Secondary | ICD-10-CM | POA: Diagnosis not present

## 2020-09-25 DIAGNOSIS — R410 Disorientation, unspecified: Secondary | ICD-10-CM

## 2020-09-25 DIAGNOSIS — R627 Adult failure to thrive: Secondary | ICD-10-CM | POA: Diagnosis not present

## 2020-09-25 DIAGNOSIS — Z720 Tobacco use: Secondary | ICD-10-CM | POA: Diagnosis not present

## 2020-09-25 LAB — RENAL FUNCTION PANEL
Albumin: 2.7 g/dL — ABNORMAL LOW (ref 3.5–5.0)
Anion gap: 10 (ref 5–15)
BUN: 51 mg/dL — ABNORMAL HIGH (ref 8–23)
CO2: 27 mmol/L (ref 22–32)
Calcium: 9 mg/dL (ref 8.9–10.3)
Chloride: 102 mmol/L (ref 98–111)
Creatinine, Ser: 1.18 mg/dL — ABNORMAL HIGH (ref 0.44–1.00)
GFR, Estimated: 46 mL/min — ABNORMAL LOW (ref >60)
Glucose, Bld: 114 mg/dL — ABNORMAL HIGH (ref 70–99)
Phosphorus: 4.4 mg/dL (ref 2.5–4.6)
Potassium: 3.9 mmol/L (ref 3.5–5.1)
Sodium: 139 mmol/L (ref 135–145)

## 2020-09-25 LAB — RESP PANEL BY RT-PCR (FLU A&B, COVID) ARPGX2
Influenza A by PCR: NEGATIVE
Influenza B by PCR: NEGATIVE
SARS Coronavirus 2 by RT PCR: NEGATIVE

## 2020-09-25 LAB — MAGNESIUM: Magnesium: 2.3 mg/dL (ref 1.7–2.4)

## 2020-09-25 NOTE — Plan of Care (Signed)
  Problem: Education: Goal: Knowledge of General Education information will improve Description: Including pain rating scale, medication(s)/side effects and non-pharmacologic comfort measures Outcome: Progressing   Problem: Health Behavior/Discharge Planning: Goal: Ability to manage health-related needs will improve Outcome: Progressing   Problem: Clinical Measurements: Goal: Ability to maintain clinical measurements within normal limits will improve Outcome: Progressing Goal: Will remain free from infection Outcome: Progressing Goal: Diagnostic test results will improve Outcome: Progressing Goal: Respiratory complications will improve Outcome: Progressing Goal: Cardiovascular complication will be avoided Outcome: Progressing   Problem: Activity: Goal: Risk for activity intolerance will decrease Outcome: Progressing   Problem: Nutrition: Goal: Adequate nutrition will be maintained Outcome: Progressing   Problem: Coping: Goal: Level of anxiety will decrease Outcome: Progressing   Problem: Elimination: Goal: Will not experience complications related to bowel motility Outcome: Progressing Goal: Will not experience complications related to urinary retention Outcome: Progressing   Problem: Pain Managment: Goal: General experience of comfort will improve Outcome: Progressing   Problem: Safety: Goal: Ability to remain free from injury will improve Outcome: Progressing   Problem: Skin Integrity: Goal: Risk for impaired skin integrity will decrease Outcome: Progressing   Problem: Education: Goal: Knowledge of disease or condition will improve Outcome: Progressing Goal: Knowledge of secondary prevention will improve Outcome: Progressing Goal: Knowledge of patient specific risk factors addressed and post discharge goals established will improve Outcome: Progressing Goal: Individualized Educational Video(s) Outcome: Progressing   Problem: Coping: Goal: Will verbalize  positive feelings about self Outcome: Progressing Goal: Will identify appropriate support needs Outcome: Progressing   Problem: Health Behavior/Discharge Planning: Goal: Ability to manage health-related needs will improve Outcome: Progressing   Problem: Ischemic Stroke/TIA Tissue Perfusion: Goal: Complications of ischemic stroke/TIA will be minimized Outcome: Progressing   

## 2020-09-25 NOTE — TOC Progression Note (Signed)
Transition of Care [facility]) - Progression Note    Patient Details  Name: Jennifer [name] MRN: [id number] Date of Birth: 09-23-1940  Transition of Care [facility]) CM/SW Contact  Carley Hammed, [place] Phone Number: 09/25/2020, 4:01 PM  Clinical Narrative:    CSW received insurance authorization for SNF at [facility]: Approved from 6/3- 6/7 Talbot Grumbling ID [id number] Plan auth ID [id number] CM Arist Dagout  CSW spoke with facility who noted they would need to follow up tomorrow if she can go, if not it will be Monday. CSW will request covid to be prepared for DC. SW will continue to follow for DC.   Expected Discharge Plan: Hospice Medical Facility Barriers to Discharge: Continued Medical Work up,Insurance Authorization  Expected Discharge Plan and Services Expected Discharge Plan: Hospice Medical Facility In-house Referral: Clinical Social Work Discharge Planning Services: CM Consult   Living arrangements for the past 2 months: Single Family Home (town house)                                       Social Determinants of Health (SDOH) Interventions    Readmission Risk Interventions No flowsheet data found.

## 2020-09-25 NOTE — Progress Notes (Signed)
PROGRESS NOTE  Jennifer BackersSusan L [name] XBM:[id number] DOB: 1940-09-29   PCP: Pincus SanesBurns, Stacy J, MD  Patient is from: Home.  Lives alone.  DOA: 09/15/2020 LOS: 9  Chief complaints: Generalized weakness  Brief Narrative / Interim history: 80 year old F with PMH of PVD, HTN, HLD, tobacco abuse and depression presenting with generalized weakness, intermittent nausea and vomiting, poor appetite and unintentional weight loss for about 2 weeks, and admitted for FTT, depression with passive suicidally and AKI.  Patient was recently started on Lexapro.  MRI brain showed multifocal infarcts including cortical and subcortical areas of both cerebral and cerebellar hemispheres.  Patient is already outside tPA window. TTE without significant finding.  CTA with widespread atherosclerosis with 65% stenosis of proximal left ICA.  Carotid US with 40 to 59% left ICA stenosis.  LE US positive for distal DVT.  Patient was started on Eliquis for anticoagulation.  Patient has a CT chest that showed new 1 cm lateral LUL nodule, ascending aortic aneurysm measuring 4.4 cm, partially visualized large AAA (chronic) and a large hiatal hernia. IR consulted for image guided biopsy of LUL nodule but recommended outpatient PET scan.  MRI brain with contrast with punctate multifocal enhancements which could be related to infarct or metastasis.  Follow-up MRI with contrast in approximately 4 weeks recommended.   Unfortunately, p.o. intake remained poor.  Patient and family not interested in tube feed.  She did not meet criteria for residential hospice.  Palliative medicine following intermittently.  Plan is discharge to SNF for trial of therapy when bed available.  She continues to have waxing and waning mental status.   Subjective: Seen and examined earlier this morning.  No major events overnight of this morning.  She is awake but only oriented to self.  She seems a little restless but not agitated.  Follows commands.  She responds no  to pain.  Objective: Vitals:   09/25/20 0314 09/25/20 0500 09/25/20 0741 09/25/20 1118  BP: 126/63  (!) 123/57 140/76  Pulse: 78  79 81  Resp:   18 18  Temp: (!) 97.5 F (36.4 C)  98 F (36.7 C) 97.8 F (36.6 C)  TempSrc: Oral     SpO2: 99%  94% 96%  Weight:  37 kg      Intake/Output Summary (Last 24 hours) at 09/25/2020 1257 Last data filed at 09/25/2020 [id number] Gross per 24 hour  Intake 1305.35 ml  Output --  Net 1305.35 ml   Filed Weights   09/23/20 0900 09/24/20 0500 09/25/20 0500  Weight: 37.7 kg 38.3 kg 37 kg    Examination:  GENERAL: Frail looking elderly female.   HEENT: MMM.  Vision and hearing grossly intact.  NECK: Supple.  No apparent JVD.  RESP: On RA.  No IWOB.  Fair aeration bilaterally. CVS:  RRR. Heart sounds normal.  ABD/GI/GU: BS+. Abd soft, NTND.  MSK/EXT:  Moves extremities.  Significant muscle mass and subcu fat loss. SKIN: no apparent skin lesion or wound NEURO: Awake and alert.  Oriented only to self.  Follows commands.  No apparent focal neuro deficit. PSYCH: Somewhat restless but no apparent distress  Procedures:  None  Microbiology summarized: COVID-19 and influenza PCR nonreactive.  Assessment & Plan: Failure to thrive in adult/history of anorexia nervosa- seems to be psychogenic but CVA, possible lung malignancy and cognitive decline could be contributing as well. TSH and CK within normal.  Oral intake remains poor. Patient and family not interested in tube feed.  Weight somewhat stable from admission. -  Liberated diet-upright for meals given large hiatal hernia -Continue Marinol for appetite -Changed Seroquel to olanzapine -RD and palliative medicine following  Major depression with passive suicidal ideation: bright affect for the last 2 days -Appreciate input by psychiatry-stopped Lexapro out of concern for nausea and vomiting -Changed Seroquel to olanzapine given poor appetite -Psychiatry signed off.  Multifocal bilateral cerebral  and cerebellar embolic stroke-noted on MRI brain. LE [place] positive for left distal DVT.  No focal neurodeficit but generalized weakness.  She is already outside tPA window.  TTE without significant finding or PFO.  CTA with 65% left ICA stenosis but 40 to 59% on carotid US -On Eliquis 5 mg twice daily.  -Continue statin  Delirium: Seems to have waxing or waning mental status. -Reorientation and delirium precautions  LLE DVT-LE [place] with left peroneal and gastrocnemius DVT. -Eliquis as above  AKI/azotemia: Likely due to poor p.o. intake. Recent Labs    09/16/20 0333 09/17/20 0355 09/18/20 0129 09/19/20 0247 09/20/20 0404 09/22/20 0319 09/23/20 0229 09/24/20 0113 09/24/20 1547 09/25/20 0816  BUN 26* 24* 46* 45* 54* 52* 51*  CREATININE 1.12* 0.99 1.17* 0.92 1.13* 1.16* 1.16* 1.02* 1.19* 1.18*  -Continue monitoring  Normocytic anemia: H&H relatively stable.  Anemia panel basically normal Recent Labs    09/15/20 1057 09/16/20 0333 09/17/20 0355 09/18/20 0129 09/19/20 0247 09/20/20 0404 09/21/20 0239 09/22/20 0319 09/23/20 0229 09/24/20 0113  HGB 11.9* 9.0* 9.0* 8.1* 8.4* 9.0* 8.8* 8.9* 8.9* 9.1*  -Monitor intermittently  Hypokalemia/hypomagnesemia/hypophosphatemia: Resolved  Essential hypertension: Normotensive for most part. -Continue holding home amlodipine  LUL nodule concerning for malignancy given patient's heavy smoking history-about 1 cm LUL nodule seen on CT chest. MRI brain with contrast with punctate multifocal enhancements which could be related to infarct or metastasis.   -Follow-up MRI with contrast in approximately 4 weeks recommended.  -IR consulted for image guided biopsy but felt to be high risk and recommended outpatient PT.  Ascending and abdominal aortic aneurysm/large AAA-not a surgical candidate. -Continue annual imaging  Emphysema/pulmonary scarring-likely from heavy smoking. -No respiratory symptoms  E. coli bacteriuria-patient denies  UTI symptoms.  No leukocytosis but mild temp to 99.8 on 5/26.  -Completed antibiotic course with Keflex on 6/2.  HLD -Continue Lipitor  PVD -On Eliquis.  High risk for bleeding to add antiplatelets  Tobacco use disorder -Encourage cessation -Nicotine patch  Thrombocytopenia: Resolved Recent Labs  Lab 09/19/20 0247 09/20/20 0404 09/21/20 0239 09/23/20 0229  PLT 89* 127* 145* 204    Goal of care-guarded prognosis especially with poor p.o. intake.  Patient and family not interested in tube feed.  Family expressed interest in [facility].  TOC placed referral.  -Palliative medicine following.  Severe malnutrition/FTT-decreased appetite with unintentional weight loss. Body mass index is 14 kg/m. Nutrition Problem: Severe Malnutrition Etiology: chronic illness (anorexia nervosa) Signs/Symptoms: energy intake < or equal to 75% for > or equal to 1 month,severe fat depletion,severe muscle depletion,percent weight loss Percent weight loss: 11 % (over 6 months) Interventions: Ensure Enlive (each supplement provides 350kcal and 20 grams of protein),Magic cup,Prostat,MVI,Calorie Count,Education   DVT prophylaxis:  On Eliquis for DVT  Code Status: DNR/DNI Family Communication: Updated patient's sister over the phone on 6/2   Level of care: Telemetry Medical  Status is: Inpatient  Remains inpatient appropriate because:Unsafe d/c plan   Dispo: The patient is from: Home              Anticipated d/c is to: SNF  Patient currently is medically stable to d/c.   Difficult to place patient No   Consultants:  Neurology-signed off Palliative medicine Psychiatry-signed off   Sch Meds:  Scheduled Meds: . (feeding supplement) PROSource Plus  30 mL Oral BID BM  . apixaban  5 mg Oral BID  . atorvastatin  40 mg Oral Daily  . busPIRone  5 mg Oral BID  . docusate sodium  100 mg Oral BID  . dronabinol  2.5 mg Oral BID AC  . feeding supplement  237 mL Oral TID BM  .  multivitamin with minerals  1 tablet Oral Daily  . OLANZapine  2.5 mg Oral QHS  . pantoprazole  40 mg Oral Daily   Continuous Infusions:  PRN Meds:.acetaminophen **OR** acetaminophen, albuterol, bisacodyl, HYDROcodone-acetaminophen, morphine injection, nicotine, ondansetron **OR** ondansetron (ZOFRAN) IV, polyethylene glycol  Antimicrobials: Anti-infectives (From admission, onward)   Start     Dose/Rate Route Frequency Ordered Stop   09/20/20 0600  cephALEXin (KEFLEX) capsule 500 mg        500 mg Oral Every 8 hours 09/19/20 1316 09/23/20 2152   09/19/20 0900  cefTRIAXone (ROCEPHIN) 1 g in sodium chloride 0.9 % 100 mL IVPB  Status:  Discontinued        1 g 200 mL/hr over 30 Minutes Intravenous Every 24 hours 09/19/20 0811 09/19/20 1316       I have personally reviewed the following labs and images: CBC: Recent Labs  Lab 09/19/20 0247 09/20/20 0404 09/21/20 0239 09/22/20 0319 09/23/20 0229 09/24/20 0113  WBC 7.2 9.7 8.7  --  9.5  --   HGB 8.4* 9.0* 8.8* 8.9* 8.9* 9.1*  HCT 25.8* 28.2* 27.5* 27.9* 28.4* 29.9*  MCV 79.4* 81.3 81.1  --  81.8  --   PLT 89* 127* 145*  --  204  --    BMP &GFR Recent Labs  Lab 09/21/20 0239 09/22/20 0319 09/23/20 0229 09/24/20 0113 09/24/20 1547 09/25/20 0816  NA  --  143 144 148* 144 139  K  --  3.9 4.1 4.6 3.8 3.9  CL  --  104 104 110 104 102  CO2  --  26 30 31 29 27   GLUCOSE  --  112* 114* 118* 120* 114*  BUN  --  46* 45* 54* 52* 51*  CREATININE  --  1.16* 1.16* 1.02* 1.19* 1.18*  CALCIUM  --  9.4 9.5 9.7 9.5 9.0  MG 2.0 2.2 2.5* 2.6*  --  2.3  PHOS  --  3.8 4.8* 5.2* 5.0* 4.4   Estimated Creatinine Clearance: 21.5 mL/min (A) (by C-G formula based on SCr of 1.18 mg/dL (H)). Liver & Pancreas: Recent Labs  Lab 09/22/20 0319 09/23/20 0229 09/24/20 0113 09/24/20 1547 09/25/20 0816  ALBUMIN 2.7* 2.8* 2.9* 2.9* 2.7*   No results for input(s): LIPASE, AMYLASE in the last 168 hours. No results for input(s): AMMONIA in the last  168 hours. Diabetic: No results for input(s): HGBA1C in the last 72 hours. No results for input(s): GLUCAP in the last 168 hours. Cardiac Enzymes: No results for input(s): CKTOTAL, CKMB, CKMBINDEX, TROPONINI in the last 168 hours. No results for input(s): PROBNP in the last 8760 hours. Coagulation Profile: No results for input(s): INR, PROTIME in the last 168 hours. Thyroid Function Tests: No results for input(s): TSH, T4TOTAL, FREET4, T3FREE, THYROIDAB in the last 72 hours. Lipid Profile: No results for input(s): CHOL, HDL, LDLCALC, TRIG, CHOLHDL, LDLDIRECT in the last 72 hours. Anemia Panel: Recent Labs  09/23/20 0229  VITAMINB12 520  FOLATE 19.9  FERRITIN 102  TIBC 234*  IRON 32  RETICCTPCT 1.7   Urine analysis:    Component Value Date/Time   COLORURINE YELLOW 09/16/2020 1522   APPEARANCEUR HAZY (A) 09/16/2020 1522   LABSPEC 1.018 09/16/2020 1522   PHURINE 7.0 09/16/2020 1522   GLUCOSEU NEGATIVE 09/16/2020 1522   GLUCOSEU NEGATIVE 04/21/2015 1135   HGBUR LARGE (A) 09/16/2020 1522   BILIRUBINUR NEGATIVE 09/16/2020 1522   KETONESUR NEGATIVE 09/16/2020 1522   PROTEINUR NEGATIVE 09/16/2020 1522   UROBILINOGEN 0.2 04/21/2015 1135   NITRITE NEGATIVE 09/16/2020 1522   LEUKOCYTESUR TRACE (A) 09/16/2020 1522   Sepsis Labs: Invalid input(s): PROCALCITONIN, LACTICIDVEN  Microbiology: Recent Results (from the past 240 hour(s))  Resp Panel by RT-PCR (Flu A&B, Covid) Nasopharyngeal Swab     Status: None   Collection Time: 09/15/20  1:07 PM   Specimen: Nasopharyngeal Swab; Nasopharyngeal(NP) swabs in vial transport medium  Result Value Ref Range Status   SARS Coronavirus 2 by RT PCR NEGATIVE NEGATIVE Final    Comment: (NOTE) SARS-CoV-2 target nucleic acids are NOT DETECTED.  The SARS-CoV-2 RNA is generally detectable in upper respiratory specimens during the acute phase of infection. The lowest concentration of SARS-CoV-2 viral copies this assay can detect is 138  copies/mL. A negative result does not preclude SARS-Cov-2 infection and should not be used as the sole basis for treatment or other patient management decisions. A negative result may occur with  improper specimen collection/handling, submission of specimen other than nasopharyngeal swab, presence of viral mutation(s) within the areas targeted by this assay, and inadequate number of viral copies(<138 copies/mL). A negative result must be combined with clinical observations, patient history, and epidemiological information. The expected result is Negative.  Fact Sheet for Patients:  BloggerCourse.com  Fact Sheet for Healthcare Providers:  SeriousBroker.it  This test is no t yet approved or cleared by the [place] FDA and  has been authorized for detection and/or diagnosis of SARS-CoV-2 by FDA under an Emergency Use Authorization (EUA). This EUA will remain  in effect (meaning this test can be used) for the duration of the COVID-19 declaration under Section 564(b)(1) of the Act, 21 U.S.C.section 360bbb-3(b)(1), unless the authorization is terminated  or revoked sooner.       Influenza A by PCR NEGATIVE NEGATIVE Final   Influenza B by PCR NEGATIVE NEGATIVE Final    Comment: (NOTE) The Xpert Xpress SARS-CoV-2/FLU/RSV plus assay is intended as an aid in the diagnosis of influenza from Nasopharyngeal swab specimens and should not be used as a sole basis for treatment. Nasal washings and aspirates are unacceptable for Xpert Xpress SARS-CoV-2/FLU/RSV testing.  Fact Sheet for Patients: BloggerCourse.com  Fact Sheet for Healthcare Providers: SeriousBroker.it  This test is not yet approved or cleared by the [place] FDA and has been authorized for detection and/or diagnosis of SARS-CoV-2 by FDA under an Emergency Use Authorization (EUA). This EUA will remain in effect (meaning  this test can be used) for the duration of the COVID-19 declaration under Section 564(b)(1) of the Act, 21 U.S.C. section 360bbb-3(b)(1), unless the authorization is terminated or revoked.  Performed at [facility], [address] [place], [place] [address]   Culture, Urine     Status: Abnormal   Collection Time: 09/16/20  3:22 PM   Specimen: Urine, Random  Result Value Ref Range Status   Specimen Description URINE, RANDOM  Final   Special Requests   Final  NONE Performed at [facility], [address] [place], [place] [address]    Culture >=100,000 COLONIES/mL ESCHERICHIA COLI (A)  Final   Report Status 09/19/2020 FINAL  Final   Organism ID, Bacteria ESCHERICHIA COLI (A)  Final      Susceptibility   Escherichia coli - MIC*    AMPICILLIN <=2 SENSITIVE Sensitive     CEFAZOLIN <=4 SENSITIVE Sensitive     CEFEPIME <=0.12 SENSITIVE Sensitive     CEFTRIAXONE <=0.25 SENSITIVE Sensitive     CIPROFLOXACIN <=0.25 SENSITIVE Sensitive     GENTAMICIN <=1 SENSITIVE Sensitive     IMIPENEM <=0.25 SENSITIVE Sensitive     NITROFURANTOIN <=16 SENSITIVE Sensitive     TRIMETH/SULFA <=20 SENSITIVE Sensitive     AMPICILLIN/SULBACTAM <=2 SENSITIVE Sensitive     PIP/TAZO <=4 SENSITIVE Sensitive     * >=100,000 COLONIES/mL ESCHERICHIA COLI    Radiology Studies: No results found.    Nikka Hakimian T. Malic Rosten [facility]  If 7PM-7AM, please contact night-coverage [email or web address] 09/25/2020, 12:57 PM

## 2020-09-26 DIAGNOSIS — R627 Adult failure to thrive: Secondary | ICD-10-CM | POA: Diagnosis not present

## 2020-09-26 LAB — RENAL FUNCTION PANEL
Albumin: 2.8 g/dL — ABNORMAL LOW (ref 3.5–5.0)
Anion gap: 10 (ref 5–15)
BUN: 47 mg/dL — ABNORMAL HIGH (ref 8–23)
CO2: 27 mmol/L (ref 22–32)
Calcium: 8.9 mg/dL (ref 8.9–10.3)
Chloride: 99 mmol/L (ref 98–111)
Creatinine, Ser: 1.08 mg/dL — ABNORMAL HIGH (ref 0.44–1.00)
GFR, Estimated: 51 mL/min — ABNORMAL LOW (ref >60)
Glucose, Bld: 102 mg/dL — ABNORMAL HIGH (ref 70–99)
Phosphorus: 4.4 mg/dL (ref 2.5–4.6)
Potassium: 4 mmol/L (ref 3.5–5.1)
Sodium: 136 mmol/L (ref 135–145)

## 2020-09-26 LAB — CBC
HCT: 29 % — ABNORMAL LOW (ref 36.0–46.0)
Hemoglobin: 9.1 g/dL — ABNORMAL LOW (ref 12.0–15.0)
MCH: 25.9 pg — ABNORMAL LOW (ref 26.0–34.0)
MCHC: 31.4 g/dL (ref 30.0–36.0)
MCV: 82.4 fL (ref 80.0–100.0)
Platelets: 220 10*3/uL (ref 150–400)
RBC: 3.52 MIL/uL — ABNORMAL LOW (ref 3.87–5.11)
RDW: 18.1 % — ABNORMAL HIGH (ref 11.5–15.5)
WBC: 14.3 10*3/uL — ABNORMAL HIGH (ref 4.0–10.5)
nRBC: 0 % (ref 0.0–0.2)

## 2020-09-26 LAB — MAGNESIUM: Magnesium: 2.4 mg/dL (ref 1.7–2.4)

## 2020-09-26 MED ORDER — ATORVASTATIN CALCIUM 10 MG PO TABS: 20.0000 mg | ORAL_TABLET | Freq: Every day | ORAL | Status: DC

## 2020-09-26 NOTE — TOC Progression Note (Signed)
Transition of Care [facility]) - Progression Note    Patient Details  Name: Jennifer [name] MRN: [id number] Date of Birth: 06-06-1940  Transition of Care [facility]) CM/SW Contact  Carley Hammed, [place] Phone Number: 09/26/2020, 11:12 AM  Clinical Narrative:    CSW followed up with SNF facility who noted they are not able to take pt today, they hope they will be able to tomorrow. CSW updated MD, nurse, and left a VM for pt's sister. SW will continue to follow.   Expected Discharge Plan: Hospice Medical Facility Barriers to Discharge: Continued Medical Work up,Insurance Authorization  Expected Discharge Plan and Services Expected Discharge Plan: Hospice Medical Facility In-house Referral: Clinical Social Work Discharge Planning Services: CM Consult   Living arrangements for the past 2 months: Single Family Home (town house)                                       Social Determinants of Health (SDOH) Interventions    Readmission Risk Interventions No flowsheet data found.

## 2020-09-26 NOTE — Progress Notes (Signed)
Civil engineer, contracting [facility])  Hospital Liaison: RN note         This patient has been referred to our palliative care services in the community.  [facility] will continue to follow for any discharge planning needs and to coordinate continuation of palliative care in the outpatient setting.  In her discharge papers, please place order for palliative to follow at the facility she discharges to.    If you have questions or need assistance, please call [phone] or contact the hospital Liaison listed on AMION.      Thank you for this referral.         Elsie Saas, RN, [facility]  [facility] Liaison   [phone]

## 2020-09-26 NOTE — Plan of Care (Signed)
  Problem: Education: Goal: Knowledge of General Education information will improve Description: Including pain rating scale, medication(s)/side effects and non-pharmacologic comfort measures Outcome: Progressing   Problem: Health Behavior/Discharge Planning: Goal: Ability to manage health-related needs will improve Outcome: Progressing   Problem: Clinical Measurements: Goal: Ability to maintain clinical measurements within normal limits will improve Outcome: Progressing Goal: Will remain free from infection Outcome: Progressing Goal: Diagnostic test results will improve Outcome: Progressing Goal: Respiratory complications will improve Outcome: Progressing Goal: Cardiovascular complication will be avoided Outcome: Progressing   Problem: Activity: Goal: Risk for activity intolerance will decrease Outcome: Progressing   Problem: Nutrition: Goal: Adequate nutrition will be maintained Outcome: Progressing   Problem: Coping: Goal: Level of anxiety will decrease Outcome: Progressing   Problem: Elimination: Goal: Will not experience complications related to bowel motility Outcome: Progressing Goal: Will not experience complications related to urinary retention Outcome: Progressing   Problem: Pain Managment: Goal: General experience of comfort will improve Outcome: Progressing   Problem: Safety: Goal: Ability to remain free from injury will improve Outcome: Progressing   Problem: Skin Integrity: Goal: Risk for impaired skin integrity will decrease Outcome: Progressing   Problem: Education: Goal: Knowledge of disease or condition will improve Outcome: Progressing Goal: Knowledge of secondary prevention will improve Outcome: Progressing Goal: Knowledge of patient specific risk factors addressed and post discharge goals established will improve Outcome: Progressing Goal: Individualized Educational Video(s) Outcome: Progressing   Problem: Coping: Goal: Will verbalize  positive feelings about self Outcome: Progressing Goal: Will identify appropriate support needs Outcome: Progressing   Problem: Health Behavior/Discharge Planning: Goal: Ability to manage health-related needs will improve Outcome: Progressing   Problem: Ischemic Stroke/TIA Tissue Perfusion: Goal: Complications of ischemic stroke/TIA will be minimized Outcome: Progressing   

## 2020-09-26 NOTE — Progress Notes (Addendum)
PROGRESS NOTE  Jennifer [name] OVF:[id number] DOB: August 19, 1940   PCP: Pincus Sanes, MD  Patient is from: Home.  Lives alone.  DOA: 09/15/2020 LOS: 10  Chief complaints: Generalized weakness  Brief Narrative / Interim history: 80 year old F with PMH of PVD, HTN, HLD, tobacco abuse and depression presenting with generalized weakness, intermittent nausea and vomiting, poor appetite and unintentional weight loss for about 2 weeks, and admitted for FTT, depression with passive suicidally and AKI.  Patient was recently started on Lexapro.  MRI brain showed multifocal infarcts including cortical and subcortical areas of both cerebral and cerebellar hemispheres.  Patient is already outside tPA window. TTE without significant finding.  CTA with widespread atherosclerosis with 65% stenosis of proximal left ICA.  Carotid US with 40 to 59% left ICA stenosis.  LE [place] positive for distal DVT.  Patient was started on Eliquis for anticoagulation.  Patient has a CT chest that showed new 1 cm lateral LUL nodule, ascending aortic aneurysm measuring 4.4 cm, partially visualized large AAA (chronic) and a large hiatal hernia. IR consulted for image guided biopsy of LUL nodule but recommended outpatient PET scan.  MRI brain with contrast with punctate multifocal enhancements which could be related to infarct or metastasis.  Follow-up MRI with contrast in approximately 4 weeks recommended.   Unfortunately, p.o. intake remained poor.  Patient and family not interested in tube feed.  She did not meet criteria for residential hospice.  Palliative medicine following intermittently.  Plan is discharge to SNF for trial of therapy when bed available.  She continues to have waxing and waning mental status.   Subjective: Patient seen and examined.  Sleepy but easily arousable.  Once awake, she was alert and oriented x2.  Denied any complaint.  Objective: Vitals:   09/26/20 0012 09/26/20 0402 09/26/20 0439 09/26/20 0748   BP: 130/62 108/71  (!) 144/61  Pulse: 82 88  77  Resp: 18 19  18   Temp: 98.3 F (36.8 C) 97.7 F (36.5 C)  98 F (36.7 C)  TempSrc: Oral   Oral  SpO2: 96% 97%  96%  Weight:   40.1 kg    No intake or output data in the 24 hours ending 09/26/20 1148 Filed Weights   09/24/20 0500 09/25/20 0500 09/26/20 0439  Weight: 38.3 kg 37 kg 40.1 kg    Examination:  General exam: Appears calm and comfortable  Respiratory system: Clear to auscultation. Respiratory effort normal. Cardiovascular system: S1 & S2 heard, RRR. No JVD, murmurs, rubs, gallops or clicks. No pedal edema. Gastrointestinal system: Abdomen is nondistended, soft and nontender. No organomegaly or masses felt. Normal bowel sounds heard. Central nervous system: Alert and oriented x2.  Left-sided neglect. Extremities: Symmetric 5 x 5 power. Skin: No rashes, lesions or ulcers.  Psychiatry: Judgement and insight appear poor  Procedures:  None  Microbiology summarized: COVID-19 and influenza PCR nonreactive.  Assessment & Plan: Failure to thrive in adult/history of anorexia nervosa- seems to be psychogenic but CVA, possible lung malignancy and cognitive decline could be contributing as well. TSH and CK within normal.  Oral intake remains poor. Patient and family not interested in tube feed.  Weight somewhat stable from admission. -Liberated diet-upright for meals given large hiatal hernia -Continue Marinol for appetite -Changed Seroquel to olanzapine -RD and palliative medicine following  Major depression with passive suicidal ideation:  -Appreciate input by psychiatry-stopped Lexapro out of concern for nausea and vomiting -Changed Seroquel to olanzapine given poor appetite -Psychiatry signed off.  Multifocal bilateral cerebral and cerebellar embolic stroke-noted on MRI brain. LE [place] positive for left distal DVT.  No focal neurodeficit but generalized weakness.  She was already outside tPA window.  TTE without significant  finding or PFO.  CTA with 65% left ICA stenosis but 40 to 59% on carotid US -On Eliquis 5 mg twice daily.  -Continue statin  Delirium: Currently fully alert and oriented. -Reorientation and delirium precautions  LLE DVT-LE [place] with left peroneal and gastrocnemius DVT. -Eliquis as above  AKI/azotemia: Likely due to poor p.o. intake. Recent Labs    09/17/20 0355 09/18/20 0129 09/19/20 0247 09/20/20 0404 09/22/20 0319 09/23/20 0229 09/24/20 0113 09/24/20 1547 09/25/20 0816 09/26/20 0146  BUN 15 19 18  24* 46* 45* 54* 52* 51* 47*  CREATININE 0.99 1.17* 0.92 1.13* 1.16* 1.16* 1.02* 1.19* 1.18* 1.08*  -Continue monitoring  Normocytic anemia: H&H relatively stable.  Anemia panel basically normal Recent Labs    09/16/20 0333 09/17/20 0355 09/18/20 0129 09/19/20 0247 09/20/20 0404 09/21/20 0239 09/22/20 0319 09/23/20 0229 09/24/20 0113 09/26/20 0146  HGB 9.0* 9.0* 8.1* 8.4* 9.0* 8.8* 8.9* 8.9* 9.1* 9.1*  -Monitor intermittently  Hypokalemia/hypomagnesemia/hypophosphatemia: Resolved  Essential hypertension: Normotensive for most part. -Continue holding home amlodipine  LUL nodule concerning for malignancy given patient's heavy smoking history-about 1 cm LUL nodule seen on CT chest. MRI brain with contrast with punctate multifocal enhancements which could be related to infarct or metastasis.   -Follow-up MRI with contrast in approximately 4 weeks recommended.  -IR consulted for image guided biopsy but felt to be high risk and recommended outpatient PT.  Ascending and abdominal aortic aneurysm/large AAA-not a surgical candidate. -Continue annual imaging  Emphysema/pulmonary scarring-likely from heavy smoking. -No respiratory symptoms  E. coli bacteriuria-patient denies UTI symptoms.  No leukocytosis but mild temp to 99.8 on 5/26.  -Completed antibiotic course with Keflex on 6/2.  HLD Resume Lipitor.  PVD -On Eliquis.  High risk for bleeding to add  antiplatelets  Tobacco use disorder -Encourage cessation -Nicotine patch  Thrombocytopenia: Resolved Recent Labs  Lab 09/20/20 0404 09/21/20 0239 09/23/20 0229 09/26/20 0146  PLT 127* 145* 204 220    Goal of care-guarded prognosis especially with poor p.o. intake.  Patient and family not interested in tube feed.  Family expressed interest in [facility].  TOC placed referral.  She did not qualify for hospice home.  Plan for discharge to SNF. -Palliative medicine following.  Severe malnutrition/FTT-decreased appetite with unintentional weight loss. Body mass index is 15.17 kg/m. Nutrition Problem: Severe Malnutrition Etiology: chronic illness (anorexia nervosa) Signs/Symptoms: energy intake < or equal to 75% for > or equal to 1 month,severe fat depletion,severe muscle depletion,percent weight loss Percent weight loss: 11 % (over 6 months) Interventions: Ensure Enlive (each supplement provides 350kcal and 20 grams of protein),Magic cup,Prostat,MVI,Calorie Count,Education   DVT prophylaxis:  On Eliquis for DVT  Code Status: DNR/DNI Family Communication: None present at bedside.  Level of care: Telemetry Medical  Status is: Inpatient  Remains inpatient appropriate because:Unsafe d/c plan   Dispo: The patient is from: Home              Anticipated d/c is to: SNF              Patient currently is medically stable to d/c.   Difficult to place patient No   Consultants:  Neurology-signed off Palliative medicine Psychiatry-signed off   Sch Meds:  Scheduled Meds: . (feeding supplement) PROSource Plus  30 mL Oral BID BM  .  apixaban  5 mg Oral BID  . atorvastatin  40 mg Oral Daily  . busPIRone  5 mg Oral BID  . docusate sodium  100 mg Oral BID  . dronabinol  2.5 mg Oral BID AC  . feeding supplement  237 mL Oral TID BM  . multivitamin with minerals  1 tablet Oral Daily  . OLANZapine  2.5 mg Oral QHS  . pantoprazole  40 mg Oral Daily   Continuous Infusions:  PRN  Meds:.acetaminophen **OR** acetaminophen, albuterol, bisacodyl, HYDROcodone-acetaminophen, morphine injection, nicotine, ondansetron **OR** ondansetron (ZOFRAN) IV, polyethylene glycol  Antimicrobials: Anti-infectives (From admission, onward)   Start     Dose/Rate Route Frequency Ordered Stop   09/20/20 0600  cephALEXin (KEFLEX) capsule 500 mg        500 mg Oral Every 8 hours 09/19/20 1316 09/23/20 2152   09/19/20 0900  cefTRIAXone (ROCEPHIN) 1 g in sodium chloride 0.9 % 100 mL IVPB  Status:  Discontinued        1 g 200 mL/hr over 30 Minutes Intravenous Every 24 hours 09/19/20 0811 09/19/20 1316       I have personally reviewed the following labs and images: CBC: Recent Labs  Lab 09/20/20 0404 09/21/20 0239 09/22/20 0319 09/23/20 0229 09/24/20 0113 09/26/20 0146  WBC 9.7 8.7  --  9.5  --  14.3*  HGB 9.0* 8.8* 8.9* 8.9* 9.1* 9.1*  HCT 28.2* 27.5* 27.9* 28.4* 29.9* 29.0*  MCV 81.3 81.1  --  81.8  --  82.4  PLT 127* 145*  --  204  --  220   BMP &GFR Recent Labs  Lab 09/22/20 0319 09/23/20 0229 09/24/20 0113 09/24/20 1547 09/25/20 0816 09/26/20 0146  NA 143 144 148* 144 139 136  K 3.9 4.1 4.6 3.8 3.9 4.0  CL 104 104 110 104 102 99  CO2 GLUCOSE 112* 114* 118* 120* 114* 102*  BUN 46* 45* 54* 52* 51* 47*  CREATININE 1.16* 1.16* 1.02* 1.19* 1.18* 1.08*  CALCIUM 9.4 9.5 9.7 9.5 9.0 8.9  MG 2.2 2.5* 2.6*  --  2.3 2.4  PHOS 3.8 4.8* 5.2* 5.0* 4.4 4.4   Estimated Creatinine Clearance: 25.4 mL/min (A) (by C-G formula based on SCr of 1.08 mg/dL (H)). Liver & Pancreas: Recent Labs  Lab 09/23/20 0229 09/24/20 0113 09/24/20 1547 09/25/20 0816 09/26/20 0146  ALBUMIN 2.8* 2.9* 2.9* 2.7* 2.8*   No results for input(s): LIPASE, AMYLASE in the last 168 hours. No results for input(s): AMMONIA in the last 168 hours. Diabetic: No results for input(s): HGBA1C in the last 72 hours. No results for input(s): GLUCAP in the last 168 hours. Cardiac Enzymes: No  results for input(s): CKTOTAL, CKMB, CKMBINDEX, TROPONINI in the last 168 hours. No results for input(s): PROBNP in the last 8760 hours. Coagulation Profile: No results for input(s): INR, PROTIME in the last 168 hours. Thyroid Function Tests: No results for input(s): TSH, T4TOTAL, FREET4, T3FREE, THYROIDAB in the last 72 hours. Lipid Profile: No results for input(s): CHOL, HDL, LDLCALC, TRIG, CHOLHDL, LDLDIRECT in the last 72 hours. Anemia Panel: No results for input(s): VITAMINB12, FOLATE, FERRITIN, TIBC, IRON, RETICCTPCT in the last 72 hours. Urine analysis:    Component Value Date/Time   COLORURINE YELLOW 09/16/2020 1522   APPEARANCEUR HAZY (A) 09/16/2020 1522   LABSPEC 1.018 09/16/2020 1522   PHURINE 7.0 09/16/2020 1522   GLUCOSEU NEGATIVE 09/16/2020 1522   GLUCOSEU NEGATIVE 04/21/2015 1135   HGBUR LARGE (A) 09/16/2020  1522   BILIRUBINUR NEGATIVE 09/16/2020 1522   KETONESUR NEGATIVE 09/16/2020 1522   PROTEINUR NEGATIVE 09/16/2020 1522   UROBILINOGEN 0.2 04/21/2015 1135   NITRITE NEGATIVE 09/16/2020 1522   LEUKOCYTESUR TRACE (A) 09/16/2020 1522   Sepsis Labs: Invalid input(s): PROCALCITONIN, LACTICIDVEN  Microbiology: Recent Results (from the past 240 hour(s))  Culture, Urine     Status: Abnormal   Collection Time: 09/16/20  3:22 PM   Specimen: Urine, Random  Result Value Ref Range Status   Specimen Description URINE, RANDOM  Final   Special Requests   Final    NONE Performed at [facility], [address] [place], [place] [address]    Culture >=100,000 COLONIES/mL ESCHERICHIA COLI (A)  Final   Report Status 09/19/2020 FINAL  Final   Organism ID, Bacteria ESCHERICHIA COLI (A)  Final      Susceptibility   Escherichia coli - MIC*    AMPICILLIN <=2 SENSITIVE Sensitive     CEFAZOLIN <=4 SENSITIVE Sensitive     CEFEPIME <=0.12 SENSITIVE Sensitive     CEFTRIAXONE <=0.25 SENSITIVE Sensitive     CIPROFLOXACIN <=0.25 SENSITIVE Sensitive     GENTAMICIN <=1  SENSITIVE Sensitive     IMIPENEM <=0.25 SENSITIVE Sensitive     NITROFURANTOIN <=16 SENSITIVE Sensitive     TRIMETH/SULFA <=20 SENSITIVE Sensitive     AMPICILLIN/SULBACTAM <=2 SENSITIVE Sensitive     PIP/TAZO <=4 SENSITIVE Sensitive     * >=100,000 COLONIES/mL ESCHERICHIA COLI  Resp Panel by RT-PCR (Flu A&B, Covid) Nasopharyngeal Swab     Status: None   Collection Time: 09/25/20 10:00 PM   Specimen: Nasopharyngeal Swab; Nasopharyngeal(NP) swabs in vial transport medium  Result Value Ref Range Status   SARS Coronavirus 2 by RT PCR NEGATIVE NEGATIVE Final    Comment: (NOTE) SARS-CoV-2 target nucleic acids are NOT DETECTED.  The SARS-CoV-2 RNA is generally detectable in upper respiratory specimens during the acute phase of infection. The lowest concentration of SARS-CoV-2 viral copies this assay can detect is 138 copies/mL. A negative result does not preclude SARS-Cov-2 infection and should not be used as the sole basis for treatment or other patient management decisions. A negative result may occur with  improper specimen collection/handling, submission of specimen other than nasopharyngeal swab, presence of viral mutation(s) within the areas targeted by this assay, and inadequate number of viral copies(<138 copies/mL). A negative result must be combined with clinical observations, patient history, and epidemiological information. The expected result is Negative.  Fact Sheet for Patients:  BloggerCourse.comhttps://www.fda.gov/media/152166/download  Fact Sheet for Healthcare Providers:  SeriousBroker.ithttps://www.fda.gov/media/152162/download  This test is no t yet approved or cleared by the [place] FDA and  has been authorized for detection and/or diagnosis of SARS-CoV-2 by FDA under an Emergency Use Authorization (EUA). This EUA will remain  in effect (meaning this test can be used) for the duration of the COVID-19 declaration under Section 564(b)(1) of the Act, 21 U.S.C.section 360bbb-3(b)(1), unless  the authorization is terminated  or revoked sooner.       Influenza A by PCR NEGATIVE NEGATIVE Final   Influenza B by PCR NEGATIVE NEGATIVE Final    Comment: (NOTE) The Xpert Xpress SARS-CoV-2/FLU/RSV plus assay is intended as an aid in the diagnosis of influenza from Nasopharyngeal swab specimens and should not be used as a sole basis for treatment. Nasal washings and aspirates are unacceptable for Xpert Xpress SARS-CoV-2/FLU/RSV testing.  Fact Sheet for Patients: BloggerCourse.comhttps://www.fda.gov/media/152166/download  Fact Sheet for Healthcare Providers: SeriousBroker.ithttps://www.fda.gov/media/152162/download  This test is not  yet approved or cleared by the [place] and has been authorized for detection and/or diagnosis of SARS-CoV-2 by FDA under an Emergency Use Authorization (EUA). This EUA will remain in effect (meaning this test can be used) for the duration of the COVID-19 declaration under Section 564(b)(1) of the Act, 21 U.S.C. section 360bbb-3(b)(1), unless the authorization is terminated or revoked.  Performed at [facility], [address] [place], [place] [address]     Radiology Studies: No results found.  Hughie Closs, MD [facility]  If 7PM-7AM, please contact night-coverage [email or web address] 09/26/2020, 11:48 AM

## 2020-09-26 NOTE — Plan of Care (Signed)
Problem: Education: Goal: Knowledge of General Education information will improve Description: Including pain rating scale, medication(s)/side effects and non-pharmacologic comfort measures 09/26/2020 0940 by Deliah Boston, RN Outcome: Progressing 09/26/2020 0903 by Deliah Boston, RN Outcome: Progressing   Problem: Health Behavior/Discharge Planning: Goal: Ability to manage health-related needs will improve 09/26/2020 0940 by Deliah Boston, RN Outcome: Progressing 09/26/2020 0903 by Deliah Boston, RN Outcome: Progressing   Problem: Clinical Measurements: Goal: Ability to maintain clinical measurements within normal limits will improve 09/26/2020 0940 by Deliah Boston, RN Outcome: Progressing 09/26/2020 0903 by Deliah Boston, RN Outcome: Progressing Goal: Will remain free from infection 09/26/2020 0940 by Deliah Boston, RN Outcome: Progressing 09/26/2020 0903 by Deliah Boston, RN Outcome: Progressing Goal: Diagnostic test results will improve 09/26/2020 0940 by Deliah Boston, RN Outcome: Progressing 09/26/2020 0903 by Deliah Boston, RN Outcome: Progressing Goal: Respiratory complications will improve 09/26/2020 0940 by Deliah Boston, RN Outcome: Progressing 09/26/2020 0903 by Deliah Boston, RN Outcome: Progressing Goal: Cardiovascular complication will be avoided 09/26/2020 0940 by Deliah Boston, RN Outcome: Progressing 09/26/2020 0903 by Deliah Boston, RN Outcome: Progressing   Problem: Activity: Goal: Risk for activity intolerance will decrease 09/26/2020 0940 by Deliah Boston, RN Outcome: Progressing 09/26/2020 0903 by Deliah Boston, RN Outcome: Progressing   Problem: Nutrition: Goal: Adequate nutrition will be maintained 09/26/2020 0940 by Deliah Boston, RN Outcome: Progressing 09/26/2020 0903 by Deliah Boston, RN Outcome: Progressing   Problem: Coping: Goal: Level of anxiety will decrease 09/26/2020 0940 by Deliah Boston, RN Outcome:  Progressing 09/26/2020 0903 by Deliah Boston, RN Outcome: Progressing   Problem: Elimination: Goal: Will not experience complications related to bowel motility 09/26/2020 0940 by Deliah Boston, RN Outcome: Progressing 09/26/2020 0903 by Deliah Boston, RN Outcome: Progressing Goal: Will not experience complications related to urinary retention 09/26/2020 0940 by Deliah Boston, RN Outcome: Progressing 09/26/2020 0903 by Deliah Boston, RN Outcome: Progressing   Problem: Pain Managment: Goal: General experience of comfort will improve 09/26/2020 0940 by Deliah Boston, RN Outcome: Progressing 09/26/2020 0903 by Deliah Boston, RN Outcome: Progressing   Problem: Safety: Goal: Ability to remain free from injury will improve 09/26/2020 0940 by Deliah Boston, RN Outcome: Progressing 09/26/2020 0903 by Deliah Boston, RN Outcome: Progressing   Problem: Skin Integrity: Goal: Risk for impaired skin integrity will decrease 09/26/2020 0940 by Deliah Boston, RN Outcome: Progressing 09/26/2020 0903 by Deliah Boston, RN Outcome: Progressing   Problem: Education: Goal: Knowledge of disease or condition will improve 09/26/2020 0940 by Deliah Boston, RN Outcome: Progressing 09/26/2020 0903 by Deliah Boston, RN Outcome: Progressing Goal: Knowledge of secondary prevention will improve 09/26/2020 0940 by Deliah Boston, RN Outcome: Progressing 09/26/2020 0903 by Deliah Boston, RN Outcome: Progressing Goal: Knowledge of patient specific risk factors addressed and post discharge goals established will improve 09/26/2020 0940 by Deliah Boston, RN Outcome: Progressing 09/26/2020 0903 by Deliah Boston, RN Outcome: Progressing Goal: Individualized Educational Video(s) 09/26/2020 0940 by Deliah Boston, RN Outcome: Progressing 09/26/2020 0903 by Deliah Boston, RN Outcome: Progressing   Problem: Coping: Goal: Will verbalize positive feelings about self 09/26/2020 0940 by  Deliah Boston, RN Outcome: Progressing 09/26/2020 0903 by Deliah Boston, RN Outcome: Progressing Goal: Will identify appropriate support needs 09/26/2020 0940 by Deliah Boston, RN Outcome: Progressing 09/26/2020 0903 by Deliah Boston, RN Outcome: Progressing  Problem: Health Behavior/Discharge Planning: Goal: Ability to manage health-related needs will improve 09/26/2020 0940 by Deliah Boston, RN Outcome: Progressing 09/26/2020 0903 by Deliah Boston, RN Outcome: Progressing   Problem: Ischemic Stroke/TIA Tissue Perfusion: Goal: Complications of ischemic stroke/TIA will be minimized 09/26/2020 0940 by Deliah Boston, RN Outcome: Progressing 09/26/2020 0903 by Deliah Boston, RN Outcome: Progressing

## 2020-09-27 DIAGNOSIS — Z66 Do not resuscitate: Secondary | ICD-10-CM

## 2020-09-27 DIAGNOSIS — Z515 Encounter for palliative care: Secondary | ICD-10-CM | POA: Diagnosis not present

## 2020-09-27 DIAGNOSIS — R627 Adult failure to thrive: Secondary | ICD-10-CM | POA: Diagnosis not present

## 2020-09-27 DIAGNOSIS — Z7189 Other specified counseling: Secondary | ICD-10-CM | POA: Diagnosis not present

## 2020-09-27 LAB — CBC WITH DIFFERENTIAL/PLATELET
Abs Immature Granulocytes: 0.08 10*3/uL — ABNORMAL HIGH (ref 0.00–0.07)
Basophils Absolute: 0.1 10*3/uL (ref 0.0–0.1)
Basophils Relative: 1 %
Eosinophils Absolute: 0.2 10*3/uL (ref 0.0–0.5)
Eosinophils Relative: 2 %
HCT: 27.8 % — ABNORMAL LOW (ref 36.0–46.0)
Hemoglobin: 8.7 g/dL — ABNORMAL LOW (ref 12.0–15.0)
Immature Granulocytes: 1 %
Lymphocytes Relative: 16 %
Lymphs Abs: 2.3 10*3/uL (ref 0.7–4.0)
MCH: 26 pg (ref 26.0–34.0)
MCHC: 31.3 g/dL (ref 30.0–36.0)
MCV: 83.2 fL (ref 80.0–100.0)
Monocytes Absolute: 1 10*3/uL (ref 0.1–1.0)
Monocytes Relative: 7 %
Neutro Abs: 10.5 10*3/uL — ABNORMAL HIGH (ref 1.7–7.7)
Neutrophils Relative %: 73 %
Platelets: 204 10*3/uL (ref 150–400)
RBC: 3.34 MIL/uL — ABNORMAL LOW (ref 3.87–5.11)
RDW: 18.3 % — ABNORMAL HIGH (ref 11.5–15.5)
WBC: 14.2 10*3/uL — ABNORMAL HIGH (ref 4.0–10.5)
nRBC: 0 % (ref 0.0–0.2)

## 2020-09-27 MED ORDER — HYDROCODONE-ACETAMINOPHEN 5-325 MG PO TABS: 1.0000 | ORAL_TABLET | Freq: Four times a day (QID) | ORAL | 0 refills | Status: AC | PRN

## 2020-09-27 MED ORDER — PROSOURCE PLUS PO LIQD: 30.0000 mL | Freq: Two times a day (BID) | ORAL | 0 refills | Status: AC

## 2020-09-27 MED ORDER — ENSURE ENLIVE PO LIQD: 237.0000 mL | Freq: Three times a day (TID) | ORAL | 0 refills | Status: AC

## 2020-09-27 MED ORDER — APIXABAN 5 MG PO TABS: 5.0000 mg | ORAL_TABLET | Freq: Two times a day (BID) | ORAL | 0 refills | Status: AC

## 2020-09-27 MED ORDER — BUSPIRONE HCL 5 MG PO TABS: 5.0000 mg | ORAL_TABLET | Freq: Two times a day (BID) | ORAL | 0 refills | Status: AC

## 2020-09-27 MED ORDER — OLANZAPINE 2.5 MG PO TABS: 2.5000 mg | ORAL_TABLET | Freq: Every day | ORAL | 0 refills | Status: AC

## 2020-09-27 NOTE — TOC Progression Note (Signed)
Transition of Care [facility]) - Progression Note    Patient Details  Name: Jennifer [name] MRN: [id number] Date of Birth: 06-25-1940  Transition of Care [facility]) CM/SW Contact  Baldemar Lenis, [place] Phone Number: 09/27/2020, 11:52 AM  Clinical Narrative:   CSW was planning on discharge to [facility] today and was notified by Admissions that they are unable to admit today due to a new covid outbreak, plan to admit patient tomorrow. CSW updated MD with barrier to discharge and spoke with patient's sister, Jennifer [name], to update. CSW asked about looking into other options, but Jennifer [name] would prefer to wait and move to [facility] tomorrow if they'll be able to admit tomorrow. CSW to continue to follow.    Expected Discharge Plan: Skilled Nursing Facility Barriers to Discharge: Continued Medical Work up,Insurance Authorization  Expected Discharge Plan and Services Expected Discharge Plan: Skilled Nursing Facility In-house Referral: Clinical Social Work Discharge Planning Services: CM Consult   Living arrangements for the past 2 months: Single Family Home (town house) Expected Discharge Date: 09/27/20                                     Social Determinants of Health (SDOH) Interventions    Readmission Risk Interventions No flowsheet data found.

## 2020-09-27 NOTE — Plan of Care (Signed)
  Problem: Clinical Measurements: Goal: Ability to maintain clinical measurements within normal limits will improve Outcome: Progressing Goal: Will remain free from infection Outcome: Progressing Goal: Diagnostic test results will improve Outcome: Progressing Goal: Respiratory complications will improve Outcome: Progressing Goal: Cardiovascular complication will be avoided Outcome: Progressing   Problem: Activity: Goal: Risk for activity intolerance will decrease Outcome: Progressing   Problem: Nutrition: Goal: Adequate nutrition will be maintained Outcome: Progressing   Problem: Coping: Goal: Level of anxiety will decrease Outcome: Progressing   Problem: Elimination: Goal: Will not experience complications related to bowel motility Outcome: Progressing Goal: Will not experience complications related to urinary retention Outcome: Progressing   Problem: Pain Managment: Goal: General experience of comfort will improve Outcome: Progressing   Problem: Safety: Goal: Ability to remain free from injury will improve Outcome: Progressing   Problem: Skin Integrity: Goal: Risk for impaired skin integrity will decrease Outcome: Progressing   Problem: Education: Goal: Individualized Educational Video(s) Outcome: Progressing   Problem: Coping: Goal: Will verbalize positive feelings about self Outcome: Progressing Goal: Will identify appropriate support needs Outcome: Progressing   Problem: Ischemic Stroke/TIA Tissue Perfusion: Goal: Complications of ischemic stroke/TIA will be minimized Outcome: Progressing

## 2020-09-27 NOTE — Discharge Summary (Addendum)
Physician Discharge Summary  TAKERA RAYL QIH:[id number] DOB: 12-25-1940 DOA: 09/15/2020  PCP: Pincus Sanes, MD  Admit date: 09/15/2020 Discharge date: 09/27/2020 30 Day Unplanned Readmission Risk Score   Flowsheet Row ED to Hosp-Admission (Current) from 09/15/2020 in [facility]  30 Day Unplanned Readmission Risk Score (%) 16.05 Filed at 09/27/2020 0801     This score is the patient's risk of an unplanned readmission within 30 days of being discharged (0 -100%). The score is based on dignosis, age, lab data, medications, orders, and past utilization.   Low:  0-14.9   Medium: 15-21.9   High: 22-29.9   Extreme: 30 and above         Admitted From: Home Disposition: SNF  Recommendations for Outpatient Follow-up:  1. Follow up with PCP in 1-2 weeks Follow-up MRI with contrast in approximately 3 weeks recommended.  2. Please obtain BMP/CBC in one week 3. Please follow up with your PCP on the following pending results: Unresulted Labs (From admission, onward)          Start     Ordered   09/27/20 0749  CBC with Differential/Platelet  Once,   R       Question:  Specimen collection method  Answer:  Lab=Lab collect   09/27/20 0748            Home Health: None Equipment/Devices: None  Discharge Condition: Stable CODE STATUS: DNR Diet recommendation: Cardiac  Subjective: Seen and examined.  Fully alert and mostly oriented.  Easily redirectable.  Has no complaints.  Brief/Interim Summary: 80 year old F with PMH of PVD, HTN, HLD, tobacco abuse and depression presented with generalized weakness, intermittent nausea and vomiting, poor appetite and unintentional weight loss for about 2 weeks, and admitted for FTT, depression with passive suicidally and AKI.  Patient was recently started on Lexapro.  MRI brain showed multifocal infarcts including cortical and subcortical areas of both cerebral and cerebellar hemispheres.  Patient was already outside tPA window.  TTE without significant finding.  CTA with widespread atherosclerosis with 65% stenosis of proximal left ICA.  Carotid US with 40 to 59% left ICA stenosis.  LE [place] positive for distal DVT.  Patient was started on Eliquis for anticoagulation and her aspirin as well as Pletal were discontinued.  Patient had a CT chest that showed new 1 cm lateral LUL nodule, ascending aortic aneurysm measuring 4.4 cm, partially visualized large AAA (chronic) and a large hiatal hernia. IR consulted for image guided biopsy of LUL nodule but recommended outpatient PET scan.  MRI brain with contrast with punctate multifocal enhancements which could be related to infarct or metastasis.  Follow-up MRI with contrast in approximately 4 weeks recommended.   Unfortunately, p.o. intake remained poor.  Patient and family not interested in tube feed.  She did not meet criteria for residential hospice.  Palliative medicine followed patient intermittently. TSH and CK within normal.  Weight somewhat stable from admission. Liberated diet-upright for meals given large hiatal hernia Started on Marinol for appetite but that did not help much. -Changed Seroquel to olanzapine -RD was following and she was started on nutrition supplements which she is being discharged on.   Major depression with passive suicidal ideation:  Seen by psychiatry and they Changed Seroquel to olanzapine given poor appetite  Delirium: Currently fully alert and mostly oriented. -Reorientation and delirium precautions  AKI/azotemia: Resolved.  Hypokalemia/hypomagnesemia/hypophosphatemia: Resolved  Essential hypertension: Normotensive for most part despite of holding amlodipine so amlodipine is being discontinued  at the time of discharge.  Ascending and abdominal aortic aneurysm/large AAA-not a surgical candidate. -Continue annual imaging  Emphysema/pulmonary scarring-likely from heavy smoking. -No respiratory symptoms  E. coli bacteriuria-patient  denies UTI symptoms.  No leukocytosis but mild temp to 99.8 on 5/26.  -Completed antibiotic course with Keflex on 6/2.  HLD Resume Lipitor.  PVD -On Eliquis.  High risk for bleeding to add antiplatelets  Tobacco use disorder -Encourage cessation -Nicotine patch  Thrombocytopenia: Resolved Last Labs         Recent Labs  Lab 09/20/20 0404 09/21/20 0239 09/23/20 0229 09/26/20 0146  PLT 127* 145* 204 220      Goal of care-guarded prognosis especially with poor p.o. intake.  Patient and family not interested in tube feed.  Family expressed interest in [facility].  TOC placed referral.  She did not qualify for hospice home.    She is being discharged to SNF.    Discharge Diagnoses:  Principal Problem:   Failure to thrive in adult Active Problems:   Dyslipidemia   Tobacco abuse   Essential hypertension   PAD (peripheral artery disease) (HCC)   Cerebral embolism with cerebral infarction   Protein-calorie malnutrition, severe    Discharge Instructions  Discharge Instructions    Ambulatory referral to Neurology   Complete by: As directed    Follow up with stroke clinic NP (Jessica Vanschaick or Darrol Angel, if both not available, consider Manson Allan, or [facility]) at [facility] in about 4 weeks. Thanks.     Allergies as of 09/27/2020   No Known Allergies     Medication List    STOP taking these medications   ALPRAZolam 0.25 MG tablet Commonly known as: XANAX   amLODipine 5 MG tablet Commonly known as: NORVASC   aspirin 81 MG tablet   cilostazol 50 MG tablet Commonly known as: PLETAL   escitalopram 5 MG tablet Commonly known as: Lexapro   ibuprofen 200 MG tablet Commonly known as: ADVIL     TAKE these medications   apixaban 5 MG Tabs tablet Commonly known as: ELIQUIS Take 1 tablet (5 mg total) by mouth 2 (two) times daily.   atorvastatin 20 MG tablet Commonly known as: LIPITOR Take 1 tablet by mouth daily.   busPIRone 5 MG tablet Commonly  known as: BUSPAR Take 1 tablet (5 mg total) by mouth 2 (two) times daily.   HYDROcodone-acetaminophen 5-325 MG tablet Commonly known as: NORCO/VICODIN Take 1 tablet by mouth every 6 (six) hours as needed for moderate pain.   OLANZapine 2.5 MG tablet Commonly known as: ZYPREXA Take 1 tablet (2.5 mg total) by mouth at bedtime.   omeprazole 40 MG capsule Commonly known as: PRILOSEC Take 1 capsule (40 mg total) by mouth daily.   potassium chloride 10 MEQ tablet Commonly known as: KLOR-CON Take 2 tablets (20 mEq total) by mouth daily. What changed:   how much to take  when to take this       Follow-up Information    [facility]. Schedule an appointment as soon as possible for a visit in 4 week(s).   Specialty: Neurology Contact information: [address] [place] [address] [phone]       Pincus Sanes, MD Follow up in 1 week(s).   Specialty: Internal Medicine Contact information: [address] [place] [address] [phone]        Kathleene Hazel, MD .   Specialty: Cardiology Contact information: [address] [place] [address] [phone]              No Known Allergies  Consultations: Palliative care   Procedures/Studies: CT ANGIO HEAD NECK W WO CM  Result Date: 09/16/2020 CLINICAL DATA:  Stroke. EXAM: CT ANGIOGRAPHY HEAD AND NECK TECHNIQUE: Multidetector CT imaging of the head and neck was performed using the standard protocol during bolus administration of intravenous contrast. Multiplanar CT image reconstructions and MIPs were obtained to evaluate the vascular anatomy. Carotid stenosis measurements (when applicable) are obtained utilizing NASCET criteria, using the distal internal carotid diameter as the denominator. CONTRAST:  67mL OMNIPAQUE IOHEXOL 350 MG/ML SOLN COMPARISON:  Head CT and MRI 09/15/2020 FINDINGS: CT HEAD FINDINGS Brain: Numerous scattered small  acute infarcts throughout the cerebrum and cerebellum bilaterally are better demonstrated on yesterday's MRI. No acute intracranial hemorrhage, mass, midline shift, or extra-axial fluid collection is identified. There is a background of moderately advanced chronic small vessel ischemia in the cerebral white matter. A chronic lacunar infarct is again noted in the right thalamus. Small chronic cerebellar infarcts shown on MRI are poorly demonstrated on CT due to motion and skull base artifact. There is moderate cerebral atrophy. Vascular: Calcified atherosclerosis at the skull base. Skull: No fracture within limitations of motion artifact. Left lateral skull osteoma. Sinuses: Persistent complete opacification of the left maxillary sinus and partial opacification of left anterior ethmoid air cells. No significant mastoid fluid. Orbits: Right cataract extraction. Review of the MIP images confirms the above findings CTA NECK FINDINGS Aortic arch: Incomplete imaging of the aortic arch including of the brachiocephalic and left common carotid origins. Diffuse calcified atherosclerotic plaque in the included portion of the arch and subclavian arteries. Characterization of a suspected underlying proximal left subclavian stenosis is limited by motion. Right carotid system: Patent with a small to moderate amount of calcified plaque in the proximal common carotid artery not resulting in a flow limiting stenosis. Prominent calcified plaque at the carotid bifurcation results in less than 50% stenosis of the origin of the ICA. Widely patent and mildly ectatic cervical ICA more distally. Left carotid system: Patent with moderate calcified plaque throughout the proximal and mid common carotid artery with assessment of the proximal common carotid artery limited by motion. Extensive calcified plaque at the carotid bifurcation resulting in approximately 65% proximal ICA stenosis with assessment limited by the heavy calcification of the  plaque which results in suboptimal visualization of the residual patent lumen. Widely patent and mildly ectatic cervical ICA more distally. Vertebral arteries: The vertebral arteries are patent and codominant. Motion artifact limits assessment for stenosis in the V1 and V2 segments. Skeleton: Advanced lower cervical disc degeneration. Other neck: Limited assessment of the soft tissues of the neck due to technique and motion. No gross mass or lymphadenopathy identified. Upper chest: Emphysema and scarring in the lung apices. Review of the MIP images confirms the above findings CTA HEAD FINDINGS Anterior circulation: The internal carotid arteries are patent from skull base to carotid termini with moderate atherosclerotic plaque bilaterally not resulting in a significant stenosis within limitations of artifact. Both cavernous segments are tortuous. ACAs and MCAs are patent without evidence of a proximal branch occlusion. There is calcified plaque in the proximal right M1 segment without evidence of an associated significant stenosis. ACA and MCA branch vessels appear diffusely attenuated and irregular bilaterally, and branch vessel assessment is limited by motion. No aneurysm is identified. Posterior circulation: The intracranial vertebral arteries are patent to the basilar with atherosclerotic irregularity  resulting in mild multifocal narrowing on the left. There may be more significant narrowing of the distal right V4 segment although the vessel is suboptimally visualized due to artifact. The basilar artery is patent with mild narrowing proximally. There are right larger than left posterior communicating arteries with a fetal origin noted of the right PCA. Both PCAs are patent proximally although there is marked attenuation and irregularity of PCA branch vessels bilaterally. There is also moderate to severe stenosis of the proximal left P2 segment. A small calcification is noted at the left P2 bifurcation. No  aneurysm is identified. Venous sinuses: Not well evaluated due to contrast timing. Anatomic variants: Fetal right PCA. Review of the MIP images confirms the above findings IMPRESSION: 1. Motion degraded study. 2. No emergent large vessel occlusion. 3. Widespread atherosclerosis with 65% stenosis of the proximal left ICA. 4. Aortic Atherosclerosis (ICD10-I70.0) and Emphysema (ICD10-J43.9). Electronically Signed   By: Sebastian Ache M.D.   On: 09/16/2020 20:08   DG Chest 2 View  Result Date: 09/15/2020 CLINICAL DATA:  Weakness and shortness of breath. EXAM: CHEST - 2 VIEW COMPARISON:  06/03/2019 FINDINGS: Again noted is kyphosis in the thoracic spine. Both lungs are clear. Heart size is stable. Evidence for hiatal hernia. In the interim, there has been bone cement placed in the L1 vertebral body. Difficult to exclude a new compression deformity at L2. Diffuse atherosclerotic calcifications in the thoracic aorta. IMPRESSION: 1. No acute cardiopulmonary disease. 2. Previous kyphoplasty at L1. Cannot exclude a new compression deformity at L2. Recommend dedicated lumbar spine images if this is an area of clinical concern. Electronically Signed   By: Richarda Overlie M.D.   On: 09/15/2020 11:55   CT Head Wo Contrast  Result Date: 09/15/2020 CLINICAL DATA:  Delirium EXAM: CT HEAD WITHOUT CONTRAST TECHNIQUE: Contiguous axial images were obtained from the base of the skull through the vertex without intravenous contrast. COMPARISON:  None. FINDINGS: Brain: No evidence of acute infarction, hemorrhage, hydrocephalus, extra-axial collection or mass lesion/mass effect. Diffuse cortical atrophy. Periventricular white matter hypodensity is a nonspecific finding, but most commonly relates to chronic ischemic small vessel disease. Vascular: No hyperdense vessel or unexpected calcification. Skull: Smoothly marginated protuberant mass extending laterally from the left side of the calvarium measuring 2.4 x 1.5 cm is likely an osteoma.  Legs aggressive characteristics such as periosteal reaction or cortical breakthrough. Sinuses/Orbits: Partial opacification of left anterior ethmoid air cells. Complete opacification left maxillary sinus. Hyperdense content of the left maxillary sinus may be due to inspissated mucus, hemorrhage, or fungal infection. Other: None. IMPRESSION: No acute intracranial abnormality Electronically Signed   By: Acquanetta Belling M.D.   On: 09/15/2020 13:17   CT Chest Wo Contrast  Result Date: 09/15/2020 CLINICAL DATA:  Persistent cough.  Weakness and shortness of breath. EXAM: CT CHEST WITHOUT CONTRAST TECHNIQUE: Multidetector CT imaging of the chest was performed following the standard protocol without IV contrast. COMPARISON:  Chest radiography same day.  CT angiography 06/03/2019. FINDINGS: Cardiovascular: Cardiomegaly. No pericardial fluid. Extensive coronary artery calcification and/or stents. Extensive aortic atherosclerotic calcification. Maximal diameter of the ascending aorta 4.4 cm, increased by 1 mm from the previous exam. Mediastinum/Nodes: No hilar or mediastinal mass or lymphadenopathy. Large hiatal hernia. Lungs/Pleura: Emphysema and pulmonary scarring as seen previously. Previous 6 mm density in the right upper lobe question previously looks more like a simple scar today. This is not a measurable nodule on today's exam. This is located in the region of image 46. Scarring at  both lung apices. No evidence of focal nodule or mass in the right lung. However, in the lateral aspect of the left upper lobe axial image 62, the patient has developed a 1 cm peripheral mass/nodule where there was only a small area of scarring previously. This is viewed with considerable concern given the rate of growth. No pleural effusion on either side. Upper Abdomen: Hiatal hernia as noted previously. Partial visualization of the abdominal aortic aneurysm measuring up to 6.6 cm in the portion imaged. This was not fully or completely  evaluated. Musculoskeletal: Chronic augmented fracture in the upper lumbar region. Old healed sternal fracture. No acute bone finding. IMPRESSION: Emphysema and pulmonary scarring. No sign of pneumonia, collapse or effusion. Newly seen 1 cm mass in the lateral left upper lobe, axial images 61 and 62. This was not present previously and is quite concerning for pulmonary malignancy. Consider one of the following in 3 months for both low-risk and high-risk individuals: (a) repeat chest CT, (b) follow-up PET-CT, or (c) tissue sampling. This recommendation follows the consensus statement: Guidelines for Management of Incidental Pulmonary Nodules Detected on CT Images: From the Fleischner Society 2017; Radiology 2017; 284:228-243. Previously seen density in the right upper lobe is no longer measurable and looks like a simple scar. Cardiomegaly, coronary artery calcification it advanced atherosclerosis of the aorta. Maximal diameter of the ascending aorta increased from 4.3 cm to 4.4 cm. Recommend annual imaging followup by CTA or MRA. This recommendation follows 2010 ACCF/AHA/AATS/ACR/ASA/SCA/SCAI/SIR/STS/SVM Guidelines for the Diagnosis and Management of Patients with Thoracic Aortic Disease. Circulation. 2010; 121: Z610-R604. Aortic aneurysm NOS (ICD10-I71.9) Partial visualization of a large abdominal aortic aneurysm as seen previously, not completely evaluated today. Only the upper portion is visualized. Large hiatal hernia. Aortic Atherosclerosis (ICD10-I70.0) and Emphysema (ICD10-J43.9). Electronically Signed   By: Paulina Fusi M.D.   On: 09/15/2020 14:35   MR BRAIN WO CONTRAST  Result Date: 09/15/2020 CLINICAL DATA:  Initial evaluation for neuro deficit, stroke suspected. EXAM: MRI HEAD WITHOUT CONTRAST TECHNIQUE: Multiplanar, multiecho pulse sequences of the brain and surrounding structures were obtained without intravenous contrast. COMPARISON:  Prior CT from earlier the same day. FINDINGS: Brain: Diffuse  prominence of the CSF containing spaces compatible with generalized cerebral atrophy, moderately advanced in nature. Patchy and confluent T2/FLAIR hyperintensity involving the periventricular and deep white matter both cerebral hemispheres as well as the pons, most consistent with chronic small vessel ischemic disease, also moderately advanced. Few scattered superimposed remote lacunar infarcts present about the thalami and pons. Multiple scattered predominantly subcentimeter foci of restricted diffusion seen involving the cortical and subcortical aspects of both cerebral and cerebellar hemispheres, consistent with multifocal ischemic infarcts. Scattered patchy involvement of the deep gray nuclei and brainstem as well. For reference purposes, the most prominent discrete focus seen involving the right periatrial white matter and measures 7 mm. No associated hemorrhage or significant mass effect. Multiple scattered underlying chronic micro hemorrhages noted, predominantly clustered about the cerebellum, brainstem, and thalami, likely related to poorly controlled hypertension. No mass lesion, midline shift or mass effect. No hydrocephalus or extra-axial fluid collection. Pituitary gland suprasellar region within normal limits. Midline structures intact. Vascular: Major intracranial vascular flow voids are maintained. Skull and upper cervical spine: Craniocervical junction within normal limits. Bone marrow signal intensity normal. No scalp soft tissue abnormality. Sinuses/Orbits: Patient status post ocular lens replacement on the right. Globes and orbital soft tissues demonstrate no acute finding. Chronic left ethmoidal and maxillary sinusitis noted. Trace bilateral mastoid effusions, of doubtful  significance. Inner ear structures grossly normal. Other: None. IMPRESSION: 1. Multiple scattered predominantly subcentimeter foci of restricted diffusion involving the cortical and subcortical aspects of both cerebral and  cerebellar hemispheres, consistent with multifocal ischemic infarcts. No associated hemorrhage or significant mass effect. 2. Underlying moderately advanced cerebral atrophy with chronic microvascular ischemic disease, with multiple additional remote lacunar infarcts about the bilateral thalami and pons. 3. Multiple chronic micro hemorrhages clustered about the cerebellum, brainstem, and thalami, suggesting chronic poorly controlled hypertension. Electronically Signed   By: Rise Mu M.D.   On: 09/15/2020 19:58   MR BRAIN W CONTRAST  Result Date: 09/19/2020 CLINICAL DATA:  Brain mass or lesion. EXAM: MRI HEAD WITH CONTRAST TECHNIQUE: Multiplanar, multiecho pulse sequences of the brain and surrounding structures were obtained with intravenous contrast. CONTRAST:  4mL GADAVIST GADOBUTROL 1 MMOL/ML IV SOLN COMPARISON:  MRI Sep 15, 2020. FINDINGS: Postcontrast imaging performed due to left upper lobe mass seen on recent CT chest. There is punctate enhancement associated with many of the areas of restricted diffusion on recent MRI. For example, there are enhancing lesions in the high bilateral frontal lobes in the precentral gyrus (series 44, image 5). Additional area of enhancing left cerebellum (series 5, image 15), right cerebellum (series 5, image 10), and right anterior basal ganglia (series 5, image 31). Please see recent MRI for additional findings. IMPRESSION: Postcontrast imaging performed due to left upper lobe mass seen on recent CT chest. There is punctate multifocal enhancement at many of the sites of restricted diffusion seen on recent MRI head. These areas of enhancement could relate to enhancement of acute/subacute infarcts or metastases. A follow-up MRI with contrast in approximately 4 weeks is recommended to further evaluate. Electronically Signed   By: Feliberto Harts MD   On: 09/19/2020 14:06   ECHOCARDIOGRAM COMPLETE  Result Date: 09/16/2020    ECHOCARDIOGRAM REPORT   Patient  Name:   MADDALYNN BARNARD Date of Exam: 09/16/2020 Medical Rec #:  [id number]        Height:       64.0 in Accession #:    [id number]       Weight:       82.9 lb Date of Birth:  [birth year]        BSA:          1.345 m Patient Age:    80 years         BP:           151/78 mmHg Patient Gender: F                HR:           72 bpm. Exam Location:  Inpatient Procedure: 2D Echo, Cardiac Doppler and Color Doppler Indications:    Stroke  History:        Patient has prior history of Echocardiogram examinations, most                 recent 01/03/2017. Signs/Symptoms:Altered Mental Status.  Sonographer:    Roosvelt Maser RDCS Referring Phys: [id number] Almon Hercules  Sonographer Comments: Technically difficult due to no cooperation. Patient wanted to remain curled up in a ball and be left alone. Difficult due to patient is 82 lbs. Difficult windows. IMPRESSIONS  1. Very difficult study due to poor patient cooperation due to altered mental status. Globally LV function appears normal, but not all walls were not assessed. Very limited evaluation of cardiac valves as the patient could not cooperate with  the exam.  2. Left ventricular ejection fraction, by estimation, is 60 to 65%. The left ventricle has normal function. Left ventricular endocardial border not optimally defined to evaluate regional wall motion. There is moderate asymmetric left ventricular hypertrophy of the basal-septal segment. Left ventricular diastolic function could not be evaluated.  3. Right ventricular systolic function is normal. The right ventricular size is normal.  4. The mitral valve is grossly normal. No evidence of mitral valve regurgitation. No evidence of mitral stenosis.  5. The aortic valve is calcified. Aortic valve regurgitation is mild. Mild to moderate aortic valve sclerosis/calcification is present, without any evidence of aortic stenosis. FINDINGS  Left Ventricle: Left ventricular ejection fraction, by estimation, is 60 to 65%. The left ventricle  has normal function. Left ventricular endocardial border not optimally defined to evaluate regional wall motion. The left ventricular internal cavity size was normal in size. There is moderate asymmetric left ventricular hypertrophy of the basal-septal segment. Left ventricular diastolic function could not be evaluated due to nondiagnostic images. Left ventricular diastolic function could not be evaluated. Right Ventricle: The right ventricular size is normal. No increase in right ventricular wall thickness. Right ventricular systolic function is normal. Left Atrium: Left atrial size was normal in size. Right Atrium: Right atrial size was normal in size. Pericardium: Trivial pericardial effusion is present. Mitral Valve: The mitral valve is grossly normal. Mild mitral annular calcification. No evidence of mitral valve regurgitation. No evidence of mitral valve stenosis. Tricuspid Valve: The tricuspid valve is not well visualized. Aortic Valve: The aortic valve is calcified. Aortic valve regurgitation is mild. Mild to moderate aortic valve sclerosis/calcification is present, without any evidence of aortic stenosis. Pulmonic Valve: The pulmonic valve was not well visualized. Aorta: The aortic root and ascending aorta are structurally normal, with no evidence of dilitation. IAS/Shunts: The atrial septum is grossly normal.  LEFT VENTRICLE PLAX 2D LVIDd:         3.60 cm LVIDs:         2.60 cm LV PW:         1.00 cm LV IVS:        1.40 cm LVOT diam:     1.90 cm LV SV:         51 LV SV Index:   38 LVOT Area:     2.84 cm  LEFT ATRIUM           Index LA diam:      3.50 cm 2.60 cm/m LA Vol (A4C): 27.1 ml 20.14 ml/m  AORTIC VALVE LVOT Vmax:   106.00 cm/s LVOT Vmean:  67.900 cm/s LVOT VTI:    0.180 m  AORTA Ao Root diam: 2.90 cm  SHUNTS Systemic VTI:  0.18 m Systemic Diam: 1.90 cm Lennie Odor MD Electronically signed by Lennie Odor MD Signature Date/Time: 09/16/2020/4:38:43 PM    Final    VAS US CAROTID  Result Date:  09/17/2020 Carotid Arterial Duplex Study Patient Name:  RAILEE BONILLAS  Date of Exam:   09/17/2020 Medical Rec #: [id number]         Accession #:    [id number] Date of Birth: 27-Apr-1940         Patient Gender: F Patient Age:   80Y Exam Location:  [facility] Procedure:      VAS US CAROTID Referring Phys: [id number] MCNEILL P KIRKPATRICK --------------------------------------------------------------------------------  Indications:       CVA. Risk Factors:      Hypertension, hyperlipidemia, current smoker, PAD. Other  Factors:     Prediabetic. Comparison Study:  12-17-2019 Prior carotid duplex bilateral showed 1-39%                    stenosis of the RT ICA and 40-59% stenosis of the LT ICA. Performing Technologist: Jean Rosenthal RDMS,RVT  Examination Guidelines: A complete evaluation includes B-mode imaging, spectral Doppler, color Doppler, and power Doppler as needed of all accessible portions of each vessel. Bilateral testing is considered an integral part of a complete examination. Limited examinations for reoccurring indications may be performed as noted.  Right Carotid Findings: +----------+--------+--------+--------+-------------------------+--------+           PSV cm/sEDV cm/sStenosisPlaque Description       Comments +----------+--------+--------+--------+-------------------------+--------+ CCA Prox  84      12              calcific                          +----------+--------+--------+--------+-------------------------+--------+ CCA Mid   90      15              calcific                          +----------+--------+--------+--------+-------------------------+--------+ CCA Distal57      12              heterogenous and calcific         +----------+--------+--------+--------+-------------------------+--------+ ICA Prox  60      17      1-39%   heterogenous and calcific         +----------+--------+--------+--------+-------------------------+--------+ ICA Distal47      16                                        tortuous +----------+--------+--------+--------+-------------------------+--------+ ECA       70                                                        +----------+--------+--------+--------+-------------------------+--------+ +----------+--------+-------+----------------+-------------------+           PSV cm/sEDV cmsDescribe        Arm Pressure (mmHG) +----------+--------+-------+----------------+-------------------+ [id number]            Multiphasic, WNL                    +----------+--------+-------+----------------+-------------------+ +---------+--------+--+--------+-+---------+ VertebralPSV cm/s48EDV cm/s6Antegrade +---------+--------+--+--------+-+---------+  Left Carotid Findings: +----------+--------+--------+--------+-------------------------+--------+           PSV cm/sEDV cm/sStenosisPlaque Description       Comments +----------+--------+--------+--------+-------------------------+--------+ CCA Prox  63      12              calcific and heterogenous         +----------+--------+--------+--------+-------------------------+--------+ CCA Mid   49      10              calcific                          +----------+--------+--------+--------+-------------------------+--------+ ICA Prox  257     52      40-59%  calcific and heterogenous         +----------+--------+--------+--------+-------------------------+--------+  ICA Mid   68      17                                                +----------+--------+--------+--------+-------------------------+--------+ ICA Distal51      15                                       tortuous +----------+--------+--------+--------+-------------------------+--------+ ECA       49                                                        +----------+--------+--------+--------+-------------------------+--------+  +----------+--------+--------+----------------+-------------------+           PSV cm/sEDV cm/sDescribe        Arm Pressure (mmHG) +----------+--------+--------+----------------+-------------------+ [id number]             Multiphasic, WNL                    +----------+--------+--------+----------------+-------------------+ +---------+--------+--+--------+--+---------+ VertebralPSV cm/s46EDV cm/s12Antegrade +---------+--------+--+--------+--+---------+   Summary: Right Carotid: Velocities in the right ICA are consistent with a 1-39% stenosis.                Non-hemodynamically significant plaque <50% noted in the CCA. Left Carotid: Velocities in the left ICA are consistent with a 40-59% stenosis.               Non-hemodynamically significant plaque <50% noted in the CCA. Vertebrals:  Bilateral vertebral arteries demonstrate antegrade flow. Subclavians: Normal flow hemodynamics were seen in bilateral subclavian              arteries. *See table(s) above for measurements and observations.  Electronically signed by Sherald Hesshristopher Clark MD on 09/17/2020 at 4:31:07 PM.    Final    VAS US LOWER EXTREMITY VENOUS (DVT)  Result Date: 09/19/2020  Lower Venous DVT Study Patient Name:  Vito BackersSUSAN L Mangiapane  Date of Exam:   09/19/2020 Medical Rec #: [id number]         Accession #:    [id number] Date of Birth: 06/30/1940         Patient Gender: F Patient Age:   80Y [facility] Procedure:      VAS US LOWER EXTREMITY VENOUS (DVT) Referring Phys: [id number] Marvel PlanJINDONG XU --------------------------------------------------------------------------------  Indications: Stroke.  Limitations: Calcific shadowing. Comparison Study: No previous exams Performing Technologist: Ernestene MentionJody Hill  Examination Guidelines: A complete evaluation includes B-mode imaging, spectral Doppler, color Doppler, and power Doppler as needed of all accessible portions of each vessel. Bilateral testing is considered an integral part of a  complete examination. Limited examinations for reoccurring indications may be performed as noted. The reflux portion of the exam is performed with the patient in reverse Trendelenburg.  +---------+---------------+---------+-----------+----------+--------------+ RIGHT    CompressibilityPhasicitySpontaneityPropertiesThrombus Aging +---------+---------------+---------+-----------+----------+--------------+ CFV      Full           Yes      Yes                                 +---------+---------------+---------+-----------+----------+--------------+  SFJ      Full                                                        +---------+---------------+---------+-----------+----------+--------------+ FV Prox  Full           Yes      Yes                                 +---------+---------------+---------+-----------+----------+--------------+ FV Mid   Full           Yes      Yes                                 +---------+---------------+---------+-----------+----------+--------------+ FV DistalFull           Yes      Yes                                 +---------+---------------+---------+-----------+----------+--------------+ PFV      Full                                                        +---------+---------------+---------+-----------+----------+--------------+ POP      Full           Yes      Yes                                 +---------+---------------+---------+-----------+----------+--------------+ PTV      Full                                                        +---------+---------------+---------+-----------+----------+--------------+ PERO     Full                                                        +---------+---------------+---------+-----------+----------+--------------+   +---------+---------------+---------+-----------+----------+--------------+ LEFT     CompressibilityPhasicitySpontaneityPropertiesThrombus Aging  +---------+---------------+---------+-----------+----------+--------------+ CFV      Full           Yes      Yes                                 +---------+---------------+---------+-----------+----------+--------------+ SFJ      Full                                                        +---------+---------------+---------+-----------+----------+--------------+  FV Prox  Full           Yes      Yes                                 +---------+---------------+---------+-----------+----------+--------------+ FV Mid   Full           Yes      Yes                                 +---------+---------------+---------+-----------+----------+--------------+ FV DistalFull           Yes      Yes                                 +---------+---------------+---------+-----------+----------+--------------+ PFV      Full                                                        +---------+---------------+---------+-----------+----------+--------------+ POP      Full           Yes      Yes                                 +---------+---------------+---------+-----------+----------+--------------+ PTV      Full                                                        +---------+---------------+---------+-----------+----------+--------------+ PERO     None                                         One of paired  +---------+---------------+---------+-----------+----------+--------------+ Gastroc  None                                         One of paired  +---------+---------------+---------+-----------+----------+--------------+     Summary: BILATERAL: -No evidence of popliteal cyst, bilaterally. RIGHT: - There is no evidence of deep vein thrombosis in the lower extremity. - There is no evidence of superficial venous thrombosis.  LEFT: - Findings consistent with acute deep vein thrombosis involving the left peroneal veins, and left gastrocnemius veins. - There is no evidence  of superficial venous thrombosis.  *See table(s) above for measurements and observations. Electronically signed by Sherald Hess MD on 09/19/2020 at 10:20:10 AM.    Final       Discharge Exam: Vitals:   09/27/20 0325 09/27/20 0759  BP: (!) 154/64 (!) 142/64  Pulse: 83 80  Resp: 18 20  Temp: 97.9 F (36.6 C)   SpO2: 98% 96%   Vitals:   09/26/20 2333 09/27/20 0325 09/27/20 0500 09/27/20 0759  BP: (!) 107/51 (!) 154/64  (!) 142/64  Pulse: 73 83  80  Resp:  16 18  20   Temp: 98 F (36.7 C) 97.9 F (36.6 C)    TempSrc:  Axillary    SpO2: 94% 98%  96%  Weight:   39.5 kg     General: Pt is alert, awake, not in acute distress Cardiovascular: RRR, S1/S2 +, no rubs, no gallops Respiratory: CTA bilaterally, no wheezing, no rhonchi Abdominal: Soft, NT, ND, bowel sounds + Extremities: no edema, no cyanosis Neuro: No clear-cut focal deficit but she has left-sided neglect for upper and lower extremity.   The results of significant diagnostics from this hospitalization (including imaging, microbiology, ancillary and laboratory) are listed below for reference.     Microbiology: Recent Results (from the past 240 hour(s))  Resp Panel by RT-PCR (Flu A&B, Covid) Nasopharyngeal Swab     Status: None   Collection Time: 09/25/20 10:00 PM   Specimen: Nasopharyngeal Swab; Nasopharyngeal(NP) swabs in vial transport medium  Result Value Ref Range Status   SARS Coronavirus 2 by RT PCR NEGATIVE NEGATIVE Final    Comment: (NOTE) SARS-CoV-2 target nucleic acids are NOT DETECTED.  The SARS-CoV-2 RNA is generally detectable in upper respiratory specimens during the acute phase of infection. The lowest concentration of SARS-CoV-2 viral copies this assay can detect is 138 copies/mL. A negative result does not preclude SARS-Cov-2 infection and should not be used as the sole basis for treatment or other patient management decisions. A negative result may occur with  improper specimen  collection/handling, submission of specimen other than nasopharyngeal swab, presence of viral mutation(s) within the areas targeted by this assay, and inadequate number of viral copies(<138 copies/mL). A negative result must be combined with clinical observations, patient history, and epidemiological information. The expected result is Negative.  Fact Sheet for Patients:  11/25/20  Fact Sheet for Healthcare Providers:  BloggerCourse.com  This test is no t yet approved or cleared by the [place] FDA and  has been authorized for detection and/or diagnosis of SARS-CoV-2 by FDA under an Emergency Use Authorization (EUA). This EUA will remain  in effect (meaning this test can be used) for the duration of the COVID-19 declaration under Section 564(b)(1) of the Act, 21 U.S.C.section 360bbb-3(b)(1), unless the authorization is terminated  or revoked sooner.       Influenza A by PCR NEGATIVE NEGATIVE Final   Influenza B by PCR NEGATIVE NEGATIVE Final    Comment: (NOTE) The Xpert Xpress SARS-CoV-2/FLU/RSV plus assay is intended as an aid in the diagnosis of influenza from Nasopharyngeal swab specimens and should not be used as a sole basis for treatment. Nasal washings and aspirates are unacceptable for Xpert Xpress SARS-CoV-2/FLU/RSV testing.  Fact Sheet for Patients: [place]  Fact Sheet for Healthcare Providers: BloggerCourse.com  This test is not yet approved or cleared by the [place] FDA and has been authorized for detection and/or diagnosis of SARS-CoV-2 by FDA under an Emergency Use Authorization (EUA). This EUA will remain in effect (meaning this test can be used) for the duration of the COVID-19 declaration under Section 564(b)(1) of the Act, 21 U.S.C. section 360bbb-3(b)(1), unless the authorization is terminated or revoked.  Performed at [facility], [address], [place], [place]      Labs: BNP (last 3 results) No results for input(s): BNP in the last 8760 hours. Basic Metabolic Panel: Recent Labs  Lab 09/22/20 0319 09/23/20 0229 09/24/20 0113 09/24/20 1547 09/25/20 0816 09/26/20 0146  NA 143 144 148* 144 139 136  K 3.9 4.1 4.6 3.8  3.9 4.0  CL 104 104 110 104 102 99  CO2 GLUCOSE 112* 114* 118* 120* 114* 102*  BUN 46* 45* 54* 52* 51* 47*  CREATININE 1.16* 1.16* 1.02* 1.19* 1.18* 1.08*  CALCIUM 9.4 9.5 9.7 9.5 9.0 8.9  MG 2.2 2.5* 2.6*  --  2.3 2.4  PHOS 3.8 4.8* 5.2* 5.0* 4.4 4.4   Liver Function Tests: Recent Labs  Lab 09/23/20 0229 09/24/20 0113 09/24/20 1547 09/25/20 0816 09/26/20 0146  ALBUMIN 2.8* 2.9* 2.9* 2.7* 2.8*   No results for input(s): LIPASE, AMYLASE in the last 168 hours. No results for input(s): AMMONIA in the last 168 hours. CBC: Recent Labs  Lab 09/21/20 0239 09/22/20 0319 09/23/20 0229 09/24/20 0113 09/26/20 0146  WBC 8.7  --  9.5  --  14.3*  HGB 8.8* 8.9* 8.9* 9.1* 9.1*  HCT 27.5* 27.9* 28.4* 29.9* 29.0*  MCV 81.1  --  81.8  --  82.4  PLT 145*  --  204  --  220   Cardiac Enzymes: No results for input(s): CKTOTAL, CKMB, CKMBINDEX, TROPONINI in the last 168 hours. BNP: Invalid input(s): POCBNP CBG: No results for input(s): GLUCAP in the last 168 hours. D-Dimer No results for input(s): DDIMER in the last 72 hours. Hgb A1c No results for input(s): HGBA1C in the last 72 hours. Lipid Profile No results for input(s): CHOL, HDL, LDLCALC, TRIG, CHOLHDL, LDLDIRECT in the last 72 hours. Thyroid function studies No results for input(s): TSH, T4TOTAL, T3FREE, THYROIDAB in the last 72 hours.  Invalid input(s): FREET3 Anemia work up No results for input(s): VITAMINB12, FOLATE, FERRITIN, TIBC, IRON, RETICCTPCT in the last 72 hours. Urinalysis    Component Value Date/Time   COLORURINE YELLOW 09/16/2020 1522   APPEARANCEUR HAZY (A)  09/16/2020 1522   LABSPEC 1.018 09/16/2020 1522   PHURINE 7.0 09/16/2020 1522   GLUCOSEU NEGATIVE 09/16/2020 1522   GLUCOSEU NEGATIVE 04/21/2015 1135   HGBUR LARGE (A) 09/16/2020 1522   BILIRUBINUR NEGATIVE 09/16/2020 1522   KETONESUR NEGATIVE 09/16/2020 1522   PROTEINUR NEGATIVE 09/16/2020 1522   UROBILINOGEN 0.2 04/21/2015 1135   NITRITE NEGATIVE 09/16/2020 1522   LEUKOCYTESUR TRACE (A) 09/16/2020 1522   Sepsis Labs Invalid input(s): PROCALCITONIN,  WBC,  LACTICIDVEN Microbiology Recent Results (from the past 240 hour(s))  Resp Panel by RT-PCR (Flu A&B, Covid) Nasopharyngeal Swab     Status: None   Collection Time: 09/25/20 10:00 PM   Specimen: Nasopharyngeal Swab; Nasopharyngeal(NP) swabs in vial transport medium  Result Value Ref Range Status   SARS Coronavirus 2 by RT PCR NEGATIVE NEGATIVE Final    Comment: (NOTE) SARS-CoV-2 target nucleic acids are NOT DETECTED.  The SARS-CoV-2 RNA is generally detectable in upper respiratory specimens during the acute phase of infection. The lowest concentration of SARS-CoV-2 viral copies this assay can detect is 138 copies/mL. A negative result does not preclude SARS-Cov-2 infection and should not be used as the sole basis for treatment or other patient management decisions. A negative result may occur with  improper specimen collection/handling, submission of specimen other than nasopharyngeal swab, presence of viral mutation(s) within the areas targeted by this assay, and inadequate number of viral copies(<138 copies/mL). A negative result must be combined with clinical observations, patient history, and epidemiological information. The expected result is Negative.  Fact Sheet for Patients:  BloggerCourse.com  Fact Sheet for Healthcare Providers:  [employer]  This test is no t yet approved or cleared by the [place] and  has been authorized for detection and/or  diagnosis of SARS-CoV-2 by FDA under an Emergency Use Authorization (EUA). This EUA will remain  in effect (meaning this test can be used) for the duration of the COVID-19 declaration under Section 564(b)(1) of the Act, 21 U.S.C.section 360bbb-3(b)(1), unless the authorization is terminated  or revoked sooner.       Influenza A by PCR NEGATIVE NEGATIVE Final   Influenza B by PCR NEGATIVE NEGATIVE Final    Comment: (NOTE) The Xpert Xpress SARS-CoV-2/FLU/RSV plus assay is intended as an aid in the diagnosis of influenza from Nasopharyngeal swab specimens and should not be used as a sole basis for treatment. Nasal washings and aspirates are unacceptable for Xpert Xpress SARS-CoV-2/FLU/RSV testing.  Fact Sheet for Patients: BloggerCourse.com  Fact Sheet for Healthcare Providers: [employer]ious[employer]ker.[employer]  This test is not yet approved or cleared by the [place] FDA and has been authorized for detection and/or diagnosis of SARS-CoV-2 by FDA under an Emergency Use Authorization (EUA). This EUA will remain in effect (meaning this test can be used) for the duration of the COVID-19 declaration under Section 564(b)(1) of the Act, 21 U.S.C. section 360bbb-3(b)(1), unless the authorization is terminated or revoked.  Performed at [facility], [address] [place], [place] [address]      Time coordinating discharge: Over 30 minutes  SIGNED:   Hughie Closs, MD  [facility] 09/27/2020, 10:08 AM  If 7PM-7AM, please contact night-coverage [email or web address]

## 2020-09-27 NOTE — Progress Notes (Signed)
Physical Therapy Treatment Patient Details Name: Jennifer [name] MRN: [id number] DOB: [birth year] Today's Date: 09/27/2020    History of Present Illness Pt is a 80 y.o. F who presents with weakess, frequent falls, and confusion on 09/15/20. CT head negative for acute abnormality. MRIMRI brain showed multifocal infarcts including cortical and subcortical areas of both cerebral and cerebellar hemispheres.Patient has a CT chest that showed new 1 cm lateral LUL nodule, ascending aortic aneurysm measuring 4.4 cm. MRI brain with contrast with punctate multifocal enhancements which could be related to infarct or metastasis.  LLE [place] positive for left distal DVT. Significant PMH: HTN, osteoporosis, L1 compression fx s/p kyphoplasty.    PT Comments    Pt restless, pleasant, and speaking nearly nonsensically upon PT arrival to room. Pt expresses desire to get up, but states it will be difficult. Pt requiring max assist for bed mobility tasks today, significantly impaired by command following difficulty, debility, and dizziness with mobility. Pt tolerated EOB sitting x3 minutes before fatiguing and reporting dizziness worsening, requesting lay back down. PT assisted pt in pericare clean up, as pt soiled in urine/stool. Will continue to follow acutely.    Follow Up Recommendations  SNF;Supervision/Assistance - 24 hour     Equipment Recommendations  3in1 (PT)    Recommendations for Other Services       Precautions / Restrictions Precautions Precautions: Fall Restrictions Weight Bearing Restrictions: No    Mobility  Bed Mobility Overal bed mobility: Needs Assistance Bed Mobility: Supine to Sit;Sit to Supine Rolling: Max assist   Supine to sit: Max assist Sit to supine: Max assist   General bed mobility comments: max assist for trunk and LE management, scooting to/from EOB, and boost up in bed upon return to supine. Pt found to be soiled in stool and urine upon return to supine, max assist for  rolling bilaterally for truncal translation, step-by-step cuing.    Transfers                 General transfer comment: NT - pt lifting LEs back into bed without warning and laying down.  Ambulation/Gait                 Stairs             Wheelchair Mobility    Modified Rankin (Stroke Patients Only) Modified Rankin (Stroke Patients Only) Pre-Morbid Rankin Score: Moderately severe disability Modified Rankin: Severe disability     Balance Overall balance assessment: Needs assistance Sitting-balance support: No upper extremity supported;Feet supported Sitting balance-Leahy Scale: Fair     Standing balance support: Bilateral upper extremity supported;During functional activity Standing balance-Leahy Scale: Poor Standing balance comment: Reliant on UE and external support                            Cognition Arousal/Alertness: Awake/alert Behavior During Therapy: WFL for tasks assessed/performed Overall Cognitive Status: No family/caregiver present to determine baseline cognitive functioning Area of Impairment: Following commands;Safety/judgement;Problem solving;Awareness;Memory;Orientation;Attention                 Orientation Level: Disoriented to;Time;Situation Current Attention Level: Focused Memory: Decreased short-term memory Following Commands: Follows one step commands inconsistently Safety/Judgement: Decreased awareness of safety;Decreased awareness of deficits Awareness: Intellectual Problem Solving: Requires verbal cues General Comments: Pt very difficult to keep on task, attempts to move from sit>supine multiple times without warning. Pt talking nonsensically throughout session, does however state name and location at [facility]". Follows  one-step commands ~50% of the time      Exercises      General Comments General comments (skin integrity, edema, etc.): dysconjugate gaze appreciated, pt with difficulty focusing  on PT face, socks presented to pt, BSC at bedside. Pt reporting dizziness when moving supine<>sit, pt closing eyes tightly during transitional movemetns.      Pertinent Vitals/Pain Pain Assessment: Faces Faces Pain Scale: Hurts a little bit Pain Location: back - chronic Pain Descriptors / Indicators: Sore;Discomfort Pain Intervention(s): Limited activity within patient's tolerance;Monitored during session;Repositioned    Home Living                      Prior Function            PT Goals (current goals can now be found in the care plan section) Acute Rehab PT Goals Patient Stated Goal: hoping to try rehab to get better PT Goal Formulation: With patient Time For Goal Achievement: 09/29/20 Potential to Achieve Goals: Fair Progress towards PT goals: Not progressing toward goals - comment (confusion, poor activity tolerance)    Frequency    Min 3X/week      PT Plan Current plan remains appropriate    Co-evaluation              AM-PAC PT "6 Clicks" Mobility   Outcome Measure  Help needed turning from your back to your side while in a flat bed without using bedrails?: A Lot Help needed moving from lying on your back to sitting on the side of a flat bed without using bedrails?: A Lot Help needed moving to and from a bed to a chair (including a wheelchair)?: A Lot Help needed standing up from a chair using your arms (e.g., wheelchair or bedside chair)?: A Lot Help needed to walk in hospital room?: Total Help needed climbing 3-5 steps with a railing? : Total 6 Click Score: 10    End of Session   Activity Tolerance: Treatment limited secondary to medical complications (Comment);Patient limited by fatigue (dizziness) Patient left: in bed;with call bell/phone within reach;with bed alarm set;with SCD's reapplied Nurse Communication: Mobility status PT Visit Diagnosis: Other abnormalities of gait and mobility (R26.89);Muscle weakness (generalized) (M62.81)      Time: 8242-3536 PT Time Calculation (min) (ACUTE ONLY): 21 min  Charges:  $Therapeutic Activity: 8-22 mins                    Marye Round, PT DPT Acute Rehabilitation Services Pager [phone]  Office [phone]  [name] 09/27/2020, 11:05 AM

## 2020-09-27 NOTE — Progress Notes (Signed)
   Palliative Medicine Inpatient Follow Up Note  Reason for consult:  Goals of Care  HPI:  Per intake H&P --> 80 year old F with PMH of PVD, HTN, HLDanddepression presenting with generalized weakness, intermittent nausea and vomiting, poor appetite and unintentional weight loss for about 2 weeks, andadmitted for FTT, depression with passivesuicidally andAKI.Identified by MRI to have multifocal bilateral cerebral and cerebellar stroke(s).  Palliative care has been asked to get involved in the setting of ongoing failure to thrive to further address and discuss goals of care.  Today's Discussion (09/27/2020):  *Please note that this is a verbal dictation therefore any spelling or grammatical errors are due to the "[place] One" system interpretation.  Chart reviewed.  I met with Shanara at bedside, she was eating some oatmeal. Darenda reports to me that she is starting to "feel better". We reviewed the plan for discharge to [facility]. Discussed that she will need to put her best effort forward for rehabilitation which she concurs with. Lashanti denies complaints this morning.   Objective Assessment: Vital Signs Vitals:   09/27/20 0325 09/27/20 0759  BP: (!) 154/64 (!) 142/64  Pulse: 83 80  Resp: 18 20  Temp: 97.9 F (36.6 C)   SpO2: 98% 96%   No intake or output data in the 24 hours ending 09/27/20 1058 Last Weight  Most recent update: 09/27/2020  5:41 AM   Weight  39.5 kg (87 lb 1.3 oz)           Gen:  Elderly F in NAD HEENT: moist mucous membranes CV: Regular rate and rhythm  PULM: clear to auscultation bilaterally  ABD: soft/nontender  EXT: No edema  Neuro: Alert and oriented x2-3  SUMMARY OF RECOMMENDATIONS DNAR/DNI  MOST/DNR in [place] for transition to [employer] have OP Palliative support through [facility] - if patients does not thrive she should be transitioned to hospice. Patients family is aware of the cost that would be  associated with this.   Palliative care will continue to incrementally follow though goals at this time are clear  Time Spent: 15 Greater than 50% of the time was spent in counseling and coordination of care ______________________________________________________________________________________ [facility] Team Team Cell Phone: [phone] Please utilize secure chat with additional questions, if there is no response within 30 minutes please call the above phone number  Palliative Medicine Team providers are available by phone from 7am to 7pm daily and can be reached through the team cell phone.  Should this patient require assistance outside of these hours, please call the patient's attending physician.

## 2020-09-28 LAB — SARS CORONAVIRUS 2 (TAT 6-24 HRS): SARS Coronavirus 2: NEGATIVE

## 2020-09-28 NOTE — TOC Progression Note (Signed)
Transition of Care [facility]) - Progression Note    Patient Details  Name: JACKYE DEVER MRN: [id number] Date of Birth: February 01, 1941  Transition [facility] CM/SW Contact  Baldemar Lenis, [place] Phone Number: 09/28/2020, 2:44 PM  Clinical Narrative:   CSW contacted by [facility] this morning that they are not unable to take admissions, and unsure when they will be. CSW spoke with patient's sister and discussed alternative plans about picking another SNF. CSW reached out to [facility], [facility], and [facility] to review referral. [facility] has no beds, [facility] unsure about bed availability but will get back to CSW, and [facility] is able to offer a bed. CSW spoke with sister to update and she would like to accept offer at [facility]. CSW contacted [facility] about changing the SNF choice on the authorization, and [facility] will now allow that, needs a new authorization request. CSW sent everything in to [facility] for review. CSW to follow.    Expected Discharge Plan: Skilled Nursing Facility Barriers to Discharge: Insurance Authorization  Expected Discharge Plan and Services Expected Discharge Plan: Skilled Nursing Facility In-house Referral: Clinical Social Work Discharge Planning Services: CM Consult   Living arrangements for the past 2 months: Single Family Home (town house) Expected Discharge Date: 09/27/20                                     Social Determinants of Health (SDOH) Interventions    Readmission Risk Interventions No flowsheet data found.

## 2020-09-28 NOTE — Progress Notes (Signed)
Nutrition Follow-up  DOCUMENTATION CODES:   Severe malnutrition in context of chronic illness  INTERVENTION:   Continue Ensure Enlive po TID, each supplement provides 350 kcal and 20 grams of protein  Continue Magic cup TID with meals, each supplement provides 290 kcal and 9 grams of protein  Continue 52ml Prosource Plus po BID, each supplement provides 100 kcals and 15 grams of protein  Continue MVI with minerals daily  NUTRITION DIAGNOSIS:   Severe Malnutrition related to chronic illness (anorexia nervosa) as evidenced by energy intake < or equal to 75% for > or equal to 1 month,severe fat depletion,severe muscle depletion,percent weight loss. -- ongoing   GOAL:   Patient will meet greater than or equal to 90% of their needs - progressing  MONITOR:   PO intake,Supplement acceptance,Labs,Weight trends,I & O's  REASON FOR ASSESSMENT:   Consult Calorie Count,Assessment of nutrition requirement/status  ASSESSMENT:   Pt admitted with FTT, depression with passive SI, and AKI. Pt identified by MRI to have multifocal bilateral cerebral and cerebellar stroke(s). PMH includes PVD, HTN, HLD, and depression.   Pt has had continued poor po intake since last RD assessment, but pt/family are declining nutrition support. Pt did not meet criteria for residential hospice. Palliative Medicine team following intermittently. Per MD, pt is stable for discharge to SNF once bed is available. Pt was supposed to leave yesterday, but SNF facility she was supposed to go to had to turn pt away due to new COVID outbreak at the facility.   PO Intake: 10-50% meal completion (31% average meal intake) Doing well with supplements per RN. Recommend continue current nutrition plan of care.   UOP: 3x unmeasured occurrences x24 hours Stool: 4x unmeasured occurrences x24 hours  Medications: . (feeding supplement) PROSource Plus  30 mL Oral BID BM  . apixaban  5 mg Oral BID  . atorvastatin  40 mg Oral Daily   . busPIRone  5 mg Oral BID  . docusate sodium  100 mg Oral BID  . dronabinol  2.5 mg Oral BID AC  . feeding supplement  237 mL Oral TID BM  . multivitamin with minerals  1 tablet Oral Daily  . OLANZapine  2.5 mg Oral QHS  . pantoprazole  40 mg Oral Daily   Recent Labs  Lab 09/24/20 0113 09/24/20 1547 09/25/20 0816 09/26/20 0146  NA 148* 144 139 136  K 4.6 3.8 3.9 4.0  CL 110 104 102 99  CO2 31 29 27 27   BUN 54* 52* 51* 47*  CREATININE 1.02* 1.19* 1.18* 1.08*  CALCIUM 9.7 9.5 9.0 8.9  MG 2.6*  --  2.3 2.4  PHOS 5.2* 5.0* 4.4 4.4  GLUCOSE 118* 120* 114* 102*   Diet Order:   Diet Order            Diet regular Room service appropriate? Yes; Fluid consistency: Thin  Diet effective now                 EDUCATION NEEDS:   Education needs have been addressed  Skin:  Skin Assessment: Reviewed RN Assessment  Last BM:  6/6  Height:   Ht Readings from Last 1 Encounters:  09/07/20 5\' 4"  (1.626 m)    Weight:   Wt Readings from Last 10 Encounters:  09/28/20 39.7 kg  09/07/20 38.6 kg  07/05/20 42.2 kg  04/01/20 40.6 kg  03/03/20 41.6 kg  12/08/19 38.8 kg  08/04/19 39.9 kg  07/05/19 42.2 kg  07/02/19 42.2 kg  07/02/19  42.2 kg   BMI:  Body mass index is 15.02 kg/m.  Estimated Nutritional Needs:   Kcal:  1300-1500  Protein:  65-75g  Fluid:  >1.3L/d    [name], MS, RD, LDN RD pager number and weekend/on-call pager number located in Amion.

## 2020-09-28 NOTE — Progress Notes (Signed)
PROGRESS NOTE  Jennifer [name] MAU:[id number] DOB: 06/12/1940   PCP: Pincus Sanes, MD  Patient is from: Home.  Lives alone.  DOA: 09/15/2020 LOS: 12  Chief complaints: Generalized weakness  Brief Narrative / Interim history: 80 year old F with PMH of PVD, HTN, HLD, tobacco abuse and depression presenting with generalized weakness, intermittent nausea and vomiting, poor appetite and unintentional weight loss for about 2 weeks, and admitted for FTT, depression with passive suicidally and AKI.  Patient was recently started on Lexapro.  MRI brain showed multifocal infarcts including cortical and subcortical areas of both cerebral and cerebellar hemispheres.  Patient is already outside tPA window. TTE without significant finding.  CTA with widespread atherosclerosis with 65% stenosis of proximal left ICA.  Carotid US with 40 to 59% left ICA stenosis.  LE [place] positive for distal DVT.  Patient was started on Eliquis for anticoagulation.  Patient has a CT chest that showed new 1 cm lateral LUL nodule, ascending aortic aneurysm measuring 4.4 cm, partially visualized large AAA (chronic) and a large hiatal hernia. IR consulted for image guided biopsy of LUL nodule but recommended outpatient PET scan.  MRI brain with contrast with punctate multifocal enhancements which could be related to infarct or metastasis.  Follow-up MRI with contrast in approximately 4 weeks recommended.   Unfortunately, p.o. intake remained poor.  Patient and family not interested in tube feed.  She did not meet criteria for residential hospice.  Palliative medicine following intermittently.  Plan is discharge to SNF for trial of therapy when bed available.  She continues to have waxing and waning mental status.   Subjective: Patient seen and examined.  Sleepy but easily arousable.  Once awake, she was alert and oriented x2.  Denied any complaint.  Objective: Vitals:   09/27/20 2345 09/28/20 0246 09/28/20 0325 09/28/20 0711   BP: (!) 125/49  (!) 119/58 124/64  Pulse: 64  72 78  Resp: 18  18 16   Temp: 97.6 F (36.4 C)  97.9 F (36.6 C) 97.6 F (36.4 C)  TempSrc: Oral  Oral Oral  SpO2: 97%  100% 99%  Weight:  39.7 kg      Intake/Output Summary (Last 24 hours) at 09/28/2020 1322 Last data filed at 09/28/2020 0900 Gross per 24 hour  Intake 100 ml  Output --  Net 100 ml   Filed Weights   09/26/20 0439 09/27/20 0500 09/28/20 0246  Weight: 40.1 kg 39.5 kg 39.7 kg    Examination:  General exam: Appears calm and comfortable  Respiratory system: Clear to auscultation. Respiratory effort normal. Cardiovascular system: S1 & S2 heard, RRR. No JVD, murmurs, rubs, gallops or clicks. No pedal edema. Gastrointestinal system: Abdomen is nondistended, soft and nontender. No organomegaly or masses felt. Normal bowel sounds heard. Central nervous system: Alert and oriented x2.  Left-sided neglect Extremities: Symmetric 5 x 5 power. Skin: No rashes, lesions or ulcers.  Psychiatry: Judgement and insight appear poor  Procedures:  None  Microbiology summarized: COVID-19 and influenza PCR nonreactive.  Assessment & Plan: Failure to thrive in adult/history of anorexia nervosa- seems to be psychogenic but CVA, possible lung malignancy and cognitive decline could be contributing as well. TSH and CK within normal.  Oral intake remains poor. Patient and family not interested in tube feed.  Weight somewhat stable from admission. -Liberated diet-upright for meals given large hiatal hernia -Continue Marinol for appetite -Changed Seroquel to olanzapine -RD and palliative medicine following  Major depression with passive suicidal ideation:  -Appreciate input  by psychiatry-stopped Lexapro out of concern for nausea and vomiting -Changed Seroquel to olanzapine given poor appetite -Psychiatry signed off.  Multifocal bilateral cerebral and cerebellar embolic stroke-noted on MRI brain. LE [place] positive for left distal DVT.  Has  left-sided neglect. she was already outside tPA window.  TTE without significant finding or PFO.  CTA with 65% left ICA stenosis but 40 to 59% on carotid US -On Eliquis 5 mg twice daily.  -Continue statin  Delirium: Currently fully alert and oriented x2.  No signs of delirium.  Delirium precautions.  LLE DVT-LE [place] with left peroneal and gastrocnemius DVT. -Eliquis as above  AKI/azotemia: Likely due to poor p.o. intake. Recent Labs    09/17/20 0355 09/18/20 0129 09/19/20 0247 09/20/20 0404 09/22/20 0319 09/23/20 0229 09/24/20 0113 09/24/20 1547 09/25/20 0816 09/26/20 0146  BUN 15 19 18  24* 46* 45* 54* 52* 51* 47*  CREATININE 0.99 1.17* 0.92 1.13* 1.16* 1.16* 1.02* 1.19* 1.18* 1.08*  -Continue monitoring  Normocytic anemia: H&H relatively stable.  Anemia panel basically normal Recent Labs    09/17/20 0355 09/18/20 0129 09/19/20 0247 09/20/20 0404 09/21/20 0239 09/22/20 0319 09/23/20 0229 09/24/20 0113 09/26/20 0146 09/27/20 0952  HGB 9.0* 8.1* 8.4* 9.0* 8.8* 8.9* 8.9* 9.1* 9.1* 8.7*  -Monitor intermittently  Hypokalemia/hypomagnesemia/hypophosphatemia: Resolved  Essential hypertension: Normotensive for most part. -Continue holding home amlodipine  LUL nodule concerning for malignancy given patient's heavy smoking history-about 1 cm LUL nodule seen on CT chest. MRI brain with contrast with punctate multifocal enhancements which could be related to infarct or metastasis.   -Follow-up MRI with contrast in approximately 4 weeks recommended.  -IR consulted for image guided biopsy but felt to be high risk and recommended outpatient PT.  Ascending and abdominal aortic aneurysm/large AAA-not a surgical candidate. -Continue annual imaging  Emphysema/pulmonary scarring-likely from heavy smoking. -No respiratory symptoms  E. coli bacteriuria-patient denies UTI symptoms.  No leukocytosis but mild temp to 99.8 on 5/26.  -Completed antibiotic course with Keflex on  6/2.  HLD Resume Lipitor.  PVD -On Eliquis.  High risk for bleeding to add antiplatelets  Tobacco use disorder -Encourage cessation -Nicotine patch  PS: Patient was discharged to a skilled nursing facility on 09/27/2020 however after the discharge summary was completed, I was notified by [facility] that [facility] is having COVID outbreak and stopped excepting new patients.  TOC is working with family/sister in order to look for other choices.  Again, patient is medically ready for discharge anytime bed is available.  Thrombocytopenia: Resolved Recent Labs  Lab 09/23/20 0229 09/26/20 0146 09/27/20 0952  PLT 204 220 204    Goal of care-guarded prognosis especially with poor p.o. intake.  Patient and family not interested in tube feed.  Family expressed interest in [facility].  TOC placed referral.  She did not qualify for hospice home.  Plan for discharge to SNF. -Palliative medicine following.  Severe malnutrition/FTT-decreased appetite with unintentional weight loss. Body mass index is 15.02 kg/m. Nutrition Problem: Severe Malnutrition Etiology: chronic illness (anorexia nervosa) Signs/Symptoms: energy intake < or equal to 75% for > or equal to 1 month,severe fat depletion,severe muscle depletion,percent weight loss Percent weight loss: 11 % (over 6 months) Interventions: Ensure Enlive (each supplement provides 350kcal and 20 grams of protein),Magic cup,Prostat,MVI,Calorie Count,Education   DVT prophylaxis:  On Eliquis for DVT  Code Status: DNR/DNI Family Communication: None present at bedside.  Level of care: Telemetry Medical  Status is: Inpatient  Remains inpatient appropriate because:Unsafe d/c plan  Dispo: The patient is from: Home              Anticipated d/c is to: SNF              Patient currently is medically stable to d/c.   Difficult to place patient No   Consultants:  Neurology-signed off Palliative medicine Psychiatry-signed off   Sch  Meds:  Scheduled Meds: . (feeding supplement) PROSource Plus  30 mL Oral BID BM  . apixaban  5 mg Oral BID  . atorvastatin  40 mg Oral Daily  . busPIRone  5 mg Oral BID  . docusate sodium  100 mg Oral BID  . dronabinol  2.5 mg Oral BID AC  . feeding supplement  237 mL Oral TID BM  . multivitamin with minerals  1 tablet Oral Daily  . OLANZapine  2.5 mg Oral QHS  . pantoprazole  40 mg Oral Daily   Continuous Infusions:  PRN Meds:.acetaminophen **OR** acetaminophen, albuterol, bisacodyl, HYDROcodone-acetaminophen, morphine injection, nicotine, ondansetron **OR** ondansetron (ZOFRAN) IV, polyethylene glycol  Antimicrobials: Anti-infectives (From admission, onward)   Start     Dose/Rate Route Frequency Ordered Stop   09/20/20 0600  cephALEXin (KEFLEX) capsule 500 mg        500 mg Oral Every 8 hours 09/19/20 1316 09/23/20 2152   09/19/20 0900  cefTRIAXone (ROCEPHIN) 1 g in sodium chloride 0.9 % 100 mL IVPB  Status:  Discontinued        1 g 200 mL/hr over 30 Minutes Intravenous Every 24 hours 09/19/20 0811 09/19/20 1316       I have personally reviewed the following labs and images: CBC: Recent Labs  Lab 09/22/20 0319 09/23/20 0229 09/24/20 0113 09/26/20 0146 09/27/20 0952  WBC  --  9.5  --  14.3* 14.2*  NEUTROABS  --   --   --   --  10.5*  HGB 8.9* 8.9* 9.1* 9.1* 8.7*  HCT 27.9* 28.4* 29.9* 29.0* 27.8*  MCV  --  81.8  --  82.4 83.2  PLT  --  204  --  220 204   BMP &GFR Recent Labs  Lab 09/22/20 0319 09/23/20 0229 09/24/20 0113 09/24/20 1547 09/25/20 0816 09/26/20 0146  NA 143 144 148* 144 139 136  K 3.9 4.1 4.6 3.8 3.9 4.0  CL 104 104 110 104 102 99  CO2 26 30 31 29 27 27   GLUCOSE 112* 114* 118* 120* 114* 102*  BUN 46* 45* 54* 52* 51* 47*  CREATININE 1.16* 1.16* 1.02* 1.19* 1.18* 1.08*  CALCIUM 9.4 9.5 9.7 9.5 9.0 8.9  MG 2.2 2.5* 2.6*  --  2.3 2.4  PHOS 3.8 4.8* 5.2* 5.0* 4.4 4.4   Estimated Creatinine Clearance: 25.2 mL/min (A) (by C-G formula based on SCr  of 1.08 mg/dL (H)). Liver & Pancreas: Recent Labs  Lab 09/23/20 0229 09/24/20 0113 09/24/20 1547 09/25/20 0816 09/26/20 0146  ALBUMIN 2.8* 2.9* 2.9* 2.7* 2.8*   No results for input(s): LIPASE, AMYLASE in the last 168 hours. No results for input(s): AMMONIA in the last 168 hours. Diabetic: No results for input(s): HGBA1C in the last 72 hours. No results for input(s): GLUCAP in the last 168 hours. Cardiac Enzymes: No results for input(s): CKTOTAL, CKMB, CKMBINDEX, TROPONINI in the last 168 hours. No results for input(s): PROBNP in the last 8760 hours. Coagulation Profile: No results for input(s): INR, PROTIME in the last 168 hours. Thyroid Function Tests: No results for input(s): TSH, T4TOTAL, FREET4, T3FREE, THYROIDAB in  the last 72 hours. Lipid Profile: No results for input(s): CHOL, HDL, LDLCALC, TRIG, CHOLHDL, LDLDIRECT in the last 72 hours. Anemia Panel: No results for input(s): VITAMINB12, FOLATE, FERRITIN, TIBC, IRON, RETICCTPCT in the last 72 hours. Urine analysis:    Component Value Date/Time   COLORURINE YELLOW 09/16/2020 1522   APPEARANCEUR HAZY (A) 09/16/2020 1522   LABSPEC 1.018 09/16/2020 1522   PHURINE 7.0 09/16/2020 1522   GLUCOSEU NEGATIVE 09/16/2020 1522   GLUCOSEU NEGATIVE 04/21/2015 1135   HGBUR LARGE (A) 09/16/2020 1522   BILIRUBINUR NEGATIVE 09/16/2020 1522   KETONESUR NEGATIVE 09/16/2020 1522   PROTEINUR NEGATIVE 09/16/2020 1522   UROBILINOGEN 0.2 04/21/2015 1135   NITRITE NEGATIVE 09/16/2020 1522   LEUKOCYTESUR TRACE (A) 09/16/2020 1522   Sepsis Labs: Invalid input(s): PROCALCITONIN, LACTICIDVEN  Microbiology: Recent Results (from the past 240 hour(s))  Resp Panel by RT-PCR (Flu A&B, Covid) Nasopharyngeal Swab     Status: None   Collection Time: 09/25/20 10:00 PM   Specimen: Nasopharyngeal Swab; Nasopharyngeal(NP) swabs in vial transport medium  Result Value Ref Range Status   SARS Coronavirus 2 by RT PCR NEGATIVE NEGATIVE Final    Comment:  (NOTE) SARS-CoV-2 target nucleic acids are NOT DETECTED.  The SARS-CoV-2 RNA is generally detectable in upper respiratory specimens during the acute phase of infection. The lowest concentration of SARS-CoV-2 viral copies this assay can detect is 138 copies/mL. A negative result does not preclude SARS-Cov-2 infection and should not be used as the sole basis for treatment or other patient management decisions. A negative result may occur with  improper specimen collection/handling, submission of specimen other than nasopharyngeal swab, presence of viral mutation(s) within the areas targeted by this assay, and inadequate number of viral copies(<138 copies/mL). A negative result must be combined with clinical observations, patient history, and epidemiological information. The expected result is Negative.  Fact Sheet for Patients:  BloggerCourse.comhttps://www.fda.gov/media/152166/download  Fact Sheet for Healthcare Providers:  SeriousBroker.ithttps://www.fda.gov/media/152162/download  This test is no t yet approved or cleared by the [place] FDA and  has been authorized for detection and/or diagnosis of SARS-CoV-2 by FDA under an Emergency Use Authorization (EUA). This EUA will remain  in effect (meaning this test can be used) for the duration of the COVID-19 declaration under Section 564(b)(1) of the Act, 21 U.S.C.section 360bbb-3(b)(1), unless the authorization is terminated  or revoked sooner.       Influenza A by PCR NEGATIVE NEGATIVE Final   Influenza B by PCR NEGATIVE NEGATIVE Final    Comment: (NOTE) The Xpert Xpress SARS-CoV-2/FLU/RSV plus assay is intended as an aid in the diagnosis of influenza from Nasopharyngeal swab specimens and should not be used as a sole basis for treatment. Nasal washings and aspirates are unacceptable for Xpert Xpress SARS-CoV-2/FLU/RSV testing.  Fact Sheet for Patients: BloggerCourse.comhttps://www.fda.gov/media/152166/download  Fact Sheet for Healthcare  Providers: SeriousBroker.ithttps://www.fda.gov/media/152162/download  This test is not yet approved or cleared by the [place] FDA and has been authorized for detection and/or diagnosis of SARS-CoV-2 by FDA under an Emergency Use Authorization (EUA). This EUA will remain in effect (meaning this test can be used) for the duration of the COVID-19 declaration under Section 564(b)(1) of the Act, 21 U.S.C. section 360bbb-3(b)(1), unless the authorization is terminated or revoked.  Performed at [facility], [address] [place], [place] [address]     Radiology Studies: No results found.  Hughie Clossavi Yuki Purves, MD [facility]  If 7PM-7AM, please contact night-coverage [email or web address] 09/28/2020, 1:22 PM

## 2020-09-28 NOTE — Plan of Care (Signed)
  Problem: Education: Goal: Knowledge of General Education information will improve Description: Including pain rating scale, medication(s)/side effects and non-pharmacologic comfort measures Outcome: Progressing   Problem: Health Behavior/Discharge Planning: Goal: Ability to manage health-related needs will improve Outcome: Progressing   Problem: Clinical Measurements: Goal: Ability to maintain clinical measurements within normal limits will improve Outcome: Progressing Goal: Will remain free from infection Outcome: Progressing Goal: Diagnostic test results will improve Outcome: Progressing Goal: Respiratory complications will improve Outcome: Progressing Goal: Cardiovascular complication will be avoided Outcome: Progressing   Problem: Activity: Goal: Risk for activity intolerance will decrease Outcome: Progressing   Problem: Nutrition: Goal: Adequate nutrition will be maintained Outcome: Progressing   Problem: Coping: Goal: Level of anxiety will decrease Outcome: Progressing   Problem: Elimination: Goal: Will not experience complications related to bowel motility Outcome: Progressing Goal: Will not experience complications related to urinary retention Outcome: Progressing   Problem: Pain Managment: Goal: General experience of comfort will improve Outcome: Progressing   Problem: Safety: Goal: Ability to remain free from injury will improve Outcome: Progressing   Problem: Skin Integrity: Goal: Risk for impaired skin integrity will decrease Outcome: Progressing   Problem: Education: Goal: Knowledge of disease or condition will improve Outcome: Progressing Goal: Knowledge of secondary prevention will improve Outcome: Progressing Goal: Knowledge of patient specific risk factors addressed and post discharge goals established will improve Outcome: Progressing Goal: Individualized Educational Video(s) Outcome: Progressing   Problem: Coping: Goal: Will verbalize  positive feelings about self Outcome: Progressing Goal: Will identify appropriate support needs Outcome: Progressing   Problem: Health Behavior/Discharge Planning: Goal: Ability to manage health-related needs will improve Outcome: Progressing   Problem: Ischemic Stroke/TIA Tissue Perfusion: Goal: Complications of ischemic stroke/TIA will be minimized Outcome: Progressing

## 2020-09-29 ENCOUNTER — Encounter: Payer: Self-pay | Admitting: Internal Medicine

## 2020-09-29 DIAGNOSIS — I69328 Other speech and language deficits following cerebral infarction: Secondary | ICD-10-CM | POA: Diagnosis not present

## 2020-09-29 DIAGNOSIS — R8271 Bacteriuria: Secondary | ICD-10-CM | POA: Diagnosis not present

## 2020-09-29 DIAGNOSIS — I69828 Other speech and language deficits following other cerebrovascular disease: Secondary | ICD-10-CM | POA: Diagnosis not present

## 2020-09-29 DIAGNOSIS — R531 Weakness: Secondary | ICD-10-CM | POA: Diagnosis not present

## 2020-09-29 DIAGNOSIS — E43 Unspecified severe protein-calorie malnutrition: Secondary | ICD-10-CM | POA: Diagnosis not present

## 2020-09-29 DIAGNOSIS — M6281 Muscle weakness (generalized): Secondary | ICD-10-CM | POA: Diagnosis not present

## 2020-09-29 DIAGNOSIS — G459 Transient cerebral ischemic attack, unspecified: Secondary | ICD-10-CM | POA: Diagnosis not present

## 2020-09-29 DIAGNOSIS — J439 Emphysema, unspecified: Secondary | ICD-10-CM | POA: Diagnosis not present

## 2020-09-29 DIAGNOSIS — R5381 Other malaise: Secondary | ICD-10-CM | POA: Diagnosis not present

## 2020-09-29 DIAGNOSIS — E876 Hypokalemia: Secondary | ICD-10-CM | POA: Diagnosis not present

## 2020-09-29 DIAGNOSIS — Z7401 Bed confinement status: Secondary | ICD-10-CM | POA: Diagnosis not present

## 2020-09-29 DIAGNOSIS — M545 Low back pain, unspecified: Secondary | ICD-10-CM | POA: Diagnosis not present

## 2020-09-29 DIAGNOSIS — Z743 Need for continuous supervision: Secondary | ICD-10-CM | POA: Diagnosis not present

## 2020-09-29 DIAGNOSIS — R1312 Dysphagia, oropharyngeal phase: Secondary | ICD-10-CM | POA: Diagnosis not present

## 2020-09-29 DIAGNOSIS — I69391 Dysphagia following cerebral infarction: Secondary | ICD-10-CM | POA: Diagnosis not present

## 2020-09-29 DIAGNOSIS — G8929 Other chronic pain: Secondary | ICD-10-CM | POA: Diagnosis not present

## 2020-09-29 DIAGNOSIS — R627 Adult failure to thrive: Secondary | ICD-10-CM | POA: Diagnosis not present

## 2020-09-29 DIAGNOSIS — Z20822 Contact with and (suspected) exposure to covid-19: Secondary | ICD-10-CM | POA: Diagnosis not present

## 2020-09-29 DIAGNOSIS — R279 Unspecified lack of coordination: Secondary | ICD-10-CM | POA: Diagnosis not present

## 2020-09-29 DIAGNOSIS — E785 Hyperlipidemia, unspecified: Secondary | ICD-10-CM | POA: Diagnosis not present

## 2020-09-29 DIAGNOSIS — I69398 Other sequelae of cerebral infarction: Secondary | ICD-10-CM | POA: Diagnosis not present

## 2020-09-29 LAB — BASIC METABOLIC PANEL
Anion gap: 11 (ref 5–15)
BUN: 46 mg/dL — ABNORMAL HIGH (ref 8–23)
CO2: 28 mmol/L (ref 22–32)
Calcium: 9.3 mg/dL (ref 8.9–10.3)
Chloride: 106 mmol/L (ref 98–111)
Creatinine, Ser: 1.21 mg/dL — ABNORMAL HIGH (ref 0.44–1.00)
GFR, Estimated: 45 mL/min — ABNORMAL LOW (ref >60)
Glucose, Bld: 103 mg/dL — ABNORMAL HIGH (ref 70–99)
Potassium: 3.5 mmol/L (ref 3.5–5.1)
Sodium: 145 mmol/L (ref 135–145)

## 2020-09-29 LAB — CBC WITH DIFFERENTIAL/PLATELET
Abs Immature Granulocytes: 0.06 10*3/uL (ref 0.00–0.07)
Basophils Absolute: 0.1 10*3/uL (ref 0.0–0.1)
Basophils Relative: 1 %
Eosinophils Absolute: 0.2 10*3/uL (ref 0.0–0.5)
Eosinophils Relative: 1 %
HCT: 29.6 % — ABNORMAL LOW (ref 36.0–46.0)
Hemoglobin: 9.3 g/dL — ABNORMAL LOW (ref 12.0–15.0)
Immature Granulocytes: 1 %
Lymphocytes Relative: 20 %
Lymphs Abs: 2.3 10*3/uL (ref 0.7–4.0)
MCH: 26.3 pg (ref 26.0–34.0)
MCHC: 31.4 g/dL (ref 30.0–36.0)
MCV: 83.9 fL (ref 80.0–100.0)
Monocytes Absolute: 1 10*3/uL (ref 0.1–1.0)
Monocytes Relative: 9 %
Neutro Abs: 7.9 10*3/uL — ABNORMAL HIGH (ref 1.7–7.7)
Neutrophils Relative %: 68 %
Platelets: 190 10*3/uL (ref 150–400)
RBC: 3.53 MIL/uL — ABNORMAL LOW (ref 3.87–5.11)
RDW: 18.7 % — ABNORMAL HIGH (ref 11.5–15.5)
WBC: 11.5 10*3/uL — ABNORMAL HIGH (ref 4.0–10.5)
nRBC: 0 % (ref 0.0–0.2)

## 2020-09-29 NOTE — TOC Transition Note (Signed)
Transition of Care [facility]) - CM/SW Discharge Note   Patient Details  Name: Jennifer [name] MRN: [id number] Date of Birth: [birth year]  Transition of Care [facility] CM/SW Contact:  Baldemar Lenis, LCSW Phone Number: 09/29/2020, 11:44 AM   Clinical Narrative:   Nurse to call report to [phone], Room 108.    Final next level of care: Skilled Nursing Facility Barriers to Discharge: Barriers Resolved   Patient Goals and CMS Choice Patient states their goals for this hospitalization and ongoing recovery are:: to get stronger before going home      Discharge Placement              Patient chooses bed at: [facility] Patient to be transferred to facility by: [facility] Name of family member notified: Marisue Ivan Patient and family notified of of transfer: 09/29/20  Discharge Plan and Services In-house Referral: Clinical Social Work Discharge Planning Services: CM Consult                                 Social Determinants of Health (SDOH) Interventions     Readmission Risk Interventions No flowsheet data found.

## 2020-09-29 NOTE — Progress Notes (Signed)
Report called to [facility], all questions answered.

## 2020-09-29 NOTE — Discharge Summary (Signed)
Physician Discharge Summary  Jennifer [name] WUJ:[id number] DOB: [birth year] DOA: 09/15/2020  PCP: Pincus Sanes, MD  Admit date: 09/15/2020 Discharge date: 09/29/2020 30 Day Unplanned Readmission Risk Score   Flowsheet Row ED to Hosp-Admission (Current) from 09/15/2020 in [facility]  30 Day Unplanned Readmission Risk Score (%) 16.05 Filed at 09/27/2020 0801     This score is the patient's risk of an unplanned readmission within 30 days of being discharged (0 -100%). The score is based on dignosis, age, lab data, medications, orders, and past utilization.   Low:  0-14.9   Medium: 15-21.9   High: 22-29.9   Extreme: 30 and above         Admitted From: Home Disposition: SNF  Recommendations for Outpatient Follow-up:  1. Follow up with PCP in 1-2 weeks Follow-up MRI with contrast in approximately 3 weeks recommended.  2. Please obtain BMP/CBC in one week 3. Please follow up with your PCP on the following pending results: Unresulted Labs (From admission, onward)         None        Home Health: None Equipment/Devices: None  Discharge Condition: Stable CODE STATUS: DNR Diet recommendation: Cardiac  Subjective: Seen and examined.  Fully alert and mostly oriented.  Easily redirectable.  Has no complaints.  Brief/Interim Summary: 80 year old F with PMH of PVD, HTN, HLD, tobacco abuse and depression presented with generalized weakness, intermittent nausea and vomiting, poor appetite and unintentional weight loss for about 2 weeks, and admitted for FTT, depression with passive suicidally and AKI.  Patient was recently started on Lexapro.  MRI brain showed multifocal infarcts including cortical and subcortical areas of both cerebral and cerebellar hemispheres.  Patient was already outside tPA window. TTE without significant finding.  CTA with widespread atherosclerosis with 65% stenosis of proximal left ICA.  Carotid US with 40 to 59% left ICA stenosis.  LE [place]  positive for distal DVT.  Patient was started on Eliquis for anticoagulation and her aspirin as well as Pletal were discontinued.  Patient had a CT chest that showed new 1 cm lateral LUL nodule, ascending aortic aneurysm measuring 4.4 cm, partially visualized large AAA (chronic) and a large hiatal hernia. IR consulted for image guided biopsy of LUL nodule but recommended outpatient PET scan.  MRI brain with contrast with punctate multifocal enhancements which could be related to infarct or metastasis.  Follow-up MRI with contrast in approximately 4 weeks recommended.   Unfortunately, p.o. intake remained poor.  Patient and family not interested in tube feed.  She did not meet criteria for residential hospice.  Palliative medicine followed patient intermittently. TSH and CK within normal.  Weight somewhat stable from admission. Liberated diet-upright for meals given large hiatal hernia Started on Marinol for appetite but that did not help much. -Changed Seroquel to olanzapine -RD was following and she was started on nutrition supplements which she is being discharged on.  PS: Patient was initially discharged on 09/27/2020 however after discharge summary and paperwork was completed, we were notified by [facility] that they were not accepting new admissions due to COVID outbreak.  They continue to refuse new admissions on 09/28/2020 as well and then family agreed to start looking at other places and patient has now been accepted to another SNF and insurance authorization has been obtained and she is a stable so she is going to be discharged today.  Of note patient was noted to have some left-sided neglect for the last couple of  days but today she was able to lift her left upper and lower extremities although it was slightly weaker on the left side.   Major depression with passive suicidal ideation:  Seen by psychiatry and they Changed Seroquel to olanzapine given poor appetite  Delirium: Currently  fully alert and mostly oriented. -Reorientation and delirium precautions  AKI/azotemia: Resolved.  Hypokalemia/hypomagnesemia/hypophosphatemia: Resolved  Essential hypertension: Normotensive for most part despite of holding amlodipine so amlodipine is being discontinued at the time of discharge.  Ascending and abdominal aortic aneurysm/large AAA-not a surgical candidate. -Continue annual imaging  Emphysema/pulmonary scarring-likely from heavy smoking. -No respiratory symptoms  E. coli bacteriuria-patient denies UTI symptoms.  No leukocytosis but mild temp to 99.8 on 5/26.  -Completed antibiotic course with Keflex on 6/2.  HLD Resume Lipitor.  PVD -On Eliquis.  High risk for bleeding to add antiplatelets  Tobacco use disorder -Encourage cessation -Nicotine patch  Thrombocytopenia: Resolved Last Labs         Recent Labs  Lab 09/20/20 0404 09/21/20 0239 09/23/20 0229 09/26/20 0146  PLT 127* 145* 204 220      Goal of care-guarded prognosis especially with poor p.o. intake.  Patient and family not interested in tube feed.  Family expressed interest in [facility].  TOC placed referral.  She did not qualify for hospice home.    She is being discharged to SNF.    Discharge Diagnoses:  Principal Problem:   Failure to thrive in adult Active Problems:   Dyslipidemia   Tobacco abuse   Essential hypertension   PAD (peripheral artery disease) (HCC)   Cerebral embolism with cerebral infarction   Protein-calorie malnutrition, severe    Discharge Instructions  Discharge Instructions    Ambulatory referral to Neurology   Complete by: As directed    Follow up with stroke clinic NP (Jessica Vanschaick or Darrol Angel, if both not available, consider Manson Allan, or Ahern) at [facility] in about 4 weeks. Thanks.     Allergies as of 09/29/2020   No Known Allergies     Medication List    STOP taking these medications   ALPRAZolam 0.25 MG tablet Commonly  known as: XANAX   amLODipine 5 MG tablet Commonly known as: NORVASC   aspirin 81 MG tablet   cilostazol 50 MG tablet Commonly known as: PLETAL   escitalopram 5 MG tablet Commonly known as: Lexapro   ibuprofen 200 MG tablet Commonly known as: ADVIL     TAKE these medications   (feeding supplement) PROSource Plus liquid Take 30 mLs by mouth 2 (two) times daily between meals.   feeding supplement Liqd Take 237 mLs by mouth 3 (three) times daily between meals.   apixaban 5 MG Tabs tablet Commonly known as: ELIQUIS Take 1 tablet (5 mg total) by mouth 2 (two) times daily.   atorvastatin 20 MG tablet Commonly known as: LIPITOR Take 1 tablet by mouth daily.   busPIRone 5 MG tablet Commonly known as: BUSPAR Take 1 tablet (5 mg total) by mouth 2 (two) times daily.   HYDROcodone-acetaminophen 5-325 MG tablet Commonly known as: NORCO/VICODIN Take 1 tablet by mouth every 6 (six) hours as needed for moderate pain.   OLANZapine 2.5 MG tablet Commonly known as: ZYPREXA Take 1 tablet (2.5 mg total) by mouth at bedtime.   omeprazole 40 MG capsule Commonly known as: PRILOSEC Take 1 capsule (40 mg total) by mouth daily.   potassium chloride 10 MEQ tablet Commonly known as: KLOR-CON Take 2 tablets (20 mEq total)  by mouth daily. What changed:   how much to take  when to take this       Follow-up Information    [facility]. Schedule an appointment as soon as possible for a visit in 4 week(s).   Specialty: Neurology Contact information: [address] [place] [address] [phone]       Pincus Sanes, MD Follow up in 1 week(s).   Specialty: Internal Medicine Contact information: [address] [place] [address] [phone]        Kathleene Hazel, MD .   Specialty: Cardiology Contact information: [address] [place] [address] [phone]              No Known  Allergies  Consultations: Palliative care, neurology and psychiatry   Procedures/Studies: CT ANGIO HEAD NECK W WO CM  Result Date: 09/16/2020 CLINICAL DATA:  Stroke. EXAM: CT ANGIOGRAPHY HEAD AND NECK TECHNIQUE: Multidetector CT imaging of the head and neck was performed using the standard protocol during bolus administration of intravenous contrast. Multiplanar CT image reconstructions and MIPs were obtained to evaluate the vascular anatomy. Carotid stenosis measurements (when applicable) are obtained utilizing NASCET criteria, using the distal internal carotid diameter as the denominator. CONTRAST:  50mL OMNIPAQUE IOHEXOL 350 MG/ML SOLN COMPARISON:  Head CT and MRI 09/15/2020 FINDINGS: CT HEAD FINDINGS Brain: Numerous scattered small acute infarcts throughout the cerebrum and cerebellum bilaterally are better demonstrated on yesterday's MRI. No acute intracranial hemorrhage, mass, midline shift, or extra-axial fluid collection is identified. There is a background of moderately advanced chronic small vessel ischemia in the cerebral white matter. A chronic lacunar infarct is again noted in the right thalamus. Small chronic cerebellar infarcts shown on MRI are poorly demonstrated on CT due to motion and skull base artifact. There is moderate cerebral atrophy. Vascular: Calcified atherosclerosis at the skull base. Skull: No fracture within limitations of motion artifact. Left lateral skull osteoma. Sinuses: Persistent complete opacification of the left maxillary sinus and partial opacification of left anterior ethmoid air cells. No significant mastoid fluid. Orbits: Right cataract extraction. Review of the MIP images confirms the above findings CTA NECK FINDINGS Aortic arch: Incomplete imaging of the aortic arch including of the brachiocephalic and left common carotid origins. Diffuse calcified atherosclerotic plaque in the included portion of the arch and subclavian arteries. Characterization of a suspected  underlying proximal left subclavian stenosis is limited by motion. Right carotid system: Patent with a small to moderate amount of calcified plaque in the proximal common carotid artery not resulting in a flow limiting stenosis. Prominent calcified plaque at the carotid bifurcation results in less than 50% stenosis of the origin of the ICA. Widely patent and mildly ectatic cervical ICA more distally. Left carotid system: Patent with moderate calcified plaque throughout the proximal and mid common carotid artery with assessment of the proximal common carotid artery limited by motion. Extensive calcified plaque at the carotid bifurcation resulting in approximately 65% proximal ICA stenosis with assessment limited by the heavy calcification of the plaque which results in suboptimal visualization of the residual patent lumen. Widely patent and mildly ectatic cervical ICA more distally. Vertebral arteries: The vertebral arteries are patent and codominant. Motion artifact limits assessment for stenosis in the V1 and V2 segments. Skeleton: Advanced lower cervical disc degeneration. Other neck: Limited assessment of the soft tissues of the neck due to technique and motion. No gross mass or lymphadenopathy identified. Upper chest: Emphysema and scarring in  the lung apices. Review of the MIP images confirms the above findings CTA HEAD FINDINGS Anterior circulation: The internal carotid arteries are patent from skull base to carotid termini with moderate atherosclerotic plaque bilaterally not resulting in a significant stenosis within limitations of artifact. Both cavernous segments are tortuous. ACAs and MCAs are patent without evidence of a proximal branch occlusion. There is calcified plaque in the proximal right M1 segment without evidence of an associated significant stenosis. ACA and MCA branch vessels appear diffusely attenuated and irregular bilaterally, and branch vessel assessment is limited by motion. No aneurysm is  identified. Posterior circulation: The intracranial vertebral arteries are patent to the basilar with atherosclerotic irregularity resulting in mild multifocal narrowing on the left. There may be more significant narrowing of the distal right V4 segment although the vessel is suboptimally visualized due to artifact. The basilar artery is patent with mild narrowing proximally. There are right larger than left posterior communicating arteries with a fetal origin noted of the right PCA. Both PCAs are patent proximally although there is marked attenuation and irregularity of PCA branch vessels bilaterally. There is also moderate to severe stenosis of the proximal left P2 segment. A small calcification is noted at the left P2 bifurcation. No aneurysm is identified. Venous sinuses: Not well evaluated due to contrast timing. Anatomic variants: Fetal right PCA. Review of the MIP images confirms the above findings IMPRESSION: 1. Motion degraded study. 2. No emergent large vessel occlusion. 3. Widespread atherosclerosis with 65% stenosis of the proximal left ICA. 4. Aortic Atherosclerosis (ICD10-I70.0) and Emphysema (ICD10-J43.9). Electronically Signed   By: Sebastian Ache M.D.   On: 09/16/2020 20:08   DG Chest 2 View  Result Date: 09/15/2020 CLINICAL DATA:  Weakness and shortness of breath. EXAM: CHEST - 2 VIEW COMPARISON:  06/03/2019 FINDINGS: Again noted is kyphosis in the thoracic spine. Both lungs are clear. Heart size is stable. Evidence for hiatal hernia. In the interim, there has been bone cement placed in the L1 vertebral body. Difficult to exclude a new compression deformity at L2. Diffuse atherosclerotic calcifications in the thoracic aorta. IMPRESSION: 1. No acute cardiopulmonary disease. 2. Previous kyphoplasty at L1. Cannot exclude a new compression deformity at L2. Recommend dedicated lumbar spine images if this is an area of clinical concern. Electronically Signed   By: Richarda Overlie M.D.   On: 09/15/2020 11:55    CT Head Wo Contrast  Result Date: 09/15/2020 CLINICAL DATA:  Delirium EXAM: CT HEAD WITHOUT CONTRAST TECHNIQUE: Contiguous axial images were obtained from the base of the skull through the vertex without intravenous contrast. COMPARISON:  None. FINDINGS: Brain: No evidence of acute infarction, hemorrhage, hydrocephalus, extra-axial collection or mass lesion/mass effect. Diffuse cortical atrophy. Periventricular white matter hypodensity is a nonspecific finding, but most commonly relates to chronic ischemic small vessel disease. Vascular: No hyperdense vessel or unexpected calcification. Skull: Smoothly marginated protuberant mass extending laterally from the left side of the calvarium measuring 2.4 x 1.5 cm is likely an osteoma. Legs aggressive characteristics such as periosteal reaction or cortical breakthrough. Sinuses/Orbits: Partial opacification of left anterior ethmoid air cells. Complete opacification left maxillary sinus. Hyperdense content of the left maxillary sinus may be due to inspissated mucus, hemorrhage, or fungal infection. Other: None. IMPRESSION: No acute intracranial abnormality Electronically Signed   By: Acquanetta Belling M.D.   On: 09/15/2020 13:17   CT Chest Wo Contrast  Result Date: 09/15/2020 CLINICAL DATA:  Persistent cough.  Weakness and shortness of breath. EXAM: CT CHEST WITHOUT CONTRAST  TECHNIQUE: Multidetector CT imaging of the chest was performed following the standard protocol without IV contrast. COMPARISON:  Chest radiography same day.  CT angiography 06/03/2019. FINDINGS: Cardiovascular: Cardiomegaly. No pericardial fluid. Extensive coronary artery calcification and/or stents. Extensive aortic atherosclerotic calcification. Maximal diameter of the ascending aorta 4.4 cm, increased by 1 mm from the previous exam. Mediastinum/Nodes: No hilar or mediastinal mass or lymphadenopathy. Large hiatal hernia. Lungs/Pleura: Emphysema and pulmonary scarring as seen previously. Previous  6 mm density in the right upper lobe question previously looks more like a simple scar today. This is not a measurable nodule on today's exam. This is located in the region of image 46. Scarring at both lung apices. No evidence of focal nodule or mass in the right lung. However, in the lateral aspect of the left upper lobe axial image 62, the patient has developed a 1 cm peripheral mass/nodule where there was only a small area of scarring previously. This is viewed with considerable concern given the rate of growth. No pleural effusion on either side. Upper Abdomen: Hiatal hernia as noted previously. Partial visualization of the abdominal aortic aneurysm measuring up to 6.6 cm in the portion imaged. This was not fully or completely evaluated. Musculoskeletal: Chronic augmented fracture in the upper lumbar region. Old healed sternal fracture. No acute bone finding. IMPRESSION: Emphysema and pulmonary scarring. No sign of pneumonia, collapse or effusion. Newly seen 1 cm mass in the lateral left upper lobe, axial images 61 and 62. This was not present previously and is quite concerning for pulmonary malignancy. Consider one of the following in 3 months for both low-risk and high-risk individuals: (a) repeat chest CT, (b) follow-up PET-CT, or (c) tissue sampling. This recommendation follows the consensus statement: Guidelines for Management of Incidental Pulmonary Nodules Detected on CT Images: From the Fleischner Society 2017; Radiology 2017; 284:228-243. Previously seen density in the right upper lobe is no longer measurable and looks like a simple scar. Cardiomegaly, coronary artery calcification it advanced atherosclerosis of the aorta. Maximal diameter of the ascending aorta increased from 4.3 cm to 4.4 cm. Recommend annual imaging followup by CTA or MRA. This recommendation follows 2010 ACCF/AHA/AATS/ACR/ASA/SCA/SCAI/SIR/STS/SVM Guidelines for the Diagnosis and Management of Patients with Thoracic Aortic Disease.  Circulation. 2010; 121: I458-K998. Aortic aneurysm NOS (ICD10-I71.9) Partial visualization of a large abdominal aortic aneurysm as seen previously, not completely evaluated today. Only the upper portion is visualized. Large hiatal hernia. Aortic Atherosclerosis (ICD10-I70.0) and Emphysema (ICD10-J43.9). Electronically Signed   By: Paulina Fusi M.D.   On: 09/15/2020 14:35   MR BRAIN WO CONTRAST  Result Date: 09/15/2020 CLINICAL DATA:  Initial evaluation for neuro deficit, stroke suspected. EXAM: MRI HEAD WITHOUT CONTRAST TECHNIQUE: Multiplanar, multiecho pulse sequences of the brain and surrounding structures were obtained without intravenous contrast. COMPARISON:  Prior CT from earlier the same day. FINDINGS: Brain: Diffuse prominence of the CSF containing spaces compatible with generalized cerebral atrophy, moderately advanced in nature. Patchy and confluent T2/FLAIR hyperintensity involving the periventricular and deep white matter both cerebral hemispheres as well as the pons, most consistent with chronic small vessel ischemic disease, also moderately advanced. Few scattered superimposed remote lacunar infarcts present about the thalami and pons. Multiple scattered predominantly subcentimeter foci of restricted diffusion seen involving the cortical and subcortical aspects of both cerebral and cerebellar hemispheres, consistent with multifocal ischemic infarcts. Scattered patchy involvement of the deep gray nuclei and brainstem as well. For reference purposes, the most prominent discrete focus seen involving the right periatrial white matter and  measures 7 mm. No associated hemorrhage or significant mass effect. Multiple scattered underlying chronic micro hemorrhages noted, predominantly clustered about the cerebellum, brainstem, and thalami, likely related to poorly controlled hypertension. No mass lesion, midline shift or mass effect. No hydrocephalus or extra-axial fluid collection. Pituitary gland  suprasellar region within normal limits. Midline structures intact. Vascular: Major intracranial vascular flow voids are maintained. Skull and upper cervical spine: Craniocervical junction within normal limits. Bone marrow signal intensity normal. No scalp soft tissue abnormality. Sinuses/Orbits: Patient status post ocular lens replacement on the right. Globes and orbital soft tissues demonstrate no acute finding. Chronic left ethmoidal and maxillary sinusitis noted. Trace bilateral mastoid effusions, of doubtful significance. Inner ear structures grossly normal. Other: None. IMPRESSION: 1. Multiple scattered predominantly subcentimeter foci of restricted diffusion involving the cortical and subcortical aspects of both cerebral and cerebellar hemispheres, consistent with multifocal ischemic infarcts. No associated hemorrhage or significant mass effect. 2. Underlying moderately advanced cerebral atrophy with chronic microvascular ischemic disease, with multiple additional remote lacunar infarcts about the bilateral thalami and pons. 3. Multiple chronic micro hemorrhages clustered about the cerebellum, brainstem, and thalami, suggesting chronic poorly controlled hypertension. Electronically Signed   By: Rise Mu M.D.   On: 09/15/2020 19:58   MR BRAIN W CONTRAST  Result Date: 09/19/2020 CLINICAL DATA:  Brain mass or lesion. EXAM: MRI HEAD WITH CONTRAST TECHNIQUE: Multiplanar, multiecho pulse sequences of the brain and surrounding structures were obtained with intravenous contrast. CONTRAST:  68mL GADAVIST GADOBUTROL 1 MMOL/ML IV SOLN COMPARISON:  MRI Sep 15, 2020. FINDINGS: Postcontrast imaging performed due to left upper lobe mass seen on recent CT chest. There is punctate enhancement associated with many of the areas of restricted diffusion on recent MRI. For example, there are enhancing lesions in the high bilateral frontal lobes in the precentral gyrus (series 44, image 5). Additional area of  enhancing left cerebellum (series 5, image 15), right cerebellum (series 5, image 10), and right anterior basal ganglia (series 5, image 31). Please see recent MRI for additional findings. IMPRESSION: Postcontrast imaging performed due to left upper lobe mass seen on recent CT chest. There is punctate multifocal enhancement at many of the sites of restricted diffusion seen on recent MRI head. These areas of enhancement could relate to enhancement of acute/subacute infarcts or metastases. A follow-up MRI with contrast in approximately 4 weeks is recommended to further evaluate. Electronically Signed   By: Feliberto Harts MD   On: 09/19/2020 14:06   ECHOCARDIOGRAM COMPLETE  Result Date: 09/16/2020    ECHOCARDIOGRAM REPORT   Patient Name:   YAMINA LENIS Date of Exam: 09/16/2020 Medical Rec #:  [id number]        Height:       64.0 in Accession #:    [id number]       Weight:       82.9 lb Date of Birth:  04-Mar-1941        BSA:          1.345 m Patient Age:    80 years         BP:           151/78 mmHg Patient Gender: F                HR:           72 bpm. Exam Location:  Inpatient Procedure: 2D Echo, Cardiac Doppler and Color Doppler Indications:    Stroke  History:  Patient has prior history of Echocardiogram examinations, most                 recent 01/03/2017. Signs/Symptoms:Altered Mental Status.  Sonographer:    Roosvelt Maser RDCS Referring Phys: [id number] Almon Hercules  Sonographer Comments: Technically difficult due to no cooperation. Patient wanted to remain curled up in a ball and be left alone. Difficult due to patient is 82 lbs. Difficult windows. IMPRESSIONS  1. Very difficult study due to poor patient cooperation due to altered mental status. Globally LV function appears normal, but not all walls were not assessed. Very limited evaluation of cardiac valves as the patient could not cooperate with the exam.  2. Left ventricular ejection fraction, by estimation, is 60 to 65%. The left ventricle has  normal function. Left ventricular endocardial border not optimally defined to evaluate regional wall motion. There is moderate asymmetric left ventricular hypertrophy of the basal-septal segment. Left ventricular diastolic function could not be evaluated.  3. Right ventricular systolic function is normal. The right ventricular size is normal.  4. The mitral valve is grossly normal. No evidence of mitral valve regurgitation. No evidence of mitral stenosis.  5. The aortic valve is calcified. Aortic valve regurgitation is mild. Mild to moderate aortic valve sclerosis/calcification is present, without any evidence of aortic stenosis. FINDINGS  Left Ventricle: Left ventricular ejection fraction, by estimation, is 60 to 65%. The left ventricle has normal function. Left ventricular endocardial border not optimally defined to evaluate regional wall motion. The left ventricular internal cavity size was normal in size. There is moderate asymmetric left ventricular hypertrophy of the basal-septal segment. Left ventricular diastolic function could not be evaluated due to nondiagnostic images. Left ventricular diastolic function could not be evaluated. Right Ventricle: The right ventricular size is normal. No increase in right ventricular wall thickness. Right ventricular systolic function is normal. Left Atrium: Left atrial size was normal in size. Right Atrium: Right atrial size was normal in size. Pericardium: Trivial pericardial effusion is present. Mitral Valve: The mitral valve is grossly normal. Mild mitral annular calcification. No evidence of mitral valve regurgitation. No evidence of mitral valve stenosis. Tricuspid Valve: The tricuspid valve is not well visualized. Aortic Valve: The aortic valve is calcified. Aortic valve regurgitation is mild. Mild to moderate aortic valve sclerosis/calcification is present, without any evidence of aortic stenosis. Pulmonic Valve: The pulmonic valve was not well visualized. Aorta:  The aortic root and ascending aorta are structurally normal, with no evidence of dilitation. IAS/Shunts: The atrial septum is grossly normal.  LEFT VENTRICLE PLAX 2D LVIDd:         3.60 cm LVIDs:         2.60 cm LV PW:         1.00 cm LV IVS:        1.40 cm LVOT diam:     1.90 cm LV SV:         51 LV SV Index:   38 LVOT Area:     2.84 cm  LEFT ATRIUM           Index LA diam:      3.50 cm 2.60 cm/m LA Vol (A4C): 27.1 ml 20.14 ml/m  AORTIC VALVE LVOT Vmax:   106.00 cm/s LVOT Vmean:  67.900 cm/s LVOT VTI:    0.180 m  AORTA Ao Root diam: 2.90 cm  SHUNTS Systemic VTI:  0.18 m Systemic Diam: 1.90 cm Lennie Odor MD Electronically signed by Lennie Odor MD Signature Date/Time:  09/16/2020/4:38:43 PM    Final    VAS US CAROTID  Result Date: 09/17/2020 Carotid Arterial Duplex Study Patient Name:  Vito BackersSUSAN L Wedin  Date of Exam:   09/17/2020 Medical Rec #: [id number]         Accession #:    [id number] Date of Birth: 04-27-1940         Patient Gender: F Patient Age:   80Y [facility] Procedure:      VAS US CAROTID Referring Phys: [id number] MCNEILL P KIRKPATRICK --------------------------------------------------------------------------------  Indications:       CVA. Risk Factors:      Hypertension, hyperlipidemia, current smoker, PAD. Other Factors:     Prediabetic. Comparison Study:  12-17-2019 Prior carotid duplex bilateral showed 1-39%                    stenosis of the RT ICA and 40-59% stenosis of the LT ICA. Performing Technologist: Jean Rosenthalachel Hodge RDMS,RVT  Examination Guidelines: A complete evaluation includes B-mode imaging, spectral Doppler, color Doppler, and power Doppler as needed of all accessible portions of each vessel. Bilateral testing is considered an integral part of a complete examination. Limited examinations for reoccurring indications may be performed as noted.  Right Carotid Findings: +----------+--------+--------+--------+-------------------------+--------+           PSV cm/sEDV  cm/sStenosisPlaque Description       Comments +----------+--------+--------+--------+-------------------------+--------+ CCA Prox  84      12              calcific                          +----------+--------+--------+--------+-------------------------+--------+ CCA Mid   90      15              calcific                          +----------+--------+--------+--------+-------------------------+--------+ CCA Distal57      12              heterogenous and calcific         +----------+--------+--------+--------+-------------------------+--------+ ICA Prox  60      17      1-39%   heterogenous and calcific         +----------+--------+--------+--------+-------------------------+--------+ ICA Distal47      16                                       tortuous +----------+--------+--------+--------+-------------------------+--------+ ECA       70                                                        +----------+--------+--------+--------+-------------------------+--------+ +----------+--------+-------+----------------+-------------------+           PSV cm/sEDV cmsDescribe        Arm Pressure (mmHG) +----------+--------+-------+----------------+-------------------+ [id number]            Multiphasic, WNL                    +----------+--------+-------+----------------+-------------------+ +---------+--------+--+--------+-+---------+ VertebralPSV cm/s48EDV cm/s6Antegrade +---------+--------+--+--------+-+---------+  Left Carotid Findings: +----------+--------+--------+--------+-------------------------+--------+           PSV cm/sEDV cm/sStenosisPlaque Description  Comments +----------+--------+--------+--------+-------------------------+--------+ CCA Prox  63      12              calcific and heterogenous         +----------+--------+--------+--------+-------------------------+--------+ CCA Mid   49      10              calcific                           +----------+--------+--------+--------+-------------------------+--------+ ICA Prox  257     52      40-59%  calcific and heterogenous         +----------+--------+--------+--------+-------------------------+--------+ ICA Mid   68      17                                                +----------+--------+--------+--------+-------------------------+--------+ ICA Distal51      15                                       tortuous +----------+--------+--------+--------+-------------------------+--------+ ECA       49                                                        +----------+--------+--------+--------+-------------------------+--------+ +----------+--------+--------+----------------+-------------------+           PSV cm/sEDV cm/sDescribe        Arm Pressure (mmHG) +----------+--------+--------+----------------+-------------------+ [id number]             Multiphasic, WNL                    +----------+--------+--------+----------------+-------------------+ +---------+--------+--+--------+--+---------+ VertebralPSV cm/s46EDV cm/s12Antegrade +---------+--------+--+--------+--+---------+   Summary: Right Carotid: Velocities in the right ICA are consistent with a 1-39% stenosis.                Non-hemodynamically significant plaque <50% noted in the CCA. Left Carotid: Velocities in the left ICA are consistent with a 40-59% stenosis.               Non-hemodynamically significant plaque <50% noted in the CCA. Vertebrals:  Bilateral vertebral arteries demonstrate antegrade flow. Subclavians: Normal flow hemodynamics were seen in bilateral subclavian              arteries. *See table(s) above for measurements and observations.  Electronically signed by Sherald Hess MD on 09/17/2020 at 4:31:07 PM.    Final    VAS [place] LOWER EXTREMITY VENOUS (DVT)  Result Date: 09/19/2020  Lower Venous DVT Study Patient Name:  JOSELYN EDLING  Date of Exam:    09/19/2020 Medical Rec #: [id number]         Accession #:    [id number] Date of Birth: 21-Mar-1941         Patient Gender: F Patient Age:   80Y Exam Location:  [facility] Procedure:      VAS [place] LOWER EXTREMITY VENOUS (DVT) Referring Phys: [id number] Marvel Plan --------------------------------------------------------------------------------  Indications: Stroke.  Limitations: Calcific shadowing. Comparison Study: No previous exams Performing Technologist: Ernestene Mention  Examination Guidelines: A complete evaluation includes B-mode imaging,  spectral Doppler, color Doppler, and power Doppler as needed of all accessible portions of each vessel. Bilateral testing is considered an integral part of a complete examination. Limited examinations for reoccurring indications may be performed as noted. The reflux portion of the exam is performed with the patient in reverse Trendelenburg.  +---------+---------------+---------+-----------+----------+--------------+ RIGHT    CompressibilityPhasicitySpontaneityPropertiesThrombus Aging +---------+---------------+---------+-----------+----------+--------------+ CFV      Full           Yes      Yes                                 +---------+---------------+---------+-----------+----------+--------------+ SFJ      Full                                                        +---------+---------------+---------+-----------+----------+--------------+ FV Prox  Full           Yes      Yes                                 +---------+---------------+---------+-----------+----------+--------------+ FV Mid   Full           Yes      Yes                                 +---------+---------------+---------+-----------+----------+--------------+ FV DistalFull           Yes      Yes                                 +---------+---------------+---------+-----------+----------+--------------+ PFV      Full                                                         +---------+---------------+---------+-----------+----------+--------------+ POP      Full           Yes      Yes                                 +---------+---------------+---------+-----------+----------+--------------+ PTV      Full                                                        +---------+---------------+---------+-----------+----------+--------------+ PERO     Full                                                        +---------+---------------+---------+-----------+----------+--------------+   +---------+---------------+---------+-----------+----------+--------------+ LEFT     CompressibilityPhasicitySpontaneityPropertiesThrombus Aging +---------+---------------+---------+-----------+----------+--------------+ CFV  Full           Yes      Yes                                 +---------+---------------+---------+-----------+----------+--------------+ SFJ      Full                                                        +---------+---------------+---------+-----------+----------+--------------+ FV Prox  Full           Yes      Yes                                 +---------+---------------+---------+-----------+----------+--------------+ FV Mid   Full           Yes      Yes                                 +---------+---------------+---------+-----------+----------+--------------+ FV DistalFull           Yes      Yes                                 +---------+---------------+---------+-----------+----------+--------------+ PFV      Full                                                        +---------+---------------+---------+-----------+----------+--------------+ POP      Full           Yes      Yes                                 +---------+---------------+---------+-----------+----------+--------------+ PTV      Full                                                         +---------+---------------+---------+-----------+----------+--------------+ PERO     None                                         One of paired  +---------+---------------+---------+-----------+----------+--------------+ Gastroc  None                                         One of paired  +---------+---------------+---------+-----------+----------+--------------+     Summary: BILATERAL: -No evidence of popliteal cyst, bilaterally. RIGHT: - There is no evidence of deep vein thrombosis in the lower extremity. - There is no evidence of superficial venous thrombosis.  LEFT: - Findings consistent with acute  deep vein thrombosis involving the left peroneal veins, and left gastrocnemius veins. - There is no evidence of superficial venous thrombosis.  *See table(s) above for measurements and observations. Electronically signed by Sherald Hess MD on 09/19/2020 at 10:20:10 AM.    Final      Discharge Exam: Vitals:   09/29/20 0343 09/29/20 0821  BP: (!) 156/82 133/75  Pulse: 83 93  Resp: 16 16  Temp: 98.9 F (37.2 C) 98.3 F (36.8 C)  SpO2: 96% 100%   Vitals:   09/29/20 0002 09/29/20 0343 09/29/20 0344 09/29/20 0821  BP: (!) 155/67 (!) 156/82  133/75  Pulse: 67 83  93  Resp: 18 16  16   Temp: (!) 97.5 F (36.4 C) 98.9 F (37.2 C)  98.3 F (36.8 C)  TempSrc: Oral   Oral  SpO2: 100% 96%  100%  Weight:   39.5 kg     General: Pt is alert, awake, not in acute distress Cardiovascular: RRR, S1/S2 +, no rubs, no gallops Respiratory: CTA bilaterally, no wheezing, no rhonchi Abdominal: Soft, NT, ND, bowel sounds + Extremities: no edema, no cyanosis Neuro: No clear-cut focal deficit but she has left-sided neglect for upper and lower extremity.   The results of significant diagnostics from this hospitalization (including imaging, microbiology, ancillary and laboratory) are listed below for reference.     Microbiology: Recent Results (from the past 240 hour(s))  Resp Panel by  RT-PCR (Flu A&B, Covid) Nasopharyngeal Swab     Status: None   Collection Time: 09/25/20 10:00 PM   Specimen: Nasopharyngeal Swab; Nasopharyngeal(NP) swabs in vial transport medium  Result Value Ref Range Status   SARS Coronavirus 2 by RT PCR NEGATIVE NEGATIVE Final    Comment: (NOTE) SARS-CoV-2 target nucleic acids are NOT DETECTED.  The SARS-CoV-2 RNA is generally detectable in upper respiratory specimens during the acute phase of infection. The lowest concentration of SARS-CoV-2 viral copies this assay can detect is 138 copies/mL. A negative result does not preclude SARS-Cov-2 infection and should not be used as the sole basis for treatment or other patient management decisions. A negative result may occur with  improper specimen collection/handling, submission of specimen other than nasopharyngeal swab, presence of viral mutation(s) within the areas targeted by this assay, and inadequate number of viral copies(<138 copies/mL). A negative result must be combined with clinical observations, patient history, and epidemiological information. The expected result is Negative.  Fact Sheet for Patients:  BloggerCourse.com  Fact Sheet for Healthcare Providers:  SeriousBroker.it  This test is no t yet approved or cleared by the [place] FDA and  has been authorized for detection and/or diagnosis of SARS-CoV-2 by FDA under an Emergency Use Authorization (EUA). This EUA will remain  in effect (meaning this test can be used) for the duration of the COVID-19 declaration under Section 564(b)(1) of the Act, 21 U.S.C.section 360bbb-3(b)(1), unless the authorization is terminated  or revoked sooner.       Influenza A by PCR NEGATIVE NEGATIVE Final   Influenza B by PCR NEGATIVE NEGATIVE Final    Comment: (NOTE) The Xpert Xpress SARS-CoV-2/FLU/RSV plus assay is intended as an aid in the diagnosis of influenza from Nasopharyngeal swab  specimens and should not be used as a sole basis for treatment. Nasal washings and aspirates are unacceptable for Xpert Xpress SARS-CoV-2/FLU/RSV testing.  Fact Sheet for Patients: BloggerCourse.com  Fact Sheet for Healthcare Providers: SeriousBroker.it  This test is not yet approved or cleared by the [place] FDA and has been  authorized for detection and/or diagnosis of SARS-CoV-2 by FDA under an Emergency Use Authorization (EUA). This EUA will remain in effect (meaning this test can be used) for the duration of the COVID-19 declaration under Section 564(b)(1) of the Act, 21 U.S.C. section 360bbb-3(b)(1), unless the authorization is terminated or revoked.  Performed at [facility], [address] [place], [place] [address]   SARS CORONAVIRUS 2 (TAT 6-24 HRS) Nasopharyngeal Nasopharyngeal Swab     Status: None   Collection Time: 09/28/20  3:05 PM   Specimen: Nasopharyngeal Swab  Result Value Ref Range Status   SARS Coronavirus 2 NEGATIVE NEGATIVE Final    Comment: (NOTE) SARS-CoV-2 target nucleic acids are NOT DETECTED.  The SARS-CoV-2 RNA is generally detectable in upper and lower respiratory specimens during the acute phase of infection. Negative results do not preclude SARS-CoV-2 infection, do not rule out co-infections with other pathogens, and should not be used as the sole basis for treatment or other patient management decisions. Negative results must be combined with clinical observations, patient history, and epidemiological information. The expected result is Negative.  Fact Sheet for Patients: HairSlick.no  Fact Sheet for Healthcare Providers: quierodirigir.com  This test is not yet approved or cleared by the [place] FDA and  has been authorized for detection and/or diagnosis of SARS-CoV-2 by FDA under an Emergency Use Authorization (EUA).  This EUA will remain  in effect (meaning this test can be used) for the duration of the COVID-19 declaration under Se ction 564(b)(1) of the Act, 21 U.S.C. section 360bbb-3(b)(1), unless the authorization is terminated or revoked sooner.  Performed at [facility], [address], [place], [place] [address]      Labs: BNP (last 3 results) No results for input(s): BNP in the last 8760 hours. Basic Metabolic Panel: Recent Labs  Lab 09/23/20 0229 09/24/20 0113 09/24/20 1547 09/25/20 0816 09/26/20 0146 09/29/20 0758  NA 144 148* 144 139 136 145  K 4.1 4.6 3.8 3.9 4.0 3.5  CL 104 110 104 102 99 106  CO2 30 31 29 27 27 28   GLUCOSE 114* 118* 120* 114* 102* 103*  BUN 45* 54* 52* 51* 47* 46*  CREATININE 1.16* 1.02* 1.19* 1.18* 1.08* 1.21*  CALCIUM 9.5 9.7 9.5 9.0 8.9 9.3  MG 2.5* 2.6*  --  2.3 2.4  --   PHOS 4.8* 5.2* 5.0* 4.4 4.4  --    Liver Function Tests: Recent Labs  Lab 09/23/20 0229 09/24/20 0113 09/24/20 1547 09/25/20 0816 09/26/20 0146  ALBUMIN 2.8* 2.9* 2.9* 2.7* 2.8*   No results for input(s): LIPASE, AMYLASE in the last 168 hours. No results for input(s): AMMONIA in the last 168 hours. CBC: Recent Labs  Lab 09/23/20 0229 09/24/20 0113 09/26/20 0146 09/27/20 0952 09/29/20 0758  WBC 9.5  --  14.3* 14.2* 11.5*  NEUTROABS  --   --   --  10.5* 7.9*  HGB 8.9* 9.1* 9.1* 8.7* 9.3*  HCT 28.4* 29.9* 29.0* 27.8* 29.6*  MCV 81.8  --  82.4 83.2 83.9  PLT 204  --  220 204 190   Cardiac Enzymes: No results for input(s): CKTOTAL, CKMB, CKMBINDEX, TROPONINI in the last 168 hours. BNP: Invalid input(s): POCBNP CBG: No results for input(s): GLUCAP in the last 168 hours. D-Dimer No results for input(s): DDIMER in the last 72 hours. Hgb A1c No results for input(s): HGBA1C in the last 72 hours. Lipid Profile No results for input(s): CHOL, HDL, LDLCALC, TRIG, CHOLHDL, LDLDIRECT in  the last 72 hours. Thyroid function studies No results for input(s): TSH,  T4TOTAL, T3FREE, THYROIDAB in the last 72 hours.  Invalid input(s): FREET3 Anemia work up No results for input(s): VITAMINB12, FOLATE, FERRITIN, TIBC, IRON, RETICCTPCT in the last 72 hours. Urinalysis    Component Value Date/Time   COLORURINE YELLOW 09/16/2020 1522   APPEARANCEUR HAZY (A) 09/16/2020 1522   LABSPEC 1.018 09/16/2020 1522   PHURINE 7.0 09/16/2020 1522   GLUCOSEU NEGATIVE 09/16/2020 1522   GLUCOSEU NEGATIVE 04/21/2015 1135   HGBUR LARGE (A) 09/16/2020 1522   BILIRUBINUR NEGATIVE 09/16/2020 1522   KETONESUR NEGATIVE 09/16/2020 1522   PROTEINUR NEGATIVE 09/16/2020 1522   UROBILINOGEN 0.2 04/21/2015 1135   NITRITE NEGATIVE 09/16/2020 1522   LEUKOCYTESUR TRACE (A) 09/16/2020 1522   Sepsis Labs Invalid input(s): PROCALCITONIN,  WBC,  LACTICIDVEN Microbiology Recent Results (from the past 240 hour(s))  Resp Panel by RT-PCR (Flu A&B, Covid) Nasopharyngeal Swab     Status: None   Collection Time: 09/25/20 10:00 PM   Specimen: Nasopharyngeal Swab; Nasopharyngeal(NP) swabs in vial transport medium  Result Value Ref Range Status   SARS Coronavirus 2 by RT PCR NEGATIVE NEGATIVE Final    Comment: (NOTE) SARS-CoV-2 target nucleic acids are NOT DETECTED.  The SARS-CoV-2 RNA is generally detectable in upper respiratory specimens during the acute phase of infection. The lowest concentration of SARS-CoV-2 viral copies this assay can detect is 138 copies/mL. A negative result does not preclude SARS-Cov-2 infection and should not be used as the sole basis for treatment or other patient management decisions. A negative result may occur with  improper specimen collection/handling, submission of specimen other than nasopharyngeal swab, presence of viral mutation(s) within the areas targeted by this assay, and inadequate number of viral copies(<138 copies/mL). A negative result must be combined with clinical observations, patient history, and epidemiological information. The  expected result is Negative.  Fact Sheet for Patients:  BloggerCourse.com  Fact Sheet for Healthcare Providers:  SeriousBroker.it  This test is no t yet approved or cleared by the [place] FDA and  has been authorized for detection and/or diagnosis of SARS-CoV-2 by FDA under an Emergency Use Authorization (EUA). This EUA will remain  in effect (meaning this test can be used) for the duration of the COVID-19 declaration under Section 564(b)(1) of the Act, 21 U.S.C.section 360bbb-3(b)(1), unless the authorization is terminated  or revoked sooner.       Influenza A by PCR NEGATIVE NEGATIVE Final   Influenza B by PCR NEGATIVE NEGATIVE Final    Comment: (NOTE) The Xpert Xpress SARS-CoV-2/FLU/RSV plus assay is intended as an aid in the diagnosis of influenza from Nasopharyngeal swab specimens and should not be used as a sole basis for treatment. Nasal washings and aspirates are unacceptable for Xpert Xpress SARS-CoV-2/FLU/RSV testing.  Fact Sheet for Patients: BloggerCourse.com  Fact Sheet for Healthcare Providers: SeriousBroker.it  This test is not yet approved or cleared by the [place] FDA and has been authorized for detection and/or diagnosis of SARS-CoV-2 by FDA under an Emergency Use Authorization (EUA). This EUA will remain in effect (meaning this test can be used) for the duration of the COVID-19 declaration under Section 564(b)(1) of the Act, 21 U.S.C. section 360bbb-3(b)(1), unless the authorization is terminated or revoked.  Performed at [facility], [address] [place], [place] [address]   SARS CORONAVIRUS 2 (TAT 6-24 HRS) Nasopharyngeal Nasopharyngeal Swab     Status: None   Collection Time: 09/28/20  3:05 PM  Specimen: Nasopharyngeal Swab  Result Value Ref Range Status   SARS Coronavirus 2 NEGATIVE NEGATIVE Final    Comment:  (NOTE) SARS-CoV-2 target nucleic acids are NOT DETECTED.  The SARS-CoV-2 RNA is generally detectable in upper and lower respiratory specimens during the acute phase of infection. Negative results do not preclude SARS-CoV-2 infection, do not rule out co-infections with other pathogens, and should not be used as the sole basis for treatment or other patient management decisions. Negative results must be combined with clinical observations, patient history, and epidemiological information. The expected result is Negative.  Fact Sheet for Patients: HairSlick.no  Fact Sheet for Healthcare Providers: quierodirigir.com  This test is not yet approved or cleared by the [place] FDA and  has been authorized for detection and/or diagnosis of SARS-CoV-2 by FDA under an Emergency Use Authorization (EUA). This EUA will remain  in effect (meaning this test can be used) for the duration of the COVID-19 declaration under Se ction 564(b)(1) of the Act, 21 U.S.C. section 360bbb-3(b)(1), unless the authorization is terminated or revoked sooner.  Performed at [facility], [address], [place], [place] [address]      Time coordinating discharge: Over 30 minutes  SIGNED:   Hughie Closs, MD  [facility] 09/29/2020, 10:17 AM  If 7PM-7AM, please contact night-coverage [email or web address]

## 2020-09-29 NOTE — Care Management Important Message (Signed)
Important Message  Patient Details  Name: KARRINE KLUTTZ MRN: [id number] Date of Birth: 01-Mar-1941   Medicare Important Message Given:  Yes     Pellegrino Kennard 09/29/2020, 10:15 AM

## 2020-09-29 NOTE — Progress Notes (Signed)
Pt medically stable for discharge per MD order. IV removed. Belongings: greeting cards. PTAR to transport to [facility] [employer].

## 2020-09-29 NOTE — Plan of Care (Signed)
  Problem: Acute Rehab PT Goals(only PT should resolve) Goal: Pt Will Go Supine/Side To Sit Outcome: Adequate for Discharge Goal: Pt Will Go Sit To Supine/Side Outcome: Adequate for Discharge Goal: Patient Will Transfer Sit To/From Stand Outcome: Adequate for Discharge Goal: Pt Will Ambulate Outcome: Adequate for Discharge   Problem: Education: Goal: Knowledge of General Education information will improve Description: Including pain rating scale, medication(s)/side effects and non-pharmacologic comfort measures Outcome: Adequate for Discharge   Problem: Health Behavior/Discharge Planning: Goal: Ability to manage health-related needs will improve Outcome: Adequate for Discharge   Problem: Clinical Measurements: Goal: Ability to maintain clinical measurements within normal limits will improve Outcome: Adequate for Discharge Goal: Will remain free from infection Outcome: Adequate for Discharge Goal: Diagnostic test results will improve Outcome: Adequate for Discharge Goal: Respiratory complications will improve Outcome: Adequate for Discharge Goal: Cardiovascular complication will be avoided Outcome: Adequate for Discharge   Problem: Activity: Goal: Risk for activity intolerance will decrease Outcome: Adequate for Discharge   Problem: Nutrition: Goal: Adequate nutrition will be maintained Outcome: Adequate for Discharge   Problem: Coping: Goal: Level of anxiety will decrease Outcome: Adequate for Discharge   Problem: Elimination: Goal: Will not experience complications related to bowel motility Outcome: Adequate for Discharge Goal: Will not experience complications related to urinary retention Outcome: Adequate for Discharge   Problem: Pain Managment: Goal: General experience of comfort will improve Outcome: Adequate for Discharge   Problem: Safety: Goal: Ability to remain free from injury will improve Outcome: Adequate for Discharge   Problem: Skin  Integrity: Goal: Risk for impaired skin integrity will decrease Outcome: Adequate for Discharge   Problem: Acute Rehab OT Goals (only OT should resolve) Goal: Pt. Will Perform Eating Outcome: Adequate for Discharge Goal: Pt. Will Perform Grooming Outcome: Adequate for Discharge Goal: Pt. Will Perform Upper Body Bathing Outcome: Adequate for Discharge Goal: Pt. Will Perform Upper Body Dressing Outcome: Adequate for Discharge Goal: Pt. Will Transfer To Toilet Outcome: Adequate for Discharge Goal: Pt. Will Perform Toileting-Clothing Manipulation Outcome: Adequate for Discharge   Problem: Education: Goal: Knowledge of disease or condition will improve Outcome: Adequate for Discharge Goal: Knowledge of secondary prevention will improve Outcome: Adequate for Discharge Goal: Knowledge of patient specific risk factors addressed and post discharge goals established will improve Outcome: Adequate for Discharge Goal: Individualized Educational Video(s) Outcome: Adequate for Discharge   Problem: Coping: Goal: Will verbalize positive feelings about self Outcome: Adequate for Discharge Goal: Will identify appropriate support needs Outcome: Adequate for Discharge   Problem: Health Behavior/Discharge Planning: Goal: Ability to manage health-related needs will improve Outcome: Adequate for Discharge   Problem: Ischemic Stroke/TIA Tissue Perfusion: Goal: Complications of ischemic stroke/TIA will be minimized Outcome: Adequate for Discharge   Problem: Malnutrition  (NI-5.2) Goal: Food and/or nutrient delivery Description: Individualized approach for food/nutrient provision. Outcome: Adequate for Discharge   Problem: SLP Cognition Goals Goal: Patient will demonstrate attention to functional Description: Patient will demonstrate attention to functional task with Outcome: Adequate for Discharge

## 2020-09-29 NOTE — Progress Notes (Addendum)
Occupational Therapy Treatment Patient Details Name: Jennifer [name] MRN: [id number] DOB: [birth year] Today's Date: 09/29/2020    History of present illness Pt is a 80 y.o. F who presents with weakess, frequent falls, and confusion on 09/15/20. CT head negative for acute abnormality. MRIMRI brain showed multifocal infarcts including cortical and subcortical areas of both cerebral and cerebellar hemispheres.Patient has a CT chest that showed new 1 cm lateral LUL nodule, ascending aortic aneurysm measuring 4.4 cm. MRI brain with contrast with punctate multifocal enhancements which could be related to infarct or metastasis.  LLE [place] positive for left distal DVT. Significant PMH: HTN, osteoporosis, L1 compression fx s/p kyphoplasty.   OT comments  Patient seen this date for OT treat.  Patient with significant change in status compared to prior treat on 6/2.  Patient not attending to L side of her body, unable to move L arm even when prompted.  Patient appears to have R gaze preference, and not able to make eye contact with this OT.  Patient was Max A for bed mobility, and unable to maintain sitting balance without Max A.  Basic transfer to recliner was max A as patient unable to weight shift, or advance legs.  Patient is very confused and only oriented to self.  Patient was largely Min A for transfers and grooming task sitting sink side 7 days prior and oriented times 3 and following commands.  RN notified of changes to patient status, RN discussed with charge nurse, and charge nurse stating they are aware of patient changes.  She is scheduled for discharge to SNF today.  OT will downgrade patient goals and continue to follow in the acute setting if she remains.    Follow Up Recommendations  SNF;Supervision/Assistance - 24 hour    Equipment Recommendations       Recommendations for Other Services      Precautions / Restrictions Precautions Precautions: Fall Precaution Comments: patient not attending to  her L side. Restrictions Weight Bearing Restrictions: No Other Position/Activity Restrictions: chronic back pain       Mobility Bed Mobility Overal bed mobility: Needs Assistance Bed Mobility: Supine to Sit;Sit to Supine     Supine to sit: Max assist Sit to supine: Max assist     Patient Response: Anxious;Restless  Transfers Overall transfer level: Needs assistance   Transfers: Sit to/from Stand;Stand Pivot Transfers Sit to Stand: +2 safety/equipment;Max assist Stand pivot transfers: +2 safety/equipment;Max assist       General transfer comment: patient not initiating activity, not demonstrating ability to weight shift or move feet underneath her.    Balance Overall balance assessment: Needs assistance Sitting-balance support: Feet supported;Bilateral upper extremity supported Sitting balance-Leahy Scale: Poor   Postural control: Posterior lean;Left lateral lean Standing balance support: Bilateral upper extremity supported Standing balance-Leahy Scale: Zero                             ADL either performed or assessed with clinical judgement   ADL       Grooming: Moderate assistance;Sitting Grooming Details (indicate cue type and reason): using R hand only, not incorporating L UE at all wven when cued                     Toileting- Clothing Manipulation and Hygiene: Total assistance;Bed level               Vision       Perception  Praxis      Cognition Arousal/Alertness: Awake/alert Behavior During Therapy: Restless;Anxious Overall Cognitive Status: Impaired/Different from baseline                   Orientation Level: Disoriented to;Time;Situation;Place Current Attention Level: Focused Memory: Decreased short-term memory Following Commands: Follows one step commands inconsistently;Follows one step commands with increased time Safety/Judgement: Decreased awareness of safety;Decreased awareness of  deficits Awareness: Intellectual Problem Solving: Slow processing;Decreased initiation;Difficulty sequencing;Requires verbal cues;Requires tactile cues                         Frequency  Min 2X/week        Progress Toward Goals  OT Goals(current goals can now be found in the care plan section)  Progress towards OT goals: Goals drowngraded-see care plan  Acute Rehab OT Goals Patient Stated Goal: none stated OT Goal Formulation: Patient unable to participate in goal setting Time For Goal Achievement: 10/13/20 Potential to Achieve Goals: Fair  Plan Discharge plan remains appropriate    Co-evaluation                 AM-PAC OT "6 Clicks" Daily Activity     Outcome Measure   Help from another person eating meals?: A Lot Help from another person taking care of personal grooming?: A Lot Help from another person toileting, which includes using toliet, bedpan, or urinal?: A Lot Help from another person bathing (including washing, rinsing, drying)?: A Lot Help from another person to put on and taking off regular upper body clothing?: A Lot Help from another person to put on and taking off regular lower body clothing?: A Lot 6 Click Score: 12    End of Session Equipment Utilized During Treatment: Gait belt  OT Visit Diagnosis: Other abnormalities of gait and mobility (R26.89);Unsteadiness on feet (R26.81);History of falling (Z91.81);Muscle weakness (generalized) (M62.81);Other symptoms and signs involving cognitive function   Activity Tolerance Patient limited by lethargy;Patient limited by fatigue   Patient Left in bed;with call bell/phone within reach;with bed alarm set   Nurse Communication Mobility status        Time: 1355-1414 OT Time Calculation (min): 19 min  Charges: OT General Charges $OT Visit: 1 Visit OT Treatments $Self Care/Home Management : 8-22 mins  09/29/2020  Rich, OTR/L  Acute Rehabilitation Services  Office:   [phone]    Suzanna Obey 09/29/2020, 2:36 PM

## 2020-10-22 DEATH — deceased

## 2020-10-26 ENCOUNTER — Telehealth: Payer: Medicare Other

## 2021-01-03 ENCOUNTER — Ambulatory Visit: Payer: Medicare Other | Admitting: Internal Medicine
